# Patient Record
Sex: Female | Born: 1940 | Race: White | Hispanic: No | Smoking: Former smoker
Health system: Southern US, Community
[De-identification: ages and names within clinical notes are randomized; demographics above are authoritative.]

## PROBLEM LIST (undated history)

## (undated) DIAGNOSIS — F329 Major depressive disorder, single episode, unspecified: Secondary | ICD-10-CM

## (undated) DIAGNOSIS — K769 Liver disease, unspecified: Secondary | ICD-10-CM

## (undated) DIAGNOSIS — F0390 Unspecified dementia without behavioral disturbance: Secondary | ICD-10-CM

## (undated) DIAGNOSIS — J449 Chronic obstructive pulmonary disease, unspecified: Secondary | ICD-10-CM

## (undated) DIAGNOSIS — F32A Depression, unspecified: Secondary | ICD-10-CM

## (undated) DIAGNOSIS — I10 Essential (primary) hypertension: Secondary | ICD-10-CM

## (undated) DIAGNOSIS — N189 Chronic kidney disease, unspecified: Secondary | ICD-10-CM

## (undated) HISTORY — DX: Chronic obstructive pulmonary disease, unspecified: J44.9

## (undated) HISTORY — DX: Depression, unspecified: F32.A

## (undated) HISTORY — DX: Chronic kidney disease, unspecified: N18.9

## (undated) HISTORY — DX: Liver disease, unspecified: K76.9

## (undated) HISTORY — PX: OTHER SURGICAL HISTORY: SHX169

## (undated) HISTORY — PX: CHOLECYSTECTOMY: SHX55

## (undated) HISTORY — PX: ABDOMINAL HYSTERECTOMY: SHX81

---
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
## 2018-04-16 ENCOUNTER — Emergency Department: Payer: Medicare HMO

## 2018-04-16 ENCOUNTER — Emergency Department
Admission: EM | Admit: 2018-04-16 | Discharge: 2018-04-16 | Disposition: A | Discharge status: Home / Self Care | Payer: Medicare HMO | Attending: Emergency Medicine | Admitting: Emergency Medicine

## 2018-04-16 ENCOUNTER — Other Ambulatory Visit: Payer: Self-pay

## 2018-04-16 DIAGNOSIS — I10 Essential (primary) hypertension: Secondary | ICD-10-CM | POA: Diagnosis not present

## 2018-04-16 DIAGNOSIS — R079 Chest pain, unspecified: Secondary | ICD-10-CM

## 2018-04-16 HISTORY — DX: Essential (primary) hypertension: I10

## 2018-04-16 LAB — CBC WITH DIFFERENTIAL/PLATELET
Abs Immature Granulocytes: 0.01 10*3/uL (ref 0.00–0.07)
BASOS ABS: 0 10*3/uL (ref 0.0–0.1)
Basophils Relative: 1 %
Eosinophils Absolute: 0.4 10*3/uL (ref 0.0–0.5)
Eosinophils Relative: 7 %
HCT: 42.4 % (ref 36.0–46.0)
Hemoglobin: 14 g/dL (ref 12.0–15.0)
Immature Granulocytes: 0 %
Lymphocytes Relative: 36 %
Lymphs Abs: 2.2 10*3/uL (ref 0.7–4.0)
MCH: 31.6 pg (ref 26.0–34.0)
MCHC: 33 g/dL (ref 30.0–36.0)
MCV: 95.7 fL (ref 80.0–100.0)
Monocytes Absolute: 0.5 10*3/uL (ref 0.1–1.0)
Monocytes Relative: 8 %
Neutro Abs: 3 10*3/uL (ref 1.7–7.7)
Neutrophils Relative %: 48 %
Platelets: 261 10*3/uL (ref 150–400)
RBC: 4.43 MIL/uL (ref 3.87–5.11)
RDW: 12.7 % (ref 11.5–15.5)
WBC: 6.2 10*3/uL (ref 4.0–10.5)
nRBC: 0 % (ref 0.0–0.2)

## 2018-04-16 LAB — BASIC METABOLIC PANEL
Anion gap: 9 (ref 5–15)
BUN: 17 mg/dL (ref 8–23)
CHLORIDE: 96 mmol/L — AB (ref 98–111)
CO2: 32 mmol/L (ref 22–32)
Calcium: 9.1 mg/dL (ref 8.9–10.3)
Creatinine, Ser: 0.67 mg/dL (ref 0.44–1.00)
GFR calc Af Amer: >60 mL/min (ref >60)
GFR calc non Af Amer: >60 mL/min (ref >60)
Glucose, Bld: 81 mg/dL (ref 70–99)
Potassium: 3.6 mmol/L (ref 3.5–5.1)
Sodium: 137 mmol/L (ref 135–145)

## 2018-04-16 LAB — TROPONIN I: Troponin I: <0.03 ng/mL (ref <0.03)

## 2018-04-16 MED ORDER — AMLODIPINE BESYLATE 5 MG PO TABS: 5.0000 mg | ORAL_TABLET | Freq: Every day | ORAL | 0 refills | Status: DC

## 2018-04-16 MED ORDER — AMLODIPINE BESYLATE 5 MG PO TABS
2.5000 mg | ORAL_TABLET | Freq: Once | ORAL | Status: AC
  Administered 2018-04-16: 2.5 mg via ORAL
  Filled 2018-04-16: qty 1

## 2018-04-16 NOTE — ED Notes (Signed)
Report given to Cole RN 

## 2018-04-16 NOTE — ED Provider Notes (Signed)
[facility] Provider Note  ____________________________________________   First MD Initiated Contact with Patient 04/16/18 1547     (approximate)  I have reviewed the triage vital signs and the nursing notes.   HISTORY  Chief Complaint Chest Pain   HPI Samantha Carpenter is a 78 y.o. female with a history of hypertension was presented to the emergency department today complaining of chest pain radiating to the left shoulder as well as the left side of the neck and jaw.  She states that the pain was severe and felt like a pressure that lasted for approximately 5 to 10 minutes.  She says that she is still having pain now approximate 1 to 2 hours after the initial episode but the pain is a 1 or 2 at this time.  Given 324 mg of aspirin in route to the hospital.  No nitroglycerin.  No history of CAD but patient says that there is a history of CAD in her family.  Denies any associated nausea, vomiting or shortness of breath.  Denies any alleviating or worsening factors such as exertion or deep breathing.   Past Medical History:  Diagnosis Date  . Hypertension     There are no active problems to display for this patient.   History reviewed. No pertinent surgical history.  Prior to Admission medications   Not on File    Allergies Patient has no allergy information on record.  No family history on file.  Social History Social History   Tobacco Use  . Smoking status: Not on file  Substance Use Topics  . Alcohol use: Not on file  . Drug use: Not on file    Review of Systems  Constitutional: No fever/chills Eyes: No visual changes. ENT: No sore throat. Cardiovascular: As above Respiratory: Denies shortness of breath. Gastrointestinal: No abdominal pain.  No nausea, no vomiting.  No diarrhea.  No constipation. Genitourinary: Negative for dysuria. Musculoskeletal: Negative for back pain. Skin: Negative for rash. Neurological:  Negative for headaches, focal weakness or numbness.   ____________________________________________   PHYSICAL EXAM:  VITAL SIGNS: ED Triage Vitals [04/16/18 1552]  Enc Vitals Group     BP (!) 223/87     Pulse Rate 70     Resp 19     Temp 98 F (36.7 C)     Temp Source Oral     SpO2 100 %     Weight 190 lb (86.2 kg)     Height 5\' 4"  (1.626 m)     Head Circumference      Peak Flow      Pain Score 2     Pain Loc      Pain Edu?      Excl. in GC?     Constitutional: Alert and oriented. Well appearing and in no acute distress. Eyes: Conjunctivae are normal.  Head: Atraumatic. Nose: No congestion/rhinnorhea. Mouth/Throat: Mucous membranes are moist.  Neck: No stridor.   Cardiovascular: Normal rate, regular rhythm. Grossly normal heart sounds.  Good peripheral circulation with equal and bilateral radial pulses. Respiratory: Normal respiratory effort.  No retractions. Lungs CTAB. Gastrointestinal: Soft and nontender. No distention. No CVA tenderness. Musculoskeletal: No lower extremity tenderness nor edema.  No joint effusions. Neurologic:  Normal speech and language. No gross focal neurologic deficits are appreciated. Skin:  Skin is warm, dry and intact. No rash noted. Psychiatric: Mood and affect are normal. Speech and behavior are normal.  ____________________________________________   LABS (all labs ordered  are listed, but only abnormal results are displayed)  Labs Reviewed  BASIC METABOLIC PANEL - Abnormal; Notable for the following components:      Result Value   Chloride 96 (*)    All other components within normal limits  CBC WITH DIFFERENTIAL/PLATELET  TROPONIN I  TROPONIN I   ____________________________________________  EKG  ED ECG REPORT I, Arelia Longest, the attending physician, personally viewed and interpreted this ECG.   Date: 04/16/2018  EKG Time: 1548  Rate: 69  Rhythm: normal sinus rhythm  Axis: Normal  Intervals:none  ST&T Change: No  ST segment elevation or depression.  T wave inversion in V2 as well as aVL. No previous on record for comparison ____________________________________________  RADIOLOGY  No active disease on chest x-ray. ____________________________________________   PROCEDURES  Procedure(s) performed:   Procedures  Critical Care performed:   ____________________________________________   INITIAL IMPRESSION / ASSESSMENT AND PLAN / ED COURSE  Pertinent labs & imaging results that were available during my care of the patient were reviewed by me and considered in my medical decision making (see chart for details).  Differential diagnosis includes, but is not limited to, ACS, aortic dissection, pulmonary embolism, cardiac tamponade, pneumothorax, pneumonia, pericarditis, myocarditis, GI-related causes including esophagitis/gastritis, and musculoskeletal chest wall pain.   As part of my medical decision making, I reviewed the following data within the electronic MEDICAL RECORD NUMBER Notes from prior ED visits  ----------------------------------------- 7:34 PM on 04/16/2018 -----------------------------------------  Patient at this time remains chest pain-free.  No dizziness.  However, blood pressure elevated to the 200s over 100s.  Patient says that she was just started several months ago on an ACE inhibitor as well as amlodipine 2.5 mg.  States that at that time her blood pressure was approximate 180 systolic.  Has not checked her blood pressure since then.  We will change her amlodipine to 5 mg and keep her ACE inhibitor at its same dose.  Recommended follow-up with primary care here as well as cardiology.  Patient understanding the diagnosis well treatment and willing to comply.  29- troponins here in the emergency department and remains asymptomatic. ____________________________________________   FINAL CLINICAL IMPRESSION(S) / ED DIAGNOSES  Hypertension.  Chest pain.  NEW MEDICATIONS STARTED  DURING THIS VISIT:  New Prescriptions   No medications on file     Note:  This document was prepared using Dragon voice recognition software and may include unintentional dictation errors.     Myrna Blazer, MD 04/16/18 [phone]

## 2018-04-16 NOTE — ED Notes (Signed)
Family at bedside and updated on plan of care for pt.

## 2018-04-16 NOTE — ED Triage Notes (Signed)
Pt comes via [facility] from shopping with c/o chest pain. Pt was out shopping and began to have some central chest pain that felt like pressure. Pt states it radiated to her left shoulder and jaw.  Pt stated 8/10 pain but was later 2/10. EMS reports pt was hypertensive.  Pt arrives alert. MD at bedside

## 2018-04-16 NOTE — ED Notes (Signed)
2nd trop sent to lab

## 2018-04-28 DIAGNOSIS — F3341 Major depressive disorder, recurrent, in partial remission: Secondary | ICD-10-CM | POA: Insufficient documentation

## 2018-04-28 DIAGNOSIS — F0390 Unspecified dementia without behavioral disturbance: Secondary | ICD-10-CM | POA: Insufficient documentation

## 2018-04-28 DIAGNOSIS — N183 Chronic kidney disease, stage 3 unspecified: Secondary | ICD-10-CM | POA: Insufficient documentation

## 2018-04-28 DIAGNOSIS — J431 Panlobular emphysema: Secondary | ICD-10-CM | POA: Insufficient documentation

## 2018-04-28 DIAGNOSIS — N1831 Chronic kidney disease, stage 3a: Secondary | ICD-10-CM | POA: Insufficient documentation

## 2018-04-28 DIAGNOSIS — I10 Essential (primary) hypertension: Secondary | ICD-10-CM | POA: Insufficient documentation

## 2018-06-09 DIAGNOSIS — E7800 Pure hypercholesterolemia, unspecified: Secondary | ICD-10-CM | POA: Insufficient documentation

## 2018-06-30 ENCOUNTER — Ambulatory Visit: Payer: Self-pay | Admitting: Psychiatry

## 2018-08-06 ENCOUNTER — Other Ambulatory Visit (HOSPITAL_COMMUNITY): Payer: Self-pay | Admitting: Neurology

## 2018-08-06 ENCOUNTER — Other Ambulatory Visit: Payer: Self-pay | Admitting: Neurology

## 2018-08-06 DIAGNOSIS — R413 Other amnesia: Secondary | ICD-10-CM

## 2018-08-15 ENCOUNTER — Ambulatory Visit: Payer: Self-pay

## 2018-08-22 ENCOUNTER — Ambulatory Visit: Payer: Self-pay

## 2018-08-26 ENCOUNTER — Ambulatory Visit: Payer: Self-pay

## 2018-09-01 ENCOUNTER — Ambulatory Visit
Admission: RE | Admit: 2018-09-01 | Discharge: 2018-09-01 | Disposition: A | Discharge status: Home / Self Care | Payer: Medicare HMO | Source: Ambulatory Visit | Attending: Neurology | Admitting: Neurology

## 2018-09-01 ENCOUNTER — Other Ambulatory Visit: Payer: Self-pay

## 2018-09-01 DIAGNOSIS — R413 Other amnesia: Secondary | ICD-10-CM | POA: Insufficient documentation

## 2018-09-02 ENCOUNTER — Encounter: Payer: Self-pay | Admitting: Psychiatry

## 2018-09-02 ENCOUNTER — Ambulatory Visit (INDEPENDENT_AMBULATORY_CARE_PROVIDER_SITE_OTHER): Payer: Medicare HMO | Admitting: Psychiatry

## 2018-09-02 DIAGNOSIS — F3341 Major depressive disorder, recurrent, in partial remission: Secondary | ICD-10-CM

## 2018-09-02 DIAGNOSIS — F09 Unspecified mental disorder due to known physiological condition: Secondary | ICD-10-CM
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
## 2018-10-09 ENCOUNTER — Telehealth (HOSPITAL_COMMUNITY): Payer: Self-pay | Admitting: Licensed Clinical Social Worker

## 2018-10-09 ENCOUNTER — Ambulatory Visit (HOSPITAL_COMMUNITY): Payer: Medicare HMO | Admitting: Licensed Clinical Social Worker

## 2018-10-09 NOTE — Telephone Encounter (Signed)
Samantha Carpenter did get a hold of the 10am patient. She stated she "did not know she had an appointment, she does not know who Dr. Shea Evans is, and she does not agree with there being a need for her to talk to anyone about anything."  Patient declined visit for today.

## 2018-11-13 ENCOUNTER — Other Ambulatory Visit: Payer: Self-pay | Admitting: Physician Assistant

## 2018-11-13 DIAGNOSIS — M5440 Lumbago with sciatica, unspecified side: Secondary | ICD-10-CM

## 2018-11-13 DIAGNOSIS — M79605 Pain in left leg: Secondary | ICD-10-CM

## 2018-11-13 DIAGNOSIS — S32030S Wedge compression fracture of third lumbar vertebra, sequela: Secondary | ICD-10-CM

## 2018-11-26 ENCOUNTER — Ambulatory Visit: Payer: Medicare HMO

## 2018-12-31 ENCOUNTER — Other Ambulatory Visit: Payer: Self-pay | Admitting: Internal Medicine

## 2018-12-31 DIAGNOSIS — R1084 Generalized abdominal pain: Secondary | ICD-10-CM

## 2019-05-10 ENCOUNTER — Emergency Department: Payer: Medicare HMO

## 2019-05-10 ENCOUNTER — Other Ambulatory Visit: Payer: Self-pay

## 2019-05-10 ENCOUNTER — Emergency Department
Admission: EM | Admit: 2019-05-10 | Discharge: 2019-05-11 | Disposition: A | Discharge status: Home / Self Care | Payer: Medicare HMO | Attending: Emergency Medicine | Admitting: Emergency Medicine

## 2019-05-10 DIAGNOSIS — M545 Low back pain: Secondary | ICD-10-CM | POA: Diagnosis present

## 2019-05-10 DIAGNOSIS — M5441 Lumbago with sciatica, right side: Secondary | ICD-10-CM | POA: Diagnosis not present

## 2019-05-10 DIAGNOSIS — Z87891 Personal history of nicotine dependence: Secondary | ICD-10-CM | POA: Diagnosis not present

## 2019-05-10 DIAGNOSIS — Z79899 Other long term (current) drug therapy: Secondary | ICD-10-CM | POA: Insufficient documentation

## 2019-05-10 DIAGNOSIS — J449 Chronic obstructive pulmonary disease, unspecified: Secondary | ICD-10-CM | POA: Diagnosis not present

## 2019-05-10 DIAGNOSIS — N183 Chronic kidney disease, stage 3 unspecified: Secondary | ICD-10-CM | POA: Insufficient documentation

## 2019-05-10 DIAGNOSIS — Z91040 Latex allergy status: Secondary | ICD-10-CM | POA: Diagnosis not present

## 2019-05-10 DIAGNOSIS — Z7982 Long term (current) use of aspirin: Secondary | ICD-10-CM | POA: Insufficient documentation

## 2019-05-10 DIAGNOSIS — F0390 Unspecified dementia without behavioral disturbance: Secondary | ICD-10-CM | POA: Diagnosis not present

## 2019-05-10 DIAGNOSIS — I129 Hypertensive chronic kidney disease with stage 1 through stage 4 chronic kidney disease, or unspecified chronic kidney disease: Secondary | ICD-10-CM | POA: Insufficient documentation

## 2019-05-10 DIAGNOSIS — M5442 Lumbago with sciatica, left side: Secondary | ICD-10-CM

## 2019-05-10 MED ORDER — KETOROLAC TROMETHAMINE 30 MG/ML IJ SOLN
10.0000 mg | Freq: Once | INTRAMUSCULAR | Status: AC
  Administered 2019-05-10: 9.9 mg via INTRAVENOUS
  Filled 2019-05-10: qty 1

## 2019-05-10 MED ORDER — MORPHINE SULFATE (PF) 2 MG/ML IV SOLN
INTRAVENOUS | Status: AC
  Filled 2019-05-10: qty 1

## 2019-05-10 MED ORDER — MORPHINE SULFATE (PF) 2 MG/ML IV SOLN
2.0000 mg | Freq: Once | INTRAVENOUS | Status: AC
  Administered 2019-05-10: 23:00:00 2 mg via INTRAVENOUS

## 2019-05-10 MED ORDER — HYDROCODONE-ACETAMINOPHEN 5-325 MG PO TABS
1.0000 | ORAL_TABLET | Freq: Once | ORAL | Status: DC
  Filled 2019-05-10: qty 1

## 2019-05-10 NOTE — ED Triage Notes (Addendum)
Patient coming [facility] from home for lower back and left hip pain X 2-3 days. Patient describes pain as sharp/shooting going down leg. Patient has hx of multiple falls, last fall occurring 2-3 weeks ago. Patients 25 mcg fentanyl in transit. Patient's pain decreased form 10 to 5 of 10 after fentanyl. Patient hypertensive for EMS, systolic over 200.   Patient reports she just recently moved and has been lifting heavy objects (computers). Patient reports previous hx of back pain similar to this after lifting heavy objects in the past.

## 2019-05-10 NOTE — ED Notes (Signed)
Pt to DG  Pt refused norco d/t "having to go in to the hospital to get off of it"  Pt agrees to asking EDP for something else  Dr Dolores Frame notifed

## 2019-05-10 NOTE — ED Provider Notes (Signed)
[facility] Emergency Department Provider Note   ____________________________________________   First MD Initiated Contact with Patient 05/10/19 2340     (approximate)  I have reviewed the triage vital signs and the nursing notes.   HISTORY  Chief Complaint Back Pain    HPI Samantha Carpenter is a 79 y.o. female brought to the ED from home via EMS with a chief complaint of lower back and left hip pain x2 to 3 days.  Patient denies recent fall/trauma/injury.  States she did fall 3 weeks ago but "it had nothing to do with my back".  Reports she has been moving and lifting heavy boxes.  Describes pain starting in the lower back shooting down her left lower leg.  Denies associated extremity weakness, numbness/tingling, bowel or bladder incontinence.  Given fentanyl in route which improved her pain from 10/10 to 5/10.  Denies anticoagulant use.       Past Medical History:  Diagnosis Date  . Chronic kidney disease   . COPD (chronic obstructive pulmonary disease) ([place])   . Depression   . Hypertension   . Liver disease     Patient Active Problem List   Diagnosis Date Noted  . Pure hypercholesterolemia 06/09/2018  . Recurrent major depressive disorder, in partial remission ([place]) 04/28/2018  . Panlobular emphysema ([place]) 04/28/2018  . HTN, goal below 140/80 04/28/2018  . Dementia, senile, without behavioral disturbance ([place]) 04/28/2018  . CKD (chronic kidney disease) stage 3, GFR 30-59 ml/min 04/28/2018    Past Surgical History:  Procedure Laterality Date  . ABDOMINAL HYSTERECTOMY    . CHOLECYSTECTOMY    . face tuck      Prior to Admission medications   Medication Sig Start Date End Date Taking? Authorizing Provider  acyclovir (ZOVIRAX) 800 MG tablet Take by mouth. 06/18/18   [provider]  amLODipine (NORVASC) 5 MG tablet Take 1 tablet (5 mg total) by mouth daily for 30 days. 04/16/18 05/16/18  Schaevitz, Randall An, MD  aspirin EC 81 MG  tablet Take by mouth.    [provider]  bisoprolol (ZEBETA) 5 MG tablet Take by mouth. 04/28/18 04/28/19  [provider]  clonazePAM Bobbye Charleston) 1 MG tablet Take by mouth. 05/08/18   [provider]  clotrimazole-betamethasone (LOTRISONE) cream Apply topically. 08/29/18 08/29/19  [provider]  DULoxetine (CYMBALTA) 60 MG capsule Take by mouth. 08/25/18   [provider]  gabapentin (NEURONTIN) 100 MG capsule Take by mouth. 06/18/18   [provider]  irbesartan (AVAPRO) 300 MG tablet Take by mouth. 04/28/18 04/28/19  [provider]  memantine (NAMENDA) 10 MG tablet Take by mouth. 06/18/18   [provider]  OXcarbazepine (TRILEPTAL) 150 MG tablet 1 po qam qnd 2 po qhs 06/12/18   [provider]    Allergies Morphine and related and Latex  Family History  Problem Relation Age of Onset  . Bipolar disorder Brother   . Schizophrenia Brother   . Bipolar disorder Sister   . Schizophrenia Sister   . Drug abuse Son     Social History Social History   Tobacco Use  . Smoking status: Former Smoker    Quit date: 09/01/2009    Years since quitting: 9.6  . Smokeless tobacco: Never Used  Substance Use Topics  . Alcohol use: Never  . Drug use: Not Currently    Review of Systems  Constitutional: No fever/chills Eyes: No visual changes. ENT: No sore throat. Cardiovascular: Denies chest pain. Respiratory: Denies shortness  of breath. Gastrointestinal: No abdominal pain.  No nausea, no vomiting.  No diarrhea.  No constipation. Genitourinary: Negative for dysuria. Musculoskeletal: Positive for back pain. Skin: Negative for rash. Neurological: Negative for headaches, focal weakness or numbness.   ____________________________________________   PHYSICAL EXAM:  VITAL SIGNS: ED Triage Vitals  Enc Vitals Group     BP 05/10/19 2154 (!) 191/58     Pulse Rate 05/10/19 2154 (!) 58     Resp 05/10/19 2154 17     Temp  05/10/19 2154 97.6 F (36.4 C)     Temp src --      SpO2 05/10/19 2154 95 %     Weight 05/10/19 2149 190 lb 0.6 oz (86.2 kg)     Height 05/10/19 2149 5\' 4"  (1.626 m)     Head Circumference --      Peak Flow --      Pain Score 05/10/19 2149 5     Pain Loc --      Pain Edu? --      Excl. in GC? --     Constitutional: Alert and oriented. Well appearing and in mild acute distress. Eyes: Conjunctivae are normal. PERRL. EOMI. Head: Atraumatic. Nose: Atraumatic. Mouth/Throat: Mucous membranes are moist.  Atraumatic. Neck: No stridor.  No cervical spine tenderness to palpation. Cardiovascular: Normal rate, regular rhythm. Grossly normal heart sounds.  Good peripheral circulation. Respiratory: Normal respiratory effort.  No retractions. Lungs CTAB. Gastrointestinal: Soft and nontender to light or deep palpation. No distention. No abdominal bruits. No CVA tenderness. Musculoskeletal: No spinal tenderness to palpation.  Left buttocks tender to palpation.  Negative straight leg raise.  No lower extremity tenderness nor edema.  No joint effusions. Neurologic:  Normal speech and language. No gross focal neurologic deficits are appreciated.  5/5 motor strength and sensation all extremities. Skin:  Skin is warm, dry and intact. No rash noted. Psychiatric: Mood and affect are normal. Speech and behavior are normal.  ____________________________________________   LABS (all labs ordered are listed, but only abnormal results are displayed)  Labs Reviewed  URINALYSIS, COMPLETE (UACMP) WITH MICROSCOPIC   ____________________________________________  EKG  None ____________________________________________  RADIOLOGY  ED MD interpretation: No acute bony abnormalities  Official radiology report(s): DG Lumbar Spine Complete  Result Date: 05/11/2019 CLINICAL DATA:  Lower back pain for 3 days, lower extremity radiculopathy, multiple falls EXAM: LUMBAR SPINE - COMPLETE 4+ VIEW COMPARISON:   11/04/2018 report only FINDINGS: Frontal, bilateral oblique, and lateral views of the lumbar spine are obtained. Chronic compression deformity superior endplate L3 with less than 25% loss of height. No acute bony abnormalities. Prominent spondylosis at L4-5 and L5-S1, with associated facet hypertrophic changes at those levels. Alignment is grossly anatomic.  Sacroiliac joints are normal. IMPRESSION: 1. Chronic L3 compression deformity. 2. No acute bony abnormalities. 3. Prominent lower lumbar spondylosis greatest at the lumbosacral junction. Electronically Signed   By: 11/06/2018 M.D.   On: 05/11/2019 00:21    ____________________________________________   PROCEDURES  Procedure(s) performed (including Critical Care):  Procedures   ____________________________________________   INITIAL IMPRESSION / ASSESSMENT AND PLAN / ED COURSE  As part of my medical decision making, I reviewed the following data within the electronic MEDICAL RECORD NUMBER Nursing notes reviewed and incorporated, Old chart reviewed, Radiograph reviewed, Notes from prior ED visits and [place] Controlled Substance Database     Samantha Carpenter was evaluated in Emergency Department on 05/11/2019 for the symptoms described in the history of present illness. She was evaluated  in the context of the global COVID-19 pandemic, which necessitated consideration that the patient might be at risk for infection with the SARS-CoV-2 virus that causes COVID-19. Institutional protocols and algorithms that pertain to the evaluation of patients at risk for COVID-19 are in a state of rapid change based on information released by regulatory bodies including the CDC and federal and state organizations. These policies and algorithms were followed during the patient's care in the ED.    79 year old female who presents with low back/left hip pain consistent with sciatica.  Will administer low-dose NSAID, oral analgesic.  X-ray lower back and  reassess.  Clinical Course as of May 11 47  Mon May 11, 2019  0932 Updated patient on x-ray results.  Patient feeling better.  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  We will also apply lidocaine patch.  Reading her chart, it looks like her PCP filled her prescription for Mobic this month.  Instructed her to continue taking Mobic for anti-inflammatory properties.  Will follow-up with orthopedics as needed.  Strict return precautions given.  Patient verbalizes understanding and agrees with plan of care.   [JS]    Clinical Course User Index [JS] Irean Hong, MD     ____________________________________________   FINAL CLINICAL IMPRESSION(S) / ED DIAGNOSES  Final diagnoses:  Acute midline low back pain with left-sided sciatica     ED Discharge Orders    None       Note:  This document was prepared using Dragon voice recognition software and may include unintentional dictation errors.   Irean Hong, MD 05/11/19 [phone]

## 2019-05-10 NOTE — ED Notes (Signed)
Pt reports pain since 1973, but the pills that pt has been taking over the last 24 hours hasn't helped the pain in the left hip, pain is "running down to the left foot"  10/10 pain in left hip "feels like it's breaking when I walk on it"  "swelling in my left knee" - left upper outer knee appears swollen  Creme, patches, "8 hour arthritis pain medicine"

## 2019-05-10 NOTE — ED Notes (Signed)
Pt given pillow and warm blanket; pt reports "other hospitals put my pocketbook in a safe", pt asked to keep pocketbook by her side for security and simplicity

## 2019-05-11 MED ORDER — TRAMADOL HCL 50 MG PO TABS
50.0000 mg | ORAL_TABLET | Freq: Once | ORAL | Status: AC
  Administered 2019-05-11: 50 mg via ORAL
  Filled 2019-05-11: qty 1

## 2019-05-11 MED ORDER — ONDANSETRON HCL 4 MG/2ML IJ SOLN
4.0000 mg | Freq: Once | INTRAMUSCULAR | Status: AC
  Administered 2019-05-11: 02:00:00 4 mg via INTRAVENOUS
  Filled 2019-05-11: qty 2

## 2019-05-11 MED ORDER — LIDOCAINE 5 % EX PTCH: 1.0000 | MEDICATED_PATCH | CUTANEOUS | 0 refills | Status: DC

## 2019-05-11 MED ORDER — LIDOCAINE 5 % EX PTCH
1.0000 | MEDICATED_PATCH | CUTANEOUS | Status: DC
  Administered 2019-05-11: 1 via TRANSDERMAL
  Filled 2019-05-11: qty 1

## 2019-05-11 MED ORDER — TRAMADOL HCL 50 MG PO TABS: 50.0000 mg | ORAL_TABLET | Freq: Four times a day (QID) | ORAL | 0 refills | Status: DC | PRN

## 2019-05-11 NOTE — ED Notes (Signed)
Pt returned from DG.  

## 2019-05-11 NOTE — Discharge Instructions (Addendum)
1.  You may take Tramadol as needed for pain. 2.  Apply Lidocaine patch to affected area as instructed. 3.  You may alternate heat or ice as needed for relief of symptoms. 4.  Use your walker to help you balance as you walk. 5.  Return to the ER for worsening symptoms, persistent vomiting, difficulty breathing or other concerns.

## 2019-05-11 NOTE — ED Notes (Signed)
Peripheral IV discontinued. Catheter intact. No signs of infiltration or redness. Gauze applied to IV site.    Discharge instructions reviewed with patient. Questions fielded by this RN. Patient verbalizes understanding of instructions. Patient discharged home in stable condition per sung. No acute distress noted at time of discharge.   Pt leaving with EMS on stretcher

## 2019-05-11 NOTE — ED Notes (Signed)
Alternate transport called for but not available

## 2019-05-11 NOTE — ED Notes (Addendum)
Pt reports sister sleeping and neighbors watching pet; requests EMS to return home; EDP notified  Pt to door for toilet use; curtain closed and pt toileted alone; ambulatory in room without assistance

## 2019-05-27 ENCOUNTER — Other Ambulatory Visit: Payer: Self-pay | Admitting: Internal Medicine

## 2019-05-27 DIAGNOSIS — S32030A Wedge compression fracture of third lumbar vertebra, initial encounter for closed fracture: Secondary | ICD-10-CM

## 2019-05-28 ENCOUNTER — Other Ambulatory Visit: Payer: Self-pay

## 2019-05-28 ENCOUNTER — Encounter: Payer: Self-pay | Admitting: Emergency Medicine

## 2019-05-28 ENCOUNTER — Emergency Department
Admission: EM | Admit: 2019-05-28 | Discharge: 2019-05-29 | Disposition: A | Discharge status: Home / Self Care | Payer: Medicare HMO | Attending: Emergency Medicine | Admitting: Emergency Medicine

## 2019-05-28 ENCOUNTER — Emergency Department: Payer: Medicare HMO

## 2019-05-28 DIAGNOSIS — Z7982 Long term (current) use of aspirin: Secondary | ICD-10-CM | POA: Insufficient documentation

## 2019-05-28 DIAGNOSIS — R0602 Shortness of breath: Secondary | ICD-10-CM | POA: Diagnosis not present

## 2019-05-28 DIAGNOSIS — N183 Chronic kidney disease, stage 3 unspecified: Secondary | ICD-10-CM | POA: Diagnosis not present

## 2019-05-28 DIAGNOSIS — F0390 Unspecified dementia without behavioral disturbance: Secondary | ICD-10-CM | POA: Diagnosis not present

## 2019-05-28 DIAGNOSIS — Z79899 Other long term (current) drug therapy: Secondary | ICD-10-CM | POA: Diagnosis not present

## 2019-05-28 DIAGNOSIS — I129 Hypertensive chronic kidney disease with stage 1 through stage 4 chronic kidney disease, or unspecified chronic kidney disease: Secondary | ICD-10-CM | POA: Diagnosis not present

## 2019-05-28 DIAGNOSIS — M79605 Pain in left leg: Secondary | ICD-10-CM

## 2019-05-28 DIAGNOSIS — Z87891 Personal history of nicotine dependence: Secondary | ICD-10-CM | POA: Diagnosis not present

## 2019-05-28 DIAGNOSIS — Z91040 Latex allergy status: Secondary | ICD-10-CM | POA: Diagnosis not present

## 2019-05-28 DIAGNOSIS — J449 Chronic obstructive pulmonary disease, unspecified: Secondary | ICD-10-CM | POA: Diagnosis not present

## 2019-05-28 LAB — CBC WITH DIFFERENTIAL/PLATELET
Abs Immature Granulocytes: 0.02 10*3/uL (ref 0.00–0.07)
Basophils Absolute: 0 10*3/uL (ref 0.0–0.1)
Basophils Relative: 1 %
Eosinophils Absolute: 0.4 10*3/uL (ref 0.0–0.5)
Eosinophils Relative: 5 %
HCT: 41.9 % (ref 36.0–46.0)
Hemoglobin: 13.9 g/dL (ref 12.0–15.0)
Immature Granulocytes: 0 %
Lymphocytes Relative: 21 %
Lymphs Abs: 1.5 10*3/uL (ref 0.7–4.0)
MCH: 32.1 pg (ref 26.0–34.0)
MCHC: 33.2 g/dL (ref 30.0–36.0)
MCV: 96.8 fL (ref 80.0–100.0)
Monocytes Absolute: 0.4 10*3/uL (ref 0.1–1.0)
Monocytes Relative: 5 %
Neutro Abs: 5 10*3/uL (ref 1.7–7.7)
Neutrophils Relative %: 68 %
Platelets: 280 10*3/uL (ref 150–400)
RBC: 4.33 MIL/uL (ref 3.87–5.11)
RDW: 12.5 % (ref 11.5–15.5)
WBC: 7.3 10*3/uL (ref 4.0–10.5)
nRBC: 0 % (ref 0.0–0.2)

## 2019-05-28 LAB — BASIC METABOLIC PANEL
Anion gap: 8 (ref 5–15)
BUN: 13 mg/dL (ref 8–23)
CO2: 27 mmol/L (ref 22–32)
Calcium: 8.5 mg/dL — ABNORMAL LOW (ref 8.9–10.3)
Chloride: 104 mmol/L (ref 98–111)
Creatinine, Ser: 0.7 mg/dL (ref 0.44–1.00)
GFR calc Af Amer: >60 mL/min (ref >60)
GFR calc non Af Amer: >60 mL/min (ref >60)
Glucose, Bld: 151 mg/dL — ABNORMAL HIGH (ref 70–99)
Potassium: 3.6 mmol/L (ref 3.5–5.1)
Sodium: 139 mmol/L (ref 135–145)

## 2019-05-28 LAB — TROPONIN I (HIGH SENSITIVITY)
Troponin I (High Sensitivity): 10 ng/L (ref <18)
Troponin I (High Sensitivity): 18 ng/L — ABNORMAL HIGH (ref <18)

## 2019-05-28 MED ORDER — TRAMADOL HCL 50 MG PO TABS
50.0000 mg | ORAL_TABLET | Freq: Once | ORAL | Status: DC
  Filled 2019-05-28: qty 1

## 2019-05-28 MED ORDER — ACETAMINOPHEN 500 MG PO TABS
1000.0000 mg | ORAL_TABLET | Freq: Once | ORAL | Status: AC
  Administered 2019-05-28: 1000 mg via ORAL

## 2019-05-28 MED ORDER — HALOPERIDOL LACTATE 5 MG/ML IJ SOLN
2.0000 mg | Freq: Once | INTRAMUSCULAR | Status: AC
  Administered 2019-05-28: 2 mg via INTRAVENOUS
  Filled 2019-05-28: qty 1

## 2019-05-28 MED ORDER — ACETAMINOPHEN 500 MG PO TABS
ORAL_TABLET | ORAL | Status: AC
  Filled 2019-05-28: qty 2

## 2019-05-28 NOTE — ED Notes (Signed)
MD at bedside. 

## 2019-05-28 NOTE — ED Notes (Signed)
purwick placed on pt 

## 2019-05-28 NOTE — ED Notes (Signed)
Pt c/o left leg pain. Pt states pain radiates down to the left leg from the lumber spine. Pt states leg has been hurting for 3 or 4 weeks. Pt denies falling or injury. Pt states she has been taking her pain meds from ED visit 2 or 3 weeks ago and only had a couple of pills left and pharmacy told her to call 911 or go to walkin clinic.

## 2019-05-28 NOTE — ED Notes (Signed)
Pt up to use room toilet. Pt moain in pain when askes what hurts pt stated "a lot" pt back in bed resting

## 2019-05-28 NOTE — ED Triage Notes (Addendum)
Pt arrival via [facility] from home. Pt called out due to chest pain, but when EMS arrived patient denies chest pain and instead is currently having leg pain and hip pain from a fall a few days ago.   When asked why the patient was brought here, patient states she 'might be having a heart attack or stroke' and that she 'just doesn't feel right'. Pt is a&4 but states she has dementia.  EMS vitals- BP 226/91 BS- 274

## 2019-05-28 NOTE — ED Notes (Signed)
Pts IV removed, catheter intact. Pt requiring EMS transport home, pts sister called but states she is unable to transport pt. Pt states 2 weeks ago she required ems transportation home as well, pt aware of cost. Pt a&o x4 at time of discharge, pt ambulating to bedside toilet at this time

## 2019-05-28 NOTE — ED Notes (Signed)
Pt moaning and calling out in pain, MD made aware. Pt provided with sisters phone number to contact family member

## 2019-05-28 NOTE — ED Provider Notes (Signed)
[facility] Emergency Department Provider Note  ____________________________________________   I have reviewed the triage vital signs and the nursing notes.   HISTORY  Chief Complaint Shortness of breath  History limited by: Not Limited   HPI Samantha Carpenter is a 79 y.o. female who presents to the emergency department today because of concern for shortness of breath. The patient states she was on the phone with the pharmacy inquiring about refill for her narcotic pain medication that was prescribed for left leg pain, when she started having shortness of breath. Apparently she also complained of chest pain to the pharmacist over the phone. At that time they recommended the patient come to the emergency department. At the time of my exam the patient denies any chest pain or shortness of breath. She does have complaint of some leg pain which she says is being worked up as an outpatient.    Records reviewed. Per medical record review patient has a history of COPD, depression, HTN. ER visit last month for left sided leg pain. Has followed up with PCP who has ordered MRI.   Past Medical History:  Diagnosis Date  . Chronic kidney disease   . COPD (chronic obstructive pulmonary disease) ([facility])   . Depression   . Hypertension   . Liver disease     Patient Active Problem List   Diagnosis Date Noted  . Pure hypercholesterolemia 06/09/2018  . Recurrent major depressive disorder, 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  . 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 HTN, goal below 140/80 04/28/2018  . Dementia, senile, without behavioral disturbance ([facility]) 04/28/2018  . CKD (chronic kidney disease) stage 3, GFR 30-59 ml/min 04/28/2018    Past Surgical History:  Procedure Laterality Date  . ABDOMINAL HYSTERECTOMY    . CHOLECYSTECTOMY    . face tuck      Prior to Admission medications   Medication Sig Start Date End Date Taking? Authorizing Provider  acyclovir  (ZOVIRAX) 800 MG tablet Take by mouth. 06/18/18   [provider]  amLODipine (NORVASC) 5 MG tablet Take 1 tablet (5 mg total) by mouth daily for 30 days. 04/16/18 05/16/18  Schaevitz, Myra Rude, MD  aspirin EC 81 MG tablet Take by mouth.    [provider]  bisoprolol (ZEBETA) 5 MG tablet Take by mouth. 04/28/18 04/28/19  [provider]  clonazePAM Scarlette Calico) 1 MG tablet Take by mouth. 05/08/18   [provider]  clotrimazole-betamethasone (LOTRISONE) cream Apply topically. 08/29/18 08/29/19  [provider]  DULoxetine (CYMBALTA) 60 MG capsule Take by mouth. 08/25/18   [provider]  gabapentin (NEURONTIN) 100 MG capsule Take by mouth. 06/18/18   [provider]  irbesartan (AVAPRO) 300 MG tablet Take by mouth. 04/28/18 04/28/19  [provider]  lidocaine (LIDODERM) 5 % Place 1 patch onto the skin daily. Remove & Discard patch within 12 hours or as directed by MD 05/11/19   Irean Hong, MD  memantine (NAMENDA) 10 MG tablet Take by mouth. 06/18/18   [provider]  OXcarbazepine (TRILEPTAL) 150 MG tablet 1 po qam qnd 2 po qhs 06/12/18   [provider]  traMADol (ULTRAM) 50 MG tablet Take 1 tablet (50 mg total) by mouth every 6 (six) hours as needed. 05/11/19   Irean Hong, MD    Allergies Morphine and related and Latex  Family History  Problem Relation Age of Onset  . Bipolar disorder Brother   . Schizophrenia Brother   . Bipolar disorder  Sister   . Schizophrenia Sister   . Drug abuse Son     Social History Social History   Tobacco Use  . Smoking status: Former Smoker    Quit date: 09/01/2009    Years since quitting: 9.7  . Smokeless tobacco: Never Used  Substance Use Topics  . Alcohol use: Never  . Drug use: Not Currently    Review of Systems Constitutional: No fever/chills Eyes: No visual changes. ENT: No sore throat. Cardiovascular: Positive for chest pain now resolved Respiratory: Positive  for shortness of breath now resolved.  Gastrointestinal: No abdominal pain.  No nausea, no vomiting.  No diarrhea.   Genitourinary: Negative for dysuria. Musculoskeletal: Positive for left leg pain. Skin: Negative for rash. Neurological: Negative for headaches, focal weakness or numbness.  ____________________________________________   PHYSICAL EXAM:  VITAL SIGNS: ED Triage Vitals  Enc Vitals Group     BP 05/28/19 1822 (!) 197/73     Pulse Rate 05/28/19 1822 71     Resp 05/28/19 1822 15     Temp 05/28/19 1820 98.6 F (37 C)     Temp Source 05/28/19 1820 Oral     SpO2 05/28/19 1822 100 %     Weight 05/28/19 1820 189 lb 9.5 oz (86 kg)     Height 05/28/19 1820 5\' 4"  (1.626 m)     Head Circumference --      Peak Flow --      Pain Score 05/28/19 1820 10   Constitutional: Alert and oriented.  Eyes: Conjunctivae are normal.  ENT      Head: Normocephalic and atraumatic.      Nose: No congestion/rhinnorhea.      Mouth/Throat: Mucous membranes are moist.      Neck: No stridor. Hematological/Lymphatic/Immunilogical: No cervical lymphadenopathy. Cardiovascular: Normal rate, regular rhythm.  No murmurs, rubs, or gallops.  Respiratory: Normal respiratory effort without tachypnea nor retractions. Breath sounds are clear and equal bilaterally. No wheezes/rales/rhonchi. Gastrointestinal: Soft and non tender. No rebound. No guarding.  Genitourinary: Deferred Musculoskeletal: Some discomfort with movement of the left leg.  Neurologic:  Normal speech and language. No gross focal neurologic deficits are appreciated.  Skin:  Skin is warm, dry and intact. No rash noted. Psychiatric: Mood and affect are normal. Speech and behavior are normal. Patient exhibits appropriate insight and judgment.  ____________________________________________    LABS (pertinent positives/negatives)  Trop hs 10 ->18 BMP wnl except glu 151, ca 8.5 CBC wbc 7.3, hgb 13.9, plt  280  ____________________________________________   EKG  I, 07/28/19, attending physician, personally viewed and interpreted this EKG  EKG Time: 1820 Rate: 74 Rhythm: sinus rhythm Axis: normal Intervals: qtc 463 QRS: narrow ST changes: no st elevation Impression: normal ekg ____________________________________________    RADIOLOGY  CXR Chronic hyperinflation and coarse interstitial markings. Atelectasis  ____________________________________________   PROCEDURES  Procedures  ____________________________________________   INITIAL IMPRESSION / ASSESSMENT AND PLAN / ED COURSE  Pertinent labs & imaging results that were available during my care of the patient were reviewed by me and considered in my medical decision making (see chart for details).   Patient presented to the emergency department today because of concerns for shortness of breath.  She states that this occurred while she was talking on the phone with the pharmacist about her narcotic medication.  By the time my exam the patient denies any shortness of breath.  She also denies any chest pain although states she says she told the pharmacist about chest pain.  EKG here without concerning findings.  Troponin was checked twice and went from 10-18.  Will I do have a low suspicion for cardiac etiology given the patient's history I did discuss this finding with the patient.  I did offer to keep patient here in the hospital for further work-up.  However the patient stated she would like to be discharged home.  Given that the patient is completely asymptomatic in terms of any shortness of breath or chest pain I do not think this is completely unreasonable.  I discussed with the patient the importance of following up with her primary care doctor tomorrow.  Additionally the patient did have pain of her left leg here.  She has been seen in the emergency department for this in the past and is currently being worked up for it  with her primary care.    ____________________________________________   FINAL CLINICAL IMPRESSION(S) / ED DIAGNOSES  Final diagnoses:  Shortness of breath  Left leg pain     Note: This dictation was prepared with Dragon dictation. Any transcriptional errors that result from this process are unintentional     Nance Pear, MD 05/28/19 2253

## 2019-05-28 NOTE — Discharge Instructions (Addendum)
As we discussed it is very important that you follow up closely with your doctor. Please return for any further shortness of breath, any chest pain, fevers, significant cough or any other new or concerning symptoms.

## 2019-06-06 ENCOUNTER — Ambulatory Visit
Admission: RE | Admit: 2019-06-06 | Discharge: 2019-06-06 | Disposition: A | Discharge status: Home / Self Care | Payer: Medicare HMO | Source: Ambulatory Visit | Attending: Internal Medicine | Admitting: Internal Medicine

## 2019-06-06 ENCOUNTER — Other Ambulatory Visit: Payer: Self-pay

## 2019-06-06 DIAGNOSIS — S32030A Wedge compression fracture of third lumbar vertebra, initial encounter for closed fracture: Secondary | ICD-10-CM | POA: Diagnosis present

## 2019-11-19 ENCOUNTER — Encounter: Payer: Self-pay | Admitting: *Deleted

## 2019-11-19 ENCOUNTER — Emergency Department: Payer: Medicare HMO

## 2019-11-19 ENCOUNTER — Other Ambulatory Visit: Payer: Self-pay

## 2019-11-19 DIAGNOSIS — I700 Atherosclerosis of aorta: Secondary | ICD-10-CM | POA: Diagnosis not present

## 2019-11-19 DIAGNOSIS — Z91040 Latex allergy status: Secondary | ICD-10-CM | POA: Insufficient documentation

## 2019-11-19 DIAGNOSIS — E7800 Pure hypercholesterolemia, unspecified: Secondary | ICD-10-CM | POA: Diagnosis not present

## 2019-11-19 DIAGNOSIS — K219 Gastro-esophageal reflux disease without esophagitis: Secondary | ICD-10-CM | POA: Insufficient documentation

## 2019-11-19 DIAGNOSIS — Y9389 Activity, other specified: Secondary | ICD-10-CM | POA: Diagnosis not present

## 2019-11-19 DIAGNOSIS — Z7982 Long term (current) use of aspirin: Secondary | ICD-10-CM | POA: Diagnosis not present

## 2019-11-19 DIAGNOSIS — I10 Essential (primary) hypertension: Secondary | ICD-10-CM | POA: Insufficient documentation

## 2019-11-19 DIAGNOSIS — Z79899 Other long term (current) drug therapy: Secondary | ICD-10-CM | POA: Insufficient documentation

## 2019-11-19 DIAGNOSIS — K769 Liver disease, unspecified: Secondary | ICD-10-CM | POA: Diagnosis not present

## 2019-11-19 DIAGNOSIS — Z7951 Long term (current) use of inhaled steroids: Secondary | ICD-10-CM | POA: Diagnosis not present

## 2019-11-19 DIAGNOSIS — N189 Chronic kidney disease, unspecified: Secondary | ICD-10-CM | POA: Insufficient documentation

## 2019-11-19 DIAGNOSIS — Z9049 Acquired absence of other specified parts of digestive tract: Secondary | ICD-10-CM | POA: Diagnosis not present

## 2019-11-19 DIAGNOSIS — F3341 Major depressive disorder, recurrent, in partial remission: Secondary | ICD-10-CM | POA: Diagnosis not present

## 2019-11-19 DIAGNOSIS — T18128A Food in esophagus causing other injury, initial encounter: Secondary | ICD-10-CM | POA: Diagnosis not present

## 2019-11-19 DIAGNOSIS — J449 Chronic obstructive pulmonary disease, unspecified: Secondary | ICD-10-CM | POA: Insufficient documentation

## 2019-11-19 DIAGNOSIS — Z20822 Contact with and (suspected) exposure to covid-19: Secondary | ICD-10-CM | POA: Diagnosis not present

## 2019-11-19 DIAGNOSIS — F0390 Unspecified dementia without behavioral disturbance: Secondary | ICD-10-CM | POA: Insufficient documentation

## 2019-11-19 DIAGNOSIS — Z87891 Personal history of nicotine dependence: Secondary | ICD-10-CM | POA: Insufficient documentation

## 2019-11-19 DIAGNOSIS — I129 Hypertensive chronic kidney disease with stage 1 through stage 4 chronic kidney disease, or unspecified chronic kidney disease: Secondary | ICD-10-CM | POA: Insufficient documentation

## 2019-11-19 DIAGNOSIS — Z885 Allergy status to narcotic agent status: Secondary | ICD-10-CM | POA: Diagnosis not present

## 2019-11-19 DIAGNOSIS — X58XXXA Exposure to other specified factors, initial encounter: Secondary | ICD-10-CM | POA: Diagnosis not present

## 2019-11-19 DIAGNOSIS — T18108A Unspecified foreign body in esophagus causing other injury, initial encounter: Secondary | ICD-10-CM | POA: Diagnosis present

## 2019-11-19 NOTE — ED Triage Notes (Signed)
First Nurse NOte:  Arrives via [facility] for ED evaluation of possible chicken bone in throat.  Voice clear and strong.  Sats 98%

## 2019-11-19 NOTE — ED Triage Notes (Signed)
Pt brought in via ems from home with a chicken bone hung in her throat.  Pt reports pain when swallowing.  No acute resp distress   Pt alert.

## 2019-11-20 ENCOUNTER — Emergency Department: Payer: Medicare HMO

## 2019-11-20 ENCOUNTER — Ambulatory Visit
Admission: EM | Admit: 2019-11-20 | Discharge: 2019-11-20 | Disposition: A | Discharge status: Home / Self Care | Payer: Medicare HMO | Attending: Emergency Medicine | Admitting: Emergency Medicine

## 2019-11-20 ENCOUNTER — Emergency Department: Payer: Medicare HMO | Admitting: Anesthesiology

## 2019-11-20 ENCOUNTER — Encounter: Payer: Self-pay | Admitting: Gastroenterology

## 2019-11-20 ENCOUNTER — Encounter
Admission: EM | Disposition: A | Discharge status: Home / Self Care | Payer: Self-pay | Source: Home / Self Care | Attending: Emergency Medicine

## 2019-11-20 DIAGNOSIS — T18108A Unspecified foreign body in esophagus causing other injury, initial encounter: Secondary | ICD-10-CM

## 2019-11-20 DIAGNOSIS — T18198D Other foreign object in esophagus causing other injury, subsequent encounter: Secondary | ICD-10-CM

## 2019-11-20 HISTORY — DX: Unspecified dementia, unspecified severity, without behavioral disturbance, psychotic disturbance, mood disturbance, and anxiety: F03.90

## 2019-11-20 HISTORY — PX: ESOPHAGOGASTRODUODENOSCOPY: SHX5428

## 2019-11-20 LAB — SARS CORONAVIRUS 2 BY RT PCR (HOSPITAL ORDER, PERFORMED IN ~~LOC~~ HOSPITAL LAB): SARS Coronavirus 2: NEGATIVE

## 2019-11-20 SURGERY — EGD (ESOPHAGOGASTRODUODENOSCOPY)
Anesthesia: General

## 2019-11-20 MED ORDER — GLYCOPYRROLATE 0.2 MG/ML IJ SOLN
INTRAMUSCULAR | Status: DC | PRN
  Administered 2019-11-20: .2 mg via INTRAVENOUS

## 2019-11-20 MED ORDER — DEXAMETHASONE SODIUM PHOSPHATE 10 MG/ML IJ SOLN
INTRAMUSCULAR | Status: DC | PRN
  Administered 2019-11-20: 10 mg via INTRAVENOUS

## 2019-11-20 MED ORDER — FENTANYL CITRATE (PF) 100 MCG/2ML IJ SOLN
INTRAMUSCULAR | Status: AC
  Filled 2019-11-20: qty 2

## 2019-11-20 MED ORDER — LIDOCAINE HCL (PF) 2 % IJ SOLN
INTRAMUSCULAR | Status: AC
  Filled 2019-11-20: qty 5

## 2019-11-20 MED ORDER — PROPOFOL 500 MG/50ML IV EMUL
INTRAVENOUS | Status: AC
  Filled 2019-11-20: qty 50

## 2019-11-20 MED ORDER — DEXAMETHASONE SODIUM PHOSPHATE 10 MG/ML IJ SOLN
INTRAMUSCULAR | Status: AC
  Filled 2019-11-20: qty 1

## 2019-11-20 MED ORDER — ONDANSETRON HCL 4 MG/2ML IJ SOLN
INTRAMUSCULAR | Status: AC
  Filled 2019-11-20: qty 2

## 2019-11-20 MED ORDER — ONDANSETRON HCL 4 MG/2ML IJ SOLN
INTRAMUSCULAR | Status: DC | PRN
  Administered 2019-11-20: 4 mg via INTRAVENOUS

## 2019-11-20 MED ORDER — SODIUM CHLORIDE 0.9 % IV SOLN: INTRAVENOUS | Status: DC

## 2019-11-20 MED ORDER — SUCCINYLCHOLINE CHLORIDE 20 MG/ML IJ SOLN
INTRAMUSCULAR | Status: DC | PRN
  Administered 2019-11-20: 100 mg via INTRAVENOUS

## 2019-11-20 MED ORDER — FENTANYL CITRATE (PF) 100 MCG/2ML IJ SOLN
INTRAMUSCULAR | Status: DC | PRN
Start: 2019-11-20 — End: 2019-11-20
  Administered 2019-11-20 (×2): 50 ug via INTRAVENOUS

## 2019-11-20 MED ORDER — FENTANYL CITRATE (PF) 100 MCG/2ML IJ SOLN: 25.0000 ug | INTRAMUSCULAR | Status: DC | PRN

## 2019-11-20 MED ORDER — SUCCINYLCHOLINE CHLORIDE 200 MG/10ML IV SOSY
PREFILLED_SYRINGE | INTRAVENOUS | Status: AC
  Filled 2019-11-20: qty 10

## 2019-11-20 MED ORDER — PHENYLEPHRINE HCL (PRESSORS) 10 MG/ML IV SOLN
INTRAVENOUS | Status: AC
  Filled 2019-11-20: qty 1

## 2019-11-20 MED ORDER — ONDANSETRON HCL 4 MG/2ML IJ SOLN: 4.0000 mg | Freq: Once | INTRAMUSCULAR | Status: DC | PRN

## 2019-11-20 MED ORDER — ROCURONIUM BROMIDE 100 MG/10ML IV SOLN
INTRAVENOUS | Status: DC | PRN
  Administered 2019-11-20: 10 mg via INTRAVENOUS

## 2019-11-20 MED ORDER — LIDOCAINE HCL (CARDIAC) PF 100 MG/5ML IV SOSY
PREFILLED_SYRINGE | INTRAVENOUS | Status: DC | PRN
  Administered 2019-11-20: 100 mg via INTRAVENOUS

## 2019-11-20 MED ORDER — PROPOFOL 10 MG/ML IV BOLUS
INTRAVENOUS | Status: DC | PRN
  Administered 2019-11-20: 150 mg via INTRAVENOUS

## 2019-11-20 MED ORDER — PROPOFOL 10 MG/ML IV BOLUS
INTRAVENOUS | Status: AC
  Filled 2019-11-20: qty 20

## 2019-11-20 NOTE — ED Notes (Signed)
Pharmacy tech at bedside 

## 2019-11-20 NOTE — H&P (Signed)
Outpatient short stay form Pre-procedure 11/20/2019 7:41 AM Merlyn Lot MD, MPH  Primary Physician: None listed  Reason for visit:  Food Bolus  History of present illness:   79 y/o lady who was eating a chicken bone yesterday and felt that she accidenatlly swallowed it. Has pain and points to her hyoid bone area but is tolerating her secretions. No blood thinners. No neck surgeries.    Current Facility-Administered Medications:  .  0.9 %  sodium chloride infusion, , Intravenous, Continuous, Akeen Ledyard, Rossie Muskrat, MD, Last Rate: 20 mL/hr at 11/20/19 0721, New Bag at 11/20/19 0721  Medications Prior to Admission  Medication Sig Dispense Refill Last Dose  . amLODipine (NORVASC) 10 MG tablet Take 10 mg by mouth daily.   11/19/2019 at Unknown time  . aspirin EC 81 MG tablet Take by mouth.   11/19/2019 at Unknown time  . buPROPion (WELLBUTRIN XL) 150 MG 24 hr tablet Take 150 mg by mouth daily.   11/18/2019  . clonazePAM (KLONOPIN) 1 MG tablet Take by mouth 2 (two) times daily.    11/19/2019 at Unknown time  . DULoxetine (CYMBALTA) 60 MG capsule Take by mouth daily.    11/19/2019 at Unknown time  . fluticasone-salmeterol (ADVAIR HFA) 115-21 MCG/ACT inhaler Inhale 2 puffs into the lungs every 12 (twelve) hours.   11/19/2019 at Unknown time  . gabapentin (NEURONTIN) 100 MG capsule Take by mouth 2 (two) times daily.    11/19/2019 at Unknown time  . HYDROcodone-acetaminophen (NORCO/VICODIN) 5-325 MG tablet Take 1 tablet by mouth every 6 (six) hours as needed.   prn at prn  . irbesartan (AVAPRO) 300 MG tablet Take 300 mg by mouth daily.    11/19/2019 at Unknown time  . memantine (NAMENDA) 10 MG tablet Take 10 mg by mouth 2 (two) times daily.    11/19/2019 at Unknown time  . OXcarbazepine (TRILEPTAL) 150 MG tablet 1 po qam qnd 2 po qhs   11/19/2019 at Unknown time  . acyclovir (ZOVIRAX) 800 MG tablet Take by mouth. (Patient not taking: Reported on 11/20/2019)   Not Taking at Unknown time  . lidocaine (LIDODERM) 5 % Place  1 patch onto the skin daily. Remove & Discard patch within 12 hours or as directed by MD (Patient not taking: Reported on 11/20/2019) 30 patch 0 Not Taking at Unknown time  . traMADol (ULTRAM) 50 MG tablet Take 1 tablet (50 mg total) by mouth every 6 (six) hours as needed. (Patient not taking: Reported on 11/20/2019) 20 tablet 0 Not Taking at Unknown time     Allergies  Allergen Reactions  . Morphine And Related   . Latex Rash    "I break out"     Past Medical History:  Diagnosis Date  . Chronic kidney disease   . COPD (chronic obstructive pulmonary disease) ([facility])   . Dementia ([facility])   . Depression   . Hypertension   . Liver disease     Review of systems:  Otherwise negative.    Physical Exam  Gen: Alert, oriented. Appears stated age.  HEENT: [place]/AT. PERRLA. Lungs: no respiratory distress Abd: soft, benign, no masses.  Ext: No edema.     Planned procedures: Proceed with EGD. The patient understands the nature of the planned procedure, indications, risks, alternatives and potential complications including but not limited to bleeding, infection, perforation, damage to internal organs and possible oversedation/side effects from anesthesia. The patient agrees and gives consent to proceed.  Please refer to procedure notes for findings, recommendations  and patient disposition/instructions.     Merlyn Lot MD, MPH Gastroenterology 11/20/2019  7:41 AM

## 2019-11-20 NOTE — Anesthesia Preprocedure Evaluation (Signed)
Anesthesia Evaluation  Patient identified by MRN, date of birth, ID band Patient awake    Reviewed: Allergy & Precautions, NPO status , Patient's Chart, lab work & pertinent test results  History of Anesthesia Complications Negative for: history of anesthetic complications  Airway Mallampati: II       Dental   Pulmonary neg sleep apnea, COPD,  COPD inhaler, Not current smoker, former smoker,           Cardiovascular hypertension, Pt. on medications (-) Past MI and (-) CHF (-) dysrhythmias (-) Valvular Problems/Murmurs     Neuro/Psych neg Seizures Depression Dementia    GI/Hepatic Neg liver ROS, GERD  ,  Endo/Other  neg diabetes  Renal/GU Renal InsufficiencyRenal disease     Musculoskeletal   Abdominal   Peds  Hematology   Anesthesia Other Findings   Reproductive/Obstetrics                             Anesthesia Physical Anesthesia Plan  ASA: III and emergent  Anesthesia Plan: General   Post-op Pain Management:    Induction: Intravenous  PONV Risk Score and Plan: 3 and Ondansetron, Propofol infusion and TIVA  Airway Management Planned: Oral ETT  Additional Equipment:   Intra-op Plan:   Post-operative Plan:   Informed Consent: I have reviewed the patients History and Physical, chart, labs and discussed the procedure including the risks, benefits and alternatives for the proposed anesthesia with the patient or authorized representative who has indicated his/her understanding and acceptance.       Plan Discussed with:   Anesthesia Plan Comments:         Anesthesia Quick Evaluation

## 2019-11-20 NOTE — OR Nursing (Signed)
Pt sent to radiology via stretcher.

## 2019-11-20 NOTE — Interval H&P Note (Signed)
History and Physical Interval Note:  11/20/2019 7:46 AM  Samantha Carpenter  has presented today for surgery, with the diagnosis of food impaction.  The various methods of treatment have been discussed with the patient and family. After consideration of risks, benefits and other options for treatment, the patient has consented to  Procedure(s): ESOPHAGOGASTRODUODENOSCOPY (EGD) (N/A) as a surgical intervention.  The patient's history has been reviewed, patient examined, no change in status, stable for surgery.  I have reviewed the patient's chart and labs.  Questions were answered to the patient's satisfaction.     Regis Bill  Ok to proceed with EGD.

## 2019-11-20 NOTE — Op Note (Signed)
[facility] Patient Name: Samantha Carpenter Procedure Date: 11/20/2019 7:28 AM MRN: [id number] Account #: [id number] Date of Birth: Apr 10, 1940 Admit Type: Outpatient Age: 79 Room: [facility] ENDO ROOM 2 Gender: Female Note Status: Finalized Procedure:             Upper GI endoscopy Indications:           Foreign body in the esophagus Providers:             Andrey Farmer MD, MD Medicines:             General Anesthesia Complications:         No immediate complications. Estimated blood loss:                         Minimal. Procedure:             Pre-Anesthesia Assessment:                        - Prior to the procedure, a History and Physical was                         performed, and patient medications and allergies were                         reviewed. The patient is competent. The risks and                         benefits of the procedure and the sedation options and                         risks were discussed with the patient. All questions                         were answered and informed consent was obtained.                         Patient identification and proposed procedure were                         verified by the physician, the nurse, the                         anesthesiologist, the anesthetist and the technician                         in the endoscopy suite. Mental Status Examination:                         alert and oriented. Airway Examination: normal                         oropharyngeal airway and neck mobility. Respiratory                         Examination: clear to auscultation. CV Examination:                         normal. Prophylactic Antibiotics: The patient does not  require prophylactic antibiotics. Prior                         Anticoagulants: The patient has taken no previous                         anticoagulant or antiplatelet agents. ASA Grade                         Assessment: E -  Emergency. After reviewing the risks                         and benefits, the patient was deemed in satisfactory                         condition to undergo the procedure. The anesthesia                         plan was to use monitored anesthesia care (MAC).                         Immediately prior to administration of medications,                         the patient was re-assessed for adequacy to receive                         sedatives. The heart rate, respiratory rate, oxygen                         saturations, blood pressure, adequacy of pulmonary                         ventilation, and response to care were monitored                         throughout the procedure. The physical status of the                         patient was re-assessed after the procedure.                        After obtaining informed consent, the endoscope was                         passed under direct vision. Throughout the procedure,                         the patient's blood pressure, pulse, and oxygen                         saturations were monitored continuously. The Endoscope                         was introduced through the mouth, and advanced to the                         second part of duodenum. The upper GI endoscopy was  accomplished without difficulty. The patient tolerated                         the procedure well. Findings:      Food was found at the cricopharyngeus. Removal was accomplished with a       coin-grabber. Estimated blood loss was minimal.      The examined esophagus was normal.      Localized mildly erythematous mucosa without bleeding was found in the       gastric antrum.      The examined duodenum was normal. Impression:            - Food was found in the esophagus. Removal was                         successful.                        - Normal esophagus.                        - Erythematous mucosa in the antrum.                        -  Normal examined duodenum. Recommendation:        - Return patient to hospital ward for possible                         discharge same day.                        - Perform a PA chest x-ray today.                        - Advance diet as tolerated.                        - Continue present medications.                        - Repeat upper endoscopy in 8 weeks.                        - Return to referring physician as previously                         scheduled. Procedure Code(s):     --- Professional ---                        [phone], Esophagogastroduodenoscopy, flexible,                         transoral; with removal of foreign body(s) Diagnosis Code(s):     --- Professional ---                        I96.789F, Food in esophagus causing other injury,                         initial encounter  K31.89, Other diseases of stomach and duodenum                        T18.108A, Unspecified foreign body in esophagus                         causing other injury, initial encounter CPT copyright 2019 American [employer] Association. All rights reserved. The codes documented in this report are preliminary and upon coder review may  be revised to meet current compliance requirements. Andrey Farmer, MD Andrey Farmer MD, MD 11/20/2019 8:27:05 AM Number of Addenda: 0 Note Initiated On: 11/20/2019 7:28 AM Estimated Blood Loss:  Estimated blood loss was minimal.      [facility]

## 2019-11-20 NOTE — Anesthesia Postprocedure Evaluation (Signed)
Anesthesia Post Note  Patient: Samantha Carpenter  Procedure(s) Performed: ESOPHAGOGASTRODUODENOSCOPY (EGD) (N/A )  Patient location during evaluation: Endoscopy Anesthesia Type: General Level of consciousness: awake and alert Pain management: pain level controlled Vital Signs Assessment: post-procedure vital signs reviewed and stable Respiratory status: spontaneous breathing and respiratory function stable Cardiovascular status: stable Anesthetic complications: no   No complications documented.   Last Vitals:  Vitals:   11/20/19 0858 11/20/19 0908  BP: (!) 111/51 129/76  Pulse: 60 65  Resp: 16 16  Temp: (!) 36.2 C   SpO2: 94% 92%    Last Pain:  Vitals:   11/20/19 0908  TempSrc:   PainSc: 2                  Niang Mitcheltree K

## 2019-11-20 NOTE — ED Notes (Signed)
Patient c/o bone from chicken wing stuck in throat. Patient speaking clearly, able to maintain secretions. Patient reports 2/10 pain baseline and 8/10 pain when swallowing.  Patient reports last PO intake at 1800 on 9/2.

## 2019-11-20 NOTE — Anesthesia Procedure Notes (Addendum)
Procedure Name: Intubation Date/Time: 11/20/2019 7:56 AM Performed by: Katherine Basset, CRNA Pre-anesthesia Checklist: Patient identified, Emergency Drugs available, Suction available and Patient being monitored Patient Re-evaluated:Patient Re-evaluated prior to induction Oxygen Delivery Method: Circle system utilized Preoxygenation: Pre-oxygenation with 100% oxygen Induction Type: IV induction, Rapid sequence and Cricoid Pressure applied Ventilation: Mask ventilation without difficulty Laryngoscope Size: Miller and 2 Grade View: Grade I Tube type: Oral Tube size: 7.0 mm Number of attempts: 1 Airway Equipment and Method: Stylet and Oral airway Placement Confirmation: ETT inserted through vocal cords under direct vision,  positive ETCO2 and breath sounds checked- equal and bilateral Secured at: 20 cm Tube secured with: Tape Dental Injury: Teeth and Oropharynx as per pre-operative assessment

## 2019-11-20 NOTE — OR Nursing (Signed)
Pt arrives back from radiology. Pt's only ride home is sister and she can come pick up pt after work at 1430.

## 2019-11-20 NOTE — ED Provider Notes (Signed)
[facility] Emergency Department Provider Note  ____________________________________________   First MD Initiated Contact with Patient 11/20/19 [phone]     (approximate)  I have reviewed the triage vital signs and the nursing notes.   HISTORY  Chief Complaint Foreign Body   HPI Samantha Carpenter is a 79 y.o. female with below list of previous medical conditions including chronic kidney disease COPD hypertension presents to the emergency department secondary to concern for possible chicken bone stuck in her throat.  Patient states that she was eating a chicken wing at 6 PM yesterday evening when she felt as though the bone became lodged in her throat.  Patient states that time she has had odynophagia with foreign body sensation.  She denies any difficulty breathing.  Last p.o. ingestion was at 6 PM yesterday.       Past Medical History:  Diagnosis Date  . Chronic kidney disease   . COPD (chronic obstructive pulmonary disease) (HCC)   . Depression   . Hypertension   . Liver disease     Patient Active Problem List   Diagnosis Date Noted  . Pure hypercholesterolemia 06/09/2018  . Recurrent major depressive disorder, 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  . 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 HTN, goal below 140/80 04/28/2018  . Dementia, senile, without behavioral disturbance (HCC) 04/28/2018  . CKD (chronic kidney disease) stage 3, GFR 30-59 ml/min 04/28/2018    Past Surgical History:  Procedure Laterality Date  . ABDOMINAL HYSTERECTOMY    . CHOLECYSTECTOMY    . face tuck      Prior to Admission medications   Medication Sig Start Date End Date Taking? Authorizing Provider  acyclovir (ZOVIRAX) 800 MG tablet Take by mouth. 06/18/18   [provider]  amLODipine (NORVASC) 5 MG tablet Take 1 tablet (5 mg total) by mouth daily for 30 days. 04/16/18 05/16/18  Schaevitz, Myra Rude, MD  aspirin EC 81 MG tablet Take by mouth.    [provider]  bisoprolol (ZEBETA) 5 MG tablet Take by mouth. 04/28/18 04/28/19  [provider]  clonazePAM Scarlette Calico) 1 MG tablet Take by mouth. 05/08/18   [provider]  DULoxetine (CYMBALTA) 60 MG capsule Take by mouth. 08/25/18   [provider]  gabapentin (NEURONTIN) 100 MG capsule Take by mouth. 06/18/18   [provider]  irbesartan (AVAPRO) 300 MG tablet Take by mouth. 04/28/18 04/28/19  [provider]  lidocaine (LIDODERM) 5 % Place 1 patch onto the skin daily. Remove & Discard patch within 12 hours or as directed by MD 05/11/19   Irean Hong, MD  memantine (NAMENDA) 10 MG tablet Take by mouth. 06/18/18   [provider]  OXcarbazepine (TRILEPTAL) 150 MG tablet 1 po qam qnd 2 po qhs 06/12/18   [provider]  traMADol (ULTRAM) 50 MG tablet Take 1 tablet (50 mg total) by mouth every 6 (six) hours as needed. 05/11/19   Irean Hong, MD    Allergies Morphine and related and Latex  Family History  Problem Relation Age of Onset  . Bipolar disorder Brother   . Schizophrenia Brother   . Bipolar disorder Sister   . Schizophrenia Sister   . Drug abuse Son     Social History Social History   Tobacco Use  . Smoking status: Former Smoker    Quit date: 09/01/2009    Years since quitting: 10.2  . Smokeless tobacco: Never Used  Vaping Use  . Vaping Use: Never  used  Substance Use Topics  . Alcohol use: Not Currently  . Drug use: Not Currently    Review of Systems Constitutional: No fever/chills Eyes: No visual changes. ENT: Positive for pain with swallowing and foreign body sensation throat Cardiovascular: Denies chest pain. Respiratory: Denies shortness of breath. Gastrointestinal: No abdominal pain.  No nausea, no vomiting.  No diarrhea.  No constipation. Genitourinary: Negative for dysuria. Musculoskeletal: Negative for neck pain.  Negative for back pain. Integumentary: Negative for rash. Neurological: Negative for  headaches, focal weakness or numbness.  ____________________________________________   PHYSICAL EXAM:  VITAL SIGNS: ED Triage Vitals  Enc Vitals Group     BP 11/19/19 2011 (!) 187/79     Pulse Rate 11/19/19 2011 (!) 57     Resp 11/19/19 2011 20     Temp 11/19/19 2011 97.8 F (36.6 C)     Temp Source 11/19/19 2011 Oral     SpO2 11/19/19 2011 98 %     Weight 11/19/19 2012 66.7 kg (147 lb)     Height 11/19/19 2012 1.575 m (5\' 2" )     Head Circumference --      Peak Flow --      Pain Score 11/19/19 2012 8     Pain Loc --      Pain Edu? --      Excl. in GC? --     Constitutional: Alert and oriented.  Eyes: Conjunctivae are normal.  Head: Atraumatic. Mouth/Throat: Patient is wearing a mask. Neck: No stridor.  No meningeal signs.   Cardiovascular: Normal rate, regular rhythm. Good peripheral circulation. Grossly normal heart sounds. Respiratory: Normal respiratory effort.  No retractions. Gastrointestinal: Soft and nontender. No distention.  Musculoskeletal: No lower extremity tenderness nor edema. No gross deformities of extremities. Neurologic:  Normal speech and language. No gross focal neurologic deficits are appreciated.  Skin:  Skin is warm, dry and intact. Psychiatric: Mood and affect are normal. Speech and behavior are normal.    Labs Reviewed - No data to display _______________________________________  RADIOLOGY I, [place] N Enaya Howze, personally viewed and evaluated these images (plain radiographs) as part of my medical decision making, as well as reviewing the written report by the radiologist.  ED MD interpretation: Possible chicken bone noted in the upper cervical esophagus on plain x-ray.  CT soft tissue neck revealed bony density foreign body noted transversely the proximal esophagus.  Official radiology report(s): DG Neck Soft Tissue  Result Date: 11/19/2019 CLINICAL DATA:  Pt brought in via ems from home with a chicken bone hung in her throat. Pt reports  pain when swallowing. No acute resp distress EXAM: NECK SOFT TISSUES - 1+ VIEW COMPARISON:  None. FINDINGS: There is a curved density projecting posterior to the inferior calcified thyroid cartilage, anterior to the lower endplate of C5, which could reflect a a chicken bone at the level of the upper cervical esophagus. There is no other evidence of a radiopaque foreign body. There is extensive calcification of the thyroid cartilages. Calcification is noted along the right neck that is consistent with carotid artery atherosclerotic calcifications. No soft tissue mass or prevertebral swelling. The airway is widely patent. Disc degenerative changes are noted from C4-C5 C7-T1. IMPRESSION: 1. Possible chicken bone that projects posterior to the calcified thyroid cartilage, in the expected location of the upper cervical esophagus, as detailed above. This could be further clarified with soft tissue neck CT without contrast. Electronically Signed   By: 01/19/2020 M.D.   On: 11/19/2019 20:55  Procedures   ____________________________________________   INITIAL IMPRESSION / MDM / ASSESSMENT AND PLAN / ED COURSE  As part of my medical decision making, I reviewed the following data within the electronic MEDICAL RECORD NUMBER   79 year old female presented with above-stated history and physical exam secondary to esophageal foreign body (chicken bone).  Patient discussed with Dr. Allegra Lai gastroenterologist who plans to take the patient for endoscopy removal of the foreign body this morning.  ____________________________________________  FINAL CLINICAL IMPRESSION(S) / ED DIAGNOSES  Final diagnoses:  Foreign body in esophagus, initial encounter     MEDICATIONS GIVEN DURING THIS VISIT:  Medications - No data to display   ED Discharge Orders    None      *Please note:  Samantha Carpenter was evaluated in Emergency Department on 11/20/2019 for the symptoms described in the history of present illness. She was  evaluated in the context of the global COVID-19 pandemic, which necessitated consideration that the patient might be at risk for infection with the SARS-CoV-2 virus that causes COVID-19. Institutional protocols and algorithms that pertain to the evaluation of patients at risk for COVID-19 are in a state of rapid change based on information released by regulatory bodies including the CDC and federal and state organizations. These policies and algorithms were followed during the patient's care in the ED.  Some ED evaluations and interventions may be delayed as a result of limited staffing during and after the pandemic.*  Note:  This document was prepared using Dragon voice recognition software and may include unintentional dictation errors.   Darci Current, MD 11/20/19 [phone]

## 2019-11-20 NOTE — Transfer of Care (Signed)
Immediate Anesthesia Transfer of Care Note  Patient: Samantha Carpenter  Procedure(s) Performed: ESOPHAGOGASTRODUODENOSCOPY (EGD) (N/A )  Patient Location: PACU  Anesthesia Type:General  Level of Consciousness: awake, alert  and oriented  Airway & Oxygen Therapy: Patient Spontanous Breathing and Patient connected to face mask oxygen  Post-op Assessment: Report given to RN and Post -op Vital signs reviewed and stable  Post vital signs: Reviewed and stable  Last Vitals:  Vitals Value Taken Time  BP 134/54 11/20/19 0822  Temp 36.8 C 11/20/19 0822  Pulse 60 11/20/19 0825  Resp 16 11/20/19 0825  SpO2 100 % 11/20/19 0825  Vitals shown include unvalidated device data.  Last Pain:  Vitals:   11/20/19 0702  TempSrc: Temporal  PainSc:          Complications: No complications documented.

## 2019-11-22 ENCOUNTER — Encounter: Payer: Self-pay | Admitting: Gastroenterology

## 2020-08-27 ENCOUNTER — Other Ambulatory Visit: Payer: Self-pay

## 2020-08-27 ENCOUNTER — Emergency Department
Admission: EM | Admit: 2020-08-27 | Discharge: 2020-08-27 | Disposition: A | Discharge status: Home / Self Care | Payer: Medicare HMO | Attending: Emergency Medicine | Admitting: Emergency Medicine

## 2020-08-27 DIAGNOSIS — Z87891 Personal history of nicotine dependence: Secondary | ICD-10-CM | POA: Diagnosis not present

## 2020-08-27 DIAGNOSIS — N183 Chronic kidney disease, stage 3 unspecified: Secondary | ICD-10-CM | POA: Insufficient documentation

## 2020-08-27 DIAGNOSIS — I129 Hypertensive chronic kidney disease with stage 1 through stage 4 chronic kidney disease, or unspecified chronic kidney disease: Secondary | ICD-10-CM | POA: Insufficient documentation

## 2020-08-27 DIAGNOSIS — F0390 Unspecified dementia without behavioral disturbance: Secondary | ICD-10-CM | POA: Insufficient documentation

## 2020-08-27 DIAGNOSIS — Z7982 Long term (current) use of aspirin: Secondary | ICD-10-CM | POA: Insufficient documentation

## 2020-08-27 DIAGNOSIS — I10 Essential (primary) hypertension: Secondary | ICD-10-CM

## 2020-08-27 DIAGNOSIS — Z79899 Other long term (current) drug therapy: Secondary | ICD-10-CM | POA: Diagnosis not present

## 2020-08-27 DIAGNOSIS — J449 Chronic obstructive pulmonary disease, unspecified: Secondary | ICD-10-CM | POA: Diagnosis not present

## 2020-08-27 DIAGNOSIS — Z91040 Latex allergy status: Secondary | ICD-10-CM | POA: Insufficient documentation

## 2020-08-27 LAB — CBC WITH DIFFERENTIAL/PLATELET
Abs Immature Granulocytes: 0.01 10*3/uL (ref 0.00–0.07)
Basophils Absolute: 0 10*3/uL (ref 0.0–0.1)
Basophils Relative: 1 %
Eosinophils Absolute: 0.7 10*3/uL — ABNORMAL HIGH (ref 0.0–0.5)
Eosinophils Relative: 14 %
HCT: 42 % (ref 36.0–46.0)
Hemoglobin: 14.5 g/dL (ref 12.0–15.0)
Immature Granulocytes: 0 %
Lymphocytes Relative: 38 %
Lymphs Abs: 2 10*3/uL (ref 0.7–4.0)
MCH: 32 pg (ref 26.0–34.0)
MCHC: 34.5 g/dL (ref 30.0–36.0)
MCV: 92.7 fL (ref 80.0–100.0)
Monocytes Absolute: 0.5 10*3/uL (ref 0.1–1.0)
Monocytes Relative: 9 %
Neutro Abs: 1.9 10*3/uL (ref 1.7–7.7)
Neutrophils Relative %: 38 %
Platelets: 260 10*3/uL (ref 150–400)
RBC: 4.53 MIL/uL (ref 3.87–5.11)
RDW: 12.4 % (ref 11.5–15.5)
WBC: 5.1 10*3/uL (ref 4.0–10.5)
nRBC: 0 % (ref 0.0–0.2)

## 2020-08-27 LAB — COMPREHENSIVE METABOLIC PANEL
ALT: 16 U/L (ref 0–44)
AST: 25 U/L (ref 15–41)
Albumin: 4.6 g/dL (ref 3.5–5.0)
Alkaline Phosphatase: 65 U/L (ref 38–126)
Anion gap: 5 (ref 5–15)
BUN: 13 mg/dL (ref 8–23)
CO2: 34 mmol/L — ABNORMAL HIGH (ref 22–32)
Calcium: 9.5 mg/dL (ref 8.9–10.3)
Chloride: 94 mmol/L — ABNORMAL LOW (ref 98–111)
Creatinine, Ser: 0.82 mg/dL (ref 0.44–1.00)
GFR, Estimated: >60 mL/min (ref >60)
Glucose, Bld: 90 mg/dL (ref 70–99)
Potassium: 3.5 mmol/L (ref 3.5–5.1)
Sodium: 133 mmol/L — ABNORMAL LOW (ref 135–145)
Total Bilirubin: 0.5 mg/dL (ref 0.3–1.2)
Total Protein: 7.1 g/dL (ref 6.5–8.1)

## 2020-08-27 MED ORDER — AMLODIPINE BESYLATE 10 MG PO TABS: 10.0000 mg | ORAL_TABLET | Freq: Every day | ORAL | 0 refills | Status: DC

## 2020-08-27 MED ORDER — AMLODIPINE BESYLATE 5 MG PO TABS
10.0000 mg | ORAL_TABLET | Freq: Once | ORAL | Status: AC
  Administered 2020-08-27: 10 mg via ORAL
  Filled 2020-08-27: qty 2

## 2020-08-27 NOTE — Discharge Instructions (Addendum)
Continue taking irbesartan 300mg  every morning.  Take amlodipine 10mg  every evening.  Stop taking bisoprolol and hydralazine.   Please follow up with your primary care doctor later this week to recheck your blood pressure.

## 2020-08-27 NOTE — ED Provider Notes (Signed)
Samantha Carpenter  ____________________________________________  Time seen: Approximately 11:10 PM  I have reviewed the triage vital signs and the nursing notes.   HISTORY  Chief Complaint Hypertension    HPI Samantha Carpenter is a 80 y.o. female with a history of COPD CKD dementia hypertension who comes ED complaining of uncontrolled high blood pressure.  Running about 240/120 at home.  Denies headache chest pain shortness of breath abdominal pain back pain or other acute symptoms.  For blood pressure she is currently taking irbesartan 300 mg every morning.  She is not taking amlodipine 10 mg. About 2 weeks ago her doctor told her to start taking hydralazine 3 times a day, but she reports this caused her blood pressure to get too low so she has not been taking it. She still has bisoprolol because the pharmacy called her and told her that they had refilled it, but generally has not been taking it over the last few weeks.  She did take 1 today though.    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 Authorizing Provider  acyclovir (ZOVIRAX) 800 MG tablet Take by mouth. Patient not taking: Reported on 11/20/2019 06/18/18   [provider]  amLODipine (NORVASC) 10 MG tablet Take 1 tablet (10 mg total) by mouth daily. 08/27/20 09/26/20  Sharman Cheek, MD  aspirin EC 81 MG tablet Take by mouth.    [provider]  buPROPion (WELLBUTRIN XL) 150 MG 24 hr tablet Take 150 mg by mouth daily. 10/27/19   [provider]  clonazePAM (KLONOPIN) 1 MG tablet Take by mouth 2 (two) times daily.  05/08/18   [provider]  DULoxetine (CYMBALTA) 60 MG capsule Take by mouth daily.  08/25/18   [provider]  fluticasone-salmeterol (ADVAIR HFA) 115-21 MCG/ACT inhaler Inhale 2 puffs into the lungs every 12 (twelve) hours. 01/07/19 01/07/20  [provider]  gabapentin (NEURONTIN) 100 MG capsule Take by mouth 2 (two) times daily.  06/18/18   [provider]  HYDROcodone-acetaminophen (NORCO/VICODIN) 5-325 MG tablet Take 1 tablet by mouth every 6 (six) hours as needed. 06/25/19   [provider]  irbesartan (AVAPRO) 300 MG tablet Take 300 mg by mouth daily.  04/28/18 11/20/19  [provider]  lidocaine (LIDODERM) 5 % Place 1 patch onto the skin daily. Remove & Discard patch within 12 hours or as directed by MD Patient not taking: Reported on 11/20/2019 05/11/19   Irean Hong, MD  memantine (NAMENDA) 10 MG tablet  Take 10 mg by mouth 2 (two) times daily.  06/18/18   [provider]  OXcarbazepine (TRILEPTAL) 150 MG tablet 1 po qam qnd 2 po qhs 06/12/18   [provider]  traMADol (ULTRAM) 50 MG tablet Take 1 tablet (50 mg total) by mouth every 6 (six) hours as needed. Patient not taking: Reported on 11/20/2019 05/11/19   Irean Hong, MD     Allergies Morphine and related and Latex   Family History  Problem Relation Age of Onset   Bipolar  disorder Brother    Schizophrenia Brother    Bipolar disorder Sister    Schizophrenia Sister    Drug abuse Son     Social History Social History   Tobacco Use   Smoking status: Former    Pack years: 0.00    Types: Cigarettes    Quit date: 09/01/2009    Years since quitting: 10.9   Smokeless tobacco: Never  Vaping Use   Vaping Use: Never used  Substance Use Topics   Alcohol use: Not Currently   Drug use: Not Currently    Review of Systems  Constitutional:   No fever or chills.  ENT:   No sore throat. No rhinorrhea. Cardiovascular:   No chest pain or syncope. Respiratory:   No dyspnea or cough. Gastrointestinal:   Negative for abdominal pain, vomiting and diarrhea.  Musculoskeletal:   Negative for focal pain or swelling All other systems reviewed and are negative except as documented above in ROS and HPI.  ____________________________________________   PHYSICAL EXAM:  VITAL SIGNS: ED Triage Vitals  Enc Vitals Group     BP 08/27/20 2202 (!) 221/81     Pulse Rate 08/27/20 2202 (!) 55     Resp 08/27/20 2202 18     Temp 08/27/20 2202 98.4 F (36.9 C)     Temp src --      SpO2 08/27/20 2202 97 %     Weight 08/27/20 2203 140 lb (63.5 kg)     Height 08/27/20 2203 5\' 7"  (1.702 m)     Head Circumference --      Peak Flow --      Pain Score --      Pain Loc --      Pain Edu? --      Excl. in GC? --     Vital signs reviewed, nursing assessments reviewed.   Constitutional:   Alert and oriented. Non-toxic appearance. Eyes:   Conjunctivae are normal. EOMI. PERRL. ENT      Head:   Normocephalic and atraumatic.      Nose:   Wearing a mask.      Mouth/Throat:   Wearing a mask.      Neck:   No meningismus. Full ROM. Hematological/Lymphatic/Immunilogical:   No cervical lymphadenopathy. Cardiovascular:   RRR. Symmetric bilateral radial and DP pulses.  No murmurs. Cap refill less than 2 seconds. Respiratory:   Normal respiratory effort without tachypnea/retractions.  Breath sounds are clear and equal bilaterally. No wheezes/rales/rhonchi. Gastrointestinal:   Soft and nontender. Non distended. There is no CVA tenderness.  No rebound, rigidity, or guarding. Genitourinary:   deferred Musculoskeletal:   Normal range of motion in all extremities. No joint effusions.  No lower extremity tenderness.  No edema. Neurologic:   Normal speech and language.  Motor grossly intact. No acute focal neurologic deficits are appreciated.  Skin:    Skin is warm, dry and intact. No rash noted.  No petechiae, purpura, or bullae.  ____________________________________________    LABS (pertinent positives/negatives) (all labs ordered are listed, but only abnormal results are displayed) Labs Reviewed  COMPREHENSIVE METABOLIC PANEL - Abnormal; Notable for the following components:      Result Value   Sodium 133 (*)    Chloride 94 (*)    CO2 34 (*)    All other components within normal limits  CBC WITH DIFFERENTIAL/PLATELET - Abnormal; Notable for the following components:   Eosinophils Absolute 0.7 (*)    All other components within normal limits   ____________________________________________   EKG  Interpreted by me Sinus bradycardia rate at home normal axis and intervals.  Poor R wave admission.  Normal ST segments and T waves.  ____________________________________________    RADIOLOGY  No results found.  ____________________________________________   PROCEDURES Procedures  ____________________________________________    CLINICAL IMPRESSION / ASSESSMENT AND PLAN / ED COURSE  Medications ordered in the ED: Medications  amLODipine (NORVASC) tablet 10 mg (10 mg Oral Given 08/27/20 2249)    Pertinent labs & imaging results that were available during my care of the patient were reviewed by me and considered in my medical decision making (see chart for details).  Samantha Carpenter was evaluated in Emergency Department on 08/27/2020 for the symptoms  described in the history of present illness. She was evaluated in the context of the global COVID-19 pandemic, which necessitated consideration that the patient might be at risk for infection with the SARS-CoV-2 virus that causes COVID-19. Institutional protocols and algorithms that pertain to the evaluation of patients at risk for COVID-19 are in a state of rapid change based on information released by regulatory bodies including the CDC and federal and state organizations. These policies and algorithms were followed during the patient's care in the ED.   Patient presents with asymptomatic hypertension.  In the ED her blood pressure is about 220/100.  After clarifying which blood pressure medicine she is and is not taking and which ones her PCP intends for her to take, I am recommending to her that she continue her irbesartan every morning, resume taking amlodipine each evening, and follow-up with your primary care doctor later this week for reassessment.  She will stop the hydralazine and the bisoprolol.  I have provided these instructions to her and a new medication list and written instructions.      ____________________________________________   FINAL CLINICAL IMPRESSION(S) / ED DIAGNOSES    Final diagnoses:  Uncontrolled hypertension     ED Discharge Orders          Ordered    amLODipine (NORVASC) 10 MG tablet  Daily        08/27/20 2310            Portions of this Carpenter were generated with dragon dictation software. Dictation errors may occur despite best attempts at proofreading.   Sharman Cheek, MD 08/27/20 2320

## 2020-08-27 NOTE — ED Triage Notes (Signed)
BIBA from home:  c/o high blood pressure on home monitor.  EMS pressure 240/120.  Denies headache, blurry vision, high blood pressure symptoms other than just having the elevated pressure.  EMS reports they found a DC summary from PCP this week and her pressure was 190s/110s then.    Patient advises that the "doctor is more confused than I am about those dang pills and I have dementia."    AOX4.  Denies pain at this time.  No acute distress.

## 2020-09-08 ENCOUNTER — Ambulatory Visit: Payer: Medicare HMO | Admitting: Audiology

## 2020-11-08 ENCOUNTER — Emergency Department: Payer: Medicare HMO

## 2020-11-08 ENCOUNTER — Other Ambulatory Visit: Payer: Self-pay

## 2020-11-08 DIAGNOSIS — Z7982 Long term (current) use of aspirin: Secondary | ICD-10-CM | POA: Diagnosis not present

## 2020-11-08 DIAGNOSIS — T07XXXA Unspecified multiple injuries, initial encounter: Secondary | ICD-10-CM

## 2020-11-08 DIAGNOSIS — R0602 Shortness of breath: Secondary | ICD-10-CM | POA: Insufficient documentation

## 2020-11-08 DIAGNOSIS — S40811A Abrasion of right upper arm, initial encounter: Secondary | ICD-10-CM | POA: Insufficient documentation

## 2020-11-08 DIAGNOSIS — Y9301 Activity, walking, marching and hiking: Secondary | ICD-10-CM | POA: Diagnosis not present

## 2020-11-08 DIAGNOSIS — Z79899 Other long term (current) drug therapy: Secondary | ICD-10-CM | POA: Insufficient documentation

## 2020-11-08 DIAGNOSIS — Z20822 Contact with and (suspected) exposure to covid-19: Secondary | ICD-10-CM | POA: Diagnosis not present

## 2020-11-08 DIAGNOSIS — Z91040 Latex allergy status: Secondary | ICD-10-CM | POA: Insufficient documentation

## 2020-11-08 DIAGNOSIS — F0390 Unspecified dementia without behavioral disturbance: Secondary | ICD-10-CM | POA: Diagnosis not present

## 2020-11-08 DIAGNOSIS — R778 Other specified abnormalities of plasma proteins: Secondary | ICD-10-CM | POA: Diagnosis not present

## 2020-11-08 DIAGNOSIS — S2231XA Fracture of one rib, right side, initial encounter for closed fracture: Principal | ICD-10-CM | POA: Insufficient documentation

## 2020-11-08 DIAGNOSIS — S0990XA Unspecified injury of head, initial encounter: Secondary | ICD-10-CM | POA: Diagnosis present

## 2020-11-08 DIAGNOSIS — S0081XA Abrasion of other part of head, initial encounter: Secondary | ICD-10-CM | POA: Diagnosis not present

## 2020-11-08 DIAGNOSIS — Y92009 Unspecified place in unspecified non-institutional (private) residence as the place of occurrence of the external cause: Secondary | ICD-10-CM | POA: Insufficient documentation

## 2020-11-08 DIAGNOSIS — W19XXXA Unspecified fall, initial encounter: Secondary | ICD-10-CM

## 2020-11-08 DIAGNOSIS — W101XXA Fall (on)(from) sidewalk curb, initial encounter: Secondary | ICD-10-CM | POA: Insufficient documentation

## 2020-11-08 DIAGNOSIS — I129 Hypertensive chronic kidney disease with stage 1 through stage 4 chronic kidney disease, or unspecified chronic kidney disease: Secondary | ICD-10-CM | POA: Diagnosis not present

## 2020-11-08 DIAGNOSIS — N183 Chronic kidney disease, stage 3 unspecified: Secondary | ICD-10-CM | POA: Diagnosis not present

## 2020-11-08 DIAGNOSIS — J449 Chronic obstructive pulmonary disease, unspecified: Secondary | ICD-10-CM | POA: Insufficient documentation

## 2020-11-08 DIAGNOSIS — Z87891 Personal history of nicotine dependence: Secondary | ICD-10-CM | POA: Insufficient documentation

## 2020-11-08 MED ORDER — CEPHALEXIN 500 MG PO CAPS: 1000.0000 mg | ORAL_CAPSULE | Freq: Two times a day (BID) | ORAL | 0 refills | Status: DC

## 2020-11-08 NOTE — ED Notes (Signed)
Discharge instructions reviewed with pt at this time. Pt verbalized understanding. Pt provided phone to try to call for ride.

## 2020-11-08 NOTE — ED Triage Notes (Addendum)
Pt states she had a mechanical fall when she was walking outside and tripped on the curb, pt has small abrasion to right cheek and right hand and c/o right shoulder pain. Pt states she is not on blood thinners.

## 2020-11-08 NOTE — ED Notes (Signed)
See triage note for CC - pt tripped over a curb and has a small abrasion to her right cheek and hand with associated right arm/shoulder pain.

## 2020-11-08 NOTE — ED Provider Notes (Signed)
[facility] Emergency Department Provider Note  ____________________________________________  Time seen: Approximately 10:33 PM  I have reviewed the triage vital signs and the nursing notes.   HISTORY  Chief Complaint Fall    HPI Samantha Carpenter is a 80 y.o. female who presents the emergency department multiple abrasions to the right upper extremity, abrasion to the right cheek after falling and hitting her head.  Patient does not use blood thinners but did hit her head after mechanical fall.  She states that she had parked her car, was going to check the mail when she tripped and fell hitting her face on the concrete.  She did try to brace her fall and landed on her right arm.  She initially she was having shoulder and wrist pain but patient states that this is improved and she declines any imaging.  Initially patient would not consent to CT scans of the head but after discussing the risk especially in the nature of the fall she finally agreed to CT scans of the head, face and neck.  No loss of consciousness initially or subsequently.  Patient denies any headache, visual changes.  No other complaints other than multiple abrasions to the hand and abrasion to the face currently.       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 Authorizing Provider  cephALEXin (KEFLEX) 500 MG capsule Take 2 capsules (1,000 mg total) by mouth 2 (two) times daily. 11/08/20  Yes Talyah Seder, Delorise Royals, PA-C  acyclovir (ZOVIRAX) 800 MG tablet Take by mouth. Patient not taking: Reported on 11/20/2019 06/18/18   [provider]  amLODipine (NORVASC) 10 MG tablet Take 1 tablet (10 mg total) by mouth daily. 08/27/20 09/26/20  Sharman Cheek, MD  aspirin EC 81 MG tablet Take by mouth.    [provider]  buPROPion (WELLBUTRIN XL) 150 MG 24 hr tablet Take 150 mg by mouth daily. 10/27/19   [provider]  clonazePAM (KLONOPIN) 1 MG tablet Take by mouth 2 (two) times daily.  05/08/18   [provider]  DULoxetine (CYMBALTA) 60 MG capsule Take by mouth daily.  08/25/18   [provider]  fluticasone-salmeterol (ADVAIR HFA) 115-21 MCG/ACT inhaler Inhale 2 puffs into the lungs every 12 (twelve) hours. 01/07/19 01/07/20  [provider]  gabapentin (NEURONTIN) 100 MG capsule Take by mouth 2 (two) times daily.  06/18/18   [provider]  HYDROcodone-acetaminophen (NORCO/VICODIN) 5-325 MG tablet Take 1 tablet by mouth every 6 (six) hours as needed. 06/25/19   [provider]  irbesartan (AVAPRO) 300 MG tablet Take 300 mg by mouth daily.  04/28/18 11/20/19  [provider]  lidocaine (LIDODERM) 5 % Place 1 patch onto the skin daily. Remove & Discard patch within 12 hours or as directed by MD Patient not taking: Reported on 11/20/2019 05/11/19   Irean Hong, MD  memantine (NAMENDA) 10 MG tablet Take 10 mg by mouth 2 (two) times daily.  06/18/18   [provider]  OXcarbazepine (TRILEPTAL) 150 MG  tablet 1 po qam qnd 2 po qhs 06/12/18   [provider]  traMADol (ULTRAM) 50 MG tablet Take 1 tablet (50 mg total) by mouth every 6 (six) hours as needed. Patient not taking: Reported on 11/20/2019 05/11/19   Irean Hong, MD    Allergies Morphine and related and Latex  Family History  Problem Relation Age of Onset   Bipolar disorder Brother    Schizophrenia Brother    Bipolar disorder Sister    Schizophrenia Sister    Drug abuse Son     Social History Social History   Tobacco Use   Smoking status: Former    Types: Cigarettes    Quit date: 09/01/2009    Years since quitting: 11.1   Smokeless tobacco: Never  Vaping Use   Vaping Use: Never used  Substance Use Topics   Alcohol use: Not Currently   Drug use: Not Currently     Review of Systems  Constitutional: No fever/chills.  Patient reports mechanical fall striking her head.  No loss of consciousness. Eyes: No visual changes. No discharge ENT: No upper respiratory complaints. Cardiovascular: no chest pain. Respiratory: no cough. No SOB. Gastrointestinal: No abdominal pain.  No nausea, no vomiting.  No diarrhea.  No constipation. Musculoskeletal: Initially endorsed right shoulder and right wrist pain, this is improved at this time.  Multiple abrasions to the right hand and forearm Skin: Negative for rash, abrasions, lacerations, ecchymosis. Neurological: Negative for headaches, focal weakness or numbness.  10 System ROS otherwise negative.  ____________________________________________   PHYSICAL EXAM:  VITAL SIGNS: ED Triage Vitals  Enc Vitals Group     BP 11/08/20 2009 (!) 177/79     Pulse Rate 11/08/20 2009 76     Resp 11/08/20 2009 18     Temp 11/08/20 2009 98.5 F (36.9 C)     Temp Source 11/08/20 2009 Oral     SpO2 11/08/20 2009 97 %     Weight 11/08/20 2007 139 lb (63 kg)     Height 11/08/20 2007 5\' 2"  (1.575 m)     Head Circumference --      Peak Flow --      Pain Score 11/08/20 2007 7      Pain Loc --      Pain Edu? --      Excl. in GC? --      Constitutional: Alert and oriented. Well appearing and in no acute distress. Eyes: Conjunctivae are normal. PERRL. EOMI. Head: Patient has abrasion to the right cheek.  This is not a frank laceration.  No foreign body.  No bleeding.  Patient is tender along the laceration along the zygomatic and inferior orbital region.  No palpable abnormality or crepitus.  No tenderness to palpation over the osseous structures of the skull and face.  No battle signs, raccoon eyes, serosanguineous fluid drainage from the ears or nares. ENT:      Ears:       Nose: No congestion/rhinnorhea.  Mouth/Throat: Mucous membranes are moist.  Neck: No stridor.  No cervical spine tenderness to palpation  Cardiovascular: Normal rate, regular rhythm. Normal S1 and S2.  Good peripheral circulation. Respiratory: Normal respiratory effort without tachypnea or retractions. Lungs CTAB. Good air entry to the bases with no decreased or absent breath sounds. Musculoskeletal: Full range of motion to all extremities. No gross deformities appreciated.  Full range of motion to the right shoulder, right wrist at this time.  No frank tenderness or palpable abnormalities to either these joints.  Patient has multiple abrasions noted to the right wrist, hand and fingers.  No active bleeding.  No visible foreign body.  Full range of motion to all digits right hand.  Sensation intact all digits.  Radial pulse and capillary refill intact Neurologic:  Normal speech and language. No gross focal neurologic deficits are appreciated.  Skin:  Skin is warm, dry and intact. No rash noted. Psychiatric: Mood and affect are normal. Speech and behavior are normal. Patient exhibits appropriate insight and judgement.   ____________________________________________   LABS (all labs ordered are listed, but only abnormal results are displayed)  Labs Reviewed - No data to  display ____________________________________________  EKG   ____________________________________________  RADIOLOGY I personally viewed and evaluated these images as part of my medical decision making, as well as reviewing the written report by the radiologist.  ED Provider Interpretation: No acute traumatic findings on imaging  CT HEAD WO CONTRAST ( )  Result Date: 11/08/2020 CLINICAL DATA:  Facial trauma. EXAM: CT HEAD WITHOUT CONTRAST CT MAXILLOFACIAL WITHOUT CONTRAST CT CERVICAL SPINE WITHOUT CONTRAST TECHNIQUE: Multidetector CT imaging of the head, cervical spine, and maxillofacial structures were performed using the standard protocol without intravenous contrast. Multiplanar CT image reconstructions of the cervical spine and maxillofacial structures were also generated. COMPARISON:  Brain MRI dated 09/01/2018. FINDINGS: CT HEAD FINDINGS Brain: Mild age-related atrophy and chronic microvascular ischemic changes. There is no acute intracranial hemorrhage. No mass effect or midline shift. No extra-axial fluid collection. Vascular: No hyperdense vessel or unexpected calcification. Skull: Normal. Negative for fracture or focal lesion. Other: None. CT MAXILLOFACIAL FINDINGS Osseous: No fracture or mandibular dislocation. No destructive process. Orbits: The globes and retro-orbital fat are preserved. Sinuses: Clear. Soft tissues: Mild right facial and periorbital contusion. CT CERVICAL SPINE FINDINGS Alignment: No acute subluxation. There is straightening of normal cervical lordosis which may be positional or due to muscle spasm. Grade 1 C3-C4 anterolisthesis. Skull base and vertebrae: No acute fracture.  Osteopenia. Soft tissues and spinal canal: No prevertebral fluid or swelling. No visible canal hematoma. Disc levels: Multilevel degenerative changes with disc space narrowing and endplate irregularity and spurring. Multilevel facet arthropathy. Upper chest: Biapical subpleural scarring. Other:  Bilateral carotid bulb calcified plaques. IMPRESSION: 1. No acute intracranial pathology. 2. No acute/traumatic cervical spine pathology. 3. No acute facial bone fractures. Electronically Signed   By: Elgie Collard M.D.   On: 11/08/2020 21:32   CT Cervical Spine Wo Contrast  Result Date: 11/08/2020 CLINICAL DATA:  Facial trauma. EXAM: CT HEAD WITHOUT CONTRAST CT MAXILLOFACIAL WITHOUT CONTRAST CT CERVICAL SPINE WITHOUT CONTRAST TECHNIQUE: Multidetector CT imaging of the head, cervical spine, and maxillofacial structures were performed using the standard protocol without intravenous contrast. Multiplanar CT image reconstructions of the cervical spine and maxillofacial structures were also generated. COMPARISON:  Brain MRI dated 09/01/2018. FINDINGS: CT HEAD FINDINGS Brain: Mild age-related atrophy and chronic microvascular ischemic changes. There is no acute intracranial hemorrhage. No mass effect or midline shift. No extra-axial  fluid collection. Vascular: No hyperdense vessel or unexpected calcification. Skull: Normal. Negative for fracture or focal lesion. Other: None. CT MAXILLOFACIAL FINDINGS Osseous: No fracture or mandibular dislocation. No destructive process. Orbits: The globes and retro-orbital fat are preserved. Sinuses: Clear. Soft tissues: Mild right facial and periorbital contusion. CT CERVICAL SPINE FINDINGS Alignment: No acute subluxation. There is straightening of normal cervical lordosis which may be positional or due to muscle spasm. Grade 1 C3-C4 anterolisthesis. Skull base and vertebrae: No acute fracture.  Osteopenia. Soft tissues and spinal canal: No prevertebral fluid or swelling. No visible canal hematoma. Disc levels: Multilevel degenerative changes with disc space narrowing and endplate irregularity and spurring. Multilevel facet arthropathy. Upper chest: Biapical subpleural scarring. Other: Bilateral carotid bulb calcified plaques. IMPRESSION: 1. No acute intracranial pathology. 2.  No acute/traumatic cervical spine pathology. 3. No acute facial bone fractures. Electronically Signed   By: Elgie Collard M.D.   On: 11/08/2020 21:32   CT Maxillofacial Wo Contrast  Result Date: 11/08/2020 CLINICAL DATA:  Facial trauma. EXAM: CT HEAD WITHOUT CONTRAST CT MAXILLOFACIAL WITHOUT CONTRAST CT CERVICAL SPINE WITHOUT CONTRAST TECHNIQUE: Multidetector CT imaging of the head, cervical spine, and maxillofacial structures were performed using the standard protocol without intravenous contrast. Multiplanar CT image reconstructions of the cervical spine and maxillofacial structures were also generated. COMPARISON:  Brain MRI dated 09/01/2018. FINDINGS: CT HEAD FINDINGS Brain: Mild age-related atrophy and chronic microvascular ischemic changes. There is no acute intracranial hemorrhage. No mass effect or midline shift. No extra-axial fluid collection. Vascular: No hyperdense vessel or unexpected calcification. Skull: Normal. Negative for fracture or focal lesion. Other: None. CT MAXILLOFACIAL FINDINGS Osseous: No fracture or mandibular dislocation. No destructive process. Orbits: The globes and retro-orbital fat are preserved. Sinuses: Clear. Soft tissues: Mild right facial and periorbital contusion. CT CERVICAL SPINE FINDINGS Alignment: No acute subluxation. There is straightening of normal cervical lordosis which may be positional or due to muscle spasm. Grade 1 C3-C4 anterolisthesis. Skull base and vertebrae: No acute fracture.  Osteopenia. Soft tissues and spinal canal: No prevertebral fluid or swelling. No visible canal hematoma. Disc levels: Multilevel degenerative changes with disc space narrowing and endplate irregularity and spurring. Multilevel facet arthropathy. Upper chest: Biapical subpleural scarring. Other: Bilateral carotid bulb calcified plaques. IMPRESSION: 1. No acute intracranial pathology. 2. No acute/traumatic cervical spine pathology. 3. No acute facial bone fractures. Electronically  Signed   By: Elgie Collard M.D.   On: 11/08/2020 21:32    ____________________________________________    PROCEDURES  Procedure(s) performed:    Procedures    Medications - No data to display   ____________________________________________   INITIAL IMPRESSION / ASSESSMENT AND PLAN / ED COURSE  Pertinent labs & imaging results that were available during my care of the patient were reviewed by me and considered in my medical decision making (see chart for details).  Review of the [place] CSRS was performed in accordance of the [facility] prior to dispensing any controlled drugs.           Patient's diagnosis is consistent with fall, multiple abrasions.  Patient presented to the emergency department after sustaining a mechanical fall.  She did hit her face and head.  No loss of consciousness at the time of or subsequently.  She denies any headache, neck pain.  Initially she had right shoulder and right wrist pain but states that this is improved and she is deferring any imaging to both of these areas.  Patient had multiple superficial abrasions that did not require closure.  Patient will have antibiotics prophylactically given the number of abrasions.  Tylenol and Motrin at home for any pain.  Return precautions discussed with the patient.  Imaging was reassuring of the head, face and neck and again patient declines any imaging of the shoulder or wrist at this time.  Follow-up primary care as needed..  Patient is given ED precautions to return to the ED for any worsening or new symptoms.     ____________________________________________  FINAL CLINICAL IMPRESSION(S) / ED DIAGNOSES  Final diagnoses:  Fall, initial encounter  Abrasion of cheek, initial encounter  Abrasions of multiple sites      NEW MEDICATIONS STARTED DURING THIS VISIT:  ED Discharge Orders          Ordered    cephALEXin (KEFLEX) 500 MG capsule  2 times daily        11/08/20 2244                 This chart was dictated using voice recognition software/Dragon. Despite best efforts to proofread, errors can occur which can change the meaning. Any change was purely unintentional.    Racheal PatchesCuthriell, Martrell Eguia D, PA-C 11/08/20 2245    Concha SeFunke, Mary E, MD 11/09/20 1122

## 2020-11-09 ENCOUNTER — Emergency Department: Payer: Medicare HMO

## 2020-11-09 ENCOUNTER — Observation Stay
Admission: EM | Admit: 2020-11-09 | Discharge: 2020-11-12 | Disposition: A | Discharge status: Home / Self Care | Payer: Medicare HMO | Attending: Internal Medicine | Admitting: Internal Medicine

## 2020-11-09 ENCOUNTER — Encounter: Payer: Self-pay | Admitting: Internal Medicine

## 2020-11-09 DIAGNOSIS — J449 Chronic obstructive pulmonary disease, unspecified: Secondary | ICD-10-CM | POA: Diagnosis not present

## 2020-11-09 DIAGNOSIS — T07XXXA Unspecified multiple injuries, initial encounter: Secondary | ICD-10-CM

## 2020-11-09 DIAGNOSIS — F0390 Unspecified dementia without behavioral disturbance: Secondary | ICD-10-CM | POA: Diagnosis present

## 2020-11-09 DIAGNOSIS — Y92009 Unspecified place in unspecified non-institutional (private) residence as the place of occurrence of the external cause: Secondary | ICD-10-CM

## 2020-11-09 DIAGNOSIS — I10 Essential (primary) hypertension: Secondary | ICD-10-CM | POA: Diagnosis present

## 2020-11-09 DIAGNOSIS — S2231XA Fracture of one rib, right side, initial encounter for closed fracture: Secondary | ICD-10-CM | POA: Diagnosis present

## 2020-11-09 DIAGNOSIS — F418 Other specified anxiety disorders: Secondary | ICD-10-CM | POA: Diagnosis present

## 2020-11-09 DIAGNOSIS — R0902 Hypoxemia: Secondary | ICD-10-CM

## 2020-11-09 DIAGNOSIS — R7989 Other specified abnormal findings of blood chemistry: Secondary | ICD-10-CM | POA: Diagnosis present

## 2020-11-09 DIAGNOSIS — J9601 Acute respiratory failure with hypoxia: Secondary | ICD-10-CM | POA: Diagnosis present

## 2020-11-09 DIAGNOSIS — S0081XA Abrasion of other part of head, initial encounter: Secondary | ICD-10-CM

## 2020-11-09 DIAGNOSIS — R296 Repeated falls: Secondary | ICD-10-CM

## 2020-11-09 DIAGNOSIS — W19XXXA Unspecified fall, initial encounter: Secondary | ICD-10-CM

## 2020-11-09 DIAGNOSIS — R778 Other specified abnormalities of plasma proteins: Secondary | ICD-10-CM | POA: Diagnosis present

## 2020-11-09 LAB — CBC WITH DIFFERENTIAL/PLATELET
Abs Immature Granulocytes: 0.02 10*3/uL (ref 0.00–0.07)
Basophils Absolute: 0 10*3/uL (ref 0.0–0.1)
Basophils Relative: 1 %
Eosinophils Absolute: 0.5 10*3/uL (ref 0.0–0.5)
Eosinophils Relative: 8 %
HCT: 37.9 % (ref 36.0–46.0)
Hemoglobin: 12.7 g/dL (ref 12.0–15.0)
Immature Granulocytes: 0 %
Lymphocytes Relative: 25 %
Lymphs Abs: 1.6 10*3/uL (ref 0.7–4.0)
MCH: 32.6 pg (ref 26.0–34.0)
MCHC: 33.5 g/dL (ref 30.0–36.0)
MCV: 97.4 fL (ref 80.0–100.0)
Monocytes Absolute: 0.6 10*3/uL (ref 0.1–1.0)
Monocytes Relative: 9 %
Neutro Abs: 3.6 10*3/uL (ref 1.7–7.7)
Neutrophils Relative %: 57 %
Platelets: 277 10*3/uL (ref 150–400)
RBC: 3.89 MIL/uL (ref 3.87–5.11)
RDW: 13.1 % (ref 11.5–15.5)
WBC: 6.2 10*3/uL (ref 4.0–10.5)
nRBC: 0 % (ref 0.0–0.2)

## 2020-11-09 LAB — TROPONIN I (HIGH SENSITIVITY)
Troponin I (High Sensitivity): 25 ng/L — ABNORMAL HIGH (ref <18)
Troponin I (High Sensitivity): 25 ng/L — ABNORMAL HIGH (ref <18)
Troponin I (High Sensitivity): 24 ng/L — ABNORMAL HIGH (ref <18)

## 2020-11-09 LAB — BASIC METABOLIC PANEL
Anion gap: 9 (ref 5–15)
BUN: 18 mg/dL (ref 8–23)
CO2: 31 mmol/L (ref 22–32)
Calcium: 9.3 mg/dL (ref 8.9–10.3)
Chloride: 99 mmol/L (ref 98–111)
Creatinine, Ser: 0.73 mg/dL (ref 0.44–1.00)
GFR, Estimated: >60 mL/min (ref >60)
Glucose, Bld: 106 mg/dL — ABNORMAL HIGH (ref 70–99)
Potassium: 3.8 mmol/L (ref 3.5–5.1)
Sodium: 139 mmol/L (ref 135–145)

## 2020-11-09 LAB — PROTIME-INR
INR: 0.9 (ref 0.8–1.2)
Prothrombin Time: 11.7 seconds (ref 11.4–15.2)

## 2020-11-09 LAB — BRAIN NATRIURETIC PEPTIDE: B Natriuretic Peptide: 146.3 pg/mL — ABNORMAL HIGH (ref 0.0–100.0)

## 2020-11-09 LAB — HEMOGLOBIN A1C
Hgb A1c MFr Bld: 6.1 % — ABNORMAL HIGH (ref 4.8–5.6)
Mean Plasma Glucose: 128.37 mg/dL

## 2020-11-09 LAB — RESP PANEL BY RT-PCR (FLU A&B, COVID) ARPGX2
Influenza A by PCR: NEGATIVE
Influenza B by PCR: NEGATIVE
SARS Coronavirus 2 by RT PCR: NEGATIVE

## 2020-11-09 MED ORDER — BUPROPION HCL ER (XL) 150 MG PO TB24
150.0000 mg | ORAL_TABLET | Freq: Every day | ORAL | Status: DC
  Administered 2020-11-09 – 2020-11-12 (×4): 150 mg via ORAL
  Filled 2020-11-09 (×4): qty 1

## 2020-11-09 MED ORDER — MOMETASONE FURO-FORMOTEROL FUM 200-5 MCG/ACT IN AERO
2.0000 | INHALATION_SPRAY | Freq: Two times a day (BID) | RESPIRATORY_TRACT | Status: DC
  Administered 2020-11-10 – 2020-11-12 (×5): 2 via RESPIRATORY_TRACT
  Filled 2020-11-09: qty 8.8

## 2020-11-09 MED ORDER — DULOXETINE HCL 60 MG PO CPEP
60.0000 mg | ORAL_CAPSULE | Freq: Every day | ORAL | Status: DC
  Administered 2020-11-09 – 2020-11-12 (×4): 60 mg via ORAL
  Filled 2020-11-09 (×4): qty 1

## 2020-11-09 MED ORDER — RIVASTIGMINE TARTRATE 1.5 MG PO CAPS
1.5000 mg | ORAL_CAPSULE | Freq: Two times a day (BID) | ORAL | Status: DC
  Administered 2020-11-09 – 2020-11-12 (×6): 1.5 mg via ORAL
  Filled 2020-11-09 (×8): qty 1

## 2020-11-09 MED ORDER — ACETAMINOPHEN 500 MG PO TABS
1000.0000 mg | ORAL_TABLET | Freq: Once | ORAL | Status: AC
  Administered 2020-11-09: 1000 mg via ORAL
  Filled 2020-11-09: qty 2

## 2020-11-09 MED ORDER — HYDRALAZINE HCL 20 MG/ML IJ SOLN
5.0000 mg | INTRAMUSCULAR | Status: DC | PRN
  Administered 2020-11-09 (×2): 5 mg via INTRAVENOUS
  Filled 2020-11-09 (×2): qty 1

## 2020-11-09 MED ORDER — ASPIRIN EC 81 MG PO TBEC
81.0000 mg | DELAYED_RELEASE_TABLET | Freq: Every day | ORAL | Status: DC
  Administered 2020-11-12: 81 mg via ORAL
  Filled 2020-11-09 (×3): qty 1

## 2020-11-09 MED ORDER — OXCARBAZEPINE 150 MG PO TABS
150.0000 mg | ORAL_TABLET | Freq: Two times a day (BID) | ORAL | Status: DC
Start: 2020-11-09 — End: 2020-11-09

## 2020-11-09 MED ORDER — ENOXAPARIN SODIUM 40 MG/0.4ML IJ SOSY
40.0000 mg | PREFILLED_SYRINGE | INTRAMUSCULAR | Status: DC
  Administered 2020-11-09 – 2020-11-11 (×3): 40 mg via SUBCUTANEOUS
  Filled 2020-11-09 (×3): qty 0.4

## 2020-11-09 MED ORDER — FENTANYL CITRATE PF 50 MCG/ML IJ SOSY
12.5000 ug | PREFILLED_SYRINGE | INTRAMUSCULAR | Status: DC | PRN
Start: 2020-11-09 — End: 2020-11-12

## 2020-11-09 MED ORDER — MEMANTINE HCL 5 MG PO TABS
10.0000 mg | ORAL_TABLET | Freq: Two times a day (BID) | ORAL | Status: DC
  Administered 2020-11-09 – 2020-11-12 (×6): 10 mg via ORAL
  Filled 2020-11-09 (×6): qty 2

## 2020-11-09 MED ORDER — IRBESARTAN 150 MG PO TABS
300.0000 mg | ORAL_TABLET | Freq: Every day | ORAL | Status: DC
  Administered 2020-11-10 – 2020-11-12 (×3): 300 mg via ORAL
  Filled 2020-11-09 (×4): qty 2

## 2020-11-09 MED ORDER — IPRATROPIUM-ALBUTEROL 0.5-2.5 (3) MG/3ML IN SOLN
3.0000 mL | Freq: Four times a day (QID) | RESPIRATORY_TRACT | Status: DC
  Administered 2020-11-09 (×2): 3 mL via RESPIRATORY_TRACT
  Filled 2020-11-09 (×2): qty 3

## 2020-11-09 MED ORDER — FENTANYL CITRATE (PF) 100 MCG/2ML IJ SOLN: 12.5000 ug | INTRAMUSCULAR | Status: DC | PRN

## 2020-11-09 MED ORDER — BISOPROLOL FUMARATE 5 MG PO TABS
5.0000 mg | ORAL_TABLET | Freq: Every day | ORAL | Status: DC
  Administered 2020-11-09 – 2020-11-12 (×4): 5 mg via ORAL
  Filled 2020-11-09 (×5): qty 1

## 2020-11-09 MED ORDER — ONDANSETRON HCL 4 MG/2ML IJ SOLN: 4.0000 mg | Freq: Three times a day (TID) | INTRAMUSCULAR | Status: DC | PRN

## 2020-11-09 MED ORDER — DM-GUAIFENESIN ER 30-600 MG PO TB12
1.0000 | ORAL_TABLET | Freq: Two times a day (BID) | ORAL | Status: DC | PRN
  Administered 2020-11-11: 1 via ORAL
  Filled 2020-11-09 (×2): qty 1

## 2020-11-09 MED ORDER — OXCARBAZEPINE 300 MG PO TABS
300.0000 mg | ORAL_TABLET | Freq: Every day | ORAL | Status: DC
  Administered 2020-11-09 – 2020-11-11 (×3): 300 mg via ORAL
  Filled 2020-11-09 (×4): qty 1

## 2020-11-09 MED ORDER — GABAPENTIN 100 MG PO CAPS
100.0000 mg | ORAL_CAPSULE | Freq: Two times a day (BID) | ORAL | Status: DC
  Administered 2020-11-09 – 2020-11-12 (×6): 100 mg via ORAL
  Filled 2020-11-09 (×6): qty 1

## 2020-11-09 MED ORDER — CLONAZEPAM 0.5 MG PO TABS
0.5000 mg | ORAL_TABLET | Freq: Two times a day (BID) | ORAL | Status: DC
  Administered 2020-11-09 – 2020-11-12 (×6): 0.5 mg via ORAL
  Filled 2020-11-09 (×6): qty 1

## 2020-11-09 MED ORDER — HYDROCODONE-ACETAMINOPHEN 5-325 MG PO TABS
1.0000 | ORAL_TABLET | Freq: Four times a day (QID) | ORAL | Status: DC | PRN
  Administered 2020-11-09 – 2020-11-10 (×4): 1 via ORAL
  Filled 2020-11-09 (×4): qty 1

## 2020-11-09 MED ORDER — OXCARBAZEPINE 150 MG PO TABS
150.0000 mg | ORAL_TABLET | Freq: Every day | ORAL | Status: DC
  Administered 2020-11-10 – 2020-11-12 (×3): 150 mg via ORAL
  Filled 2020-11-09 (×3): qty 1

## 2020-11-09 MED ORDER — ACETAMINOPHEN 160 MG/5ML PO SOLN
650.0000 mg | Freq: Four times a day (QID) | ORAL | Status: DC | PRN
  Filled 2020-11-09: qty 20.3

## 2020-11-09 MED ORDER — ALBUTEROL SULFATE (2.5 MG/3ML) 0.083% IN NEBU: 2.5000 mg | INHALATION_SOLUTION | RESPIRATORY_TRACT | Status: DC | PRN

## 2020-11-09 MED ORDER — AMLODIPINE BESYLATE 10 MG PO TABS
10.0000 mg | ORAL_TABLET | Freq: Every day | ORAL | Status: DC
  Administered 2020-11-09 – 2020-11-12 (×4): 10 mg via ORAL
  Filled 2020-11-09 (×4): qty 1

## 2020-11-09 NOTE — ED Notes (Signed)
Meal tray given 

## 2020-11-09 NOTE — Evaluation (Signed)
Physical Therapy Evaluation Patient Details Name: Samantha Carpenter MRN: [id number] DOB: [birth year] Today's Date: 11/09/2020   History of Present Illness  80 y.o. female with history of dementia, hypertension, COPD, chronic kidney disease who was initially here in the emergency department after she had a mechanical fall.  Imaging was unremarkable and patient was waiting for a ride home.  States she got up from her chair to go to the bathroom and it "sprung me forward" and she fell.  She is not sure if she hit her head but denies headache, neck pain, loss of consciousness. Pt with new R 7th rib fx.  Clinical Impression  Pt is a pleasant 80 year old female who was admitted for R rib fracture. Pt performs bed mobility with mod I, transfers with cga and ambulation with cga and RW. Pt is indep at baseline. Pt demonstrates deficits with strength/balance/mobility. Educated on continued use of RW in home setting for fall prevention. Would benefit from skilled PT to address above deficits and promote optimal return to PLOF. Recommend transition to HHPT upon discharge from acute hospitalization.     Follow Up Recommendations Home health PT    Equipment Recommendations  Rolling walker with 5" wheels    Recommendations for Other Services       Precautions / Restrictions Precautions Precautions: Fall Restrictions Weight Bearing Restrictions: No      Mobility  Bed Mobility Overal bed mobility: Modified Independent             General bed mobility comments: ED stretcher    Transfers Overall transfer level: Needs assistance Equipment used: None Transfers: Sit to/from Stand Sit to Stand: Min guard         General transfer comment: post lean once standing with cga for correction.  Ambulation/Gait Ambulation/Gait assistance: Min guard Gait Distance (Feet): 100 Feet Assistive device: Rolling walker (2 wheeled) Gait Pattern/deviations: Step-through pattern     General Gait Details:  ambulated in hallway with safe technique. RW used with cues for sequencing. No SOB symptoms, however does complain of rib pain.  Stairs            Wheelchair Mobility    Modified Rankin (Stroke Patients Only)       Balance Overall balance assessment: Needs assistance Sitting-balance support: Feet unsupported;No upper extremity supported Sitting balance-Leahy Scale: Good     Standing balance support: Bilateral upper extremity supported Standing balance-Leahy Scale: Fair Standing balance comment: unsteadiness present                             Pertinent Vitals/Pain Pain Assessment: Faces Pain Score: 5  Faces Pain Scale: Hurts a little bit Pain Location: R rib Pain Descriptors / Indicators: Aching;Discomfort Pain Intervention(s): Limited activity within patient's tolerance;Repositioned    Home Living Family/patient expects to be discharged to:: Private residence Living Arrangements: Alone   Type of Home: Apartment Home Access: Level entry     Home Layout: One level Home Equipment: Cane - single point;Shower seat;Walker - standard;Wheelchair - manual      Prior Function Level of Independence: Independent         Comments: Pt reports living at home alone since husband passed 3 months ago. Pt is Ind in all aspects of ADLs and IADLs. She still drives and cares for her dog and cat at home. She has one sister nearby but she is unable to offer assistance.     Hand Dominance   Dominant  Hand: Right    Extremity/Trunk Assessment   Upper Extremity Assessment Upper Extremity Assessment: Overall WFL for tasks assessed    Lower Extremity Assessment Lower Extremity Assessment: Overall WFL for tasks assessed       Communication   Communication: No difficulties  Cognition Arousal/Alertness: Awake/alert Behavior During Therapy: WFL for tasks assessed/performed Overall Cognitive Status: Within Functional Limits for tasks assessed                                  General Comments: sleeping upon arrival, however easily awakens. Pleasant and agreeable to session      General Comments General comments (skin integrity, edema, etc.): no LOB this session but OT recommends use of RW for additional support at discharge    Exercises Other Exercises Other Exercises: Pt assisted to sitting at bedside and lunch tray placed in front of her.   Assessment/Plan    PT Assessment Patient needs continued PT services  PT Problem List Decreased strength;Decreased balance;Decreased mobility;Pain       PT Treatment Interventions DME instruction;Therapeutic exercise;Balance training    PT Goals (Current goals can be found in the Care Plan section)  Acute Rehab PT Goals Patient Stated Goal: go home PT Goal Formulation: With patient Time For Goal Achievement: 11/23/20 Potential to Achieve Goals: Good    Frequency 7X/week   Barriers to discharge        Co-evaluation               AM-PAC PT "6 Clicks" Mobility  Outcome Measure Help needed turning from your back to your side while in a flat bed without using bedrails?: None Help needed moving from lying on your back to sitting on the side of a flat bed without using bedrails?: A Little Help needed moving to and from a bed to a chair (including a wheelchair)?: A Little Help needed standing up from a chair using your arms (e.g., wheelchair or bedside chair)?: A Little Help needed to walk in hospital room?: A Little Help needed climbing 3-5 steps with a railing? : A Little 6 Click Score: 19    End of Session Equipment Utilized During Treatment: Gait belt Activity Tolerance: Patient tolerated treatment well Patient left: in bed Nurse Communication: Mobility status PT Visit Diagnosis: Unsteadiness on feet (R26.81);Repeated falls (R29.6);Pain Pain - Right/Left: Right Pain - part of body:  (ribs)    Time: 1761-6073 PT Time Calculation (min) (ACUTE ONLY): 16  min   Charges:   PT Evaluation $PT Eval Low Complexity: 1 Low PT Treatments $Gait Training: 8-22 mins        Samantha Carpenter, PT, DPT [phone]   Samantha Carpenter 11/09/2020, 2:03 PM

## 2020-11-09 NOTE — ED Notes (Signed)
Patient return from radiology.

## 2020-11-09 NOTE — H&P (Signed)
History and Physical    Samantha Carpenter ZOX:[id number] DOB: [birth year] DOA: 11/09/2020  Referring MD/NP/PA:   PCP: Marguarite ArbourSparks, Jeffrey D, MD   Patient coming from:  The patient is coming from home.      Chief Complaint: fall   HPI: Samantha Carpenter is a 80 y.o. female with medical history significant of COPD not on oxygen at home, hyperlipidemia, depression, anxiety, dementia, who presents with fall.  Pt was initially presented to ED due to mechanical fall. The patient states that she fell accidentally when she was walking outside and tripped on the curb yesterday morning.  She has small area of skin abrasion in right cheek and right hand.  No LOC.  No unilateral numbness or tingling in extremities.  No facial droop or slurred speech.  The initial images were unremarkable, including CT of head, CT of C-spine and CT of maxillofacial image. Per report, she got up from her chair to go to the bathroom, and fell again in ED. She has pain in right hand, right knee and right wrist. She also complains of right rib cage pain.  She has mild dry cough and mild shortness of breath which she attributes to COPD.  No fever or chills.  Denies nausea, vomiting, diarrhea or abdominal pain.  No symptoms of UTI.  Patient was found to have oxygen saturation 96% on room air, which decreased to 85 on ambulation ED.  She is not using oxygen at home.  ED Course: pt was found to have WBC 6.2, troponin level 25 --> 25, negative COVID PCR, electrolytes renal function okay, GFR> 60, temperature normal, blood pressure 218/75, 179/77, heart rate 83, RR 18.  The repeated the CTA of head and CT of the C-spine negative.  CT of chest showed right seventh rib fracture.  Negative x-ray for right hand, right knee and right wrist.  Patient is placed on MedSurg bed for observation.   Review of Systems:   General: no fevers, chills, no body weight gain, has fatigue HEENT: no blurry vision, hearing changes or sore throat Respiratory: has  dyspnea, coughing, no wheezing CV: has right rib cage pain. no palpitations GI: no nausea, vomiting, abdominal pain, diarrhea, constipation GU: no dysuria, burning on urination, increased urinary frequency, hematuria  Ext: no leg edema Neuro: no unilateral weakness, numbness, or tingling, no vision change or hearing loss Skin: no rash. Has small skin abrasion in right cheek and right hand MSK: Has pain in right hand, right knee and right wrist Heme: No easy bruising.  Travel history: No recent long distant travel.  Allergy:  Allergies  Allergen Reactions   Morphine And Related    Latex Rash    "I break out"    Past Medical History:  Diagnosis Date   Chronic kidney disease    COPD (chronic obstructive pulmonary disease) (HCC)    Dementia (HCC)    Depression    Hypertension    Liver disease     Past Surgical History:  Procedure Laterality Date   ABDOMINAL HYSTERECTOMY     CHOLECYSTECTOMY     ESOPHAGOGASTRODUODENOSCOPY N/A 11/20/2019   Procedure: ESOPHAGOGASTRODUODENOSCOPY (EGD);  Surgeon: Regis BillLocklear, Cameron T, MD;  [facility];  Service: Gastroenterology;  Laterality: N/A;   face tuck      Social History:  reports that she quit smoking about 11 years ago. She has never used smokeless tobacco. She reports that she does not currently use alcohol. She reports that she does not currently use drugs.  Family  History:  Family History  Problem Relation Age of Onset   Bipolar disorder Brother    Schizophrenia Brother    Bipolar disorder Sister    Schizophrenia Sister    Drug abuse Son      Prior to Admission medications   Medication Sig Start Date End Date Taking? Authorizing Provider  cephALEXin (KEFLEX) 500 MG capsule Take 2 capsules (1,000 mg total) by mouth 2 (two) times daily. 11/08/20  Yes Cuthriell, Delorise Royals, PA-C  acyclovir (ZOVIRAX) 800 MG tablet Take by mouth. Patient not taking: Reported on 11/20/2019 06/18/18   [provider]  amLODipine  (NORVASC) 10 MG tablet Take 1 tablet (10 mg total) by mouth daily. 08/27/20 09/26/20  Sharman Cheek, MD  aspirin EC 81 MG tablet Take by mouth.    [provider]  buPROPion (WELLBUTRIN XL) 150 MG 24 hr tablet Take 150 mg by mouth daily. 10/27/19   [provider]  clonazePAM (KLONOPIN) 1 MG tablet Take by mouth 2 (two) times daily.  05/08/18   [provider]  DULoxetine (CYMBALTA) 60 MG capsule Take by mouth daily.  08/25/18   [provider]  fluticasone-salmeterol (ADVAIR HFA) 115-21 MCG/ACT inhaler Inhale 2 puffs into the lungs every 12 (twelve) hours. 01/07/19 01/07/20  [provider]  gabapentin (NEURONTIN) 100 MG capsule Take by mouth 2 (two) times daily.  06/18/18   [provider]  HYDROcodone-acetaminophen (NORCO/VICODIN) 5-325 MG tablet Take 1 tablet by mouth every 6 (six) hours as needed. 06/25/19   [provider]  irbesartan (AVAPRO) 300 MG tablet Take 300 mg by mouth daily.  04/28/18 11/20/19  [provider]  lidocaine (LIDODERM) 5 % Place 1 patch onto the skin daily. Remove & Discard patch within 12 hours or as directed by MD Patient not taking: Reported on 11/20/2019 05/11/19   Irean Hong, MD  memantine (NAMENDA) 10 MG tablet Take 10 mg by mouth 2 (two) times daily.  06/18/18   [provider]  OXcarbazepine (TRILEPTAL) 150 MG tablet 1 po qam qnd 2 po qhs 06/12/18   [provider]  traMADol (ULTRAM) 50 MG tablet Take 1 tablet (50 mg total) by mouth every 6 (six) hours as needed. Patient not taking: Reported on 11/20/2019 05/11/19   Irean Hong, MD    Physical Exam: Vitals:   11/09/20 1610 11/09/20 0742 11/09/20 1230 11/09/20 1449  BP: (!) 188/70 (!) 179/77 (!) 187/74 (!) 197/61  Pulse: 83 80 84 80  Resp: Temp:  98.4 F (36.9 C)    TempSrc:  Oral    SpO2: 95% 96% 96% 97%  Weight:      Height:       General: Not in acute distress HEENT:       Eyes: PERRL, EOMI, no scleral  icterus.       ENT: No discharge from the ears and nose, no pharynx injection, no tonsillar enlargement.        Neck: No JVD, no bruit, no mass felt. Heme: No neck lymph node enlargement. Cardiac: S1/S2, RRR, No murmurs, No gallops or rubs. Respiratory: No rales, wheezing, rhonchi or rubs. GI: Soft, nondistended, nontender, no rebound pain, no organomegaly, BS present. GU: No hematuria Ext: No pitting leg edema bilaterally. 1+DP/PT pulse bilaterally. Musculoskeletal: No joint deformities, No joint redness or warmth, no limitation of ROM in spin. Skin: No rashes. Has small skin abrasion in right cheek and right hand Neuro: Alert, oriented X3, cranial nerves  II-XII grossly intact, moves all extremities normally.  Psych: Patient is not psychotic, no suicidal or hemocidal ideation.  Labs on Admission: I have personally reviewed following labs and imaging studies  CBC: Recent Labs  Lab 11/09/20 0600  WBC 6.2  NEUTROABS 3.6  HGB 12.7  HCT 37.9  MCV 97.4  PLT 277   Basic Metabolic Panel: Recent Labs  Lab 11/09/20 0600  NA 139  K 3.8  CL 99  CO2 31  GLUCOSE 106*  BUN 18  CREATININE 0.73  CALCIUM 9.3   GFR: Estimated Creatinine Clearance: 48.1 mL/min (by C-G formula based on SCr of 0.73 mg/dL). Liver Function Tests: No results for input(s): AST, ALT, ALKPHOS, BILITOT, PROT, ALBUMIN in the last 168 hours. No results for input(s): LIPASE, AMYLASE in the last 168 hours. No results for input(s): AMMONIA in the last 168 hours. Coagulation Profile: Recent Labs  Lab 11/09/20 1229  INR 0.9   Cardiac Enzymes: No results for input(s): CKTOTAL, CKMB, CKMBINDEX, TROPONINI in the last 168 hours. BNP (last 3 results) No results for input(s): PROBNP in the last 8760 hours. HbA1C: No results for input(s): HGBA1C in the last 72 hours. CBG: No results for input(s): GLUCAP in the last 168 hours. Lipid Profile: No results for input(s): CHOL, HDL, LDLCALC, TRIG, CHOLHDL, LDLDIRECT in  the last 72 hours. Thyroid Function Tests: No results for input(s): TSH, T4TOTAL, FREET4, T3FREE, THYROIDAB in the last 72 hours. Anemia Panel: No results for input(s): VITAMINB12, FOLATE, FERRITIN, TIBC, IRON, RETICCTPCT in the last 72 hours. Urine analysis: No results found for: COLORURINE, APPEARANCEUR, LABSPEC, PHURINE, GLUCOSEU, HGBUR, BILIRUBINUR, KETONESUR, PROTEINUR, UROBILINOGEN, NITRITE, LEUKOCYTESUR Sepsis Labs: @LABRCNTIP (procalcitonin:4,lacticidven:4) ) Recent Results (from the past 240 hour(s))  Resp Panel by RT-PCR (Flu A&B, Covid) Nasopharyngeal Swab     Status: None   Collection Time: 11/09/20  6:00 AM   Specimen: Nasopharyngeal Swab; Nasopharyngeal(NP) swabs in vial transport medium  Result Value Ref Range Status   SARS Coronavirus 2 by RT PCR NEGATIVE NEGATIVE Final    Comment: (NOTE) SARS-CoV-2 target nucleic acids are NOT DETECTED.  The SARS-CoV-2 RNA is generally detectable in upper respiratory specimens during the acute phase of infection. The lowest concentration of SARS-CoV-2 viral copies this assay can detect is 138 copies/mL. A negative result does not preclude SARS-Cov-2 infection and should not be used as the sole basis for treatment or other patient management decisions. A negative result may occur with  improper specimen collection/handling, submission of specimen other than nasopharyngeal swab, presence of viral mutation(s) within the areas targeted by this assay, and inadequate number of viral copies(<138 copies/mL). A negative result must be combined with clinical observations, patient history, and epidemiological information. The expected result is Negative.  Fact Sheet for Patients:  11/11/20  Fact Sheet for Healthcare Providers:  BloggerCourse.com  This test is no t yet approved or cleared by the [place] FDA and  has been authorized for detection and/or diagnosis of SARS-CoV-2  by FDA under an Emergency Use Authorization (EUA). This EUA will remain  in effect (meaning this test can be used) for the duration of the COVID-19 declaration under Section 564(b)(1) of the Act, 21 U.S.C.section 360bbb-3(b)(1), unless the authorization is terminated  or revoked sooner.       Influenza A by PCR NEGATIVE NEGATIVE Final   Influenza B by PCR NEGATIVE NEGATIVE Final    Comment: (NOTE) The Xpert Xpress SARS-CoV-2/FLU/RSV plus assay is intended as an aid in the diagnosis of influenza from Nasopharyngeal swab  specimens and should not be used as a sole basis for treatment. Nasal washings and aspirates are unacceptable for Xpert Xpress SARS-CoV-2/FLU/RSV testing.  Fact Sheet for Patients: BloggerCourse.com  Fact Sheet for Healthcare Providers: SeriousBroker.it  This test is not yet approved or cleared by the [place] FDA and has been authorized for detection and/or diagnosis of SARS-CoV-2 by FDA under an Emergency Use Authorization (EUA). This EUA will remain in effect (meaning this test can be used) for the duration of the COVID-19 declaration under Section 564(b)(1) of the Act, 21 U.S.C. section 360bbb-3(b)(1), unless the authorization is terminated or revoked.  Performed at [facility], [address], [place], [place] [address]      Radiological Exams on Admission: DG Wrist Complete Right  Result Date: 11/09/2020 CLINICAL DATA:  Fall and trauma to the right hand. EXAM: RIGHT WRIST - COMPLETE 3+ VIEW; RIGHT HAND - COMPLETE 3+ VIEW COMPARISON:  None. FINDINGS: There is no acute fracture or dislocation. The bones are osteopenic. There is arthritic changes of the distal interphalangeal joints. The soft tissues are unremarkable. IMPRESSION: No acute fracture or dislocation. Electronically Signed   By: Elgie Collard M.D.   On: 11/09/2020 03:17   CT HEAD WO CONTRAST ( )  Result Date:  11/09/2020 CLINICAL DATA:  80 year old female status post fall out of chair. EXAM: CT HEAD WITHOUT CONTRAST TECHNIQUE: Contiguous axial images were obtained from the base of the skull through the vertex without intravenous contrast. COMPARISON:  Brain MRI 09/01/2018.  Head CT 11/08/2020. FINDINGS: Brain: No midline shift, ventriculomegaly, mass effect, evidence of mass lesion, intracranial hemorrhage or evidence of cortically based acute infarction. Stable patchy and confluent bilateral cerebral white matter hypodensity since yesterday. Vascular: Calcified atherosclerosis at the skull base. No suspicious intracranial vascular hyperdensity. Skull: Stable comment intact. Sinuses/Orbits: Visualized paranasal sinuses and mastoids are stable and well aerated. Other: No orbit or scalp soft tissue injury identified. IMPRESSION: Stable. No acute intracranial abnormality or acute traumatic injury identified. Electronically Signed   By: Odessa Fleming M.D.   On: 11/09/2020 04:06   CT HEAD WO CONTRAST ( )  Result Date: 11/08/2020 CLINICAL DATA:  Facial trauma. EXAM: CT HEAD WITHOUT CONTRAST CT MAXILLOFACIAL WITHOUT CONTRAST CT CERVICAL SPINE WITHOUT CONTRAST TECHNIQUE: Multidetector CT imaging of the head, cervical spine, and maxillofacial structures were performed using the standard protocol without intravenous contrast. Multiplanar CT image reconstructions of the cervical spine and maxillofacial structures were also generated. COMPARISON:  Brain MRI dated 09/01/2018. FINDINGS: CT HEAD FINDINGS Brain: Mild age-related atrophy and chronic microvascular ischemic changes. There is no acute intracranial hemorrhage. No mass effect or midline shift. No extra-axial fluid collection. Vascular: No hyperdense vessel or unexpected calcification. Skull: Normal. Negative for fracture or focal lesion. Other: None. CT MAXILLOFACIAL FINDINGS Osseous: No fracture or mandibular dislocation. No destructive process. Orbits: The globes and  retro-orbital fat are preserved. Sinuses: Clear. Soft tissues: Mild right facial and periorbital contusion. CT CERVICAL SPINE FINDINGS Alignment: No acute subluxation. There is straightening of normal cervical lordosis which may be positional or due to muscle spasm. Grade 1 C3-C4 anterolisthesis. Skull base and vertebrae: No acute fracture.  Osteopenia. Soft tissues and spinal canal: No prevertebral fluid or swelling. No visible canal hematoma. Disc levels: Multilevel degenerative changes with disc space narrowing and endplate irregularity and spurring. Multilevel facet arthropathy. Upper chest: Biapical subpleural scarring. Other: Bilateral carotid bulb calcified plaques. IMPRESSION: 1. No acute intracranial pathology. 2. No acute/traumatic cervical spine pathology. 3. No acute facial bone fractures. Electronically  Signed   By: Elgie Collard M.D.   On: 11/08/2020 21:32   CT CHEST WO CONTRAST  Result Date: 11/09/2020 CLINICAL DATA:  80 year old female status post fall out of chair. Right breast pain, rib fracture suspected. EXAM: CT CHEST WITHOUT CONTRAST TECHNIQUE: Multidetector CT imaging of the chest was performed following the standard protocol without IV contrast. COMPARISON:  Chest radiographs 05/28/2019. FINDINGS: Cardiovascular: Calcified aortic atherosclerosis. Calcified coronary artery atherosclerosis. No cardiomegaly or pericardial effusion. Mediastinum/Nodes: No mediastinal hematoma or lymphadenopathy is evident in the absence of IV contrast. Lungs/Pleura: Major airways are patent. Centrilobular upper lobe emphysema with confluent biapical scarring, partially calcified on the right. No pneumothorax. No pleural effusion. No pulmonary contusion. No lung nodule or acute pulmonary opacity identified. Upper Abdomen: Absent gallbladder. Negative visible noncontrast liver, spleen, pancreas, adrenal glands, kidneys, and bowel in the upper abdomen. Musculoskeletal: Subtle anterolisthesis of T11 on T12 is  associated with chronic facet hypertrophy and degeneration there. Mild chronic appearing T7 and T9 superior endplate compression and/or Schmorl's nodes. Visible shoulder osseous structures appear intact. There is osteopenia of the ribs. No convincing anterior or lateral right rib fracture. However, there is a nondisplaced fracture of the right posterior 7th rib on series 4, image 62. No contralateral acute left rib fracture is identified. No superficial soft tissue injury identified. IMPRESSION: 1. Osteopenia of the ribs, with no convincing anterolateral right rib fracture, however, there is a nondisplaced fracture of the right posterior 7th rib. 2. No other acute traumatic injury identified in the noncontrast chest. 3. Aortic Atherosclerosis (ICD10-I70.0) and Emphysema (ICD10-J43.9). Electronically Signed   By: Odessa Fleming M.D.   On: 11/09/2020 04:19   CT Cervical Spine Wo Contrast  Result Date: 11/09/2020 CLINICAL DATA:  80 year old female status post fall out of chair. EXAM: CT CERVICAL SPINE WITHOUT CONTRAST TECHNIQUE: Multidetector CT imaging of the cervical spine was performed without intravenous contrast. Multiplanar CT image reconstructions were also generated. COMPARISON:  CT face and cervical spine 11/08/2020. FINDINGS: Alignment: Stable. Mild straightening of cervical lordosis with mild anterolisthesis of C3 on C4. Subtle retrolisthesis of C4 on C5 and C5 on C6. Bilateral posterior element alignment is within normal limits. Skull base and vertebrae: Visualized skull base is intact. No atlanto-occipital dissociation. C1 and C2 appear intact and aligned. No acute osseous abnormality identified. Soft tissues and spinal canal: No prevertebral fluid or swelling. No visible canal hematoma. Dominant left vertebral artery. Bulky calcified carotid atherosclerosis in the bilateral neck. Disc levels: Stable cervical spine degeneration, including severe facet arthropathy on the right at C3-C4 with vacuum facet there.  Upper chest: The T1 level appear stable and intact. Chest CT is reported separately. IMPRESSION: 1. No acute traumatic injury identified in the cervical spine. 2. Bulky calcified carotid atherosclerosis in the neck. Electronically Signed   By: Odessa Fleming M.D.   On: 11/09/2020 04:10   CT Cervical Spine Wo Contrast  Result Date: 11/08/2020 CLINICAL DATA:  Facial trauma. EXAM: CT HEAD WITHOUT CONTRAST CT MAXILLOFACIAL WITHOUT CONTRAST CT CERVICAL SPINE WITHOUT CONTRAST TECHNIQUE: Multidetector CT imaging of the head, cervical spine, and maxillofacial structures were performed using the standard protocol without intravenous contrast. Multiplanar CT image reconstructions of the cervical spine and maxillofacial structures were also generated. COMPARISON:  Brain MRI dated 09/01/2018. FINDINGS: CT HEAD FINDINGS Brain: Mild age-related atrophy and chronic microvascular ischemic changes. There is no acute intracranial hemorrhage. No mass effect or midline shift. No extra-axial fluid collection. Vascular: No hyperdense vessel or unexpected calcification. Skull: Normal. Negative  for fracture or focal lesion. Other: None. CT MAXILLOFACIAL FINDINGS Osseous: No fracture or mandibular dislocation. No destructive process. Orbits: The globes and retro-orbital fat are preserved. Sinuses: Clear. Soft tissues: Mild right facial and periorbital contusion. CT CERVICAL SPINE FINDINGS Alignment: No acute subluxation. There is straightening of normal cervical lordosis which may be positional or due to muscle spasm. Grade 1 C3-C4 anterolisthesis. Skull base and vertebrae: No acute fracture.  Osteopenia. Soft tissues and spinal canal: No prevertebral fluid or swelling. No visible canal hematoma. Disc levels: Multilevel degenerative changes with disc space narrowing and endplate irregularity and spurring. Multilevel facet arthropathy. Upper chest: Biapical subpleural scarring. Other: Bilateral carotid bulb calcified plaques. IMPRESSION: 1. No  acute intracranial pathology. 2. No acute/traumatic cervical spine pathology. 3. No acute facial bone fractures. Electronically Signed   By: Elgie Collard M.D.   On: 11/08/2020 21:32   DG Knee Complete 4 Views Right  Result Date: 11/09/2020 CLINICAL DATA:  Fall, right knee pain EXAM: RIGHT KNEE - COMPLETE 4+ VIEW COMPARISON:  None. 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 And ossific density is seen within the intercondylar notch in keeping with and intra-articular loose body. No acute fracture or dislocation. No effusion. Soft tissues are unremarkable. IMPRESSION: No acute fracture or dislocation. 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 Electronically Signed   By: Helyn Numbers M.D.   On: 11/09/2020 03:18   DG Hand Complete Right  Result Date: 11/09/2020 CLINICAL DATA:  Fall and trauma to the right hand. EXAM: RIGHT WRIST - COMPLETE 3+ VIEW; RIGHT HAND - COMPLETE 3+ VIEW COMPARISON:  None. FINDINGS: There is no acute fracture or dislocation. The bones are osteopenic. There is arthritic changes of the distal interphalangeal joints. The soft tissues are unremarkable. IMPRESSION: No acute fracture or dislocation. Electronically Signed   By: Elgie Collard M.D.   On: 11/09/2020 03:17   CT Maxillofacial Wo Contrast  Result Date: 11/08/2020 CLINICAL DATA:  Facial trauma. EXAM: CT HEAD WITHOUT CONTRAST CT MAXILLOFACIAL WITHOUT CONTRAST CT CERVICAL SPINE WITHOUT CONTRAST TECHNIQUE: Multidetector CT imaging of the head, cervical spine, and maxillofacial structures were performed using the standard protocol without intravenous contrast. Multiplanar CT image reconstructions of the cervical spine and maxillofacial structures were also generated. COMPARISON:  Brain MRI dated 09/01/2018. FINDINGS: CT HEAD FINDINGS Brain: Mild age-related atrophy and chronic  microvascular ischemic changes. There is no acute intracranial hemorrhage. No mass effect or midline shift. No extra-axial fluid collection. Vascular: No hyperdense vessel or unexpected calcification. Skull: Normal. Negative for fracture or focal lesion. Other: None. CT MAXILLOFACIAL FINDINGS Osseous: No fracture or mandibular dislocation. No destructive process. Orbits: The globes and retro-orbital fat are preserved. Sinuses: Clear. Soft tissues: Mild right facial and periorbital contusion. CT CERVICAL SPINE FINDINGS Alignment: No acute subluxation. There is straightening of normal cervical lordosis which may be positional or due to muscle spasm. Grade 1 C3-C4 anterolisthesis. Skull base and vertebrae: No acute fracture.  Osteopenia. Soft tissues and spinal canal: No prevertebral fluid or swelling. No visible canal hematoma. Disc levels: Multilevel degenerative changes with disc space narrowing and endplate irregularity and spurring. Multilevel facet arthropathy. Upper chest: Biapical subpleural scarring. Other: Bilateral carotid bulb calcified plaques. IMPRESSION: 1. No acute intracranial pathology. 2. No acute/traumatic cervical spine pathology. 3. No acute facial bone fractures. Electronically Signed   By: Elgie Collard M.D.   On: 11/08/2020 21:32     EKG:  I have personally reviewed.  Sinus rhythm, QTC 462, poor R wave progression, nonspecific T wave change  Assessment/Plan Principal Problem:   Right rib fracture Active Problems:   Dementia, senile, without behavioral disturbance (HCC)   Fall at home, initial encounter   COPD (chronic obstructive pulmonary disease) (HCC)   Depression with anxiety   Elevated troponin   Acute respiratory failure with hypoxia (HCC)   HTN (hypertension)   Right rib fracture and fall: CT of chest showed a nondisplaced fracture of the right posterior 7th rib. No pneumothorax.  -Placed on MedSurg bed for patient -fall precaution -Incentive spirometry -Pain  control: As needed fentanyl, Tylenol, Norco -PT/OT -Consult transition care team -Patient may need home health and aid at discharge  Dementia, senile, without behavioral disturbance (HCC) -Namenda, Exelon  Acute respiratory failure with hypoxia and COPD (chronic obstructive pulmonary disease): Patient does not use oxygen at home.  Patient has normal oxygen saturation at at rest, but desaturates to 85% on ambulation.  Likely multifactorial etiology, including COPD and rib fracture.  Patient does not have wheezing or rhonchi on auscultation, does not seem to have COPD exacerbation. Hx of hx of CHF, BNP 146.3, no leg edema.  Does not seem to have new CHF. -Pain control for rib fracture as above -Incentive spirometry -Bronchodilators -Nasal cannula oxygen as needed  Depression with anxiety: -Cymbalta, Wellbutrin, Klonopin  Hypertension: Blood pressure 218/75, 179/77, likely due to pain. -IV hydralazine as needed -Amlodipine, labetalol  Elevated troponin: Troponin level 25 --> 25.  No chest pain.  Likely due to demand ischemia -Trend troponin -Aspirin 81 mg daily -Check A1c, FLP    DVT ppx:      SQ Lovenox Code Status: Full code Family Communication: I have tried to call her sister who did not pick up the phone, I left a message Disposition Plan:  Anticipate discharge back to previous environment Consults called:  nonne Admission status and Level of care: Med-Surg:    Med-surg bed for obs a   Status is: Observation  The patient remains OBS appropriate and will d/c before 2 midnights.  Dispo: The patient is from: Home              Anticipated d/c is to: Home              Patient currently is not medically stable to d/c.   Difficult to place patient No           Date of Service 11/09/2020    Lorretta Harp [facility]   If 7PM-7AM, please contact night-coverage [email or web address] 11/09/2020, 3:14 PM

## 2020-11-09 NOTE — ED Notes (Signed)
[facility] RN aware of assigned bed

## 2020-11-09 NOTE — ED Notes (Addendum)
PT provided an ice pack for her ribs & her skin tear on her right wrist was cleaned and redressed.

## 2020-11-09 NOTE — ED Notes (Signed)
Patient desat to 85-86% on room air while ambulating to the restroom. Dr. Elesa Massed notified and aware.

## 2020-11-09 NOTE — Evaluation (Addendum)
Occupational Therapy Evaluation Patient Details Name: Samantha Carpenter MRN: [id number] DOB: May 14, 1940 Today's Date: 11/09/2020    History of Present Illness 80 y.o. female with history of dementia, hypertension, COPD, chronic kidney disease who was initially here in the emergency department after she had a mechanical fall.  Imaging was unremarkable and patient was waiting for a ride home.  States she got up from her chair to go to the bathroom and it "sprung me forward" and she fell.  She is not sure if she hit her head but denies headache, neck pain, loss of consciousness. Pt with new R 7th rib fx.   Clinical Impression   Pt reports living at home alone and independently with ADLs and IADLs. Her husband recently passed away ~ 3 months ago and she has been alone since then. She does not endorse any other falls this year. She still drives and takes care of small dog and cat at home. She has a RW and SPC she uses on occasion. Pt demonstrates ambulation and self care tasks without use of AD at supervision level this session. Pt does not need skilled OT intervention at this time. OT recommends pt use RW at home for additional support. OT to SIGN OFF.    Follow Up Recommendations  No OT follow up    Equipment Recommendations  None recommended by OT;Other (comment) (Pt has all needed equipment)       Precautions / Restrictions Precautions Precautions: Fall      Mobility Bed Mobility Overal bed mobility: Modified Independent             General bed mobility comments: ED stretcher    Transfers Overall transfer level: Modified independent Equipment used: None                  Balance Overall balance assessment: History of Falls                                         ADL either performed or assessed with clinical judgement   ADL                                         General ADL Comments: Pt perfromed ambulation, functional mobility,  and self care tasks with supervision and no physical assist.     Vision Patient Visual Report: No change from baseline                  Pertinent Vitals/Pain Pain Assessment: 0-10 Pain Score: 5  Pain Location: R rib Pain Descriptors / Indicators: Aching;Discomfort Pain Intervention(s): Limited activity within patient's tolerance;Monitored during session;Repositioned     Hand Dominance Right   Extremity/Trunk Assessment Upper Extremity Assessment Upper Extremity Assessment: Overall WFL for tasks assessed;Generalized weakness   Lower Extremity Assessment Lower Extremity Assessment: Defer to PT evaluation       Communication Communication Communication: No difficulties   Cognition Arousal/Alertness: Awake/alert Behavior During Therapy: WFL for tasks assessed/performed Overall Cognitive Status: Within Functional Limits for tasks assessed                                 General Comments: slower to process information but answers all questions. A & O x4   General  Comments  no LOB this session but OT recommends use of RW for additional support at discharge            Home Living Family/patient expects to be discharged to:: Private residence Living Arrangements: Alone   Type of Home: Apartment Home Access: Level entry     Home Layout: One level     Bathroom Shower/Tub: Walk-in shower         Home Equipment: Environmental consultant - 2 wheels;Cane - single point;Shower seat          Prior Functioning/Environment Level of Independence: Independent        Comments: Pt reports living at home alone since husband passed 3 months ago. Pt is Ind in all aspects of ADLs and IADLs. She still drives and cares for her dog and cat at home. She has one sister nearby but she is unable to offer assistance.                 OT Goals(Current goals can be found in the care plan section) Acute Rehab OT Goals Patient Stated Goal: go home OT Goal Formulation: With  patient Time For Goal Achievement: 11/09/20 Potential to Achieve Goals: Good   AM-PAC OT "6 Clicks" Daily Activity     Outcome Measure Help from another person eating meals?: None Help from another person taking care of personal grooming?: None Help from another person toileting, which includes using toliet, bedpan, or urinal?: None Help from another person bathing (including washing, rinsing, drying)?: None Help from another person to put on and taking off regular upper body clothing?: None Help from another person to put on and taking off regular lower body clothing?: None 6 Click Score: 24   End of Session Nurse Communication: Mobility status  Activity Tolerance: Patient tolerated treatment well Patient left: in bed;with call bell/phone within reach  OT Visit Diagnosis: Unsteadiness on feet (R26.81);Repeated falls (R29.6);History of falling (Z91.81);Muscle weakness (generalized) (M62.81)                Time: 8756-4332 OT Time Calculation (min): 26 min Charges:  OT General Charges $OT Visit: 1 Visit OT Evaluation $OT Eval Low Complexity: 1 Low OT Treatments $Self Care/Home Management : 8-22 mins Jackquline Denmark, MS, OTR/L , CBIS ascom [phone]  11/09/20, 12:23 PM

## 2020-11-09 NOTE — ED Notes (Addendum)
Pt unable to obtain a cab at this time. RN attempted 2 times to contact patients sister Carney Bern) for a ride home without success. Pt agreed to remain in subwaiting until the AM for a cab ride to be arranged. Pt escorted via wheelchair to subwait and placed in a recliner with a blanket, pillow, patient belongings and callbell placed in hand.

## 2020-11-09 NOTE — ED Notes (Signed)
Pt found sitting on floor in subwait in front of recliner, call bell in seat; pt is tearful; st she was getting up to go to BR and "the chair threw her"; c/o pain to rt knee, rt breast and rt wrist; charge nurse notified; pt able to stand unassisted and sits in w/c; taken to [facility] for further evaluation

## 2020-11-09 NOTE — ED Notes (Addendum)
Pt escorted to the restroom via wheelchair and patient was ambulatory from the wheelchair to the commode and sink with this RN as a standby assist. VS obtained once the patient was placed back into bed.   MD notified.

## 2020-11-09 NOTE — ED Notes (Signed)
MD at the bedside  

## 2020-11-09 NOTE — Progress Notes (Signed)
Order Requisition for Advanced Directive. Met with patient to go over the education piece of the document. Patient did not seem to understand the document was only for medical decisions. After explaining this a few times she stated she did not want to do it. Maintained presence as she discussed other areas of concern. Maintained presence and support for the patient.

## 2020-11-09 NOTE — ED Notes (Signed)
Patient ambulate to the restroom with assistance to obtain urine sample. 

## 2020-11-09 NOTE — ED Provider Notes (Addendum)
[facility] Emergency Department Provider Note ____________________________________________   Event Date/Time   First MD Initiated Contact with Patient 11/09/20 [phone]     (approximate)  I have reviewed the triage vital signs and the nursing notes.   HISTORY  Chief Complaint Fall    HPI Samantha Carpenter is a 80 y.o. female with history of dementia, hypertension, COPD, chronic kidney disease who was initially here in the emergency department after she had a mechanical fall.  Imaging was unremarkable and patient was waiting for a ride home.  States she got up from her chair to go to the bathroom and it "sprung me forward" and she fell.  She is not sure if she hit her head but denies headache, neck pain, loss of consciousness.  Not on blood thinners.  Complaining of right rib pain, right wrist pain, right knee pain.  She denies any other symptoms that led to her fall.         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 Authorizing Provider   cephALEXin (KEFLEX) 500 MG capsule Take 2 capsules (1,000 mg total) by mouth 2 (two) times daily. 11/08/20  Yes Cuthriell, Delorise Royals, PA-C  acyclovir (ZOVIRAX) 800 MG tablet Take by mouth. Patient not taking: Reported on 11/20/2019 06/18/18   [provider]  amLODipine (NORVASC) 10 MG tablet Take 1 tablet (10 mg total) by mouth daily. 08/27/20 09/26/20  Sharman Cheek, MD  aspirin EC 81 MG tablet Take by mouth.    [provider]  buPROPion (WELLBUTRIN XL) 150 MG 24 hr tablet Take 150 mg by mouth daily. 10/27/19   [provider]  clonazePAM (KLONOPIN) 1 MG tablet Take by mouth 2 (two) times daily.  05/08/18   [provider]  DULoxetine (CYMBALTA) 60 MG capsule Take by mouth daily.  08/25/18   [provider]  fluticasone-salmeterol (ADVAIR HFA) 115-21 MCG/ACT inhaler Inhale 2 puffs into the lungs every 12 (twelve) hours. 01/07/19 01/07/20  [provider]  gabapentin (NEURONTIN) 100 MG capsule Take by mouth 2 (two) times daily.  06/18/18   [provider]  HYDROcodone-acetaminophen (NORCO/VICODIN) 5-325 MG tablet Take 1 tablet by mouth every 6 (six) hours as needed. 06/25/19   [provider]  irbesartan (AVAPRO) 300 MG tablet Take 300 mg by mouth daily.  04/28/18 11/20/19  [provider]  lidocaine (LIDODERM) 5 % Place 1 patch onto the skin daily. Remove & Discard patch within 12 hours or as directed by MD Patient not taking: Reported on 11/20/2019 05/11/19  Irean Hong, MD  memantine (NAMENDA) 10 MG tablet Take 10 mg by mouth 2 (two) times daily.  06/18/18   [provider]  OXcarbazepine (TRILEPTAL) 150 MG tablet 1 po qam qnd 2 po qhs 06/12/18   [provider]  traMADol (ULTRAM) 50 MG tablet Take 1 tablet (50 mg total) by mouth every 6 (six) hours as needed. Patient not taking: Reported on 11/20/2019 05/11/19   Irean Hong, MD    Allergies Morphine and related and Latex  Family History  Problem Relation  Age of Onset   Bipolar disorder Brother    Schizophrenia Brother    Bipolar disorder Sister    Schizophrenia Sister    Drug abuse Son     Social History Social History   Tobacco Use   Smoking status: Former    Types: Cigarettes    Quit date: 09/01/2009    Years since quitting: 11.1   Smokeless tobacco: Never  Vaping Use   Vaping Use: Never used  Substance Use Topics   Alcohol use: Not Currently   Drug use: Not Currently    Review of Systems Constitutional: No fever. Eyes: No visual changes. ENT: No sore throat. Cardiovascular: + chest pain. Respiratory: Denies shortness of breath. Gastrointestinal: No nausea, vomiting, diarrhea. Genitourinary: Negative for dysuria. Musculoskeletal: Negative for back pain. Skin: Negative for rash. Neurological: Negative for focal weakness or numbness.   ____________________________________________   PHYSICAL EXAM:  VITAL SIGNS: ED Triage Vitals  Enc Vitals Group     BP 11/08/20 2009 (!) 177/79     Pulse Rate 11/08/20 2009 76     Resp 11/08/20 2009 18     Temp 11/08/20 2009 98.5 F (36.9 C)     Temp Source 11/08/20 2009 Oral     SpO2 11/08/20 2009 97 %     Weight 11/08/20 2007 139 lb (63 kg)     Height 11/08/20 2007 5\' 2"  (1.575 m)     Head Circumference --      Peak Flow --      Pain Score 11/08/20 2007 7     Pain Loc --      Pain Edu? --      Excl. in GC? --    CONSTITUTIONAL: Alert and oriented and responds appropriately to questions. Well-appearing; well-nourished; GCS 15 HEAD: Normocephalic; abrasion to the right cheek which is old, otherwise head appears atraumatic EYES: Conjunctivae clear, PERRL, EOMI ENT: normal nose; no rhinorrhea; moist mucous membranes; pharynx without lesions noted; no dental injury; no septal hematoma NECK: Supple, no meningismus, no LAD; no midline spinal tenderness, step-off or deformity; trachea midline CARD: RRR; S1 and S2 appreciated; no murmurs, no clicks, no rubs, no gallops RESP:  Normal chest excursion without splinting or tachypnea; breath sounds clear and equal bilaterally; no wheezes, no rhonchi, no rales; no hypoxia or respiratory distress CHEST:  chest wall stable, no crepitus or ecchymosis or deformity, tender to palpation over the right chest wall ABD/GI: Normal bowel sounds; non-distended; soft, non-tender, no rebound, no guarding; no ecchymosis or other lesions noted PELVIS:  stable, nontender to palpation BACK:  The back appears normal and is non-tender to palpation, there is no CVA tenderness; no midline spinal tenderness, step-off or deformity EXT: Tender to palpation over the right wrist and right knee without deformity.  Compartments soft.  Otherwise extremities nontender to palpation.  Extremities warm well perfused.  No joint effusion. SKIN: Normal color for age and race; warm NEURO: Moves all extremities equally,  normal speech, no facial asymmetry PSYCH: The patient's mood and manner are appropriate. Grooming and personal hygiene are appropriate.  ____________________________________________   LABS (all labs ordered are listed, but only abnormal results are displayed)  Labs Reviewed  RESP PANEL BY RT-PCR (FLU A&B, COVID) [id number]  CBC WITH DIFFERENTIAL/PLATELET  BASIC METABOLIC PANEL  URINALYSIS, COMPLETE (UACMP) WITH MICROSCOPIC  TROPONIN I (HIGH SENSITIVITY)   ____________________________________________  EKG    EKG Interpretation  Date/Time:  Wednesday November 09 2020 05:32:02 EDT Ventricular Rate:  71 PR Interval:  194 QRS Duration: 80 QT Interval:  426 QTC Calculation: 462 R Axis:   64 Text Interpretation: Normal sinus rhythm Septal infarct , age undetermined Abnormal ECG Confirmed by Rochele Raring [phone]) on 11/09/2020 5:51:03 AM         ____________________________________________  RADIOLOGY Normajean Baxter Darius Fillingim, personally viewed and evaluated these images (plain radiographs) as part of my medical decision making, as well as  reviewing the written report by the radiologist.  ED MD interpretation: Imaging shows no acute traumatic injury.  Official radiology report(s): DG Wrist Complete Right  Result Date: 11/09/2020 CLINICAL DATA:  Fall and trauma to the right hand. EXAM: RIGHT WRIST - COMPLETE 3+ VIEW; RIGHT HAND - COMPLETE 3+ VIEW COMPARISON:  None. FINDINGS: There is no acute fracture or dislocation. The bones are osteopenic. There is arthritic changes of the distal interphalangeal joints. The soft tissues are unremarkable. IMPRESSION: No acute fracture or dislocation. Electronically Signed   By: Elgie Collard M.D.   On: 11/09/2020 03:17   CT HEAD WO CONTRAST ( )  Result Date: 11/09/2020 CLINICAL DATA:  80 year old female status post fall out of chair. EXAM: CT HEAD WITHOUT CONTRAST TECHNIQUE: Contiguous axial images were obtained from the base of the skull through the vertex without intravenous contrast. COMPARISON:  Brain MRI 09/01/2018.  Head CT 11/08/2020. FINDINGS: Brain: No midline shift, ventriculomegaly, mass effect, evidence of mass lesion, intracranial hemorrhage or evidence of cortically based acute infarction. Stable patchy and confluent bilateral cerebral white matter hypodensity since yesterday. Vascular: Calcified atherosclerosis at the skull base. No suspicious intracranial vascular hyperdensity. Skull: Stable comment intact. Sinuses/Orbits: Visualized paranasal sinuses and mastoids are stable and well aerated. Other: No orbit or scalp soft tissue injury identified. IMPRESSION: Stable. No acute intracranial abnormality or acute traumatic injury identified. Electronically Signed   By: Odessa Fleming M.D.   On: 11/09/2020 04:06   CT HEAD WO CONTRAST ( )  Result Date: 11/08/2020 CLINICAL DATA:  Facial trauma. EXAM: CT HEAD WITHOUT CONTRAST CT MAXILLOFACIAL WITHOUT CONTRAST CT CERVICAL SPINE WITHOUT CONTRAST TECHNIQUE: Multidetector CT imaging of the head, cervical spine, and maxillofacial structures were  performed using the standard protocol without intravenous contrast. Multiplanar CT image reconstructions of the cervical spine and maxillofacial structures were also generated. COMPARISON:  Brain MRI dated 09/01/2018. FINDINGS: CT HEAD FINDINGS Brain: Mild age-related atrophy and chronic microvascular ischemic changes. There is no acute intracranial hemorrhage. No mass effect or midline shift. No extra-axial fluid collection. Vascular: No hyperdense vessel or unexpected calcification. Skull: Normal. Negative for fracture or focal lesion. Other: None. CT MAXILLOFACIAL FINDINGS Osseous: No fracture or mandibular dislocation. No destructive process. Orbits: The globes and retro-orbital fat are preserved. Sinuses: Clear. Soft tissues: Mild right facial and periorbital contusion. CT CERVICAL SPINE FINDINGS Alignment: No acute subluxation. There is straightening of normal cervical lordosis which may be positional or due to muscle spasm. Grade 1 C3-C4 anterolisthesis. Skull base and vertebrae: No acute fracture.  Osteopenia. Soft tissues and spinal canal:  No prevertebral fluid or swelling. No visible canal hematoma. Disc levels: Multilevel degenerative changes with disc space narrowing and endplate irregularity and spurring. Multilevel facet arthropathy. Upper chest: Biapical subpleural scarring. Other: Bilateral carotid bulb calcified plaques. IMPRESSION: 1. No acute intracranial pathology. 2. No acute/traumatic cervical spine pathology. 3. No acute facial bone fractures. Electronically Signed   By: Elgie Collard M.D.   On: 11/08/2020 21:32   CT CHEST WO CONTRAST  Result Date: 11/09/2020 CLINICAL DATA:  80 year old female status post fall out of chair. Right breast pain, rib fracture suspected. EXAM: CT CHEST WITHOUT CONTRAST TECHNIQUE: Multidetector CT imaging of the chest was performed following the standard protocol without IV contrast. COMPARISON:  Chest radiographs 05/28/2019. FINDINGS: Cardiovascular:  Calcified aortic atherosclerosis. Calcified coronary artery atherosclerosis. No cardiomegaly or pericardial effusion. Mediastinum/Nodes: No mediastinal hematoma or lymphadenopathy is evident in the absence of IV contrast. Lungs/Pleura: Major airways are patent. Centrilobular upper lobe emphysema with confluent biapical scarring, partially calcified on the right. No pneumothorax. No pleural effusion. No pulmonary contusion. No lung nodule or acute pulmonary opacity identified. Upper Abdomen: Absent gallbladder. Negative visible noncontrast liver, spleen, pancreas, adrenal glands, kidneys, and bowel in the upper abdomen. Musculoskeletal: Subtle anterolisthesis of T11 on T12 is associated with chronic facet hypertrophy and degeneration there. Mild chronic appearing T7 and T9 superior endplate compression and/or Schmorl's nodes. Visible shoulder osseous structures appear intact. There is osteopenia of the ribs. No convincing anterior or lateral right rib fracture. However, there is a nondisplaced fracture of the right posterior 7th rib on series 4, image 62. No contralateral acute left rib fracture is identified. No superficial soft tissue injury identified. IMPRESSION: 1. Osteopenia of the ribs, with no convincing anterolateral right rib fracture, however, there is a nondisplaced fracture of the right posterior 7th rib. 2. No other acute traumatic injury identified in the noncontrast chest. 3. Aortic Atherosclerosis (ICD10-I70.0) and Emphysema (ICD10-J43.9). Electronically Signed   By: Odessa Fleming M.D.   On: 11/09/2020 04:19   CT Cervical Spine Wo Contrast  Result Date: 11/09/2020 CLINICAL DATA:  80 year old female status post fall out of chair. EXAM: CT CERVICAL SPINE WITHOUT CONTRAST TECHNIQUE: Multidetector CT imaging of the cervical spine was performed without intravenous contrast. Multiplanar CT image reconstructions were also generated. COMPARISON:  CT face and cervical spine 11/08/2020. FINDINGS: Alignment:  Stable. Mild straightening of cervical lordosis with mild anterolisthesis of C3 on C4. Subtle retrolisthesis of C4 on C5 and C5 on C6. Bilateral posterior element alignment is within normal limits. Skull base and vertebrae: Visualized skull base is intact. No atlanto-occipital dissociation. C1 and C2 appear intact and aligned. No acute osseous abnormality identified. Soft tissues and spinal canal: No prevertebral fluid or swelling. No visible canal hematoma. Dominant left vertebral artery. Bulky calcified carotid atherosclerosis in the bilateral neck. Disc levels: Stable cervical spine degeneration, including severe facet arthropathy on the right at C3-C4 with vacuum facet there. Upper chest: The T1 level appear stable and intact. Chest CT is reported separately. IMPRESSION: 1. No acute traumatic injury identified in the cervical spine. 2. Bulky calcified carotid atherosclerosis in the neck. Electronically Signed   By: Odessa Fleming M.D.   On: 11/09/2020 04:10   CT Cervical Spine Wo Contrast  Result Date: 11/08/2020 CLINICAL DATA:  Facial trauma. EXAM: CT HEAD WITHOUT CONTRAST CT MAXILLOFACIAL WITHOUT CONTRAST CT CERVICAL SPINE WITHOUT CONTRAST TECHNIQUE: Multidetector CT imaging of the head, cervical spine, and maxillofacial structures were performed using the standard protocol without intravenous contrast. Multiplanar CT image  reconstructions of the cervical spine and maxillofacial structures were also generated. COMPARISON:  Brain MRI dated 09/01/2018. FINDINGS: CT HEAD FINDINGS Brain: Mild age-related atrophy and chronic microvascular ischemic changes. There is no acute intracranial hemorrhage. No mass effect or midline shift. No extra-axial fluid collection. Vascular: No hyperdense vessel or unexpected calcification. Skull: Normal. Negative for fracture or focal lesion. Other: None. CT MAXILLOFACIAL FINDINGS Osseous: No fracture or mandibular dislocation. No destructive process. Orbits: The globes and  retro-orbital fat are preserved. Sinuses: Clear. Soft tissues: Mild right facial and periorbital contusion. CT CERVICAL SPINE FINDINGS Alignment: No acute subluxation. There is straightening of normal cervical lordosis which may be positional or due to muscle spasm. Grade 1 C3-C4 anterolisthesis. Skull base and vertebrae: No acute fracture.  Osteopenia. Soft tissues and spinal canal: No prevertebral fluid or swelling. No visible canal hematoma. Disc levels: Multilevel degenerative changes with disc space narrowing and endplate irregularity and spurring. Multilevel facet arthropathy. Upper chest: Biapical subpleural scarring. Other: Bilateral carotid bulb calcified plaques. IMPRESSION: 1. No acute intracranial pathology. 2. No acute/traumatic cervical spine pathology. 3. No acute facial bone fractures. Electronically Signed   By: Elgie Collard M.D.   On: 11/08/2020 21:32   DG Knee Complete 4 Views Right  Result Date: 11/09/2020 CLINICAL DATA:  Fall, right knee pain EXAM: RIGHT KNEE - COMPLETE 4+ VIEW COMPARISON:  None. 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 And ossific density is seen within the intercondylar notch in keeping with and intra-articular loose body. No acute fracture or dislocation. No effusion. Soft tissues are unremarkable. IMPRESSION: No acute fracture or dislocation. 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 Electronically Signed   By: Helyn Numbers M.D.   On: 11/09/2020 03:18   DG Hand Complete Right  Result Date: 11/09/2020 CLINICAL DATA:  Fall and trauma to the right hand. EXAM: RIGHT WRIST - COMPLETE 3+ VIEW; RIGHT HAND - COMPLETE 3+ VIEW COMPARISON:  None. FINDINGS: There is no acute fracture or dislocation. The bones are osteopenic. There is arthritic changes of the distal interphalangeal joints. The soft tissues are unremarkable.  IMPRESSION: No acute fracture or dislocation. Electronically Signed   By: Elgie Collard M.D.   On: 11/09/2020 03:17   CT Maxillofacial Wo Contrast  Result Date: 11/08/2020 CLINICAL DATA:  Facial trauma. EXAM: CT HEAD WITHOUT CONTRAST CT MAXILLOFACIAL WITHOUT CONTRAST CT CERVICAL SPINE WITHOUT CONTRAST TECHNIQUE: Multidetector CT imaging of the head, cervical spine, and maxillofacial structures were performed using the standard protocol without intravenous contrast. Multiplanar CT image reconstructions of the cervical spine and maxillofacial structures were also generated. COMPARISON:  Brain MRI dated 09/01/2018. FINDINGS: CT HEAD FINDINGS Brain: Mild age-related atrophy and chronic microvascular ischemic changes. There is no acute intracranial hemorrhage. No mass effect or midline shift. No extra-axial fluid collection. Vascular: No hyperdense vessel or unexpected calcification. Skull: Normal. Negative for fracture or focal lesion. Other: None. CT MAXILLOFACIAL FINDINGS Osseous: No fracture or mandibular dislocation. No destructive process. Orbits: The globes and retro-orbital fat are preserved. Sinuses: Clear. Soft tissues: Mild right facial and periorbital contusion. CT CERVICAL SPINE FINDINGS Alignment: No acute subluxation. There is straightening of normal cervical lordosis which may be positional or due to muscle spasm. Grade 1 C3-C4 anterolisthesis. Skull base and vertebrae: No acute fracture.  Osteopenia. Soft tissues and spinal canal: No prevertebral fluid or swelling. No visible canal hematoma. Disc levels: Multilevel degenerative changes with disc space narrowing  and endplate irregularity and spurring. Multilevel facet arthropathy. Upper chest: Biapical subpleural scarring. Other: Bilateral carotid bulb calcified plaques. IMPRESSION: 1. No acute intracranial pathology. 2. No acute/traumatic cervical spine pathology. 3. No acute facial bone fractures. Electronically Signed   By: Elgie CollardArash  Radparvar M.D.    On: 11/08/2020 21:32    ____________________________________________   PROCEDURES  Procedure(s) performed (including Critical Care):  Procedures  CRITICAL CARE Performed by: Baxter HireKristen Cayley Pester   Total critical care time: 45 minutes  Critical care time was exclusive of separately billable procedures and treating other patients.  Critical care was necessary to treat or prevent imminent or life-threatening deterioration.  Critical care was time spent personally by me on the following activities: development of treatment plan with patient and/or surrogate as well as nursing, discussions with consultants, evaluation of patient's response to treatment, examination of patient, obtaining history from patient or surrogate, ordering and performing treatments and interventions, ordering and review of laboratory studies, ordering and review of radiographic studies, pulse oximetry and re-evaluation of patient's condition.  ____________________________________________   INITIAL IMPRESSION / ASSESSMENT AND PLAN / ED COURSE  As part of my medical decision making, I reviewed the following data within the electronic MEDICAL RECORD NUMBER Nursing notes reviewed and incorporated, Labs reviewed , EKG interpreted , Old EKG reviewed, Radiograph reviewed , Discussed with admitting physician , CT reviewed, and Notes from prior ED visits         Patient here with fall out of chair.  No preceding symptoms the patient is not a very reliable historian and is demented.  Also extremely hypertensive which could be from pain.  She is unsure if she hit her head.  We will repeat CT imaging of her head and neck.  She has an abrasion to her face but this is old per nurse that was taking care of her earlier.  We will also obtain x-rays of the wrist and knee on the right side.  Will give Tylenol for pain.  Will obtain labs, EKG, urine to ensure no anemia, electrolyte derangement, ACS, UTI, dehydration that caused her to fall for  the second time today.  ED PROGRESS  5:22 AM  Repeat CT head and cervical spine show no acute traumatic injury.  CT of her chest does show that she has a posterior right seventh rib fracture with no pneumothorax.  Sats 94% currently on room air at rest but with ambulation she quickly dropped to 85% on room air.  Does not wear oxygen chronically.  Patient will need admission.  Labs, EKG, urine pending.  Blood pressure improved slightly after Tylenol but still elevated.  X-rays of the right wrist and knee show no acute traumatic injury.  7:30 AM  Pt's labs show minimally elevated troponin but otherwise unremarkable.  COVID and flu negative.  Will discuss with hospitalist for admission for rib fracture with hypoxia with ambulation.   7:58 AM Discussed patient's case with hospitalist, Dr. Clyde LundborgNiu.  I have recommended admission and patient (and family if present) agree with this plan. Admitting physician will place admission orders.   Attempted to get in touch with patient's sister Samantha Carpenter ([phone]) by phone without answer or way to leave message.  No family present during the time that I have been caring for patient.  I reviewed all nursing notes, vitals, pertinent previous records and reviewed/interpreted all EKGs, lab and urine results, imaging (as available).  ____________________________________________   FINAL CLINICAL IMPRESSION(S) / ED DIAGNOSES  Final diagnoses:  Abrasion of cheek,  initial encounter  Abrasions of multiple sites  Multiple falls  Closed fracture of one rib of right side, initial encounter  Hypoxia     ED Discharge Orders          Ordered    cephALEXin (KEFLEX) 500 MG capsule  2 times daily        11/08/20 2244            *Please note:  Samantha Carpenter was evaluated in Emergency Department on 11/09/2020 for the symptoms described in the history of present illness. She was evaluated in the context of the global COVID-19 pandemic, which necessitated  consideration that the patient might be at risk for infection with the SARS-CoV-2 virus that causes COVID-19. Institutional protocols and algorithms that pertain to the evaluation of patients at risk for COVID-19 are in a state of rapid change based on information released by regulatory bodies including the CDC and federal and state organizations. These policies and algorithms were followed during the patient's care in the ED.  Some ED evaluations and interventions may be delayed as a result of limited staffing during and the pandemic.*   Note:  This document was prepared using Dragon voice recognition software and may include unintentional dictation errors.    Benay Pomeroy, Layla Maw, DO 11/09/20 0758    Navjot Pilgrim, Layla Maw, DO 11/09/20 [phone]

## 2020-11-09 NOTE — ED Notes (Signed)
Patient helped to restroom with steady gait. Denies any shortness of breath or severe pain at this time. Placed back in stretcher.

## 2020-11-10 ENCOUNTER — Encounter: Payer: Self-pay | Admitting: Internal Medicine

## 2020-11-10 DIAGNOSIS — F0390 Unspecified dementia without behavioral disturbance: Secondary | ICD-10-CM | POA: Diagnosis not present

## 2020-11-10 DIAGNOSIS — I10 Essential (primary) hypertension: Secondary | ICD-10-CM | POA: Diagnosis not present

## 2020-11-10 DIAGNOSIS — S2231XA Fracture of one rib, right side, initial encounter for closed fracture: Secondary | ICD-10-CM | POA: Diagnosis not present

## 2020-11-10 LAB — LIPID PANEL
Cholesterol: 180 mg/dL (ref 0–200)
HDL: 78 mg/dL (ref >40)
LDL Cholesterol: 85 mg/dL (ref 0–99)
Total CHOL/HDL Ratio: 2.3 RATIO
Triglycerides: 83 mg/dL (ref <150)
VLDL: 17 mg/dL (ref 0–40)

## 2020-11-10 NOTE — Progress Notes (Signed)
Physical Therapy Treatment Patient Details Name: Samantha Carpenter MRN: [id number] DOB: 08-27-1940 Today's Date: 11/10/2020    History of Present Illness 80 y.o. female with history of dementia, hypertension, COPD, chronic kidney disease who was initially here in the emergency department after she had a mechanical fall.  Imaging was unremarkable and patient was waiting for a ride home.  States she got up from her chair to go to the bathroom and it "sprung me forward" and she fell.  She is not sure if she hit her head but denies headache, neck pain, loss of consciousness. Pt with new R 7th rib fx.    PT Comments    Pt was long sitting in bed upon arriving. Agrees to PT session and is cooperative. Pt is only oriented to self. She lives alone and clearly has cognition deficits. She reports she has a son who is incarcerated and a sister who is ill. She ambulated in hallway without AD however has several occasions of LOB. Author highly recommends use of RW at all times for safety. Will need constant reinforcements due to short term memory concerns. Pt will benefit form continued skilled PT to improve safety with ADLs. Would greatly benefit form 24/7 supervision at home.    Follow Up Recommendations  Supervision/Assistance - 24 hour;Home health PT;Supervision for mobility/OOB;Other (comment) (pt's cognition makes her high fall risk due to cognition deficits. Lives alone but would greatly benifit from 24 hour supervision due to safety concerns.)     Equipment Recommendations  Rolling walker with 5" wheels       Precautions / Restrictions Precautions Precautions: Fall Restrictions Weight Bearing Restrictions: No    Mobility  Bed Mobility Overal bed mobility: Modified Independent      General bed mobility comments: Easily able to exit bed without assistance however pt demonstrates some impulsivity    Transfers Overall transfer level: Needs assistance Equipment used: None Transfers: Sit  to/from Stand Sit to Stand: Min guard         General transfer comment: Pt requires CGA for safety  Ambulation/Gait Ambulation/Gait assistance: Min guard Gait Distance (Feet): 200 Feet Assistive device: None (Highly recommended use of RW at all times going forward) Gait Pattern/deviations: Step-through pattern Gait velocity: WNL   General Gait Details: does have some unsteadiness with ambulation without AD. recommend use of RW at all times going forward     Balance Overall balance assessment: Needs assistance Sitting-balance support: Feet unsupported;No upper extremity supported Sitting balance-Leahy Scale: Good     Standing balance support: No upper extremity supported;During functional activity Standing balance-Leahy Scale: Poor Standing balance comment: high fakll risk without UE support. highly recommend use of AD at all times        Cognition Arousal/Alertness: Awake/alert Behavior During Therapy: WFL for tasks assessed/performed Overall Cognitive Status: Impaired/Different from baseline Area of Impairment: Orientation;Attention;Following commands;Safety/judgement;Awareness;Problem solving;Memory      Orientation Level: Disoriented to;Place;Time;Situation Current Attention Level: Focused Memory: Decreased recall of precautions;Decreased short-term memory Following Commands: Follows one step commands consistently Safety/Judgement: Decreased awareness of safety;Decreased awareness of deficits Awareness: Intellectual Problem Solving: Slow processing;Decreased initiation;Difficulty sequencing;Requires verbal cues General Comments: Pt is alert and agreeablehowever has cognition deficits. lives alone and no assistance at home. son is in prison and sister is ill             Pertinent Vitals/Pain Pain Assessment: 0-10 Pain Score: 2  Faces Pain Scale: Hurts a little bit Pain Location: R rib Pain Descriptors / Indicators: Aching;Discomfort Pain Intervention(s):  Limited  activity within patient's tolerance;Monitored during session;Premedicated before session;Repositioned     PT Goals (current goals can now be found in the care plan section) Acute Rehab PT Goals Patient Stated Goal: go home Progress towards PT goals: Progressing toward goals    Frequency    7X/week      PT Plan Current plan remains appropriate       AM-PAC PT "6 Clicks" Mobility   Outcome Measure  Help needed turning from your back to your side while in a flat bed without using bedrails?: None Help needed moving from lying on your back to sitting on the side of a flat bed without using bedrails?: A Little Help needed moving to and from a bed to a chair (including a wheelchair)?: A Little Help needed standing up from a chair using your arms (e.g., wheelchair or bedside chair)?: A Little Help needed to walk in hospital room?: A Little Help needed climbing 3-5 steps with a railing? : A Little 6 Click Score: 19    End of Session Equipment Utilized During Treatment: Gait belt Activity Tolerance: Patient tolerated treatment well Patient left: in bed;with call bell/phone within reach;with bed alarm set Nurse Communication: Mobility status PT Visit Diagnosis: Unsteadiness on feet (R26.81);Repeated falls (R29.6);Pain Pain - part of body:  (R side rib pain)     Time: 1610-9604 PT Time Calculation (min) (ACUTE ONLY): 18 min  Charges:  $Gait Training: 8-22 mins                     Jetta Lout PTA 11/10/20, 10:35 AM

## 2020-11-10 NOTE — Progress Notes (Signed)
Patient removed IV on dayshift. Patient is refusing to have it replaced.

## 2020-11-10 NOTE — Progress Notes (Signed)
Met with the patient to discuss DC plan and needs She lives at home alone and still drives herself, she has a small dog and cat that she cares for, She has a rolling walker, a rolator and a shower seat and cane at home and does not need additional DME, she is open with [facility] for nursing and SW, will add PT Spoke with Judson Roch at [facility] and they agree to add PT and continue with Nursing and SW, no additional needs at this time

## 2020-11-10 NOTE — Progress Notes (Signed)
PROGRESS NOTE    Samantha LatinoBarbara Carpenter  EXB:[id number] DOB: 04-27-1940 DOA: 11/09/2020 PCP: Samantha ArbourSparks, Jeffrey D, MD   Assessment & Plan:   Principal Problem:   Right rib fracture Active Problems:   Dementia, senile, without behavioral disturbance (HCC)   Fall at home, initial encounter   COPD (chronic obstructive pulmonary disease) (HCC)   Depression with anxiety   Elevated troponin   Acute respiratory failure with hypoxia (HCC)   HTN (hypertension)  Right rib fracture: CT chest showed a nondisplaced fracture of the right posterior 7th rib. No pneumothorax. Secondary to fall at home. Encourage incentive spirometry. Norco, tylenol prn pain. PT/OT consulted    Dementia: continue on home dose of namenda, exelon. Lives alone.    Acute hypoxic respiratory failure: likely secondary to COPD. Does not use oxygen at home.  Patient has normal oxygen saturation at at rest, but desaturates to 85% on ambulation.  Likely multifactorial etiology, including COPD and rib fracture. Encourage incentive spirometry. Continue on bronchodilators. Continue on supplemental oxygen and wean as tolerated  Depression: severity unknown. Continue on home dose of duloxetine, wellbutrin  Anxiety: severity unknown. Continue on home dose klonopin    HTN: continue on amlodipine, bisoprolol   Elevated troponin: no chest pain. Likely secondary to demand ischemia   DVT prophylaxis: lovenox  Code Status: full  Family Communication:  Disposition Plan: unclear   Level of care: Med-Surg  Status is: Observation  The patient remains OBS appropriate and will Carpenter/c before 2 midnights.  Dispo: The patient is from: Home              Anticipated Carpenter/c is to:  unclear              Patient currently is not medically stable to Carpenter/c.   Difficult to place patient Yes     Consultants:    Procedures:   Antimicrobials:   Subjective: Pt c/o rib cage pain  Objective: Vitals:   11/09/20 2008 11/10/20 0531 11/10/20 0828  11/10/20 1158  BP:  (!) 133/53 (!) 150/61 (!) 129/56  Pulse:  66 64 (!) 54  Resp:  18 15 17   Temp:  (!) 97.5 F (36.4 C) 98.6 F (37 C) 98.6 F (37 C)  TempSrc:      SpO2: 94% 92% 93% 92%  Weight:      Height:        Intake/Output Summary (Last 24 hours) at 11/10/2020 1356 Last data filed at 11/10/2020 1026 Gross per 24 hour  Intake 240 ml  Output --  Net 240 ml   Filed Weights   11/08/20 2007  Weight: 63 kg    Examination:  General exam: Appears calm and comfortable  Respiratory system: diminished breath sounds b/l otherwise clear  Cardiovascular system: S1 & S2+. No rubs, gallops or clicks.  Gastrointestinal system: Abdomen is nondistended, soft and nontender. Normal bowel sounds heard. Central nervous system: Alert and awake. Moves all extremities Psychiatry: Judgement and insight appear abnormal. Mood & affect appropriate.     Data Reviewed: I have personally reviewed following labs and imaging studies  CBC: Recent Labs  Lab 11/09/20 0600  WBC 6.2  NEUTROABS 3.6  HGB 12.7  HCT 37.9  MCV 97.4  PLT 277   Basic Metabolic Panel: Recent Labs  Lab 11/09/20 0600  NA 139  K 3.8  CL 99  CO2 31  GLUCOSE 106*  BUN 18  CREATININE 0.73  CALCIUM 9.3   GFR: Estimated Creatinine Clearance: 48.1 mL/min (by C-G formula  based on SCr of 0.73 mg/dL). Liver Function Tests: No results for input(s): AST, ALT, ALKPHOS, BILITOT, PROT, ALBUMIN in the last 168 hours. No results for input(s): LIPASE, AMYLASE in the last 168 hours. No results for input(s): AMMONIA in the last 168 hours. Coagulation Profile: Recent Labs  Lab 11/09/20 1229  INR 0.9   Cardiac Enzymes: No results for input(s): CKTOTAL, CKMB, CKMBINDEX, TROPONINI in the last 168 hours. BNP (last 3 results) No results for input(s): PROBNP in the last 8760 hours. HbA1C: Recent Labs    11/09/20 1229  HGBA1C 6.1*   CBG: No results for input(s): GLUCAP in the last 168 hours. Lipid Profile: Recent  Labs    11/10/20 0506  CHOL 180  HDL 78  LDLCALC 85  TRIG 83  CHOLHDL 2.3   Thyroid Function Tests: No results for input(s): TSH, T4TOTAL, FREET4, T3FREE, THYROIDAB in the last 72 hours. Anemia Panel: No results for input(s): VITAMINB12, FOLATE, FERRITIN, TIBC, IRON, RETICCTPCT in the last 72 hours. Sepsis Labs: No results for input(s): PROCALCITON, LATICACIDVEN in the last 168 hours.  Recent Results (from the past 240 hour(s))  Resp Panel by RT-PCR (Flu A&B, Covid) Nasopharyngeal Swab     Status: None   Collection Time: 11/09/20  6:00 AM   Specimen: Nasopharyngeal Swab; Nasopharyngeal(NP) swabs in vial transport medium  Result Value Ref Range Status   SARS Coronavirus 2 by RT PCR NEGATIVE NEGATIVE Final    Comment: (NOTE) SARS-CoV-2 target nucleic acids are NOT DETECTED.  The SARS-CoV-2 RNA is generally detectable in upper respiratory specimens during the acute phase of infection. The lowest concentration of SARS-CoV-2 viral copies this assay can detect is 138 copies/mL. A negative result does not preclude SARS-Cov-2 infection and should not be used as the sole basis for treatment or other patient management decisions. A negative result may occur with  improper specimen collection/handling, submission of specimen other than nasopharyngeal swab, presence of viral mutation(s) within the areas targeted by this assay, and inadequate number of viral copies(<138 copies/mL). A negative result must be combined with clinical observations, patient history, and epidemiological information. The expected result is Negative.  Fact Sheet for Patients:  BloggerCourse.com  Fact Sheet for Healthcare Providers:  SeriousBroker.it  This test is no t yet approved or cleared by the [place] FDA and  has been authorized for detection and/or diagnosis of SARS-CoV-2 by FDA under an Emergency Use Authorization (EUA). This EUA will remain  in  effect (meaning this test can be used) for the duration of the COVID-19 declaration under Section 564(b)(1) of the Act, 21 U.S.C.section 360bbb-3(b)(1), unless the authorization is terminated  or revoked sooner.       Influenza A by PCR NEGATIVE NEGATIVE Final   Influenza B by PCR NEGATIVE NEGATIVE Final    Comment: (NOTE) The Xpert Xpress SARS-CoV-2/FLU/RSV plus assay is intended as an aid in the diagnosis of influenza from Nasopharyngeal swab specimens and should not be used as a sole basis for treatment. Nasal washings and aspirates are unacceptable for Xpert Xpress SARS-CoV-2/FLU/RSV testing.  Fact Sheet for Patients: BloggerCourse.com  Fact Sheet for Healthcare Providers: SeriousBroker.it  This test is not yet approved or cleared by the [place] FDA and has been authorized for detection and/or diagnosis of SARS-CoV-2 by FDA under an Emergency Use Authorization (EUA). This EUA will remain in effect (meaning this test can be used) for the duration of the COVID-19 declaration under Section 564(b)(1) of the Act, 21 U.S.C. section 360bbb-3(b)(1), unless the authorization  is terminated or revoked.  Performed at [facility], [address] [place], [place] [address]          Radiology Studies: DG Wrist Complete Right  Result Date: 11/09/2020 CLINICAL DATA:  Fall and trauma to the right hand. EXAM: RIGHT WRIST - COMPLETE 3+ VIEW; RIGHT HAND - COMPLETE 3+ VIEW COMPARISON:  None. FINDINGS: There is no acute fracture or dislocation. The bones are osteopenic. There is arthritic changes of the distal interphalangeal joints. The soft tissues are unremarkable. IMPRESSION: No acute fracture or dislocation. Electronically Signed   By: Elgie Collard M.Carpenter.   On: 11/09/2020 03:17   CT HEAD WO CONTRAST ( )  Result Date: 11/09/2020 CLINICAL DATA:  80 year old female status post fall out of chair. EXAM: CT HEAD WITHOUT  CONTRAST TECHNIQUE: Contiguous axial images were obtained from the base of the skull through the vertex without intravenous contrast. COMPARISON:  Brain MRI 09/01/2018.  Head CT 11/08/2020. FINDINGS: Brain: No midline shift, ventriculomegaly, mass effect, evidence of mass lesion, intracranial hemorrhage or evidence of cortically based acute infarction. Stable patchy and confluent bilateral cerebral white matter hypodensity since yesterday. Vascular: Calcified atherosclerosis at the skull base. No suspicious intracranial vascular hyperdensity. Skull: Stable comment intact. Sinuses/Orbits: Visualized paranasal sinuses and mastoids are stable and well aerated. Other: No orbit or scalp soft tissue injury identified. IMPRESSION: Stable. No acute intracranial abnormality or acute traumatic injury identified. Electronically Signed   By: Odessa Fleming M.Carpenter.   On: 11/09/2020 04:06   CT HEAD WO CONTRAST ( )  Result Date: 11/08/2020 CLINICAL DATA:  Facial trauma. EXAM: CT HEAD WITHOUT CONTRAST CT MAXILLOFACIAL WITHOUT CONTRAST CT CERVICAL SPINE WITHOUT CONTRAST TECHNIQUE: Multidetector CT imaging of the head, cervical spine, and maxillofacial structures were performed using the standard protocol without intravenous contrast. Multiplanar CT image reconstructions of the cervical spine and maxillofacial structures were also generated. COMPARISON:  Brain MRI dated 09/01/2018. FINDINGS: CT HEAD FINDINGS Brain: Mild age-related atrophy and chronic microvascular ischemic changes. There is no acute intracranial hemorrhage. No mass effect or midline shift. No extra-axial fluid collection. Vascular: No hyperdense vessel or unexpected calcification. Skull: Normal. Negative for fracture or focal lesion. Other: None. CT MAXILLOFACIAL FINDINGS Osseous: No fracture or mandibular dislocation. No destructive process. Orbits: The globes and retro-orbital fat are preserved. Sinuses: Clear. Soft tissues: Mild right facial and periorbital  contusion. CT CERVICAL SPINE FINDINGS Alignment: No acute subluxation. There is straightening of normal cervical lordosis which may be positional or due to muscle spasm. Grade 1 C3-C4 anterolisthesis. Skull base and vertebrae: No acute fracture.  Osteopenia. Soft tissues and spinal canal: No prevertebral fluid or swelling. No visible canal hematoma. Disc levels: Multilevel degenerative changes with disc space narrowing and endplate irregularity and spurring. Multilevel facet arthropathy. Upper chest: Biapical subpleural scarring. Other: Bilateral carotid bulb calcified plaques. IMPRESSION: 1. No acute intracranial pathology. 2. No acute/traumatic cervical spine pathology. 3. No acute facial bone fractures. Electronically Signed   By: Elgie Collard M.Carpenter.   On: 11/08/2020 21:32   CT CHEST WO CONTRAST  Result Date: 11/09/2020 CLINICAL DATA:  80 year old female status post fall out of chair. Right breast pain, rib fracture suspected. EXAM: CT CHEST WITHOUT CONTRAST TECHNIQUE: Multidetector CT imaging of the chest was performed following the standard protocol without IV contrast. COMPARISON:  Chest radiographs 05/28/2019. FINDINGS: Cardiovascular: Calcified aortic atherosclerosis. Calcified coronary artery atherosclerosis. No cardiomegaly or pericardial effusion. Mediastinum/Nodes: No mediastinal hematoma or lymphadenopathy is evident in the absence of IV contrast. Lungs/Pleura: Major airways are  patent. Centrilobular upper lobe emphysema with confluent biapical scarring, partially calcified on the right. No pneumothorax. No pleural effusion. No pulmonary contusion. No lung nodule or acute pulmonary opacity identified. Upper Abdomen: Absent gallbladder. Negative visible noncontrast liver, spleen, pancreas, adrenal glands, kidneys, and bowel in the upper abdomen. Musculoskeletal: Subtle anterolisthesis of T11 on T12 is associated with chronic facet hypertrophy and degeneration there. Mild chronic appearing T7 and T9  superior endplate compression and/or Schmorl's nodes. Visible shoulder osseous structures appear intact. There is osteopenia of the ribs. No convincing anterior or lateral right rib fracture. However, there is a nondisplaced fracture of the right posterior 7th rib on series 4, image 62. No contralateral acute left rib fracture is identified. No superficial soft tissue injury identified. IMPRESSION: 1. Osteopenia of the ribs, with no convincing anterolateral right rib fracture, however, there is a nondisplaced fracture of the right posterior 7th rib. 2. No other acute traumatic injury identified in the noncontrast chest. 3. Aortic Atherosclerosis (ICD10-I70.0) and Emphysema (ICD10-J43.9). Electronically Signed   By: Odessa Fleming M.Carpenter.   On: 11/09/2020 04:19   CT Cervical Spine Wo Contrast  Result Date: 11/09/2020 CLINICAL DATA:  81 year old female status post fall out of chair. EXAM: CT CERVICAL SPINE WITHOUT CONTRAST TECHNIQUE: Multidetector CT imaging of the cervical spine was performed without intravenous contrast. Multiplanar CT image reconstructions were also generated. COMPARISON:  CT face and cervical spine 11/08/2020. FINDINGS: Alignment: Stable. Mild straightening of cervical lordosis with mild anterolisthesis of C3 on C4. Subtle retrolisthesis of C4 on C5 and C5 on C6. Bilateral posterior element alignment is within normal limits. Skull base and vertebrae: Visualized skull base is intact. No atlanto-occipital dissociation. C1 and C2 appear intact and aligned. No acute osseous abnormality identified. Soft tissues and spinal canal: No prevertebral fluid or swelling. No visible canal hematoma. Dominant left vertebral artery. Bulky calcified carotid atherosclerosis in the bilateral neck. Disc levels: Stable cervical spine degeneration, including severe facet arthropathy on the right at C3-C4 with vacuum facet there. Upper chest: The T1 level appear stable and intact. Chest CT is reported separately. IMPRESSION:  1. No acute traumatic injury identified in the cervical spine. 2. Bulky calcified carotid atherosclerosis in the neck. Electronically Signed   By: Odessa Fleming M.Carpenter.   On: 11/09/2020 04:10   CT Cervical Spine Wo Contrast  Result Date: 11/08/2020 CLINICAL DATA:  Facial trauma. EXAM: CT HEAD WITHOUT CONTRAST CT MAXILLOFACIAL WITHOUT CONTRAST CT CERVICAL SPINE WITHOUT CONTRAST TECHNIQUE: Multidetector CT imaging of the head, cervical spine, and maxillofacial structures were performed using the standard protocol without intravenous contrast. Multiplanar CT image reconstructions of the cervical spine and maxillofacial structures were also generated. COMPARISON:  Brain MRI dated 09/01/2018. FINDINGS: CT HEAD FINDINGS Brain: Mild age-related atrophy and chronic microvascular ischemic changes. There is no acute intracranial hemorrhage. No mass effect or midline shift. No extra-axial fluid collection. Vascular: No hyperdense vessel or unexpected calcification. Skull: Normal. Negative for fracture or focal lesion. Other: None. CT MAXILLOFACIAL FINDINGS Osseous: No fracture or mandibular dislocation. No destructive process. Orbits: The globes and retro-orbital fat are preserved. Sinuses: Clear. Soft tissues: Mild right facial and periorbital contusion. CT CERVICAL SPINE FINDINGS Alignment: No acute subluxation. There is straightening of normal cervical lordosis which may be positional or due to muscle spasm. Grade 1 C3-C4 anterolisthesis. Skull base and vertebrae: No acute fracture.  Osteopenia. Soft tissues and spinal canal: No prevertebral fluid or swelling. No visible canal hematoma. Disc levels: Multilevel degenerative changes with disc space narrowing and  endplate irregularity and spurring. Multilevel facet arthropathy. Upper chest: Biapical subpleural scarring. Other: Bilateral carotid bulb calcified plaques. IMPRESSION: 1. No acute intracranial pathology. 2. No acute/traumatic cervical spine pathology. 3. No acute facial  bone fractures. Electronically Signed   By: Elgie Collard M.Carpenter.   On: 11/08/2020 21:32   DG Knee Complete 4 Views Right  Result Date: 11/09/2020 CLINICAL DATA:  Fall, right knee pain EXAM: RIGHT KNEE - COMPLETE 4+ VIEW COMPARISON:  None. 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 And ossific density is seen within the intercondylar notch in keeping with and intra-articular loose body. No acute fracture or dislocation. No effusion. Soft tissues are unremarkable. IMPRESSION: No acute fracture or dislocation. 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 Electronically Signed   By: Helyn Numbers M.Carpenter.   On: 11/09/2020 03:18   DG Hand Complete Right  Result Date: 11/09/2020 CLINICAL DATA:  Fall and trauma to the right hand. EXAM: RIGHT WRIST - COMPLETE 3+ VIEW; RIGHT HAND - COMPLETE 3+ VIEW COMPARISON:  None. FINDINGS: There is no acute fracture or dislocation. The bones are osteopenic. There is arthritic changes of the distal interphalangeal joints. The soft tissues are unremarkable. IMPRESSION: No acute fracture or dislocation. Electronically Signed   By: Elgie Collard M.Carpenter.   On: 11/09/2020 03:17   CT Maxillofacial Wo Contrast  Result Date: 11/08/2020 CLINICAL DATA:  Facial trauma. EXAM: CT HEAD WITHOUT CONTRAST CT MAXILLOFACIAL WITHOUT CONTRAST CT CERVICAL SPINE WITHOUT CONTRAST TECHNIQUE: Multidetector CT imaging of the head, cervical spine, and maxillofacial structures were performed using the standard protocol without intravenous contrast. Multiplanar CT image reconstructions of the cervical spine and maxillofacial structures were also generated. COMPARISON:  Brain MRI dated 09/01/2018. FINDINGS: CT HEAD FINDINGS Brain: Mild age-related atrophy and chronic microvascular ischemic changes. There is no acute intracranial hemorrhage. No mass effect or midline  shift. No extra-axial fluid collection. Vascular: No hyperdense vessel or unexpected calcification. Skull: Normal. Negative for fracture or focal lesion. Other: None. CT MAXILLOFACIAL FINDINGS Osseous: No fracture or mandibular dislocation. No destructive process. Orbits: The globes and retro-orbital fat are preserved. Sinuses: Clear. Soft tissues: Mild right facial and periorbital contusion. CT CERVICAL SPINE FINDINGS Alignment: No acute subluxation. There is straightening of normal cervical lordosis which may be positional or due to muscle spasm. Grade 1 C3-C4 anterolisthesis. Skull base and vertebrae: No acute fracture.  Osteopenia. Soft tissues and spinal canal: No prevertebral fluid or swelling. No visible canal hematoma. Disc levels: Multilevel degenerative changes with disc space narrowing and endplate irregularity and spurring. Multilevel facet arthropathy. Upper chest: Biapical subpleural scarring. Other: Bilateral carotid bulb calcified plaques. IMPRESSION: 1. No acute intracranial pathology. 2. No acute/traumatic cervical spine pathology. 3. No acute facial bone fractures. Electronically Signed   By: Elgie Collard M.Carpenter.   On: 11/08/2020 21:32        Scheduled Meds:  amLODipine  10 mg Oral Daily   aspirin EC  81 mg Oral Daily   bisoprolol  5 mg Oral Daily   buPROPion  150 mg Oral Daily   clonazePAM  0.5 mg Oral BID   DULoxetine  60 mg Oral Daily   enoxaparin (LOVENOX) injection  40 mg Subcutaneous Q24H   gabapentin  100 mg Oral BID   irbesartan  300 mg Oral Daily   memantine  10 mg Oral BID   mometasone-formoterol  2 puff Inhalation BID   OXcarbazepine  300 mg Oral QHS   And   OXcarbazepine  150 mg Oral Daily   rivastigmine  1.5 mg Oral BID   Continuous Infusions:   LOS: 0 days    Time spent: 32 mins     Charise Killian, MD [facility] Pager [phone]  If 7PM-7AM, please contact night-coverage 11/10/2020, 1:56 PM

## 2020-11-10 NOTE — Progress Notes (Signed)
OT Cancellation Note  Patient Details Name: Samantha Carpenter MRN: [id number] DOB: 12-07-1940   Cancelled Treatment:    Reason Eval/Treat Not Completed: Other (comment). Duplicate OT order received. No new events.Please see OT evaluation from 11/09/20.  Jackquline Denmark, MS, OTR/L , CBIS ascom [phone]  11/10/20, 8:37 AM

## 2020-11-11 DIAGNOSIS — F0390 Unspecified dementia without behavioral disturbance: Secondary | ICD-10-CM | POA: Diagnosis not present

## 2020-11-11 DIAGNOSIS — I10 Essential (primary) hypertension: Secondary | ICD-10-CM | POA: Diagnosis not present

## 2020-11-11 DIAGNOSIS — S2231XA Fracture of one rib, right side, initial encounter for closed fracture: Secondary | ICD-10-CM | POA: Diagnosis not present

## 2020-11-11 LAB — BASIC METABOLIC PANEL
Anion gap: 7 (ref 5–15)
BUN: 15 mg/dL (ref 8–23)
CO2: 30 mmol/L (ref 22–32)
Calcium: 8.7 mg/dL — ABNORMAL LOW (ref 8.9–10.3)
Chloride: 101 mmol/L (ref 98–111)
Creatinine, Ser: 0.89 mg/dL (ref 0.44–1.00)
GFR, Estimated: >60 mL/min (ref >60)
Glucose, Bld: 101 mg/dL — ABNORMAL HIGH (ref 70–99)
Potassium: 3.6 mmol/L (ref 3.5–5.1)
Sodium: 138 mmol/L (ref 135–145)

## 2020-11-11 LAB — CBC
HCT: 36.4 % (ref 36.0–46.0)
Hemoglobin: 12.1 g/dL (ref 12.0–15.0)
MCH: 32.4 pg (ref 26.0–34.0)
MCHC: 33.2 g/dL (ref 30.0–36.0)
MCV: 97.6 fL (ref 80.0–100.0)
Platelets: 269 10*3/uL (ref 150–400)
RBC: 3.73 MIL/uL — ABNORMAL LOW (ref 3.87–5.11)
RDW: 13.1 % (ref 11.5–15.5)
WBC: 6 10*3/uL (ref 4.0–10.5)
nRBC: 0 % (ref 0.0–0.2)

## 2020-11-11 MED ORDER — ACETAMINOPHEN 325 MG PO TABS
650.0000 mg | ORAL_TABLET | Freq: Four times a day (QID) | ORAL | Status: DC | PRN
  Administered 2020-11-11 – 2020-11-12 (×2): 650 mg via ORAL
  Filled 2020-11-11 (×2): qty 2

## 2020-11-11 NOTE — Progress Notes (Signed)
MD Fabienne Bruns) aware patient refusing IV access.

## 2020-11-11 NOTE — Plan of Care (Signed)

## 2020-11-11 NOTE — TOC Progression Note (Addendum)
Transition of Care [facility]) - Progression Note    Patient Details  Name: Cassara Nida MRN: [id number] Date of Birth: 29-Aug-1940  [facility] CM/SW Contact  Barrie Dunker, RN Phone Number: 11/11/2020, 8:53 AM  Clinical Narrative:    Apartment manager Starlene called and left a voice mail that she is taking care of the patient's animals and would like to be called when the patient is released to go home, she will check on her, Aquilla Hacker is sending SW to help get applied for Medicaid and work on long term placement, contacted [employer] and did a referral with Casilda Carls , called [facility] transport to complete application to help with transport to the doctor. They will not be able to take her to her doctor due to not being a Contractor, they stated that Gavin Potters is not part of [facility] and therefore not able to provide transportation, I notifed the [facility] agency that she will need transportation asssit to the doctor as well if possible   Expected Discharge Plan: Home w Home Health Services Barriers to Discharge: Barriers Resolved  Expected Discharge Plan and Services Expected Discharge Plan: Home w Home Health Services   Discharge Planning Services: CM Consult   Living arrangements for the past 2 months: Apartment                 DME Arranged: N/A         HH Arranged: PT HH Agency: [facility] Date [facility] Agency Contacted: 11/10/20 Time HH Agency Contacted: [phone] Representative spoke with at [facility] Agency: Maralyn Sago   Social Determinants of Health (SDOH) Interventions    Readmission Risk Interventions No flowsheet data found.

## 2020-11-11 NOTE — Progress Notes (Addendum)
Physical Therapy Treatment Patient Details Name: Samantha Carpenter MRN: [id number] DOB: 06-12-1940 Today's Date: 11/11/2020    History of Present Illness 80 y.o. female with history of dementia, hypertension, COPD, chronic kidney disease who was initially here in the emergency department after she had a mechanical fall.  Imaging was unremarkable and patient was waiting for a ride home.  States she got up from her chair to go to the bathroom and it "sprung me forward" and she fell.  She is not sure if she hit her head but denies headache, neck pain, loss of consciousness. Pt with new R 7th rib fx.    PT Comments    Pt was long sitting in bed upon arriving. She is awake and oriented x 3. Somewhat confused about situation with baseline cognition deficits. She is cooperative and motivated to return home. Poor overall safety awareness. Constant encouragement to use RW at DC. She ambulated with RW and without RW. Pt is extremely unsteady without AD. She did however demonstrate safe enough abilities to return home with HHPT. Highly recommend continued skilled PT to address deficits while maximizing independence/safety with ADLs.    Follow Up Recommendations  Home health PT;Supervision - Intermittent     Equipment Recommendations  Rolling walker with 5" wheels       Precautions / Restrictions Precautions Precautions: Fall Restrictions Weight Bearing Restrictions: No    Mobility  Bed Mobility Overal bed mobility: Modified Independent     Transfers Overall transfer level: Needs assistance Equipment used: None Transfers: Sit to/from Stand Sit to Stand: Supervision    Ambulation/Gait Ambulation/Gait assistance: Supervision Gait Distance (Feet): 400 Feet Assistive device: Rolling walker (2 wheeled);None (200 with RW and 200 without AD) Gait Pattern/deviations: Step-through pattern Gait velocity: WNL   General Gait Details: pt ambulated 2 laps. 1 lap with RW, 1 lap without AD. pt is high  fall risk and author highly sugest pt use RW at all times at DC.   Stairs Stairs: Yes Stairs assistance: Min guard Stair Management: No rails;Backwards;With walker;Forwards;Step to pattern Number of Stairs: 1 General stair comments: pt performed "curb step" to simulate environment in which she fell. Highly recommend pt use RW at all times.     Balance Overall balance assessment: Needs assistance Sitting-balance support: Feet unsupported;No upper extremity supported Sitting balance-Leahy Scale: Good     Standing balance support: During functional activity;Bilateral upper extremity supported Standing balance-Leahy Scale: Good Standing balance comment: good balance with BUE support. poor balance without AD. extremely high fall risk due to cognition and poor safety awareness      Cognition Arousal/Alertness: Awake/alert Behavior During Therapy: WFL for tasks assessed/performed Overall Cognitive Status: History of cognitive impairments - at baseline Area of Impairment: Orientation;Attention;Following commands;Safety/judgement;Awareness;Problem solving;Memory      Orientation Level: Situation Current Attention Level: Focused Memory: Decreased recall of precautions;Decreased short-term memory Following Commands: Follows one step commands consistently Safety/Judgement: Decreased awareness of safety;Decreased awareness of deficits   Problem Solving: Slow processing;Decreased initiation;Difficulty sequencing;Requires verbal cues General Comments: Pt does demonstrate slightly improved cognition today versus previous date however continues to be disoriented to situation with poor insight of safety concerns.             Pertinent Vitals/Pain Pain Assessment: 0-10 Pain Score: 3  Faces Pain Scale: Hurts a little bit Pain Location: R rib Pain Descriptors / Indicators: Aching;Discomfort Pain Intervention(s): Limited activity within patient's tolerance;Monitored during  session;Premedicated before session;Repositioned     PT Goals (current goals can now be found in  the care plan section) Acute Rehab PT Goals Patient Stated Goal: go home Progress towards PT goals: Progressing toward goals    Frequency    7X/week      PT Plan Current plan remains appropriate       AM-PAC PT "6 Clicks" Mobility   Outcome Measure  Help needed turning from your back to your side while in a flat bed without using bedrails?: None Help needed moving from lying on your back to sitting on the side of a flat bed without using bedrails?: A Little Help needed moving to and from a bed to a chair (including a wheelchair)?: A Little Help needed standing up from a chair using your arms (e.g., wheelchair or bedside chair)?: A Little Help needed to walk in hospital room?: A Little Help needed climbing 3-5 steps with a railing? : A Little 6 Click Score: 19    End of Session Equipment Utilized During Treatment: Gait belt Activity Tolerance: Patient tolerated treatment well Patient left: in chair;with call bell/phone within reach;with chair alarm set;with nursing/sitter in room Nurse Communication: Mobility status PT Visit Diagnosis: Unsteadiness on feet (R26.81);Repeated falls (R29.6);Pain Pain - Right/Left: Right Pain - part of body:  (ribs)     Time: 1324-4010 PT Time Calculation (min) (ACUTE ONLY): 26 min  Charges:  $Gait Training: 23-37 mins                     Jetta Lout PTA 11/11/20, 10:57 AM

## 2020-11-11 NOTE — Progress Notes (Signed)
PROGRESS NOTE    Samantha Carpenter  LXB:[id number] DOB: [birth year] DOA: 11/09/2020 PCP: Marguarite Arbour, MD   Assessment & Plan:   Principal Problem:   Right rib fracture Active Problems:   Dementia, senile, without behavioral disturbance (HCC)   Fall at home, initial encounter   COPD (chronic obstructive pulmonary disease) (HCC)   Depression with anxiety   Elevated troponin   Acute respiratory failure with hypoxia (HCC)   HTN (hypertension)  Right rib fracture: CT chest showed a nondisplaced fracture of the right posterior 7th rib. No pneumothorax. Secondary to fall at home. Encourage incentive spirometry. Norco, tylenol prn pain. PT/OT consulted    Dementia: continue on home dose exelon, namenda. Lives alone, still drives and has little to no support at home. Refuses to go to a nursing home. Strongly recommended pt not to drive anymore. CM is working on resources for this pt   Acute hypoxic respiratory failure: likely secondary to COPD. Does not use oxygen at home.  Patient has normal oxygen saturation at at rest, but desaturates to 85% on ambulation.  Likely multifactorial etiology, including COPD and rib fracture. Encourage incentive spirometry. Continue on bronchodilators. Weaned off of supplemental oxygen   Depression: severity unknown. Continue on home dose of wellbutrin, duloxetine  Anxiety: severity unknown. Continue on home dose of klonopin    HTN: continue on home dose of bisoprolol, amlodipine    Elevated troponin: no chest pain. Likely secondary to demand ischemia   DVT prophylaxis: lovenox  Code Status: full  Family Communication:  Disposition Plan: unclear, likely back home  Level of care: Med-Surg  Status is: Observation  The patient remains OBS appropriate and will d/c before 2 midnights.  Dispo: The patient is from: Home              Anticipated d/c is to:  unclear              Patient currently is not medically stable to d/c.   Difficult to place  patient Yes     Consultants:    Procedures:   Antimicrobials:   Subjective: Pt still c/o rib cage pain   Objective: Vitals:   11/10/20 1158 11/10/20 1959 11/11/20 0638 11/11/20 0739  BP: (!) 129/56 (!) 140/56 (!) 164/77 (!) 155/56  Pulse: (!) 54 62 63 61  Resp: 17 18 18    Temp: 98.6 F (37 C) 98.3 F (36.8 C) 98.3 F (36.8 C) 98.1 F (36.7 C)  TempSrc:    Oral  SpO2: 92% 92% 93% 90%  Weight:      Height:        Intake/Output Summary (Last 24 hours) at 11/11/2020 0748 Last data filed at 11/10/2020 1848 Gross per 24 hour  Intake 480 ml  Output --  Net 480 ml   Filed Weights   11/08/20 2007  Weight: 63 kg    Examination:  General exam: Appears calm but uncomfortable  Respiratory system: decreased breath sounds b/l  Cardiovascular system: S1/S2+. No rubs or clicks  Gastrointestinal system: Abd is soft, NT,ND & hypoactive bowel sounds  Central nervous system: Alert and awake. Moves all extremities  Psychiatry: Judgement and insight appear abnormal. Flat mood and affect     Data Reviewed: I have personally reviewed following labs and imaging studies  CBC: Recent Labs  Lab 11/09/20 0600 11/11/20 0543  WBC 6.2 6.0  NEUTROABS 3.6  --   HGB 12.7 12.1  HCT 37.9 36.4  MCV 97.4 97.6  PLT 277 269  Basic Metabolic Panel: Recent Labs  Lab 11/09/20 0600 11/11/20 0543  NA 139 138  K 3.8 3.6  CL 99 101  CO2 31 30  GLUCOSE 106* 101*  BUN 18 15  CREATININE 0.73 0.89  CALCIUM 9.3 8.7*   GFR: Estimated Creatinine Clearance: 43.3 mL/min (by C-G formula based on SCr of 0.89 mg/dL). Liver Function Tests: No results for input(s): AST, ALT, ALKPHOS, BILITOT, PROT, ALBUMIN in the last 168 hours. No results for input(s): LIPASE, AMYLASE in the last 168 hours. No results for input(s): AMMONIA in the last 168 hours. Coagulation Profile: Recent Labs  Lab 11/09/20 1229  INR 0.9   Cardiac Enzymes: No results for input(s): CKTOTAL, CKMB, CKMBINDEX,  TROPONINI in the last 168 hours. BNP (last 3 results) No results for input(s): PROBNP in the last 8760 hours. HbA1C: Recent Labs    11/09/20 1229  HGBA1C 6.1*   CBG: No results for input(s): GLUCAP in the last 168 hours. Lipid Profile: Recent Labs    11/10/20 0506  CHOL 180  HDL 78  LDLCALC 85  TRIG 83  CHOLHDL 2.3   Thyroid Function Tests: No results for input(s): TSH, T4TOTAL, FREET4, T3FREE, THYROIDAB in the last 72 hours. Anemia Panel: No results for input(s): VITAMINB12, FOLATE, FERRITIN, TIBC, IRON, RETICCTPCT in the last 72 hours. Sepsis Labs: No results for input(s): PROCALCITON, LATICACIDVEN in the last 168 hours.  Recent Results (from the past 240 hour(s))  Resp Panel by RT-PCR (Flu A&B, Covid) Nasopharyngeal Swab     Status: None   Collection Time: 11/09/20  6:00 AM   Specimen: Nasopharyngeal Swab; Nasopharyngeal(NP) swabs in vial transport medium  Result Value Ref Range Status   SARS Coronavirus 2 by RT PCR NEGATIVE NEGATIVE Final    Comment: (NOTE) SARS-CoV-2 target nucleic acids are NOT DETECTED.  The SARS-CoV-2 RNA is generally detectable in upper respiratory specimens during the acute phase of infection. The lowest concentration of SARS-CoV-2 viral copies this assay can detect is 138 copies/mL. A negative result does not preclude SARS-Cov-2 infection and should not be used as the sole basis for treatment or other patient management decisions. A negative result may occur with  improper specimen collection/handling, submission of specimen other than nasopharyngeal swab, presence of viral mutation(s) within the areas targeted by this assay, and inadequate number of viral copies(<138 copies/mL). A negative result must be combined with clinical observations, patient history, and epidemiological information. The expected result is Negative.  Fact Sheet for Patients:  BloggerCourse.com  Fact Sheet for Healthcare Providers:   SeriousBroker.it  This test is no t yet approved or cleared by the [place] FDA and  has been authorized for detection and/or diagnosis of SARS-CoV-2 by FDA under an Emergency Use Authorization (EUA). This EUA will remain  in effect (meaning this test can be used) for the duration of the COVID-19 declaration under Section 564(b)(1) of the Act, 21 U.S.C.section 360bbb-3(b)(1), unless the authorization is terminated  or revoked sooner.       Influenza A by PCR NEGATIVE NEGATIVE Final   Influenza B by PCR NEGATIVE NEGATIVE Final    Comment: (NOTE) The Xpert Xpress SARS-CoV-2/FLU/RSV plus assay is intended as an aid in the diagnosis of influenza from Nasopharyngeal swab specimens and should not be used as a sole basis for treatment. Nasal washings and aspirates are unacceptable for Xpert Xpress SARS-CoV-2/FLU/RSV testing.  Fact Sheet for Patients: BloggerCourse.com  Fact Sheet for Healthcare Providers: SeriousBroker.it  This test is not yet approved or cleared by  the [employer] and has been authorized for detection and/or diagnosis of SARS-CoV-2 by FDA under an Emergency Use Authorization (EUA). This EUA will remain in effect (meaning this test can be used) for the duration of the COVID-19 declaration under Section 564(b)(1) of the Act, 21 U.S.C. section 360bbb-3(b)(1), unless the authorization is terminated or revoked.  Performed at [facility], [address], [place], [place] [address]          Radiology Studies: No results found.      Scheduled Meds:  amLODipine  10 mg Oral Daily   aspirin EC  81 mg Oral Daily   bisoprolol  5 mg Oral Daily   buPROPion  150 mg Oral Daily   clonazePAM  0.5 mg Oral BID   DULoxetine  60 mg Oral Daily   enoxaparin (LOVENOX) injection  40 mg Subcutaneous Q24H   gabapentin  100 mg Oral BID   irbesartan  300 mg Oral Daily   memantine   10 mg Oral BID   mometasone-formoterol  2 puff Inhalation BID   OXcarbazepine  300 mg Oral QHS   And   OXcarbazepine  150 mg Oral Daily   rivastigmine  1.5 mg Oral BID   Continuous Infusions:   LOS: 0 days    Time spent: 30 mins     Charise Killian, MD [facility] Pager [phone]  If 7PM-7AM, please contact night-coverage 11/11/2020, 7:48 AM

## 2020-11-12 DIAGNOSIS — F0390 Unspecified dementia without behavioral disturbance: Secondary | ICD-10-CM | POA: Diagnosis not present

## 2020-11-12 DIAGNOSIS — S2231XA Fracture of one rib, right side, initial encounter for closed fracture: Secondary | ICD-10-CM | POA: Diagnosis not present

## 2020-11-12 DIAGNOSIS — I10 Essential (primary) hypertension: Secondary | ICD-10-CM | POA: Diagnosis not present

## 2020-11-12 LAB — BASIC METABOLIC PANEL
Anion gap: 8 (ref 5–15)
BUN: 20 mg/dL (ref 8–23)
CO2: 32 mmol/L (ref 22–32)
Calcium: 9 mg/dL (ref 8.9–10.3)
Chloride: 95 mmol/L — ABNORMAL LOW (ref 98–111)
Creatinine, Ser: 0.89 mg/dL (ref 0.44–1.00)
GFR, Estimated: >60 mL/min (ref >60)
Glucose, Bld: 121 mg/dL — ABNORMAL HIGH (ref 70–99)
Potassium: 3.9 mmol/L (ref 3.5–5.1)
Sodium: 135 mmol/L (ref 135–145)

## 2020-11-12 LAB — CBC
HCT: 40 % (ref 36.0–46.0)
Hemoglobin: 13.2 g/dL (ref 12.0–15.0)
MCH: 32.6 pg (ref 26.0–34.0)
MCHC: 33 g/dL (ref 30.0–36.0)
MCV: 98.8 fL (ref 80.0–100.0)
Platelets: 282 10*3/uL (ref 150–400)
RBC: 4.05 MIL/uL (ref 3.87–5.11)
RDW: 13.2 % (ref 11.5–15.5)
WBC: 7.6 10*3/uL (ref 4.0–10.5)
nRBC: 0 % (ref 0.0–0.2)

## 2020-11-12 MED ORDER — HYDROCODONE-ACETAMINOPHEN 5-325 MG PO TABS: 1.0000 | ORAL_TABLET | Freq: Four times a day (QID) | ORAL | 0 refills | Status: AC | PRN

## 2020-11-12 NOTE — TOC Transition Note (Addendum)
Transition of Care [facility]) - CM/SW Discharge Note   Patient Details  Name: Samantha Carpenter MRN: [id number] Date of Birth: 26-Nov-1940  Transition of Care [facility] CM/SW Contact:  Liliana Cline, LCSW Phone Number: 11/12/2020, 2:39 PM   Clinical Narrative:   Patient to discharge home today. Updated Apartment Manager Starlene with patient's permission. Starlene wanted to make sure we know patient is in a 2nd floor apartment. Notified MD and PT. PT and patient are comfortable with patient using stairs to 2nd floor apartment. [facility] RN, PT, and Aide services have been arranged. Notified Sarah with [facility] of discharge. Left VM for patient's sister, Carney Bern per patient's request. Confirmed with team patient is safe for cab transport. Cab voucher provided. Home address confirmed prior. Asked RN to let cab driver know that patient's key is in the apartment office (per Leeper. open til 5).    Final next level of care: Home w Home Health Services Barriers to Discharge: Barriers Resolved   Patient Goals and CMS Choice Patient states their goals for this hospitalization and ongoing recovery are:: home with home health CMS Medicare.gov Compare Post Acute Care list provided to:: Patient Choice offered to / list presented to : Patient  Discharge Placement                Patient to be transferred to facility by: cab Name of family member notified: patient and sister Carney Bern) Patient and family notified of of transfer: 11/12/20  Discharge Plan and Services   Discharge Planning Services: CM Consult            DME Arranged: N/A         HH Arranged: RN, PT, Nurse's Aide HH Agency: [facility] Date [facility] Agency Contacted: 11/12/20 Time HH Agency Contacted: [phone] Representative spoke with at [facility] Agency: Maralyn Sago  Social Determinants of Health (SDOH) Interventions     Readmission Risk Interventions No flowsheet data found.

## 2020-11-12 NOTE — Plan of Care (Signed)

## 2020-11-12 NOTE — Discharge Summary (Signed)
Physician Discharge Summary  Samantha Carpenter DXA:[id number] DOB: 11/19/1940 DOA: 11/09/2020  PCP: Marguarite Arbour, MD  Admit date: 11/09/2020 Discharge date: 11/12/2020  Admitted From: home  Disposition:  home w/ home health (please see CM notes)  Recommendations for Outpatient Follow-up:  Follow up with PCP in 1-2 weeks   Home Health: yes Equipment/Devices:  Discharge Condition: stable  CODE STATUS: full  Diet recommendation: Heart Healthy  Brief/Interim Summary: HPI was taken from Dr. Clyde Lundborg: Samantha Carpenter is a 80 y.o. female with medical history significant of COPD not on oxygen at home, hyperlipidemia, depression, anxiety, dementia, who presents with fall.   Pt was initially presented to ED due to mechanical fall. The patient states that she fell accidentally when she was walking outside and tripped on the curb yesterday morning.  She has small area of skin abrasion in right cheek and right hand.  No LOC.  No unilateral numbness or tingling in extremities.  No facial droop or slurred speech.  The initial images were unremarkable, including CT of head, CT of C-spine and CT of maxillofacial image. Per report, she got up from her chair to go to the bathroom, and fell again in ED. She has pain in right hand, right knee and right wrist. She also complains of right rib cage pain.  She has mild dry cough and mild shortness of breath which she attributes to COPD.  No fever or chills.  Denies nausea, vomiting, diarrhea or abdominal pain.  No symptoms of UTI.   Patient was found to have oxygen saturation 96% on room air, which decreased to 85 on ambulation ED.  She is not using oxygen at home.   ED Course: pt was found to have WBC 6.2, troponin level 25 --> 25, negative COVID PCR, electrolytes renal function okay, GFR> 60, temperature normal, blood pressure 218/75, 179/77, heart rate 83, RR 18.  The repeated the CTA of head and CT of the C-spine negative.  CT of chest showed right seventh rib  fracture.  Negative x-ray for right hand, right knee and right wrist.  Patient is placed on MedSurg bed for observation.  Hospital course from Dr. Mayford Knife 8/25-8/27/22: Pt presented after a fall at home and found to have right rib fracture of the posterior 7th rib. Pt was treated w/ supportive care w/ pain meds prn, incentive spirometry & supplemental oxygen. Supplemental oxygen was able to weaned off prior to d/c. Of note, pt has hx of dementia and lives alone and has little to no support at home. Pt refuses to go to a nursing home. Strongly recommended pt not to drive anymore and pt agreed. CM was consulted for above social issues and CM set [facility] w/ PT/RN/SW as PT/OT evaluated the pt and recommended HH. For more information, please see previous progress notes.   Discharge Diagnoses:  Principal Problem:   Right rib fracture Active Problems:   Dementia, senile, without behavioral disturbance (HCC)   Fall at home, initial encounter   COPD (chronic obstructive pulmonary disease) (HCC)   Depression with anxiety   Elevated troponin   Acute respiratory failure with hypoxia (HCC)   HTN (hypertension)  Right rib fracture: CT chest showed a nondisplaced fracture of the right posterior 7th rib. No pneumothorax. Secondary to fall at home. Encourage incentive spirometry. Norco, tylenol prn pain. PT/OT consulted    Dementia: continue on home dose exelon, namenda. Lives alone, still drives and has little to no support at home. Refuses to go to a nursing home.  Strongly recommended pt not to drive anymore. CM is working on resources for this pt   Acute hypoxic respiratory failure: resolved   Depression: severity unknown. Continue on home dose of wellbutrin, duloxetine  Anxiety: severity unknown. Continue on home dose of klonopin    HTN: continue on home dose of bisoprolol, amlodipine    Elevated troponin: no chest pain. Likely secondary to demand ischemia   Discharge Instructions  Discharge  Instructions     Diet - low sodium heart healthy   Complete by: As directed    Discharge instructions   Complete by: As directed    F/u w/ PCP in 1 week   Increase activity slowly   Complete by: As directed       Allergies as of 11/12/2020       Reactions   Morphine And Related    Latex Rash   "I break out"        Medication List     STOP taking these medications    hydrALAZINE 50 MG tablet Commonly known as: APRESOLINE   hydrochlorothiazide 12.5 MG tablet Commonly known as: HYDRODIURIL   traMADol 50 MG tablet Commonly known as: Ultram   triamterene-hydrochlorothiazide 37.5-25 MG capsule Commonly known as: DYAZIDE       TAKE these medications    acyclovir 800 MG tablet Commonly known as: ZOVIRAX Take by mouth.   Advair HFA 115-21 MCG/ACT inhaler Generic drug: fluticasone-salmeterol Inhale 2 puffs into the lungs every 12 (twelve) hours.   albuterol 108 (90 Base) MCG/ACT inhaler Commonly known as: VENTOLIN HFA Inhale 2 puffs into the lungs every 6 (six) hours as needed for wheezing or shortness of breath.   amLODipine 10 MG tablet Commonly known as: NORVASC Take 1 tablet (10 mg total) by mouth daily.   aspirin EC 81 MG tablet Take 81 mg by mouth daily.   bisoprolol 5 MG tablet Commonly known as: ZEBETA Take 5 mg by mouth daily.   buPROPion 150 MG 24 hr tablet Commonly known as: WELLBUTRIN XL Take 150 mg by mouth daily.   clonazePAM 1 MG tablet Commonly known as: KLONOPIN Take by mouth 2 (two) times daily.   DULoxetine 60 MG capsule Commonly known as: CYMBALTA Take by mouth daily.   gabapentin 100 MG capsule Commonly known as: NEURONTIN Take by mouth 2 (two) times daily.   HYDROcodone-acetaminophen 5-325 MG tablet Commonly known as: Norco Take 1 tablet by mouth every 6 (six) hours as needed for up to 5 days for moderate pain or severe pain. What changed: reasons to take this   ipratropium-albuterol 0.5-2.5 (3) MG/3ML Soln Commonly  known as: DUONEB Inhale 3 mLs into the lungs in the morning, at noon, in the evening, and at bedtime.   irbesartan 300 MG tablet Commonly known as: AVAPRO Take 300 mg by mouth daily.   lidocaine 5 % Commonly known as: LIDODERM Place 1 patch onto the skin daily. Remove & Discard patch within 12 hours or as directed by MD   memantine 10 MG tablet Commonly known as: NAMENDA Take 10 mg by mouth 2 (two) times daily.   OXcarbazepine 150 MG tablet Commonly known as: TRILEPTAL 1 po qam qnd 2 po qhs   rivastigmine 1.5 MG capsule Commonly known as: EXELON Take 1.5 mg by mouth 2 (two) times daily.        Follow-up Information     Sparks, Duane Lope, MD.   Specialty: Internal Medicine Why: As needed Contact information: [address] [facility] [place] [address] [phone]                Allergies  Allergen Reactions   Morphine And Related    Latex Rash    "I break out"    Consultations:    Procedures/Studies: DG Wrist Complete Right  Result Date: 11/09/2020 CLINICAL DATA:  Fall and trauma to the right hand. EXAM: RIGHT WRIST - COMPLETE 3+ VIEW; RIGHT HAND - COMPLETE 3+ VIEW COMPARISON:  None. FINDINGS: There is no acute fracture or dislocation. The bones are osteopenic. There is arthritic changes of the distal interphalangeal joints. The soft tissues are unremarkable. IMPRESSION: No acute fracture or dislocation. Electronically Signed   By: Elgie Collard M.D.   On: 11/09/2020 03:17   CT HEAD WO CONTRAST ( )  Result Date: 11/09/2020 CLINICAL DATA:  80 year old female status post fall out of chair. EXAM: CT HEAD WITHOUT CONTRAST TECHNIQUE: Contiguous axial images were obtained from the base of the skull through the vertex without intravenous contrast. COMPARISON:  Brain MRI 09/01/2018.  Head CT 11/08/2020. FINDINGS: Brain: No midline shift, ventriculomegaly, mass effect, evidence of mass lesion, intracranial hemorrhage or evidence of  cortically based acute infarction. Stable patchy and confluent bilateral cerebral white matter hypodensity since yesterday. Vascular: Calcified atherosclerosis at the skull base. No suspicious intracranial vascular hyperdensity. Skull: Stable comment intact. Sinuses/Orbits: Visualized paranasal sinuses and mastoids are stable and well aerated. Other: No orbit or scalp soft tissue injury identified. IMPRESSION: Stable. No acute intracranial abnormality or acute traumatic injury identified. Electronically Signed   By: Odessa Fleming M.D.   On: 11/09/2020 04:06   CT HEAD WO CONTRAST ( )  Result Date: 11/08/2020 CLINICAL DATA:  Facial trauma. EXAM: CT HEAD WITHOUT CONTRAST CT MAXILLOFACIAL WITHOUT CONTRAST CT CERVICAL SPINE WITHOUT CONTRAST TECHNIQUE: Multidetector CT imaging of the head, cervical spine, and maxillofacial structures were performed using the standard protocol without intravenous contrast. Multiplanar CT image reconstructions of the cervical spine and maxillofacial structures were also generated. COMPARISON:  Brain MRI dated 09/01/2018. FINDINGS: CT HEAD FINDINGS Brain: Mild age-related atrophy and chronic microvascular ischemic changes. There is no acute intracranial hemorrhage. No mass effect or midline shift. No extra-axial fluid collection. Vascular: No hyperdense vessel or unexpected calcification. Skull: Normal. Negative for fracture or focal lesion. Other: None. CT MAXILLOFACIAL FINDINGS Osseous: No fracture or mandibular dislocation. No destructive process. Orbits: The globes and retro-orbital fat are preserved. Sinuses: Clear. Soft tissues: Mild right facial and periorbital contusion. CT CERVICAL SPINE FINDINGS Alignment: No acute subluxation. There is straightening of normal cervical lordosis which may be positional or due to muscle spasm. Grade 1 C3-C4 anterolisthesis. Skull base and vertebrae: No acute fracture.  Osteopenia. Soft tissues and spinal canal: No prevertebral fluid or swelling. No  visible canal hematoma. Disc levels: Multilevel degenerative changes with disc space narrowing and endplate irregularity and spurring. Multilevel facet arthropathy. Upper chest: Biapical subpleural scarring. Other: Bilateral carotid bulb calcified plaques. IMPRESSION: 1. No acute intracranial pathology. 2. No acute/traumatic cervical spine pathology. 3. No acute facial bone fractures. Electronically Signed   By: Elgie Collard M.D.   On: 11/08/2020 21:32   CT CHEST WO CONTRAST  Result Date: 11/09/2020 CLINICAL DATA:  80 year old female status post fall out of chair. Right breast pain, rib fracture suspected. EXAM: CT CHEST WITHOUT CONTRAST TECHNIQUE: Multidetector CT imaging of the chest was performed following the standard protocol without IV contrast. COMPARISON:  Chest radiographs 05/28/2019. FINDINGS: Cardiovascular: Calcified aortic atherosclerosis. Calcified coronary artery atherosclerosis. No cardiomegaly  or pericardial effusion. Mediastinum/Nodes: No mediastinal hematoma or lymphadenopathy is evident in the absence of IV contrast. Lungs/Pleura: Major airways are patent. Centrilobular upper lobe emphysema with confluent biapical scarring, partially calcified on the right. No pneumothorax. No pleural effusion. No pulmonary contusion. No lung nodule or acute pulmonary opacity identified. Upper Abdomen: Absent gallbladder. Negative visible noncontrast liver, spleen, pancreas, adrenal glands, kidneys, and bowel in the upper abdomen. Musculoskeletal: Subtle anterolisthesis of T11 on T12 is associated with chronic facet hypertrophy and degeneration there. Mild chronic appearing T7 and T9 superior endplate compression and/or Schmorl's nodes. Visible shoulder osseous structures appear intact. There is osteopenia of the ribs. No convincing anterior or lateral right rib fracture. However, there is a nondisplaced fracture of the right posterior 7th rib on series 4, image 62. No contralateral acute left rib  fracture is identified. No superficial soft tissue injury identified. IMPRESSION: 1. Osteopenia of the ribs, with no convincing anterolateral right rib fracture, however, there is a nondisplaced fracture of the right posterior 7th rib. 2. No other acute traumatic injury identified in the noncontrast chest. 3. Aortic Atherosclerosis (ICD10-I70.0) and Emphysema (ICD10-J43.9). Electronically Signed   By: Odessa Fleming M.D.   On: 11/09/2020 04:19   CT Cervical Spine Wo Contrast  Result Date: 11/09/2020 CLINICAL DATA:  80 year old female status post fall out of chair. EXAM: CT CERVICAL SPINE WITHOUT CONTRAST TECHNIQUE: Multidetector CT imaging of the cervical spine was performed without intravenous contrast. Multiplanar CT image reconstructions were also generated. COMPARISON:  CT face and cervical spine 11/08/2020. FINDINGS: Alignment: Stable. Mild straightening of cervical lordosis with mild anterolisthesis of C3 on C4. Subtle retrolisthesis of C4 on C5 and C5 on C6. Bilateral posterior element alignment is within normal limits. Skull base and vertebrae: Visualized skull base is intact. No atlanto-occipital dissociation. C1 and C2 appear intact and aligned. No acute osseous abnormality identified. Soft tissues and spinal canal: No prevertebral fluid or swelling. No visible canal hematoma. Dominant left vertebral artery. Bulky calcified carotid atherosclerosis in the bilateral neck. Disc levels: Stable cervical spine degeneration, including severe facet arthropathy on the right at C3-C4 with vacuum facet there. Upper chest: The T1 level appear stable and intact. Chest CT is reported separately. IMPRESSION: 1. No acute traumatic injury identified in the cervical spine. 2. Bulky calcified carotid atherosclerosis in the neck. Electronically Signed   By: Odessa Fleming M.D.   On: 11/09/2020 04:10   CT Cervical Spine Wo Contrast  Result Date: 11/08/2020 CLINICAL DATA:  Facial trauma. EXAM: CT HEAD WITHOUT CONTRAST CT  MAXILLOFACIAL WITHOUT CONTRAST CT CERVICAL SPINE WITHOUT CONTRAST TECHNIQUE: Multidetector CT imaging of the head, cervical spine, and maxillofacial structures were performed using the standard protocol without intravenous contrast. Multiplanar CT image reconstructions of the cervical spine and maxillofacial structures were also generated. COMPARISON:  Brain MRI dated 09/01/2018. FINDINGS: CT HEAD FINDINGS Brain: Mild age-related atrophy and chronic microvascular ischemic changes. There is no acute intracranial hemorrhage. No mass effect or midline shift. No extra-axial fluid collection. Vascular: No hyperdense vessel or unexpected calcification. Skull: Normal. Negative for fracture or focal lesion. Other: None. CT MAXILLOFACIAL FINDINGS Osseous: No fracture or mandibular dislocation. No destructive process. Orbits: The globes and retro-orbital fat are preserved. Sinuses: Clear. Soft tissues: Mild right facial and periorbital contusion. CT CERVICAL SPINE FINDINGS Alignment: No acute subluxation. There is straightening of normal cervical lordosis which may be positional or due to muscle spasm. Grade 1 C3-C4 anterolisthesis. Skull base and vertebrae: No acute fracture.  Osteopenia. Soft tissues and  spinal canal: No prevertebral fluid or swelling. No visible canal hematoma. Disc levels: Multilevel degenerative changes with disc space narrowing and endplate irregularity and spurring. Multilevel facet arthropathy. Upper chest: Biapical subpleural scarring. Other: Bilateral carotid bulb calcified plaques. IMPRESSION: 1. No acute intracranial pathology. 2. No acute/traumatic cervical spine pathology. 3. No acute facial bone fractures. Electronically Signed   By: Elgie Collard M.D.   On: 11/08/2020 21:32   DG Knee Complete 4 Views Right  Result Date: 11/09/2020 CLINICAL DATA:  Fall, right knee pain EXAM: RIGHT KNEE - COMPLETE 4+ VIEW COMPARISON:  None. 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 And ossific density is seen within the intercondylar notch in keeping with and intra-articular loose body. No acute fracture or dislocation. No effusion. Soft tissues are unremarkable. IMPRESSION: No acute fracture or dislocation. 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 Electronically Signed   By: Helyn Numbers M.D.   On: 11/09/2020 03:18   DG Hand Complete Right  Result Date: 11/09/2020 CLINICAL DATA:  Fall and trauma to the right hand. EXAM: RIGHT WRIST - COMPLETE 3+ VIEW; RIGHT HAND - COMPLETE 3+ VIEW COMPARISON:  None. FINDINGS: There is no acute fracture or dislocation. The bones are osteopenic. There is arthritic changes of the distal interphalangeal joints. The soft tissues are unremarkable. IMPRESSION: No acute fracture or dislocation. Electronically Signed   By: Elgie Collard M.D.   On: 11/09/2020 03:17   CT Maxillofacial Wo Contrast  Result Date: 11/08/2020 CLINICAL DATA:  Facial trauma. EXAM: CT HEAD WITHOUT CONTRAST CT MAXILLOFACIAL WITHOUT CONTRAST CT CERVICAL SPINE WITHOUT CONTRAST TECHNIQUE: Multidetector CT imaging of the head, cervical spine, and maxillofacial structures were performed using the standard protocol without intravenous contrast. Multiplanar CT image reconstructions of the cervical spine and maxillofacial structures were also generated. COMPARISON:  Brain MRI dated 09/01/2018. FINDINGS: CT HEAD FINDINGS Brain: Mild age-related atrophy and chronic microvascular ischemic changes. There is no acute intracranial hemorrhage. No mass effect or midline shift. No extra-axial fluid collection. Vascular: No hyperdense vessel or unexpected calcification. Skull: Normal. Negative for fracture or focal lesion. Other: None. CT MAXILLOFACIAL FINDINGS Osseous: No fracture or mandibular dislocation. No destructive process. Orbits: The globes and retro-orbital fat are  preserved. Sinuses: Clear. Soft tissues: Mild right facial and periorbital contusion. CT CERVICAL SPINE FINDINGS Alignment: No acute subluxation. There is straightening of normal cervical lordosis which may be positional or due to muscle spasm. Grade 1 C3-C4 anterolisthesis. Skull base and vertebrae: No acute fracture.  Osteopenia. Soft tissues and spinal canal: No prevertebral fluid or swelling. No visible canal hematoma. Disc levels: Multilevel degenerative changes with disc space narrowing and endplate irregularity and spurring. Multilevel facet arthropathy. Upper chest: Biapical subpleural scarring. Other: Bilateral carotid bulb calcified plaques. IMPRESSION: 1. No acute intracranial pathology. 2. No acute/traumatic cervical spine pathology. 3. No acute facial bone fractures. Electronically Signed   By: Elgie Collard M.D.   On: 11/08/2020 21:32   (Echo, Carotid, EGD, Colonoscopy, ERCP)    Subjective: Pt denies any complaints    Discharge Exam: Vitals:   11/12/20 1152 11/12/20 1159  BP: (!) 141/54 122/63  Pulse: (!) 57 (!) 54  Resp: 18 16  Temp: 97.6 F (36.4 C) 98 F (36.7 C)  SpO2: 90% 92%   Vitals:   11/12/20 0540 11/12/20 0849 11/12/20 1152 11/12/20 1159  BP: (!) 145/59 131/70 (!) 141/54 122/63  Pulse: (!) 59 (!)  59 (!) 57 (!) 54  Resp: 16 20 18 16   Temp: 98.6 F (37 C) 97.6 F (36.4 C) 97.6 F (36.4 C) 98 F (36.7 C)  TempSrc:      SpO2: 95% 100% 90% 92%  Weight:      Height:        General: Pt is alert, awake, not in acute distress. Frail appearing  Cardiovascular: S1/S2 +, no rubs, no gallops Respiratory: diminished breath sounds b/l otherwise clear Abdominal: Soft, NT, ND, bowel sounds + Extremities: no edema, no cyanosis    The results of significant diagnostics from this hospitalization (including imaging, microbiology, ancillary and laboratory) are listed below for reference.     Microbiology: Recent Results (from the past 240 hour(s))  Resp Panel by  RT-PCR (Flu A&B, Covid) Nasopharyngeal Swab     Status: None   Collection Time: 11/09/20  6:00 AM   Specimen: Nasopharyngeal Swab; Nasopharyngeal(NP) swabs in vial transport medium  Result Value Ref Range Status   SARS Coronavirus 2 by RT PCR NEGATIVE NEGATIVE Final    Comment: (NOTE) SARS-CoV-2 target nucleic acids are NOT DETECTED.  The SARS-CoV-2 RNA is generally detectable in upper respiratory specimens during the acute phase of infection. The lowest concentration of SARS-CoV-2 viral copies this assay can detect is 138 copies/mL. A negative result does not preclude SARS-Cov-2 infection and should not be used as the sole basis for treatment or other patient management decisions. A negative result may occur with  improper specimen collection/handling, submission of specimen other than nasopharyngeal swab, presence of viral mutation(s) within the areas targeted by this assay, and inadequate number of viral copies(<138 copies/mL). A negative result must be combined with clinical observations, patient history, and epidemiological information. The expected result is Negative.  Fact Sheet for Patients:  BloggerCourse.comhttps://www.fda.gov/media/152166/download  Fact Sheet for Healthcare Providers:  SeriousBroker.ithttps://www.fda.gov/media/152162/download  This test is no t yet approved or cleared by the [place] FDA and  has been authorized for detection and/or diagnosis of SARS-CoV-2 by FDA under an Emergency Use Authorization (EUA). This EUA will remain  in effect (meaning this test can be used) for the duration of the COVID-19 declaration under Section 564(b)(1) of the Act, 21 U.S.C.section 360bbb-3(b)(1), unless the authorization is terminated  or revoked sooner.       Influenza A by PCR NEGATIVE NEGATIVE Final   Influenza B by PCR NEGATIVE NEGATIVE Final    Comment: (NOTE) The Xpert Xpress SARS-CoV-2/FLU/RSV plus assay is intended as an aid in the diagnosis of influenza from Nasopharyngeal swab  specimens and should not be used as a sole basis for treatment. Nasal washings and aspirates are unacceptable for Xpert Xpress SARS-CoV-2/FLU/RSV testing.  Fact Sheet for Patients: BloggerCourse.comhttps://www.fda.gov/media/152166/download  Fact Sheet for Healthcare Providers: SeriousBroker.ithttps://www.fda.gov/media/152162/download  This test is not yet approved or cleared by the [place] FDA and has been authorized for detection and/or diagnosis of SARS-CoV-2 by FDA under an Emergency Use Authorization (EUA). This EUA will remain in effect (meaning this test can be used) for the duration of the COVID-19 declaration under Section 564(b)(1) of the Act, 21 U.S.C. section 360bbb-3(b)(1), unless the authorization is terminated or revoked.  Performed at [facility], [address] [place], [place] [id number]      Labs: BNP (last 3 results) Recent Labs    11/09/20 0600  BNP 146.3*   Basic Metabolic Panel: Recent Labs  Lab 11/09/20 0600 11/11/20 0543 11/12/20 0529  NA 139 138 135  K 3.8 3.6 3.9  CL  99 101 95*  CO2 31 30 32  GLUCOSE 106* 101* 121*  BUN CREATININE 0.73 0.89 0.89  CALCIUM 9.3 8.7* 9.0   Liver Function Tests: No results for input(s): AST, ALT, ALKPHOS, BILITOT, PROT, ALBUMIN in the last 168 hours. No results for input(s): LIPASE, AMYLASE in the last 168 hours. No results for input(s): AMMONIA in the last 168 hours. CBC: Recent Labs  Lab 11/09/20 0600 11/11/20 0543 11/12/20 0529  WBC 6.2 6.0 7.6  NEUTROABS 3.6  --   --   HGB 12.7 12.1 13.2  HCT 37.9 36.4 40.0  MCV 97.4 97.6 98.8  PLT 277 269 282   Cardiac Enzymes: No results for input(s): CKTOTAL, CKMB, CKMBINDEX, TROPONINI in the last 168 hours. BNP: Invalid input(s): POCBNP CBG: No results for input(s): GLUCAP in the last 168 hours. D-Dimer No results for input(s): DDIMER in the last 72 hours. Hgb A1c No results for input(s): HGBA1C in the last 72 hours. Lipid Profile Recent Labs     11/10/20 0506  CHOL 180  HDL 78  LDLCALC 85  TRIG 83  CHOLHDL 2.3   Thyroid function studies No results for input(s): TSH, T4TOTAL, T3FREE, THYROIDAB in the last 72 hours.  Invalid input(s): FREET3 Anemia work up No results for input(s): VITAMINB12, FOLATE, FERRITIN, TIBC, IRON, RETICCTPCT in the last 72 hours. Urinalysis No results found for: COLORURINE, APPEARANCEUR, LABSPEC, PHURINE, GLUCOSEU, HGBUR, BILIRUBINUR, KETONESUR, PROTEINUR, UROBILINOGEN, NITRITE, LEUKOCYTESUR Sepsis Labs Invalid input(s): PROCALCITONIN,  WBC,  LACTICIDVEN Microbiology Recent Results (from the past 240 hour(s))  Resp Panel by RT-PCR (Flu A&B, Covid) Nasopharyngeal Swab     Status: None   Collection Time: 11/09/20  6:00 AM   Specimen: Nasopharyngeal Swab; Nasopharyngeal(NP) swabs in vial transport medium  Result Value Ref Range Status   SARS Coronavirus 2 by RT PCR NEGATIVE NEGATIVE Final    Comment: (NOTE) SARS-CoV-2 target nucleic acids are NOT DETECTED.  The SARS-CoV-2 RNA is generally detectable in upper respiratory specimens during the acute phase of infection. The lowest concentration of SARS-CoV-2 viral copies this assay can detect is 138 copies/mL. A negative result does not preclude SARS-Cov-2 infection and should not be used as the sole basis for treatment or other patient management decisions. A negative result may occur with  improper specimen collection/handling, submission of specimen other than nasopharyngeal swab, presence of viral mutation(s) within the areas targeted by this assay, and inadequate number of viral copies(<138 copies/mL). A negative result must be combined with clinical observations, patient history, and epidemiological information. The expected result is Negative.  Fact Sheet for Patients:  BloggerCourse.com  Fact Sheet for Healthcare Providers:  SeriousBroker.it  This test is no t yet approved or cleared by  the [place] FDA and  has been authorized for detection and/or diagnosis of SARS-CoV-2 by FDA under an Emergency Use Authorization (EUA). This EUA will remain  in effect (meaning this test can be used) for the duration of the COVID-19 declaration under Section 564(b)(1) of the Act, 21 U.S.C.section 360bbb-3(b)(1), unless the authorization is terminated  or revoked sooner.       Influenza A by PCR NEGATIVE NEGATIVE Final   Influenza B by PCR NEGATIVE NEGATIVE Final    Comment: (NOTE) The Xpert Xpress SARS-CoV-2/FLU/RSV plus assay is intended as an aid in the diagnosis of influenza from Nasopharyngeal swab specimens and should not be used as a sole basis for treatment. Nasal washings and aspirates are unacceptable for Xpert Xpress SARS-CoV-2/FLU/RSV testing.  Fact  Sheet for Patients: BloggerCourse.com  Fact Sheet for Healthcare Providers: SeriousBroker.it  This test is not yet approved or cleared by the [place] FDA and has been authorized for detection and/or diagnosis of SARS-CoV-2 by FDA under an Emergency Use Authorization (EUA). This EUA will remain in effect (meaning this test can be used) for the duration of the COVID-19 declaration under Section 564(b)(1) of the Act, 21 U.S.C. section 360bbb-3(b)(1), unless the authorization is terminated or revoked.  Performed at [facility], [address], [place], [place] [address]      Time coordinating discharge: Over 30 minutes  SIGNED:   Charise Killian, MD  [facility] 11/12/2020, 12:47 PM Pager   If 7PM-7AM, please contact night-coverage

## 2020-11-12 NOTE — Progress Notes (Signed)
D/C paperwork given to the patient. Patient verbalized understanding. No unanswered questions.  All belongings with the patient.  Cab voucher for ride home.  Awaiting the cab for transport.

## 2021-03-16 ENCOUNTER — Other Ambulatory Visit: Payer: Self-pay

## 2021-03-16 DIAGNOSIS — N183 Chronic kidney disease, stage 3 unspecified: Secondary | ICD-10-CM | POA: Diagnosis not present

## 2021-03-16 DIAGNOSIS — I129 Hypertensive chronic kidney disease with stage 1 through stage 4 chronic kidney disease, or unspecified chronic kidney disease: Secondary | ICD-10-CM | POA: Diagnosis not present

## 2021-03-16 DIAGNOSIS — J449 Chronic obstructive pulmonary disease, unspecified: Secondary | ICD-10-CM | POA: Insufficient documentation

## 2021-03-16 DIAGNOSIS — Z7951 Long term (current) use of inhaled steroids: Secondary | ICD-10-CM | POA: Insufficient documentation

## 2021-03-16 DIAGNOSIS — F0390 Unspecified dementia without behavioral disturbance: Secondary | ICD-10-CM | POA: Insufficient documentation

## 2021-03-16 DIAGNOSIS — Z91040 Latex allergy status: Secondary | ICD-10-CM | POA: Insufficient documentation

## 2021-03-16 DIAGNOSIS — Z79899 Other long term (current) drug therapy: Secondary | ICD-10-CM | POA: Diagnosis not present

## 2021-03-16 DIAGNOSIS — Z7982 Long term (current) use of aspirin: Secondary | ICD-10-CM | POA: Diagnosis not present

## 2021-03-16 DIAGNOSIS — Z87891 Personal history of nicotine dependence: Secondary | ICD-10-CM | POA: Diagnosis not present

## 2021-03-16 NOTE — ED Triage Notes (Addendum)
Pt comes with c/o hypertension. Pt states she has two  types of meds and has just not taken them.  200/108 manually per EMS. Pt appears anxious to EMS  Pt denies any CP, SOB, dizziness, blurry vision or headache.

## 2021-03-17 ENCOUNTER — Emergency Department
Admission: EM | Admit: 2021-03-17 | Discharge: 2021-03-17 | Disposition: A | Discharge status: Home / Self Care | Payer: Medicare HMO | Attending: Emergency Medicine | Admitting: Emergency Medicine

## 2021-03-17 DIAGNOSIS — I10 Essential (primary) hypertension: Secondary | ICD-10-CM

## 2021-03-17 MED ORDER — IRBESARTAN 150 MG PO TABS
300.0000 mg | ORAL_TABLET | Freq: Once | ORAL | Status: AC
  Administered 2021-03-17: 05:00:00 300 mg via ORAL
  Filled 2021-03-17 (×2): qty 2

## 2021-03-17 MED ORDER — AMLODIPINE BESYLATE 5 MG PO TABS
10.0000 mg | ORAL_TABLET | Freq: Once | ORAL | Status: AC
  Administered 2021-03-17: 05:00:00 10 mg via ORAL
  Filled 2021-03-17: qty 2

## 2021-03-17 NOTE — Discharge Instructions (Signed)
Take your medications as prescribed.  Do not take the medicine where we wrote do not take on the bottle.  You will take the losartan/HCTZ pill for your blood pressure. I would suggest you to switch pharmacies to either [facility] in [place] or [facility] in [place] to see if they can put all of your pills in what we call blister packs so that you will not make sure pills up.  This would help you keep organized. Be sure to take all of your pills as prescribed.  Follow-up with Dr. Judithann Sheen if you continue to have elevated blood pressure.  Return emergency department if you are worsening

## 2021-03-17 NOTE — ED Notes (Signed)
See triage note presents with mild confusion  and high blood pressure  states she has been confused about her meds for the past few weeks

## 2021-03-17 NOTE — ED Provider Notes (Signed)
[facility] Emergency Department Provider Note  ____________________________________________   Event Date/Time   First MD Initiated Contact with Patient 03/17/21 0725     (approximate)  I have reviewed the triage vital signs and the nursing notes.   HISTORY  Chief Complaint Hypertension    HPI Samantha Carpenter is a 80 y.o. female presents emergency department with elevated blood pressure she has not taken her medications in 1 month.  Patient is very confused about her pills.  States that the pharmacist told her not to take both blood pressure medicines that she has and she did not know which 1 to take.  Patient has a bottle that has hydrochlorothiazide on it and 1 and the other has losartan-HCTZ.  Patient has multiple pill bottles.  Medications are all mixed together.  Past Medical History:  Diagnosis Date   Chronic kidney disease    COPD (chronic obstructive pulmonary disease) (HCC)    Dementia ([place])    Depression    Hypertension    Liver disease     Patient Active Problem List   Diagnosis Date Noted   Right rib fracture 11/09/2020   Fall at home, initial encounter 11/09/2020   COPD (chronic obstructive pulmonary disease) ([place]) 11/09/2020   Depression with anxiety 11/09/2020   Elevated troponin 11/09/2020   Acute respiratory failure with hypoxia ([place]) 11/09/2020   HTN (hypertension) 11/09/2020   Abrasions of multiple sites    Pure hypercholesterolemia 06/09/2018   Recurrent major depressive disorder, 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   HTN, goal below 140/80 04/28/2018   Dementia, senile, without behavioral disturbance ([place]) 04/28/2018   CKD (chronic kidney disease) stage 3, GFR 30-59 ml/min ([place]) 04/28/2018    Past Surgical History:  Procedure Laterality Date   ABDOMINAL HYSTERECTOMY     CHOLECYSTECTOMY     ESOPHAGOGASTRODUODENOSCOPY N/A 11/20/2019   Procedure: ESOPHAGOGASTRODUODENOSCOPY (EGD);   Surgeon: Lesly Rubenstein, MD;  Location: [facility] ENDOSCOPY;  Service: Gastroenterology;  Laterality: N/A;   face tuck      Prior to Admission medications   Medication Sig Start Date End Date Taking? Authorizing Provider  bisoprolol (ZEBETA) 5 MG tablet Take 5 mg by mouth 2 (two) times daily. 02/20/21 02/20/22 Yes [provider]  albuterol (VENTOLIN HFA) 108 (90 Base) MCG/ACT inhaler Inhale 2 puffs into the lungs every 6 (six) hours as needed for wheezing or shortness of breath. 05/05/20   [provider]  amLODipine (NORVASC) 10 MG tablet Take 1 tablet (10 mg total) by mouth daily. 08/27/20 09/26/20  Carrie Mew, MD  aspirin EC 81 MG tablet Take 81 mg by mouth daily.    [provider]  buPROPion (WELLBUTRIN XL) 150 MG 24 hr tablet Take 150 mg by mouth daily. 10/27/19   [provider]  clonazePAM (KLONOPIN) 1 MG tablet Take 1 mg by mouth 2 (two) times daily. 05/08/18   [provider]  DULoxetine (CYMBALTA) 60 MG capsule Take 60 mg by mouth daily. 08/25/18   [provider]  fluticasone-salmeterol (ADVAIR HFA) 115-21 MCG/ACT inhaler Inhale 2 puffs into the lungs every 12 (twelve) hours. 01/07/19 01/07/20  [provider]  gabapentin (NEURONTIN) 100 MG capsule Take 100 mg by mouth 2 (two) times daily. 06/18/18   [provider]  hydrochlorothiazide (HYDRODIURIL) 12.5 MG tablet Take 12.5 mg by mouth daily. Patient not taking: Reported on 03/17/2021 01/15/21   [provider]  ipratropium-albuterol (DUONEB) 0.5-2.5 (3) MG/3ML SOLN Inhale 3 mLs  into the lungs in the morning, at noon, in the evening, and at bedtime. 05/05/20 04/30/21  [provider]  irbesartan (AVAPRO) 300 MG tablet Take 300 mg by mouth daily.  04/28/18 11/09/20  [provider]  losartan-hydrochlorothiazide (HYZAAR) 100-12.5 MG tablet Take 1 tablet by mouth daily. 02/20/21   [provider]  memantine (NAMENDA) 10 MG tablet Take 10  mg by mouth 2 (two) times daily.  06/18/18   [provider]  OXcarbazepine (TRILEPTAL) 150 MG tablet 1 po qam qnd 2 po qhs 06/12/18   [provider]  rivastigmine (EXELON) 1.5 MG capsule Take 1.5 mg by mouth 2 (two) times daily. 03/28/20 03/28/21  [provider]    Allergies Morphine and related and Latex  Family History  Problem Relation Age of Onset   Bipolar disorder Brother    Schizophrenia Brother    Bipolar disorder Sister    Schizophrenia Sister    Drug abuse Son     Social History Social History   Tobacco Use   Smoking status: Former    Types: Cigarettes    Quit date: 09/01/2009    Years since quitting: 11.5   Smokeless tobacco: Never  Vaping Use   Vaping Use: Never used  Substance Use Topics   Alcohol use: Not Currently   Drug use: Not Currently    Review of Systems  Constitutional: No fever/chills Eyes: No visual changes. ENT: No sore throat. Respiratory: Denies cough Cardiovascular: Denies chest pain Gastrointestinal: Denies abdominal pain Genitourinary: Negative for dysuria. Musculoskeletal: Negative for back pain. Skin: Negative for rash. Psychiatric: no mood changes,     ____________________________________________   PHYSICAL EXAM:  VITAL SIGNS: ED Triage Vitals  Enc Vitals Group     BP 03/16/21 1749 (!) 196/154     Pulse Rate 03/16/21 1749 (!) 57     Resp 03/16/21 1749 14     Temp 03/16/21 1749 98.3 F (36.8 C)     Temp Source 03/16/21 1749 Oral     SpO2 03/16/21 1749 95 %     Weight 03/17/21 0721 138 lb 14.2 oz (63 kg)     Height 03/17/21 0721 5\' 2"  (1.575 m)     Head Circumference --      Peak Flow --      Pain Score 03/16/21 1747 0     Pain Loc --      Pain Edu? --      Excl. in GC? --     Constitutional: Alert and oriented. Well appearing and in no acute distress. Eyes: Conjunctivae are normal.  Head: Atraumatic. Nose: No congestion/rhinnorhea. Mouth/Throat: Mucous membranes are moist.   Neck:   supple no lymphadenopathy noted Cardiovascular: Normal rate, regular rhythm. Heart sounds are normal Respiratory: Normal respiratory effort.  No retractions, lungs c t a  GU: deferred Musculoskeletal: FROM all extremities, warm and well perfused Neurologic:  Normal speech and language.  Skin:  Skin is warm, dry and intact. No rash noted. Psychiatric: Mood and affect are normal. Speech and behavior are normal.  ____________________________________________   LABS (all labs ordered are listed, but only abnormal results are displayed)  Labs Reviewed - No data to display  ____________________________________________   ____________________________________________  RADIOLOGY    ____________________________________________   PROCEDURES  Procedure(s) performed: No  Procedures    ____________________________________________   INITIAL IMPRESSION / ASSESSMENT AND PLAN / ED COURSE  Pertinent labs & imaging results that were available during my care of the patient were reviewed by  me and considered in my medical decision making (see chart for details).    The patient is an 80 year old female with history of hypertension, chronic kidney disease, depression, dementia and COPD.  She presents with elevated blood pressure.  She is unsure of which medication she is supposed to take.  See HPI.  Physical exam shows patient appears well.  She was given medication this morning in triage, blood pressure on arrival was 196/154, blood pressure is now 145/111.  Spoke with the patient's sister.  She will be coming to the ED to pick the patient up.  I feel that this is a chronic problem and patient does not need further work-up.  We will have the pharmacy sort her pills for her.  I did have a discussion with her sister that she should change pharmacies to a compounding pharmacy where they could put her medication in blister packs to make it easier for her.  That way she cannot mix up all of the  bottles.  This may need to be done with Dr. Doy Hutching as he can change her medications over to a different pharmacy.    Our pharmacist verified medications.  The 2 different color and size pills are the same medication but from different manufacturers.  I did explain this to the patient.  This was also explained to her sister.  The patient was discharged in stable condition as her blood pressure had improved.  She is to follow-up with Dr. Doy Hutching. She was also instructed not to use the hydrochlorothiazide but to use the losartan/HCTZ.  Ah Trost was evaluated in Emergency Department on 03/17/2021 for the symptoms described in the history of present illness. She was evaluated in the context of the global COVID-19 pandemic, which necessitated consideration that the patient might be at risk for infection with the SARS-CoV-2 virus that causes COVID-19. Institutional protocols and algorithms that pertain to the evaluation of patients at risk for COVID-19 are in a state of rapid change based on information released by regulatory bodies including the CDC and federal and state organizations. These policies and algorithms were followed during the patient's care in the ED.    As part of my medical decision making, I reviewed the following data within the [place] History obtained from family, Nursing notes reviewed and incorporated, Old chart reviewed, Notes from prior ED visits, and [place] Controlled Substance Database  ____________________________________________   FINAL CLINICAL IMPRESSION(S) / ED DIAGNOSES  Final diagnoses:  Primary hypertension      NEW MEDICATIONS STARTED DURING THIS VISIT:  Discharge Medication List as of 03/17/2021  9:59 AM       Note:  This document was prepared using Dragon voice recognition software and may include unintentional dictation errors.    Versie Starks, PA-C 03/17/21 1248    Harvest Dark, MD 03/17/21 1504

## 2021-03-17 NOTE — ED Notes (Signed)
Pt discharged with sister, instructions reviewed with both patient and family.

## 2021-09-18 ENCOUNTER — Other Ambulatory Visit: Payer: Self-pay

## 2021-09-18 ENCOUNTER — Emergency Department
Admission: EM | Admit: 2021-09-18 | Discharge: 2021-09-18 | Disposition: A | Discharge status: Skilled Nursing Facility | Payer: Medicare HMO | Attending: Emergency Medicine | Admitting: Emergency Medicine

## 2021-09-18 ENCOUNTER — Emergency Department: Payer: Medicare HMO

## 2021-09-18 DIAGNOSIS — Y92129 Unspecified place in nursing home as the place of occurrence of the external cause: Secondary | ICD-10-CM | POA: Diagnosis not present

## 2021-09-18 DIAGNOSIS — R000 Tachycardia, unspecified: Secondary | ICD-10-CM | POA: Diagnosis not present

## 2021-09-18 DIAGNOSIS — W19XXXA Unspecified fall, initial encounter: Secondary | ICD-10-CM | POA: Diagnosis not present

## 2021-09-18 DIAGNOSIS — R1032 Left lower quadrant pain: Secondary | ICD-10-CM | POA: Diagnosis not present

## 2021-09-18 DIAGNOSIS — R42 Dizziness and giddiness: Secondary | ICD-10-CM | POA: Insufficient documentation

## 2021-09-18 LAB — BASIC METABOLIC PANEL
Anion gap: 9 (ref 5–15)
Anion gap: 8 (ref 5–15)
BUN: 24 mg/dL — ABNORMAL HIGH (ref 8–23)
BUN: 27 mg/dL — ABNORMAL HIGH (ref 8–23)
CO2: 33 mmol/L — ABNORMAL HIGH (ref 22–32)
CO2: 32 mmol/L (ref 22–32)
Calcium: 9.6 mg/dL (ref 8.9–10.3)
Calcium: 9.5 mg/dL (ref 8.9–10.3)
Chloride: 95 mmol/L — ABNORMAL LOW (ref 98–111)
Chloride: 96 mmol/L — ABNORMAL LOW (ref 98–111)
Creatinine, Ser: 1.08 mg/dL — ABNORMAL HIGH (ref 0.44–1.00)
Creatinine, Ser: 1.07 mg/dL — ABNORMAL HIGH (ref 0.44–1.00)
GFR, Estimated: 51 mL/min — ABNORMAL LOW (ref >60)
GFR, Estimated: 52 mL/min — ABNORMAL LOW (ref >60)
Glucose, Bld: 144 mg/dL — ABNORMAL HIGH (ref 70–99)
Glucose, Bld: 125 mg/dL — ABNORMAL HIGH (ref 70–99)
Potassium: 4.1 mmol/L (ref 3.5–5.1)
Potassium: 4.1 mmol/L (ref 3.5–5.1)
Sodium: 137 mmol/L (ref 135–145)
Sodium: 136 mmol/L (ref 135–145)

## 2021-09-18 LAB — CBC
HCT: 41 % (ref 36.0–46.0)
Hemoglobin: 13.3 g/dL (ref 12.0–15.0)
MCH: 31.1 pg (ref 26.0–34.0)
MCHC: 32.4 g/dL (ref 30.0–36.0)
MCV: 96 fL (ref 80.0–100.0)
Platelets: 248 10*3/uL (ref 150–400)
RBC: 4.27 MIL/uL (ref 3.87–5.11)
RDW: 12.7 % (ref 11.5–15.5)
WBC: 5.1 10*3/uL (ref 4.0–10.5)
nRBC: 0 % (ref 0.0–0.2)

## 2021-09-18 LAB — TROPONIN I (HIGH SENSITIVITY)
Troponin I (High Sensitivity): 10 ng/L (ref <18)
Troponin I (High Sensitivity): 9 ng/L (ref <18)

## 2021-09-18 MED ORDER — IOHEXOL 300 MG/ML  SOLN
100.0000 mL | Freq: Once | INTRAMUSCULAR | Status: AC | PRN
  Administered 2021-09-18: 100 mL via INTRAVENOUS

## 2021-09-18 MED ORDER — SODIUM CHLORIDE 0.9 % IV BOLUS
1000.0000 mL | Freq: Once | INTRAVENOUS | Status: AC
  Administered 2021-09-18: 1000 mL via INTRAVENOUS

## 2021-09-18 NOTE — ED Provider Triage Note (Signed)
Emergency Medicine Provider Triage Evaluation Note  Tasheema Perrone , a 81 y.o. female  was evaluated in triage.  Pt complains of fall. Reports that she was at [facility] and turned and slipped on the tile. She reports that she hit her head and she has a headache. No LOC. Nausea without vomiting. No blood thinners  Review of Systems  Positive: Headache, nausea Negative: vomiting  Physical Exam  There were no vitals taken for this visit. Gen:   Awake, no distress   Resp:  Normal effort  MSK:   Moves extremities without difficulty  Other:    Medical Decision Making  Medically screening exam initiated at 2:15 PM.  Appropriate orders placed.  Briauna Gilmartin was informed that the remainder of the evaluation will be completed by another provider, this initial triage assessment does not replace that evaluation, and the importance of remaining in the ED until their evaluation is complete.     Jackelyn Hoehn, PA-C 09/18/21 1417

## 2021-09-18 NOTE — ED Notes (Signed)
Patient transported to CT 

## 2021-09-18 NOTE — ED Notes (Signed)
Called EMS for transport to the [facility]

## 2021-09-18 NOTE — Discharge Instructions (Signed)
Please return for any further problems.  Head CT and other evaluation was negative.

## 2021-09-18 NOTE — ED Triage Notes (Signed)
Pt comes via EMs from [place] [facility] [address] with c/o unwitnessed fall. Pt states temporal and headache. Pt does have dementia. Pt did hit head per facility. No thinners and no loc. VSS per EMs

## 2021-09-18 NOTE — ED Provider Notes (Signed)
[facility] Provider Note    Event Date/Time   First MD Initiated Contact with Patient 09/18/21 1732     (approximate)   History   Fall   HPI  Samantha Carpenter is a 81 y.o. female patient comes from nursing home with unwitnessed fall.  Patient says she did not lose her balance or get lightheaded or anything she just tripped.  She does not have any pain from the fall but she says her left lower quadrant of her abdomen is hurting.  Is been hurting for couple weeks.  Additionally she is bradycardic.  This is worse than it has been although most of the time when she is in the room her heart rates in the low 50s.  I will check a troponin and I will CT her belly since she says the pain is severe in her left lower quadrant.  Additionally she has a drop in her GFR from the last 10 GFR's that have been measured so I will give her a liter of fluid and repeat the med B and see if this helps.      Physical Exam   Triage Vital Signs: ED Triage Vitals  Enc Vitals Group     BP 09/18/21 1423 (!) 138/49     Pulse Rate 09/18/21 1423 (!) 45     Resp 09/18/21 1423 20     Temp 09/18/21 1423 97.8 F (36.6 C)     Temp Source 09/18/21 1423 Oral     SpO2 09/18/21 1423 93 %     Weight --      Height --      Head Circumference --      Peak Flow --      Pain Score 09/18/21 1411 4     Pain Loc --      Pain Edu? --      Excl. in GC? --     Most recent vital signs:  General: Awake, no distress.  Head normocephalic atraumatic Eyes pupils equal round extraocular movements intact Neck is supple and nontender CV:  Good peripheral perfusion.  No audible murmurs bradycardia though Resp:  Normal effort.  Lungs are clear Abd:  No distention.  Patient complains of left lower quadrant pain on exam Extremities moving well.   ED Results / Procedures / Treatments   Labs (all labs ordered are listed, but only abnormal results are displayed) Labs Reviewed  BASIC METABOLIC PANEL -  Abnormal; Notable for the following components:      Result Value   Chloride 95 (*)    CO2 33 (*)    Glucose, Bld 144 (*)    BUN 24 (*)    Creatinine, Ser 1.08 (*)    GFR, Estimated 51 (*)    All other components within normal limits  BASIC METABOLIC PANEL - Abnormal; Notable for the following components:   Chloride 96 (*)    Glucose, Bld 125 (*)    BUN 27 (*)    Creatinine, Ser 1.07 (*)    GFR, Estimated 52 (*)    All other components within normal limits  CBC  URINALYSIS, ROUTINE W REFLEX MICROSCOPIC  CBG MONITORING, ED  TROPONIN I (HIGH SENSITIVITY)  TROPONIN I (HIGH SENSITIVITY)  TROPONIN I (HIGH SENSITIVITY)     EKG  EKG read interpreted by me shows sinus bradycardia rate of 45 normal axis no acute changes   RADIOLOGY CT of the head and neck read by radiology reviewed and interpreted again  by me showed no acute disease there is some DJD and foraminal narrowing in the neck.   PROCEDURES:  Critical Care performed:   Procedures   MEDICATIONS ORDERED IN ED: Medications  sodium chloride 0.9 % bolus 1,000 mL (1,000 mLs Intravenous New Bag/Given 09/18/21 1759)  iohexol (OMNIPAQUE) 300 MG/ML solution 100 mL (100 mLs Intravenous Contrast Given 09/18/21 1811)     IMPRESSION / MDM / ASSESSMENT AND PLAN / ED COURSE  I reviewed the triage vital signs and the nursing notes. Patient's heart rate has gone up to 57 now and is staying there.  Possibly she was vasovagal after the fall.  Troponins are negative belly CT is negative everything else looks good.  Patient has been awake and alert the whole time she seems to be acting normally.  I will let her go.  There is no sign of any intra-abdominal process to account for her abdominal pain or heart problems to account for the bradycardia.   Patient's presentation is most consistent with acute complicated illness / injury requiring diagnostic workup. The patient is on the cardiac monitor to evaluate for evidence of arrhythmia and/or  significant heart rate changes.  Only bradycardia was seen.      FINAL CLINICAL IMPRESSION(S) / ED DIAGNOSES   Final diagnoses:  Fall, initial encounter  Also episode of bradycardia and left lower quadrant abdominal pain  Rx / DC Orders   ED Discharge Orders     None        Note:  This document was prepared using Dragon voice recognition software and may include unintentional dictation errors.   Arnaldo Natal, MD 09/18/21 [phone]

## 2021-10-09 IMAGING — CT CT HEAD W/O CM
4 series · 17 of 47 positions shown, 19 images · non-contrast
Comparison: Brain MRI 09/01/2018.  Head CT 11/08/2020.

CLINICAL DATA: 79-year-old female status post fall out of chair.

EXAM:
CT HEAD WITHOUT CONTRAST
TECHNIQUE: Contiguous axial images were obtained from the base of the skull
through the vertex without intravenous contrast.

[Series 2: head bone · axial · 0.44mm/px · z∈[-172,-110]mm · 4 of 88 slices shown]
[im 9/88  bone]
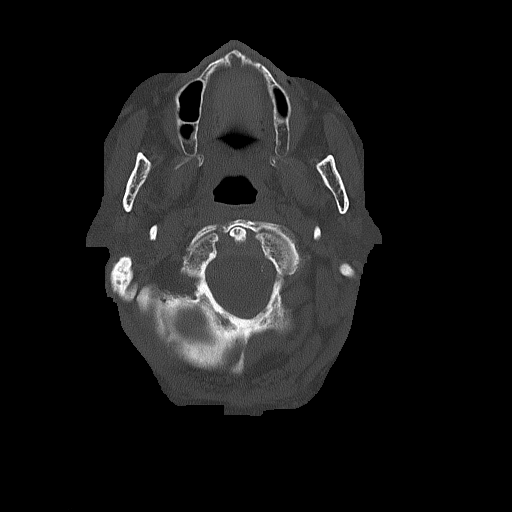
[im 18/88  bone]
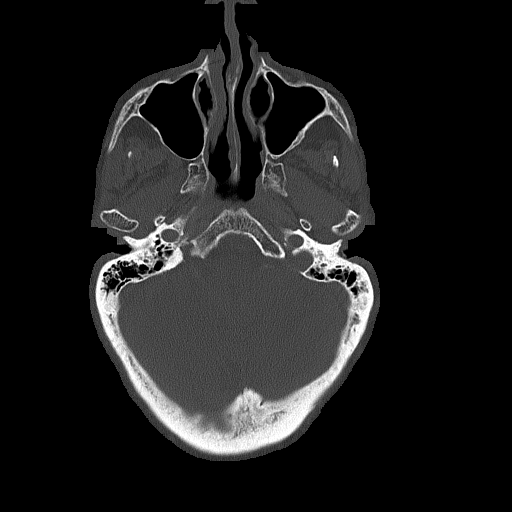
[im 27/88  bone]
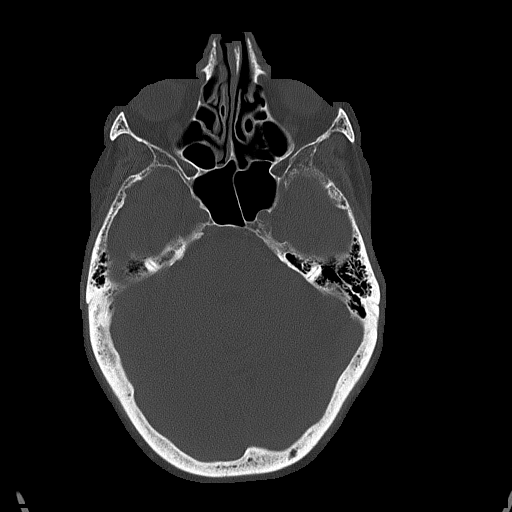
[im 40/88  bone]
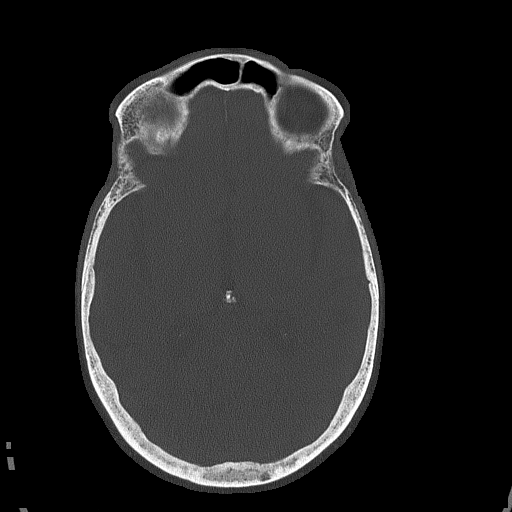

[Series 3: head wo · axial · 0.44mm/px · z∈[-168,-43]mm · 7 of 35 slices shown, 9 images]
[im 5/35  brain]
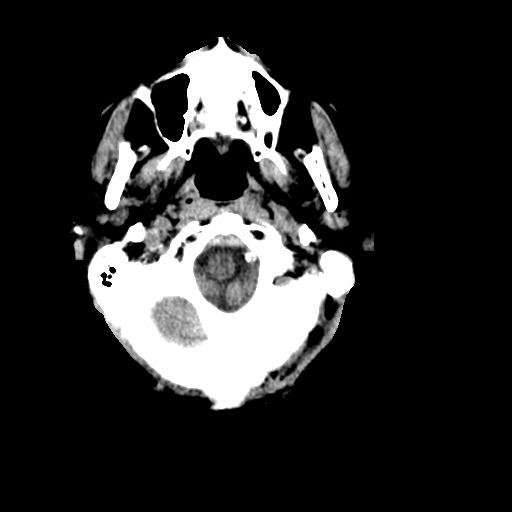
[im 5/35  bone]
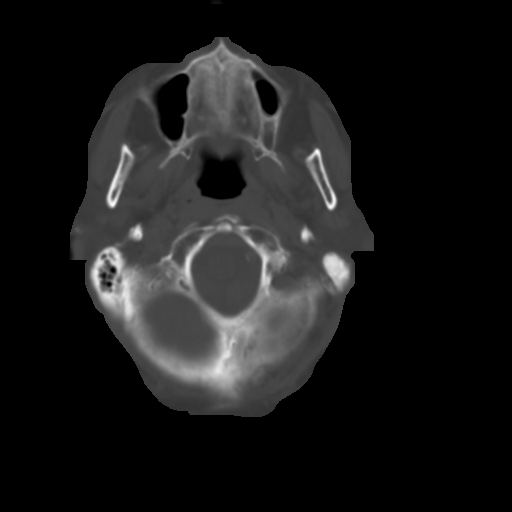
[im 9/35  brain]
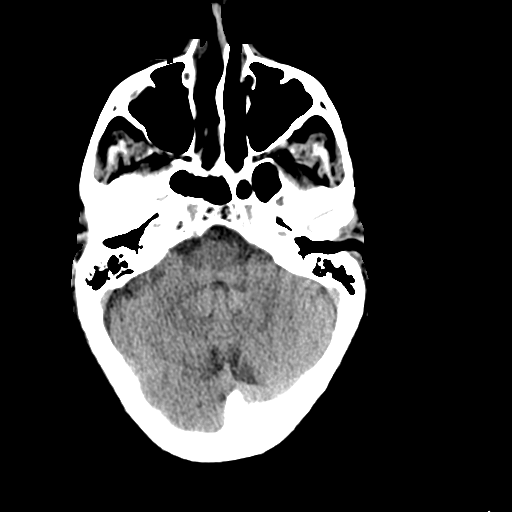
[im 13/35  brain]
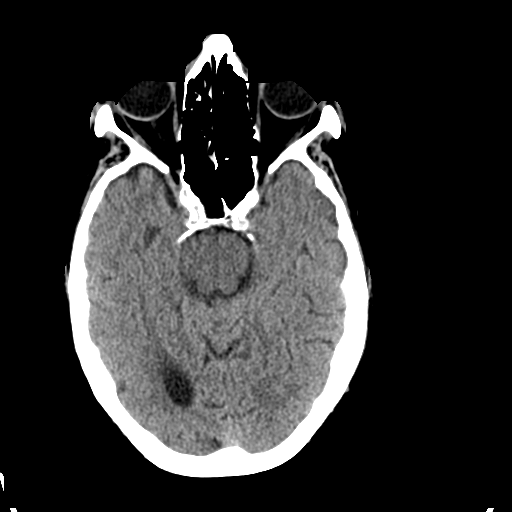
[im 18/35  brain]
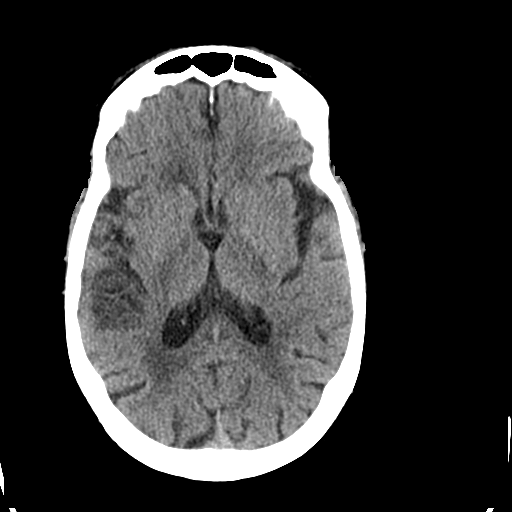
[im 22/35  brain]
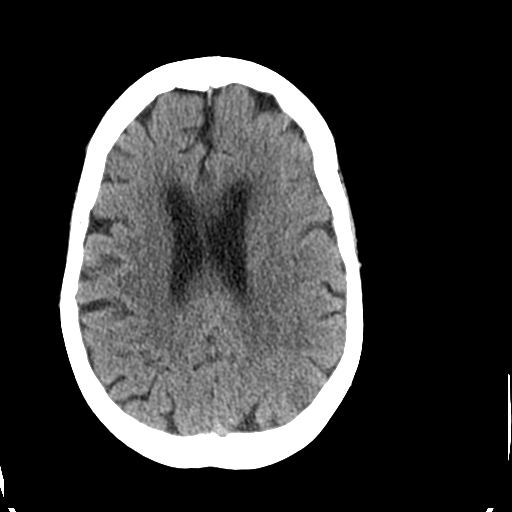
[im 22/35  bone]
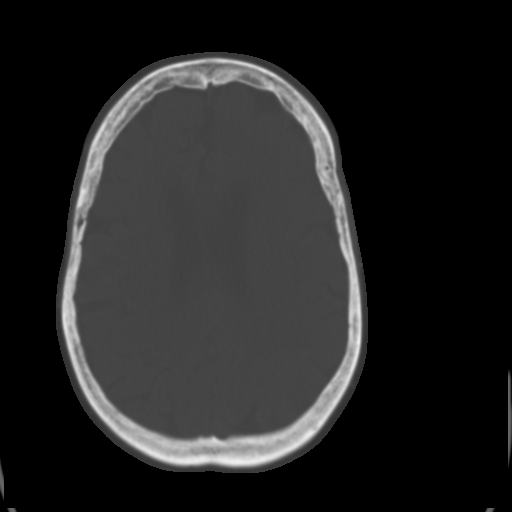
[im 26/35  brain]
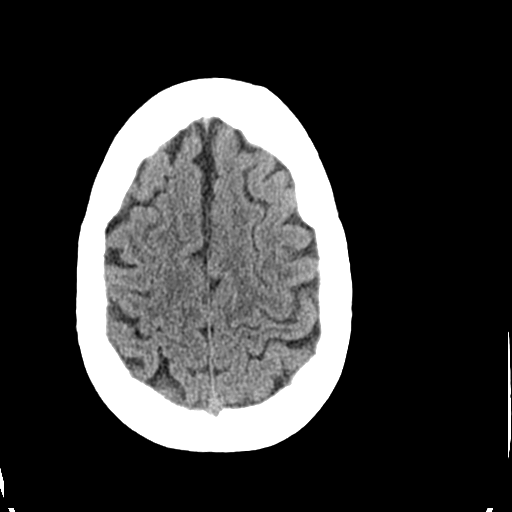
[im 30/35  brain]
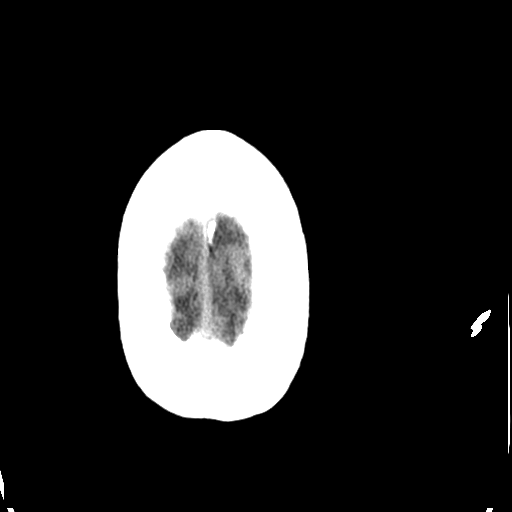

[Series 4: coronal soft tissue · coronal · 0.32mm/px · 3 of 71 slices shown]
[im 24/71  brain]
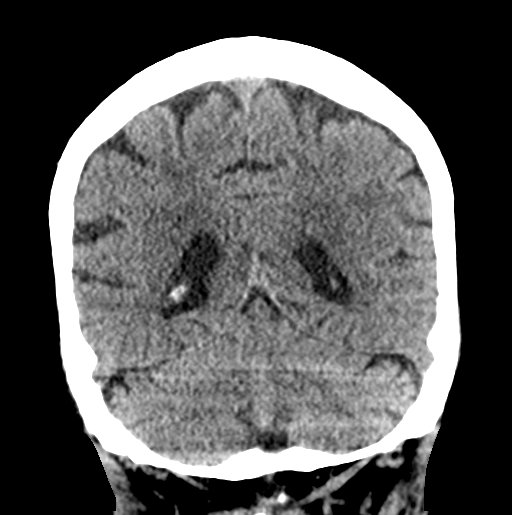
[im 32/71  brain]
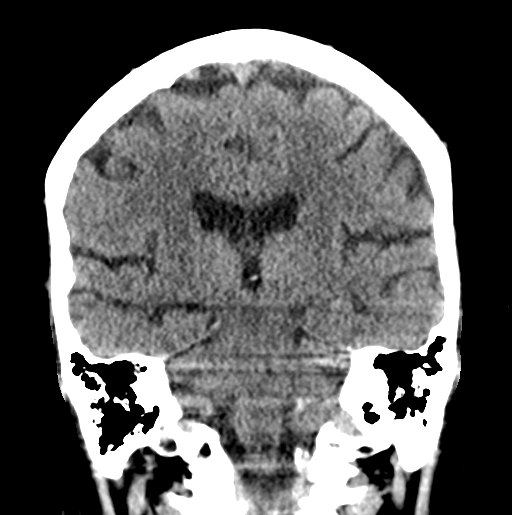
[im 39/71  brain]
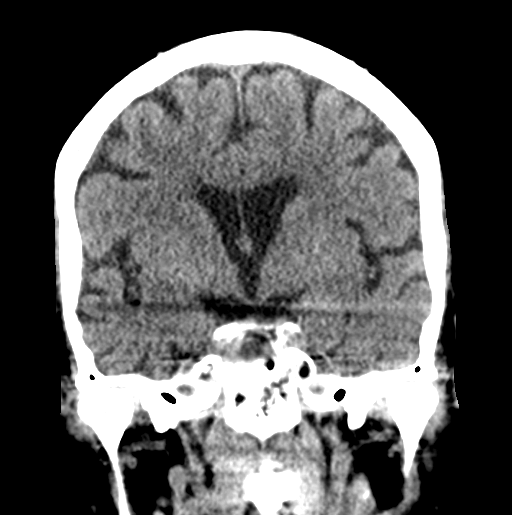

[Series 5: sagittal soft tissue · sagittal · 0.35mm/px · 3 of 59 slices shown]
[im 20/59  brain]
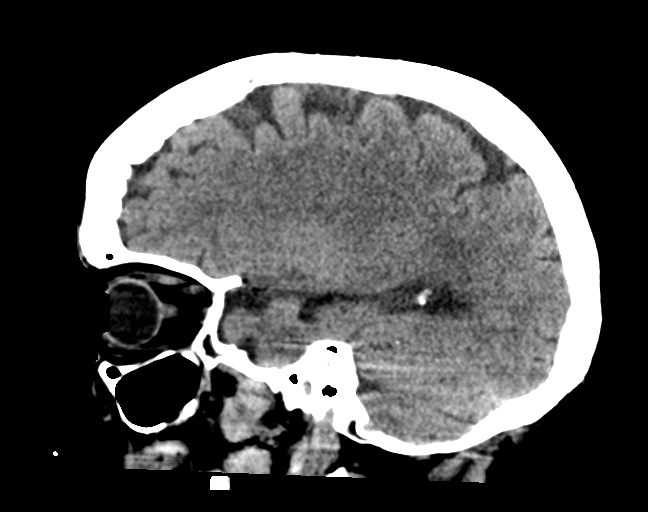
[im 30/59  brain]
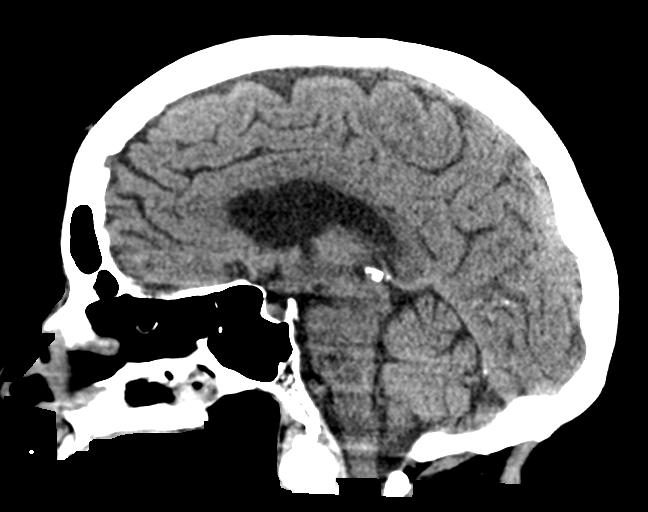
[im 39/59  brain]
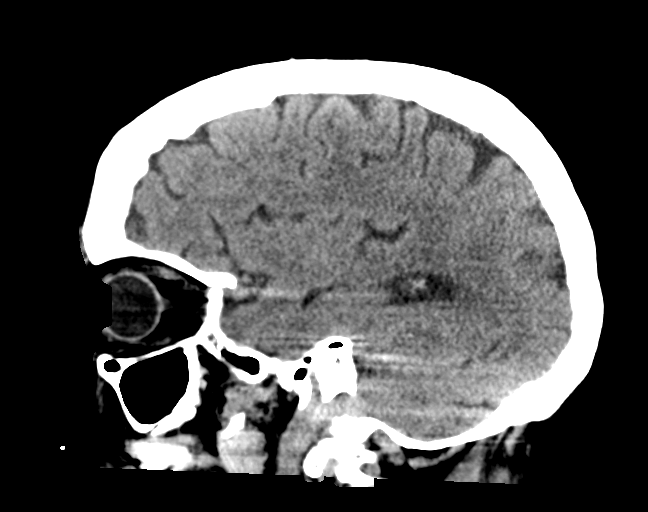

[17 of 47 positions shown; findings below may reference images not displayed]

FINDINGS: Brain: No midline shift, ventriculomegaly, mass effect, evidence of
mass lesion, intracranial hemorrhage or evidence of cortically based
acute infarction. Stable patchy and confluent bilateral cerebral
white matter hypodensity since yesterday.

Vascular: Calcified atherosclerosis at the skull base. No suspicious
intracranial vascular hyperdensity.

Skull: Stable comment intact.

Sinuses/Orbits: Visualized paranasal sinuses and mastoids are stable
and well aerated.

Other: No orbit or scalp soft tissue injury identified.
IMPRESSION: Stable. No acute intracranial abnormality or acute traumatic injury
identified.

## 2021-10-09 IMAGING — CT CT CERVICAL SPINE W/O CM
4 of 5 series · 15 of 33 positions shown, 17 images · non-contrast
Comparison: CT face and cervical spine 11/08/2020.

CLINICAL DATA: 79-year-old female status post fall out of chair.

EXAM:
CT CERVICAL SPINE WITHOUT CONTRAST
TECHNIQUE: Multidetector CT imaging of the cervical spine was performed without
intravenous contrast. Multiplanar CT image reconstructions were also
generated.

[Series 3: c spine soft · axial · 0.33mm/px · z∈[-292,-262]mm · 2 of 92 slices shown]
[im 16/92  soft-tissue]
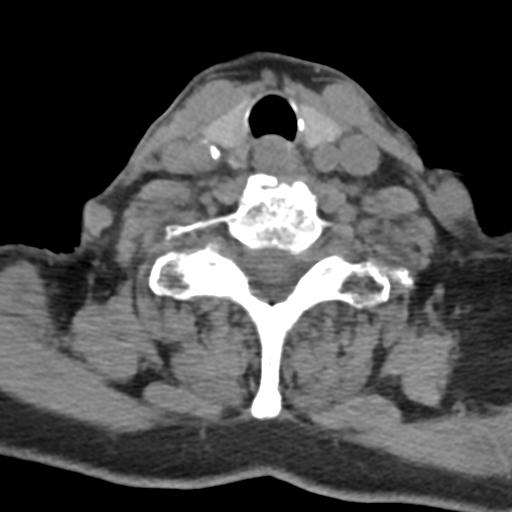
[im 31/92  soft-tissue]
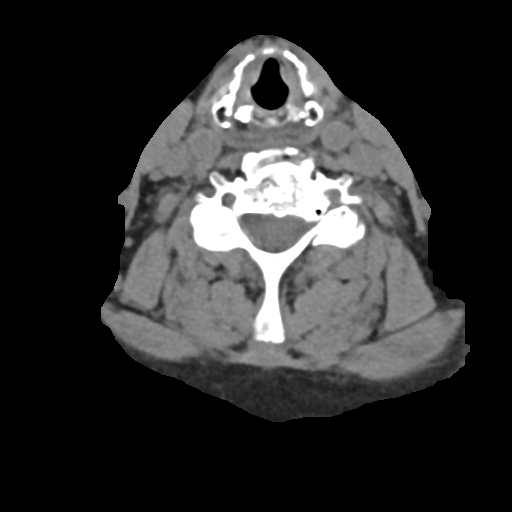

[Series 4: sagittal bone · sagittal · 0.27mm/px · 5 of 64 slices shown]
[im 11/64  bone]
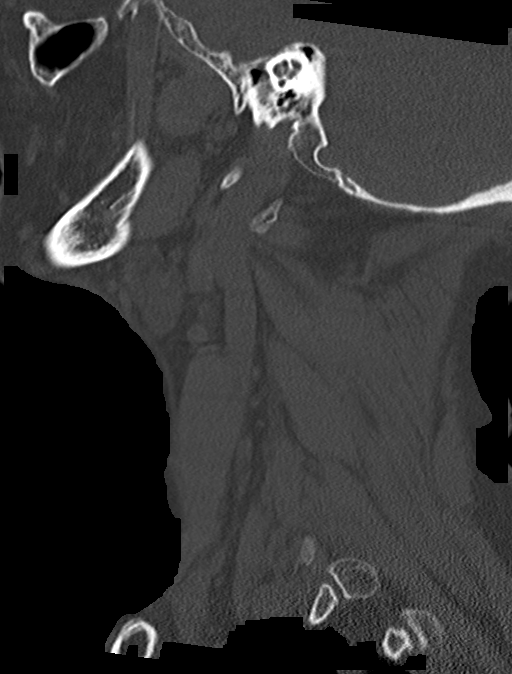
[im 22/64  bone]
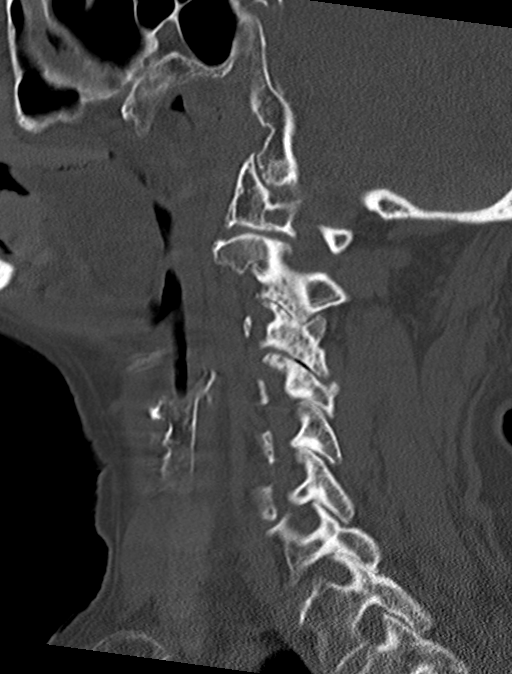
[im 32/64  bone]
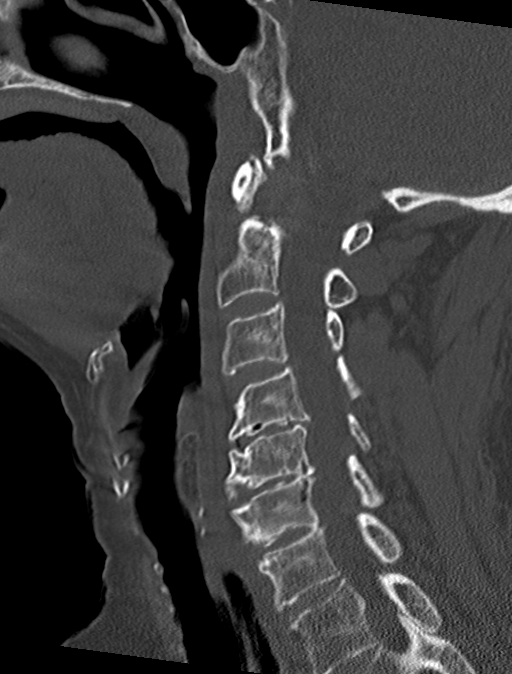
[im 43/64  bone]
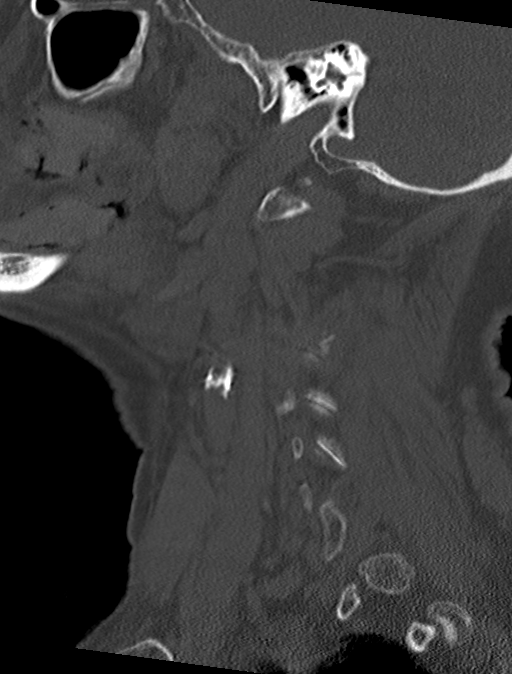
[im 53/64  bone]
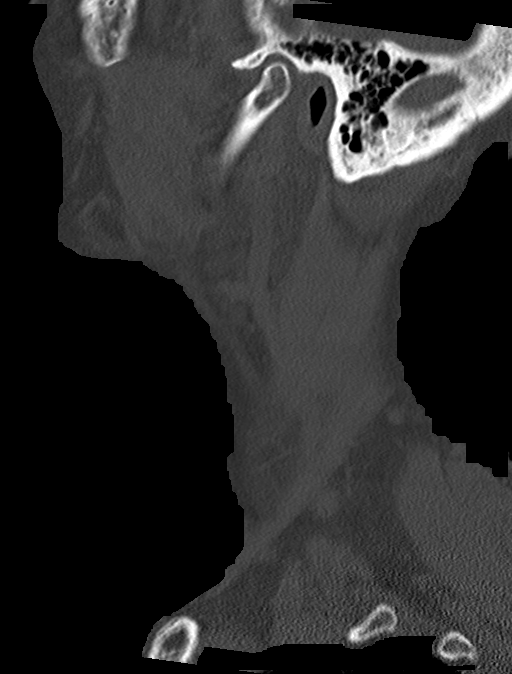

[Series 5: coronal bone · coronal · 0.27mm/px · 3 of 71 slices shown]
[im 15/71  bone]
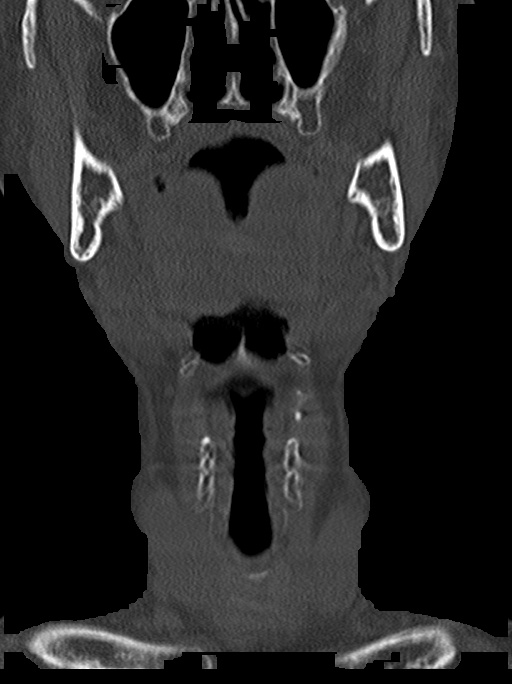
[im 29/71  bone]
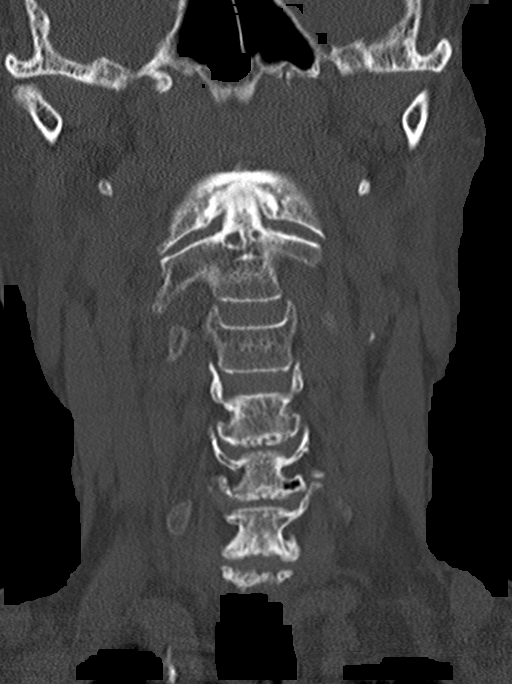
[im 43/71  bone]
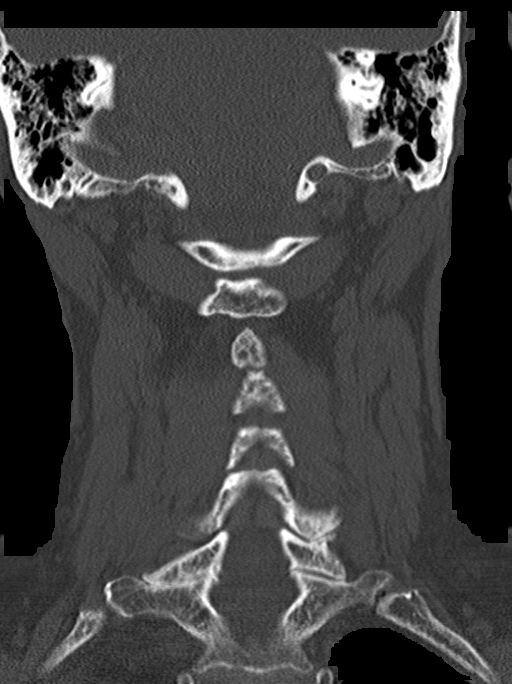

[Series 6: orthogonal bone · axial · 0.27mm/px · z∈[-302,-184]mm · 5 of 90 slices shown, 7 images]
[im 15/90  soft-tissue]
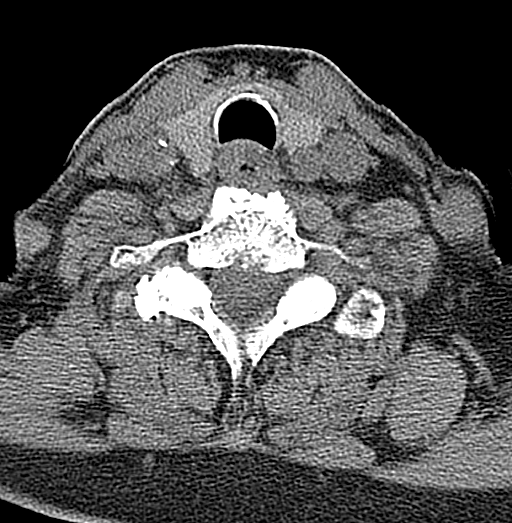
[im 15/90  bone]
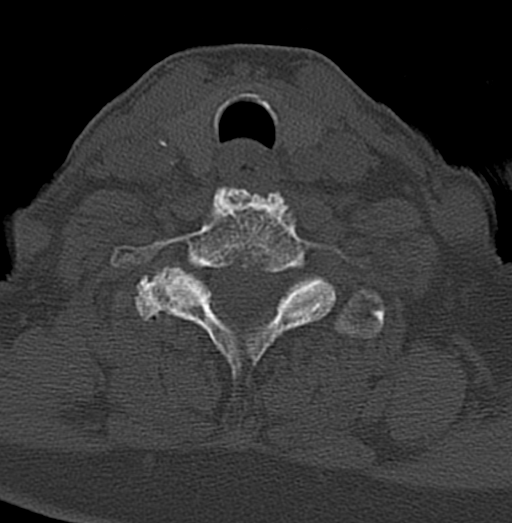
[im 30/90  bone]
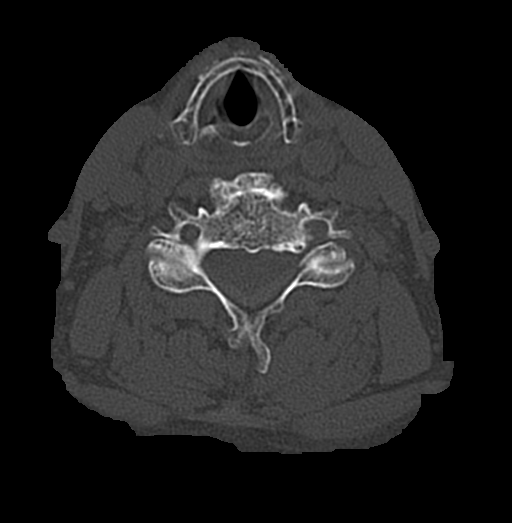
[im 45/90  bone]
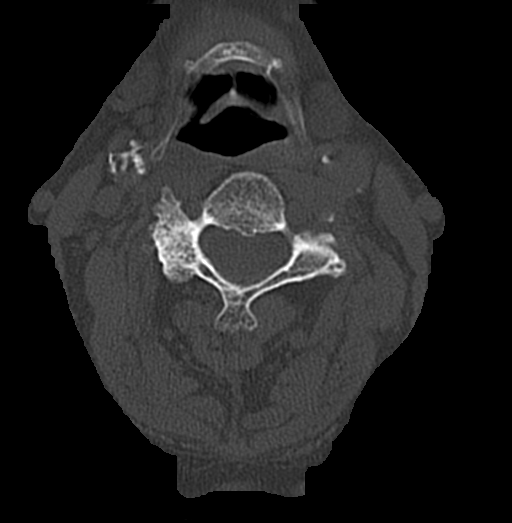
[im 60/90  bone]
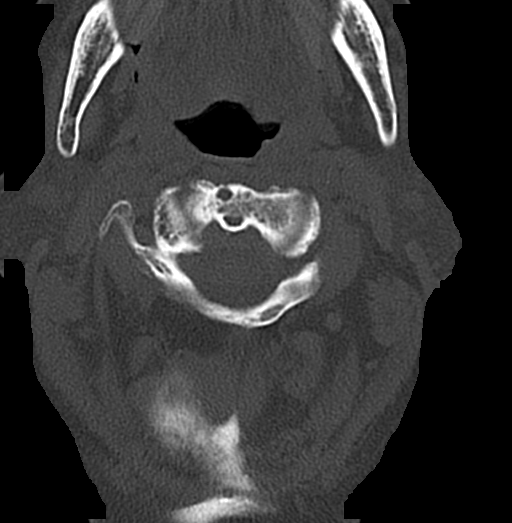
[im 75/90  soft-tissue]
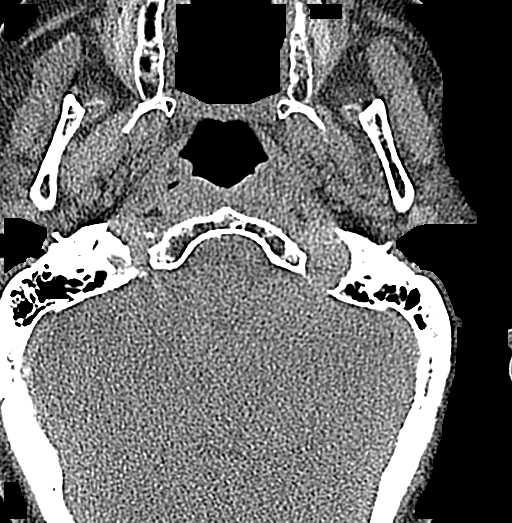
[im 75/90  bone]
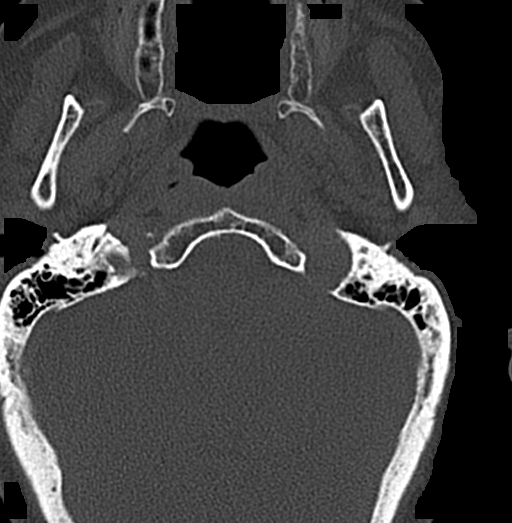

[15 of 33 positions shown; findings below may reference images not displayed]

FINDINGS: Alignment: Stable. Mild straightening of cervical lordosis with mild
anterolisthesis of C3 on C4. Subtle retrolisthesis of C4 on C5 and
C5 on C6. Bilateral posterior element alignment is within normal
limits.

Skull base and vertebrae: Visualized skull base is intact. No
atlanto-occipital dissociation. C1 and C2 appear intact and aligned.
No acute osseous abnormality identified.

Soft tissues and spinal canal: No prevertebral fluid or swelling. No
visible canal hematoma. Dominant left vertebral artery. Bulky
calcified carotid atherosclerosis in the bilateral neck.

Disc levels: Stable cervical spine degeneration, including severe
facet arthropathy on the right at C3-C4 with vacuum facet there.

Upper chest: The T1 level appear stable and intact. Chest CT is
reported separately.
IMPRESSION: 1. No acute traumatic injury identified in the cervical spine.
2. Bulky calcified carotid atherosclerosis in the neck.

## 2021-10-09 IMAGING — CT CT CHEST W/O CM
2 of 3 series · 15 of 36 positions shown, 18 images · non-contrast
Comparison: Chest radiographs 05/28/2019.

CLINICAL DATA: 79-year-old female status post fall out of chair.
Right breast pain, rib fracture suspected.

EXAM:
CT CHEST WITHOUT CONTRAST
TECHNIQUE: Multidetector CT imaging of the chest was performed following the
standard protocol without IV contrast.

[Series 2: thorax · axial · 0.64mm/px · z∈[-597,-311]mm · 12 of 169 slices shown, 15 images]
[im 13/169  mediastinal]
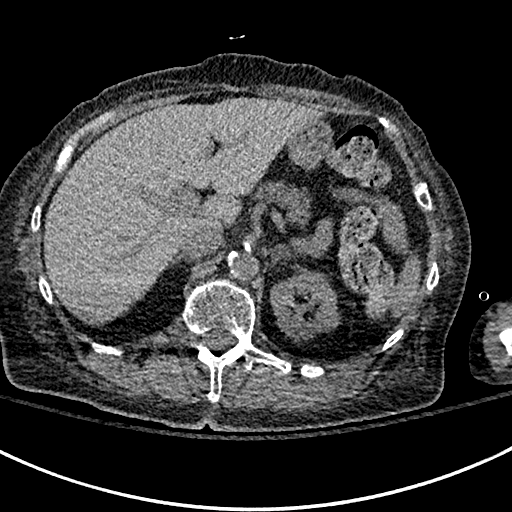
[im 13/169  lung]
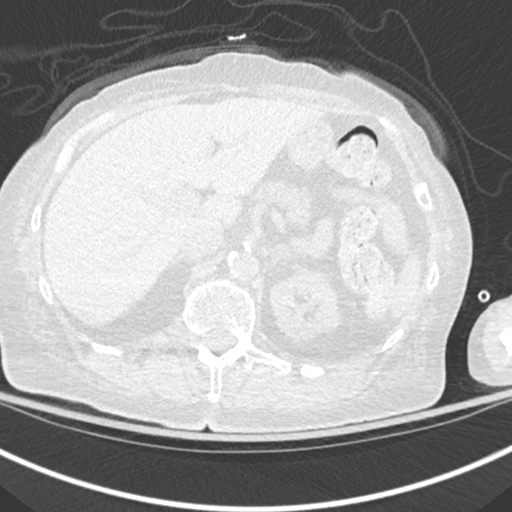
[im 25/169  lung]
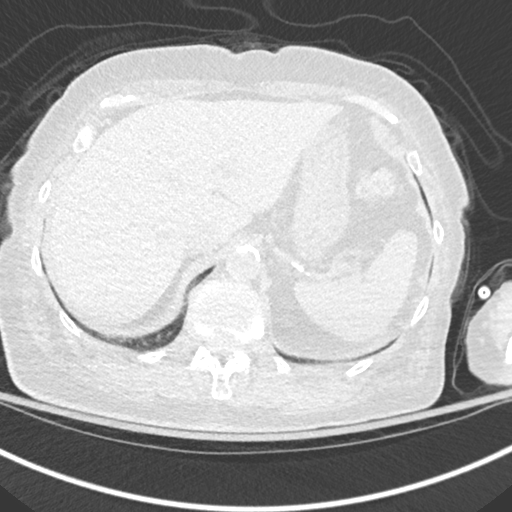
[im 38/169  lung]
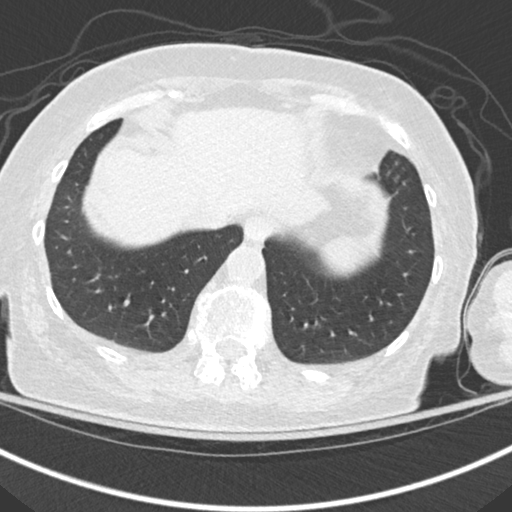
[im 50/169  lung]
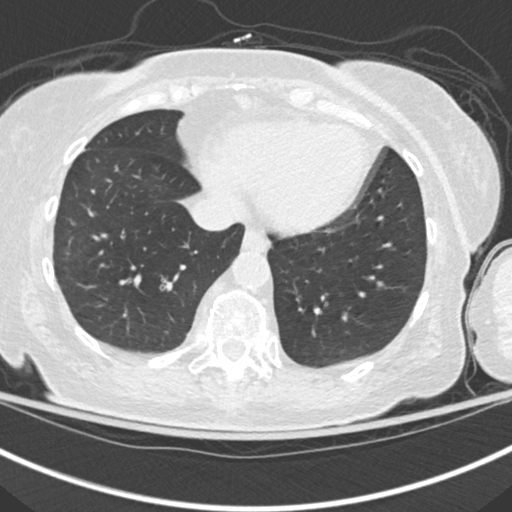
[im 63/169  mediastinal]
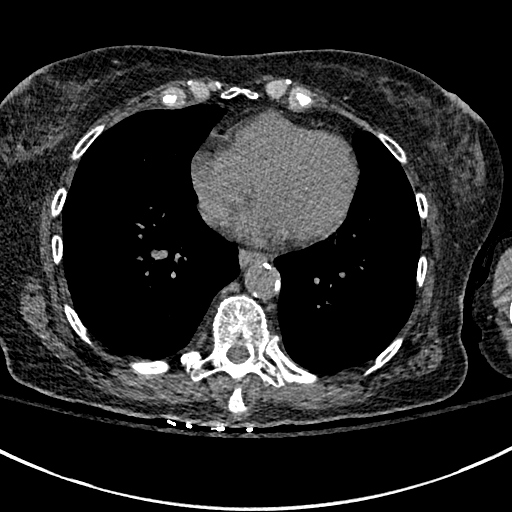
[im 63/169  lung]
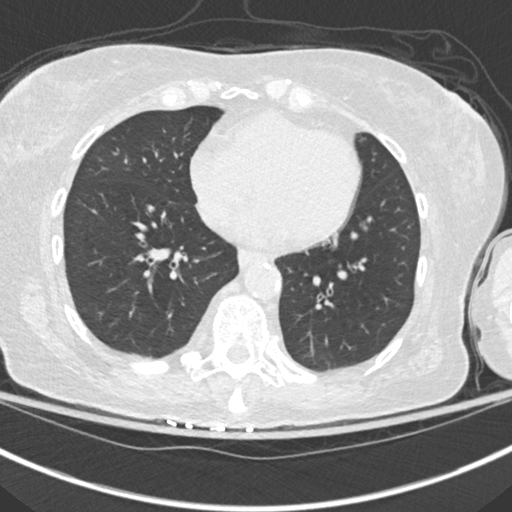
[im 75/169  lung]
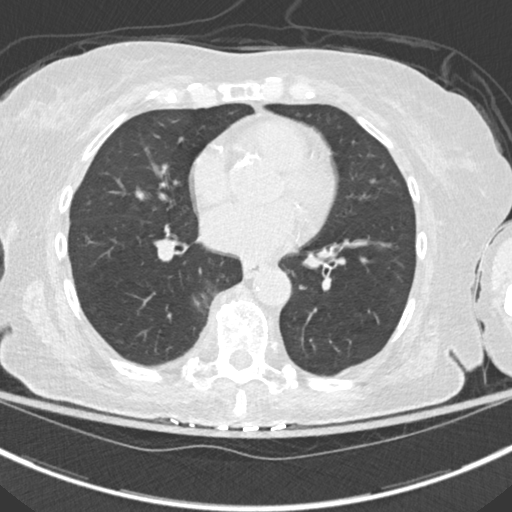
[im 94/169  lung]
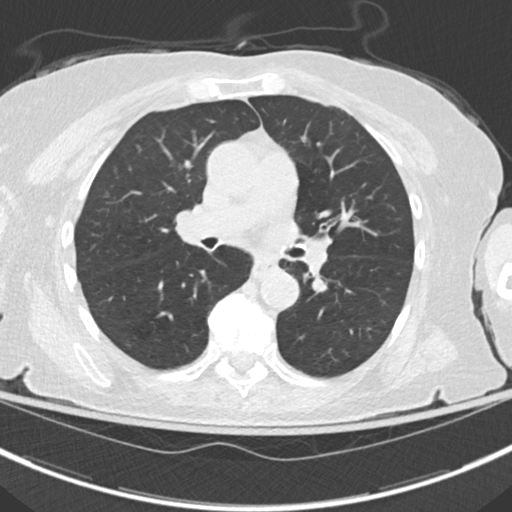
[im 106/169  lung]
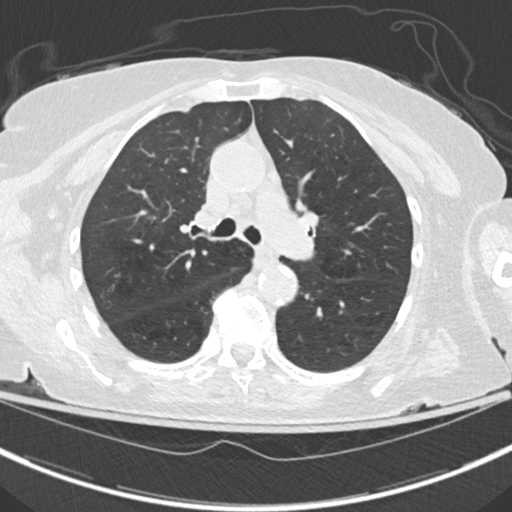
[im 119/169  mediastinal]
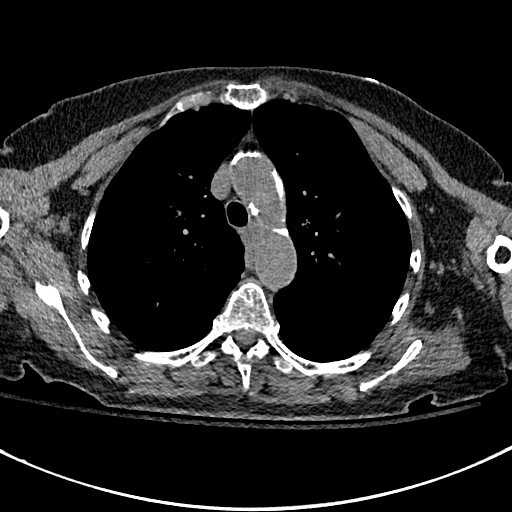
[im 119/169  lung]
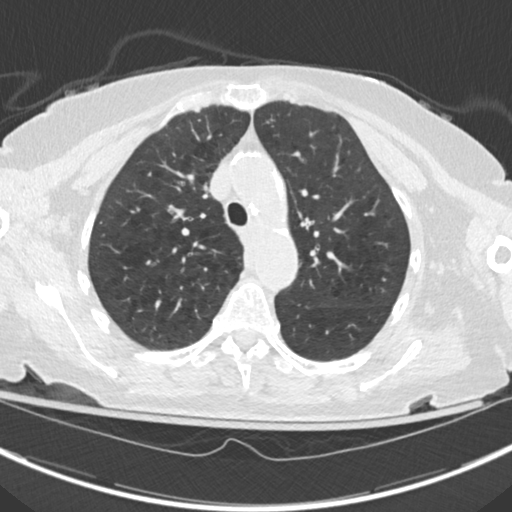
[im 131/169  lung]
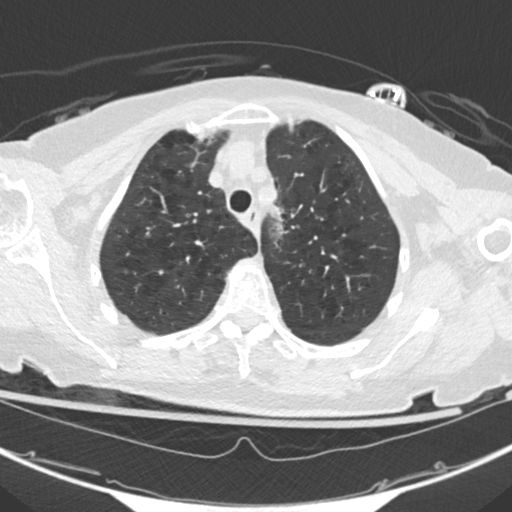
[im 144/169  lung]
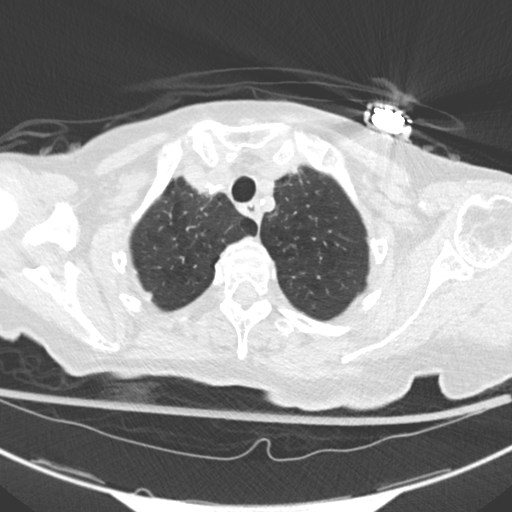
[im 156/169  lung]
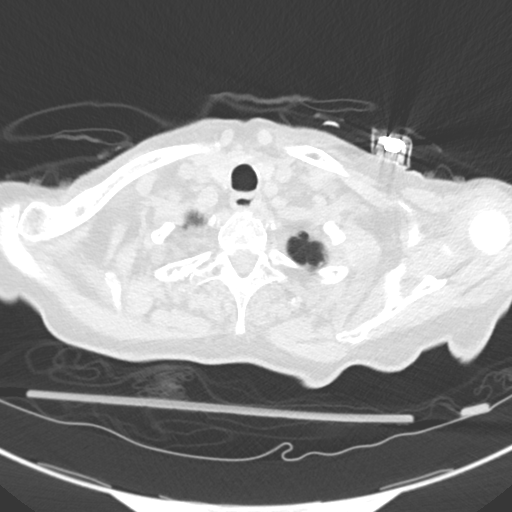

[Series 5: coronal · coronal · 0.60mm/px · 3 of 124 slices shown]
[im 25/124  lung]
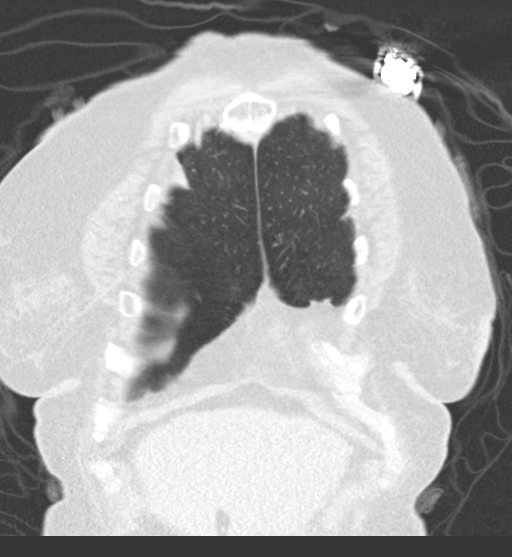
[im 50/124  lung]
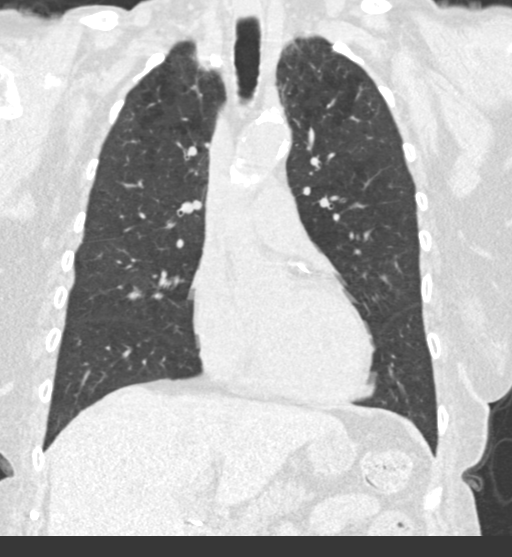
[im 74/124  lung]
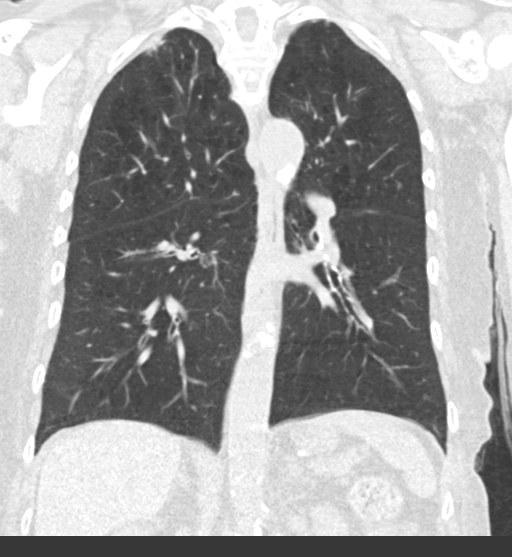

[15 of 36 positions shown; findings below may reference images not displayed]

FINDINGS: Cardiovascular: Calcified aortic atherosclerosis. Calcified coronary
artery atherosclerosis. No cardiomegaly or pericardial effusion.

Mediastinum/Nodes: No mediastinal hematoma or lymphadenopathy is
evident in the absence of IV contrast.

Lungs/Pleura: Major airways are patent. Centrilobular upper lobe
emphysema with confluent biapical scarring, partially calcified on
the right.

No pneumothorax. No pleural effusion. No pulmonary contusion. No
lung nodule or acute pulmonary opacity identified.

Upper Abdomen: Absent gallbladder. Negative visible noncontrast
liver, spleen, pancreas, adrenal glands, kidneys, and bowel in the
upper abdomen.

Musculoskeletal: Subtle anterolisthesis of T11 on T12 is associated
with chronic facet hypertrophy and degeneration there. Mild chronic
appearing T7 and T9 superior endplate compression and/or Schmorl's
nodes. Visible shoulder osseous structures appear intact. There is
osteopenia of the ribs. No convincing anterior or lateral right rib
fracture. However, there is a nondisplaced fracture of the right
posterior 7th rib on series 4, image 62.

No contralateral acute left rib fracture is identified. No
superficial soft tissue injury identified.
IMPRESSION: 1. Osteopenia of the ribs, with no convincing anterolateral right
rib fracture, however, there is a nondisplaced fracture of the right
posterior 7th rib.
2. No other acute traumatic injury identified in the noncontrast
chest.
3. Aortic Atherosclerosis (9G1MJ-N0F.F) and Emphysema ([id number]-[id number].X).

## 2021-11-13 ENCOUNTER — Emergency Department: Payer: Medicare HMO

## 2021-11-13 ENCOUNTER — Inpatient Hospital Stay
Admission: EM | Admit: 2021-11-13 | Discharge: 2021-11-16 | DRG: 178 | Disposition: A | Discharge status: Skilled Nursing Facility | Payer: Medicare HMO | Source: Skilled Nursing Facility | Attending: Internal Medicine | Admitting: Internal Medicine

## 2021-11-13 DIAGNOSIS — Z7982 Long term (current) use of aspirin: Secondary | ICD-10-CM

## 2021-11-13 DIAGNOSIS — F0390 Unspecified dementia without behavioral disturbance: Secondary | ICD-10-CM | POA: Diagnosis not present

## 2021-11-13 DIAGNOSIS — Z79899 Other long term (current) drug therapy: Secondary | ICD-10-CM

## 2021-11-13 DIAGNOSIS — R0902 Hypoxemia: Secondary | ICD-10-CM | POA: Diagnosis not present

## 2021-11-13 DIAGNOSIS — W19XXXA Unspecified fall, initial encounter: Secondary | ICD-10-CM

## 2021-11-13 DIAGNOSIS — N183 Chronic kidney disease, stage 3 unspecified: Secondary | ICD-10-CM | POA: Diagnosis present

## 2021-11-13 DIAGNOSIS — F418 Other specified anxiety disorders: Secondary | ICD-10-CM | POA: Diagnosis present

## 2021-11-13 DIAGNOSIS — Z634 Disappearance and death of family member: Secondary | ICD-10-CM

## 2021-11-13 DIAGNOSIS — W010XXA Fall on same level from slipping, tripping and stumbling without subsequent striking against object, initial encounter: Secondary | ICD-10-CM | POA: Diagnosis present

## 2021-11-13 DIAGNOSIS — U071 COVID-19: Principal | ICD-10-CM | POA: Diagnosis present

## 2021-11-13 DIAGNOSIS — E785 Hyperlipidemia, unspecified: Secondary | ICD-10-CM | POA: Diagnosis present

## 2021-11-13 DIAGNOSIS — J449 Chronic obstructive pulmonary disease, unspecified: Secondary | ICD-10-CM | POA: Diagnosis present

## 2021-11-13 DIAGNOSIS — Z885 Allergy status to narcotic agent status: Secondary | ICD-10-CM

## 2021-11-13 DIAGNOSIS — F0394 Unspecified dementia, unspecified severity, with anxiety: Secondary | ICD-10-CM | POA: Diagnosis present

## 2021-11-13 DIAGNOSIS — R451 Restlessness and agitation: Secondary | ICD-10-CM | POA: Diagnosis not present

## 2021-11-13 DIAGNOSIS — F03918 Unspecified dementia, unspecified severity, with other behavioral disturbance: Secondary | ICD-10-CM | POA: Diagnosis present

## 2021-11-13 DIAGNOSIS — I10 Essential (primary) hypertension: Secondary | ICD-10-CM | POA: Diagnosis present

## 2021-11-13 DIAGNOSIS — Z91040 Latex allergy status: Secondary | ICD-10-CM

## 2021-11-13 DIAGNOSIS — Z818 Family history of other mental and behavioral disorders: Secondary | ICD-10-CM

## 2021-11-13 DIAGNOSIS — N1831 Chronic kidney disease, stage 3a: Secondary | ICD-10-CM | POA: Diagnosis present

## 2021-11-13 DIAGNOSIS — F05 Delirium due to known physiological condition: Secondary | ICD-10-CM | POA: Diagnosis present

## 2021-11-13 DIAGNOSIS — Z23 Encounter for immunization: Secondary | ICD-10-CM

## 2021-11-13 DIAGNOSIS — Z7951 Long term (current) use of inhaled steroids: Secondary | ICD-10-CM

## 2021-11-13 DIAGNOSIS — I129 Hypertensive chronic kidney disease with stage 1 through stage 4 chronic kidney disease, or unspecified chronic kidney disease: Secondary | ICD-10-CM | POA: Diagnosis present

## 2021-11-13 DIAGNOSIS — Z87891 Personal history of nicotine dependence: Secondary | ICD-10-CM

## 2021-11-13 DIAGNOSIS — F0393 Unspecified dementia, unspecified severity, with mood disturbance: Secondary | ICD-10-CM | POA: Diagnosis present

## 2021-11-13 LAB — COMPREHENSIVE METABOLIC PANEL
ALT: 14 U/L (ref 0–44)
AST: 23 U/L (ref 15–41)
Albumin: 4.8 g/dL (ref 3.5–5.0)
Alkaline Phosphatase: 67 U/L (ref 38–126)
Anion gap: 10 (ref 5–15)
BUN: 16 mg/dL (ref 8–23)
CO2: 32 mmol/L (ref 22–32)
Calcium: 9.8 mg/dL (ref 8.9–10.3)
Chloride: 95 mmol/L — ABNORMAL LOW (ref 98–111)
Creatinine, Ser: 0.89 mg/dL (ref 0.44–1.00)
GFR, Estimated: >60 mL/min (ref >60)
Glucose, Bld: 94 mg/dL (ref 70–99)
Potassium: 4 mmol/L (ref 3.5–5.1)
Sodium: 137 mmol/L (ref 135–145)
Total Bilirubin: 0.4 mg/dL (ref 0.3–1.2)
Total Protein: 8.1 g/dL (ref 6.5–8.1)

## 2021-11-13 LAB — CBC WITH DIFFERENTIAL/PLATELET
Abs Immature Granulocytes: 0.03 10*3/uL (ref 0.00–0.07)
Basophils Absolute: 0 10*3/uL (ref 0.0–0.1)
Basophils Relative: 1 %
Eosinophils Absolute: 0.4 10*3/uL (ref 0.0–0.5)
Eosinophils Relative: 7 %
HCT: 42.5 % (ref 36.0–46.0)
Hemoglobin: 13.5 g/dL (ref 12.0–15.0)
Immature Granulocytes: 1 %
Lymphocytes Relative: 9 %
Lymphs Abs: 0.5 10*3/uL — ABNORMAL LOW (ref 0.7–4.0)
MCH: 30.5 pg (ref 26.0–34.0)
MCHC: 31.8 g/dL (ref 30.0–36.0)
MCV: 95.9 fL (ref 80.0–100.0)
Monocytes Absolute: 0.7 10*3/uL (ref 0.1–1.0)
Monocytes Relative: 12 %
Neutro Abs: 4.3 10*3/uL (ref 1.7–7.7)
Neutrophils Relative %: 70 %
Platelets: 246 10*3/uL (ref 150–400)
RBC: 4.43 MIL/uL (ref 3.87–5.11)
RDW: 12.9 % (ref 11.5–15.5)
WBC: 6 10*3/uL (ref 4.0–10.5)
nRBC: 0 % (ref 0.0–0.2)

## 2021-11-13 LAB — URINALYSIS, ROUTINE W REFLEX MICROSCOPIC
Bilirubin Urine: NEGATIVE
Glucose, UA: NEGATIVE mg/dL
Hgb urine dipstick: NEGATIVE
Ketones, ur: NEGATIVE mg/dL
Leukocytes,Ua: NEGATIVE
Nitrite: NEGATIVE
Protein, ur: NEGATIVE mg/dL
Specific Gravity, Urine: 1.009 (ref 1.005–1.030)
pH: 7 (ref 5.0–8.0)

## 2021-11-13 LAB — TROPONIN I (HIGH SENSITIVITY): Troponin I (High Sensitivity): 11 ng/L (ref <18)

## 2021-11-13 LAB — CK: Total CK: 207 U/L (ref 38–234)

## 2021-11-13 LAB — SARS CORONAVIRUS 2 BY RT PCR: SARS Coronavirus 2 by RT PCR: POSITIVE — AB

## 2021-11-13 LAB — TSH: TSH: 2.329 u[IU]/mL (ref 0.350–4.500)

## 2021-11-13 LAB — PROCALCITONIN: Procalcitonin: <0.1 ng/mL

## 2021-11-13 LAB — T4, FREE: Free T4: 0.64 ng/dL (ref 0.61–1.12)

## 2021-11-13 MED ORDER — ACETAMINOPHEN 500 MG PO TABS
1000.0000 mg | ORAL_TABLET | Freq: Once | ORAL | Status: AC
Start: 2021-11-13 — End: 2021-11-13
  Administered 2021-11-13: 1000 mg via ORAL
  Filled 2021-11-13: qty 2

## 2021-11-13 MED ORDER — HALOPERIDOL LACTATE 5 MG/ML IJ SOLN
5.0000 mg | Freq: Once | INTRAMUSCULAR | Status: AC
  Administered 2021-11-13: 5 mg via INTRAMUSCULAR
  Filled 2021-11-13: qty 1

## 2021-11-13 MED ORDER — NIRMATRELVIR/RITONAVIR (PAXLOVID)TABLET
3.0000 | ORAL_TABLET | Freq: Two times a day (BID) | ORAL | Status: DC
  Administered 2021-11-13 – 2021-11-16 (×6): 3 via ORAL
  Filled 2021-11-13: qty 30

## 2021-11-13 MED ORDER — TETANUS-DIPHTH-ACELL PERTUSSIS 5-2.5-18.5 LF-MCG/0.5 IM SUSY
0.5000 mL | PREFILLED_SYRINGE | Freq: Once | INTRAMUSCULAR | Status: DC
  Filled 2021-11-13: qty 0.5

## 2021-11-13 NOTE — ED Notes (Signed)
Ems called to transport patient back to the [facility] [place]

## 2021-11-13 NOTE — ED Notes (Signed)
Patient has removed bed alarm from under sheet.  Has cords in hand and attempting to bite cords.  For safety, cords removed from patient sight.  Bed adjusted for comfort and to promote safety for patient.

## 2021-11-13 NOTE — ED Notes (Signed)
Patient's niece called and informed of patient's current status.  Niece states that patient "can get agitated."  Phone given to patient to speak to niece.  Niece was unable to calm or redirect patient.

## 2021-11-13 NOTE — Discharge Instructions (Signed)
Patient is COVID-positive but otherwise vitals are reassuring work-up.  We have started her on the antiviral therapy and she develops worsening shortness of breath or worsening symptoms please return to the ER for repeat evaluation  Make sure to quarentine pt

## 2021-11-13 NOTE — ED Notes (Signed)
Patient attempting to get out of bed.  Has removed all monitoring equipment and attempting to wrap around herself.  Removed monitoring leads from patient and replaced bed alarm.  Patient encouraged to remain in bed.  Call bell at bedside.

## 2021-11-13 NOTE — ED Notes (Signed)
Patient continues to walk around department.  Refusing to return to room and refusing to be transported with EMS.  Patient walked into ED room 23 in the [facility] [employer].  Staff and security at bedside.

## 2021-11-13 NOTE — ED Notes (Signed)
Medication given to help de-escalate patient.  Patient refusing to get in wheelchair and return to room.  Remains agitated.

## 2021-11-13 NOTE — ED Notes (Signed)
Ems did not  transport patient back to the [facility] [place] due to patient's behavior at the time of arrival

## 2021-11-13 NOTE — ED Notes (Signed)
91% RA upon ambulation

## 2021-11-13 NOTE — ED Notes (Signed)
EMS here to transport patient back to facility.  Patient continues to refuse vaccination and oral medication.  Patient able to put her clothing on.  When informed that EMS was here to transport her, she states that she was not going with them and she was "going home."  Patient out in hallway and refusing to return to room.  Consulting civil engineer and staff members attempting to redirect patient.  Patient refused and stated walking away.  Walked into another patient's room and attempting to find exit.  Patient redirected out of patient's room.  Will not return to her current room and will not follow commands at this time.  Continues to walk away from staff and EMS.  Remains agitated.

## 2021-11-13 NOTE — ED Notes (Addendum)
Patient out of bed.  Has taken all blankets off bed and placed them in the corner of the room.  Patient urinated in blankets and appears agitated.  Able to get patient to return to bed and bed alarm replaced.  Monitoring leads replaced to attempt vital signs.  Patient will not allow vitals to be taken at this time.  MD aware

## 2021-11-13 NOTE — ED Notes (Signed)
Unable to obtain vital signs at this time.  Patient attempting to get out of bed and combative.  Will attempt again shortly

## 2021-11-13 NOTE — ED Notes (Signed)
Pt moved back to medical room 9 with sitter in place.

## 2021-11-13 NOTE — ED Notes (Signed)
Writer in route another pts room and witnesses pts bed elevated and pt attempting to slide off of end and stand. Writer in room with pts assigned nurse to assist pt. Pt yelling at staff as well as continuously arguing and attempting to swing towards staff. At time, [facility] to pick pt up but pt refusing all care and not willing to go with EMS back to facility. ED MD Fuller Plan called to room due to increase in pts violent outburst and inability to consent to safe treatment. Pt walking out of room and staff able to get pt to room 23 once emergent IVC paperwork established and security helping.

## 2021-11-13 NOTE — ED Provider Notes (Signed)
[facility] Provider Note    Event Date/Time   First MD Initiated Contact with Patient 11/13/21 1619     (approximate)   History   Fall (Unwitnessed fall at [facility] [address] Altered mental status. Arrived via [facility]. )   HPI  Samantha Carpenter is a 81 y.o. female with dementia, HTN, COPD who comes in with known dementia who comes in from [place] due to unwitnessed fall. Patient was initially combative with EMS did not give any medications but patient seems little bit more calm down now.  Unclear if she had any injuries from the fall.  Talked to Aaron Mose pts sister who stated that patient was acting more combative with the staff and then she slipped and she fell.  She states that Sunday she has days where she is acting more combative and then Sunday she is acting very sweet.    Physical Exam   Triage Vital Signs: Blood pressure (!) 177/75, pulse 65, temperature 98.1 F (36.7 C), temperature source Oral, resp. rate 20, height 5\' 8"  (1.727 m), weight 75 kg, SpO2 99 %.   Most recent vital signs: Vitals:   11/13/21 1629  BP: (!) 177/75  Pulse: 65  Resp: 20  Temp: 98.1 F (36.7 C)  SpO2: 99%     General: Awake, no distress.  CV:  Good peripheral perfusion.  Resp:  Normal effort.  Abd:  No distention.  No obvious tenderness Other:  Patient appears slightly sleepy but then will wake up when I told her that if she works with my examination that she would get home faster she states "what home what I even be going to"  She also states "I do not like you." When I try to examine her eyes she holds her eyes shut but I am able to open them and see that pupils are reactive bilaterally.  She refuses to move any extremities   ED Results / Procedures / Treatments   Labs (all labs ordered are listed, but only abnormal results are displayed) Labs Reviewed  SARS CORONAVIRUS 2 BY RT PCR - Abnormal; Notable for the following components:      Result Value    SARS Coronavirus 2 by RT PCR POSITIVE (*)    All other components within normal limits  CBC WITH DIFFERENTIAL/PLATELET - Abnormal; Notable for the following components:   Lymphs Abs 0.5 (*)    All other components within normal limits  COMPREHENSIVE METABOLIC PANEL - Abnormal; Notable for the following components:   Chloride 95 (*)    All other components within normal limits  BLOOD GAS, VENOUS - Abnormal; Notable for the following components:   pCO2, Ven 33 (*)    Bicarbonate 17.4 (*)    Acid-base deficit 7.5 (*)    All other components within normal limits  URINALYSIS, ROUTINE W REFLEX MICROSCOPIC - Abnormal; Notable for the following components:   Color, Urine STRAW (*)    APPearance CLEAR (*)    All other components within normal limits  CK  TSH  T4, FREE  TROPONIN I (HIGH SENSITIVITY)     EKG  My interpretation of EKG:  Normal sinus rhythm 64 without any ST elevation, T wave version aVL, normal intervals  RADIOLOGY I have reviewed the xray personally and interpreted no evidence of pneumonia   PROCEDURES:  Critical Care performed: No  Procedures   MEDICATIONS ORDERED IN ED: Medications  nirmatrelvir/ritonavir EUA (PAXLOVID) 3 tablet (has no administration in time range)  IMPRESSION / MDM / ASSESSMENT AND PLAN / ED COURSE  I reviewed the triage vital signs and the nursing notes.   Patient's presentation is most consistent with acute presentation with potential threat to life or bodily function.   Differential is intracranial hemorrhage, cervical fracture, rib fracture, hip fracture, Electra abnormalities, AKI.  UA is negative.  COVID test is positive thyroid reassuring.  CBC reassuring.  Chloride slightly low.  No evidence of hypercapnia.  Troponin negative no cardiac symptoms to suggest ACS.  CT imaging of head and neck were negative and her x-rays were normal.  Patient is been up ambulatory around the room with oxygen level above 91% with ambulation  and was 99% at rest.  It is also not a great waveform when she was ambulating so not quite sure how accurate that is but definitely not hypoxic.  6:56 PM family is now at bedside who reports that she has been acting her baseline self.    Discussed with pt niece about being covid + -- called the sister back.  And updated her as well.  They feel comfortable with patient going back to facility.  Patient is now been up and ambulatory around the room.  She does have small abrasion on her right ankle but full range of motion without any tenderness and has been ambulatory on it.  We will update tetanus  Upon trying to discharge patient patient was very agitated.  According to patient's family members when I called him back this is what is been happening at the facility that she is getting way more agitated than normal.  Patient is unwilling to get on the EMS stretcher to go back she attempted to try to walk out of the emergency room and was unable to be verbally directed therefore IVC was placed and patient was escorted by police back to a room and patient was given 5 of IM Haldol.  Given patient's agitation episodes patient will not be able to be placed in the facility due to the COVID-positive state.  Do not feel that patient can go back home therefore will admit for altered mental status from COVID    The patient is on the cardiac monitor to evaluate for evidence of arrhythmia and/or significant heart rate changes.      FINAL CLINICAL IMPRESSION(S) / ED DIAGNOSES   Final diagnoses:  Fall, initial encounter  COVID-19  Agitation     Rx / DC Orders   ED Discharge Orders     None        Note:  This document was prepared using Dragon voice recognition software and may include unintentional dictation errors.   Concha Se, MD 11/13/21 [facility]    Concha Se, MD 11/14/21 [phone]

## 2021-11-13 NOTE — ED Notes (Signed)
Patient continues to remain agitated.  Will not allow for vital signs to be taken.  Unable to administer Tdap at this time.  Patient refusing all interventions.

## 2021-11-13 NOTE — ED Notes (Signed)
Patient attempting to get out of bed.  States she needs to use restroom.  Assisted to bedside commode.  Steady gait noted at this time.

## 2021-11-13 NOTE — ED Notes (Signed)
Niece at bedside.  Has taken patient's personal belongings with her for safety.  Patient has clothes and flip flops at bedside

## 2021-11-13 NOTE — ED Triage Notes (Signed)
Pt arrived [facility]. Unwitnessed fall. Altered mental status on arrival.

## 2021-11-13 NOTE — ED Notes (Addendum)
Pt sitting on bed in room 23 with 1:1 sitter until medications given assist in calming pt down so that staff can safely check pts VS as well as assess for other medical attention by moving her back to a medical room with sitter in place.

## 2021-11-14 ENCOUNTER — Other Ambulatory Visit: Payer: Self-pay

## 2021-11-14 DIAGNOSIS — F0393 Unspecified dementia, unspecified severity, with mood disturbance: Secondary | ICD-10-CM | POA: Diagnosis present

## 2021-11-14 DIAGNOSIS — F05 Delirium due to known physiological condition: Secondary | ICD-10-CM | POA: Diagnosis present

## 2021-11-14 DIAGNOSIS — Z23 Encounter for immunization: Secondary | ICD-10-CM | POA: Diagnosis not present

## 2021-11-14 DIAGNOSIS — Z7982 Long term (current) use of aspirin: Secondary | ICD-10-CM | POA: Diagnosis not present

## 2021-11-14 DIAGNOSIS — Z87891 Personal history of nicotine dependence: Secondary | ICD-10-CM | POA: Diagnosis not present

## 2021-11-14 DIAGNOSIS — W010XXA Fall on same level from slipping, tripping and stumbling without subsequent striking against object, initial encounter: Secondary | ICD-10-CM | POA: Diagnosis present

## 2021-11-14 DIAGNOSIS — U071 COVID-19: Secondary | ICD-10-CM | POA: Diagnosis present

## 2021-11-14 DIAGNOSIS — J449 Chronic obstructive pulmonary disease, unspecified: Secondary | ICD-10-CM | POA: Diagnosis present

## 2021-11-14 DIAGNOSIS — F0390 Unspecified dementia without behavioral disturbance: Secondary | ICD-10-CM | POA: Diagnosis not present

## 2021-11-14 DIAGNOSIS — F03918 Unspecified dementia, unspecified severity, with other behavioral disturbance: Secondary | ICD-10-CM | POA: Diagnosis present

## 2021-11-14 DIAGNOSIS — F0394 Unspecified dementia, unspecified severity, with anxiety: Secondary | ICD-10-CM | POA: Diagnosis present

## 2021-11-14 DIAGNOSIS — R451 Restlessness and agitation: Secondary | ICD-10-CM | POA: Diagnosis present

## 2021-11-14 DIAGNOSIS — E785 Hyperlipidemia, unspecified: Secondary | ICD-10-CM | POA: Diagnosis present

## 2021-11-14 DIAGNOSIS — Z91040 Latex allergy status: Secondary | ICD-10-CM | POA: Diagnosis not present

## 2021-11-14 DIAGNOSIS — Z7951 Long term (current) use of inhaled steroids: Secondary | ICD-10-CM | POA: Diagnosis not present

## 2021-11-14 DIAGNOSIS — R0902 Hypoxemia: Secondary | ICD-10-CM | POA: Diagnosis not present

## 2021-11-14 DIAGNOSIS — Z79899 Other long term (current) drug therapy: Secondary | ICD-10-CM | POA: Diagnosis not present

## 2021-11-14 DIAGNOSIS — Z818 Family history of other mental and behavioral disorders: Secondary | ICD-10-CM | POA: Diagnosis not present

## 2021-11-14 DIAGNOSIS — Z634 Disappearance and death of family member: Secondary | ICD-10-CM | POA: Diagnosis not present

## 2021-11-14 DIAGNOSIS — N1831 Chronic kidney disease, stage 3a: Secondary | ICD-10-CM | POA: Diagnosis present

## 2021-11-14 DIAGNOSIS — I129 Hypertensive chronic kidney disease with stage 1 through stage 4 chronic kidney disease, or unspecified chronic kidney disease: Secondary | ICD-10-CM | POA: Diagnosis present

## 2021-11-14 DIAGNOSIS — Z885 Allergy status to narcotic agent status: Secondary | ICD-10-CM | POA: Diagnosis not present

## 2021-11-14 LAB — CBC WITH DIFFERENTIAL/PLATELET
Abs Immature Granulocytes: 0.02 10*3/uL (ref 0.00–0.07)
Basophils Absolute: 0 10*3/uL (ref 0.0–0.1)
Basophils Relative: 0 %
Eosinophils Absolute: 0.3 10*3/uL (ref 0.0–0.5)
Eosinophils Relative: 6 %
HCT: 39.4 % (ref 36.0–46.0)
Hemoglobin: 12.5 g/dL (ref 12.0–15.0)
Immature Granulocytes: 0 %
Lymphocytes Relative: 15 %
Lymphs Abs: 0.7 10*3/uL (ref 0.7–4.0)
MCH: 30.4 pg (ref 26.0–34.0)
MCHC: 31.7 g/dL (ref 30.0–36.0)
MCV: 95.9 fL (ref 80.0–100.0)
Monocytes Absolute: 0.6 10*3/uL (ref 0.1–1.0)
Monocytes Relative: 14 %
Neutro Abs: 3 10*3/uL (ref 1.7–7.7)
Neutrophils Relative %: 65 %
Platelets: 213 10*3/uL (ref 150–400)
RBC: 4.11 MIL/uL (ref 3.87–5.11)
RDW: 12.6 % (ref 11.5–15.5)
WBC: 4.6 10*3/uL (ref 4.0–10.5)
nRBC: 0 % (ref 0.0–0.2)

## 2021-11-14 LAB — COMPREHENSIVE METABOLIC PANEL
ALT: 13 U/L (ref 0–44)
AST: 23 U/L (ref 15–41)
Albumin: 4.1 g/dL (ref 3.5–5.0)
Alkaline Phosphatase: 60 U/L (ref 38–126)
Anion gap: 8 (ref 5–15)
BUN: 14 mg/dL (ref 8–23)
CO2: 31 mmol/L (ref 22–32)
Calcium: 9.1 mg/dL (ref 8.9–10.3)
Chloride: 99 mmol/L (ref 98–111)
Creatinine, Ser: 0.84 mg/dL (ref 0.44–1.00)
GFR, Estimated: >60 mL/min (ref >60)
Glucose, Bld: 93 mg/dL (ref 70–99)
Potassium: 3.4 mmol/L — ABNORMAL LOW (ref 3.5–5.1)
Sodium: 138 mmol/L (ref 135–145)
Total Bilirubin: 0.6 mg/dL (ref 0.3–1.2)
Total Protein: 7 g/dL (ref 6.5–8.1)

## 2021-11-14 LAB — D-DIMER, QUANTITATIVE: D-Dimer, Quant: 1.03 ug/mL-FEU — ABNORMAL HIGH (ref 0.00–0.50)

## 2021-11-14 LAB — C-REACTIVE PROTEIN: CRP: 2.7 mg/dL — ABNORMAL HIGH (ref <1.0)

## 2021-11-14 LAB — FERRITIN: Ferritin: 54 ng/mL (ref 11–307)

## 2021-11-14 LAB — PROCALCITONIN: Procalcitonin: <0.1 ng/mL

## 2021-11-14 MED ORDER — DULOXETINE HCL 30 MG PO CPEP
60.0000 mg | ORAL_CAPSULE | Freq: Every day | ORAL | Status: DC
  Administered 2021-11-14 – 2021-11-16 (×3): 60 mg via ORAL
  Filled 2021-11-14: qty 1
  Filled 2021-11-14: qty 2
  Filled 2021-11-14: qty 1

## 2021-11-14 MED ORDER — BISOPROLOL FUMARATE 5 MG PO TABS
5.0000 mg | ORAL_TABLET | Freq: Two times a day (BID) | ORAL | Status: DC
  Administered 2021-11-14 – 2021-11-15 (×4): 5 mg via ORAL
  Filled 2021-11-14 (×6): qty 1

## 2021-11-14 MED ORDER — HALOPERIDOL 2 MG PO TABS: 2.0000 mg | ORAL_TABLET | Freq: Four times a day (QID) | ORAL | Status: DC | PRN

## 2021-11-14 MED ORDER — ACETAMINOPHEN 325 MG PO TABS
650.0000 mg | ORAL_TABLET | Freq: Four times a day (QID) | ORAL | Status: DC | PRN
  Administered 2021-11-14 (×2): 650 mg via ORAL
  Filled 2021-11-14 (×2): qty 2

## 2021-11-14 MED ORDER — GABAPENTIN 100 MG PO CAPS
100.0000 mg | ORAL_CAPSULE | Freq: Two times a day (BID) | ORAL | Status: DC
  Administered 2021-11-14 – 2021-11-16 (×5): 100 mg via ORAL
  Filled 2021-11-14 (×5): qty 1

## 2021-11-14 MED ORDER — CLONAZEPAM 1 MG PO TABS
1.0000 mg | ORAL_TABLET | Freq: Two times a day (BID) | ORAL | Status: DC
  Administered 2021-11-14 – 2021-11-16 (×5): 1 mg via ORAL
  Filled 2021-11-14: qty 1
  Filled 2021-11-14 (×3): qty 2
  Filled 2021-11-14: qty 1

## 2021-11-14 MED ORDER — HYDROCHLOROTHIAZIDE 12.5 MG PO TABS
12.5000 mg | ORAL_TABLET | Freq: Every day | ORAL | Status: DC
  Administered 2021-11-14 – 2021-11-16 (×3): 12.5 mg via ORAL
  Filled 2021-11-14 (×3): qty 1

## 2021-11-14 MED ORDER — ONDANSETRON HCL 4 MG/2ML IJ SOLN: 4.0000 mg | Freq: Four times a day (QID) | INTRAMUSCULAR | Status: DC | PRN

## 2021-11-14 MED ORDER — LOSARTAN POTASSIUM 50 MG PO TABS
100.0000 mg | ORAL_TABLET | Freq: Every day | ORAL | Status: DC
  Administered 2021-11-14 – 2021-11-16 (×3): 100 mg via ORAL
  Filled 2021-11-14 (×3): qty 2

## 2021-11-14 MED ORDER — AMLODIPINE BESYLATE 5 MG PO TABS
5.0000 mg | ORAL_TABLET | Freq: Every day | ORAL | Status: DC
  Administered 2021-11-14 – 2021-11-15 (×2): 5 mg via ORAL
  Filled 2021-11-14 (×2): qty 1

## 2021-11-14 MED ORDER — MOMETASONE FURO-FORMOTEROL FUM 200-5 MCG/ACT IN AERO
2.0000 | INHALATION_SPRAY | Freq: Two times a day (BID) | RESPIRATORY_TRACT | Status: DC
  Administered 2021-11-14 – 2021-11-16 (×5): 2 via RESPIRATORY_TRACT
  Filled 2021-11-14: qty 8.8

## 2021-11-14 MED ORDER — ALBUTEROL SULFATE HFA 108 (90 BASE) MCG/ACT IN AERS: 2.0000 | INHALATION_SPRAY | Freq: Four times a day (QID) | RESPIRATORY_TRACT | Status: DC | PRN

## 2021-11-14 MED ORDER — HALOPERIDOL LACTATE 5 MG/ML IJ SOLN: 2.0000 mg | Freq: Four times a day (QID) | INTRAMUSCULAR | Status: DC | PRN

## 2021-11-14 MED ORDER — RIVASTIGMINE TARTRATE 1.5 MG PO CAPS
1.5000 mg | ORAL_CAPSULE | Freq: Two times a day (BID) | ORAL | Status: DC
  Administered 2021-11-14 – 2021-11-16 (×5): 1.5 mg via ORAL
  Filled 2021-11-14 (×5): qty 1

## 2021-11-14 MED ORDER — HEPARIN SODIUM (PORCINE) 5000 UNIT/ML IJ SOLN
5000.0000 [IU] | Freq: Three times a day (TID) | INTRAMUSCULAR | Status: DC
  Administered 2021-11-14 – 2021-11-15 (×5): 5000 [IU] via SUBCUTANEOUS
  Filled 2021-11-14 (×6): qty 1

## 2021-11-14 MED ORDER — BUPROPION HCL ER (XL) 150 MG PO TB24
150.0000 mg | ORAL_TABLET | Freq: Every day | ORAL | Status: DC
  Administered 2021-11-14 – 2021-11-16 (×3): 150 mg via ORAL
  Filled 2021-11-14 (×3): qty 1

## 2021-11-14 MED ORDER — POTASSIUM CHLORIDE CRYS ER 20 MEQ PO TBCR
20.0000 meq | EXTENDED_RELEASE_TABLET | Freq: Once | ORAL | Status: AC
  Administered 2021-11-14: 20 meq via ORAL
  Filled 2021-11-14: qty 1

## 2021-11-14 MED ORDER — POLYETHYLENE GLYCOL 3350 17 G PO PACK
17.0000 g | PACK | Freq: Every day | ORAL | Status: DC
  Administered 2021-11-16: 17 g via ORAL
  Filled 2021-11-14: qty 1

## 2021-11-14 MED ORDER — LOSARTAN POTASSIUM-HCTZ 100-12.5 MG PO TABS: 1.0000 | ORAL_TABLET | Freq: Every day | ORAL | Status: DC

## 2021-11-14 MED ORDER — ONDANSETRON HCL 4 MG PO TABS: 4.0000 mg | ORAL_TABLET | Freq: Four times a day (QID) | ORAL | Status: DC | PRN

## 2021-11-14 MED ORDER — ASPIRIN 81 MG PO TBEC
81.0000 mg | DELAYED_RELEASE_TABLET | Freq: Every day | ORAL | Status: DC
  Administered 2021-11-14 – 2021-11-16 (×3): 81 mg via ORAL
  Filled 2021-11-14 (×3): qty 1

## 2021-11-14 MED ORDER — NIRMATRELVIR/RITONAVIR (PAXLOVID)TABLET: 3.0000 | ORAL_TABLET | Freq: Two times a day (BID) | ORAL | Status: DC

## 2021-11-14 NOTE — ED Notes (Signed)
Pt asleep, pt not offered a snack at this time.

## 2021-11-14 NOTE — ED Notes (Signed)
Pt resting comfortably VSS 

## 2021-11-14 NOTE — ED Notes (Signed)
Patient calm and cooperative at this time.  Currently resting with eyes closed.  Allowed for vital signs to be taken.  Patient with no voiced complaints.

## 2021-11-14 NOTE — ED Notes (Signed)
Patient cooperative at this time.  Able to start IV and obtain lab work.  Monitoring equipment placed on patient.  Bed alarm and yellow bracelet in place at this time.  Sitter at bedside.  Will continue to monitor

## 2021-11-14 NOTE — ED Notes (Signed)
Patient calm, pleasant and cooperative with care. Assisted up to toilet. Initially unsteady gait but ambulatory with standby assist. Continent of urine and assisted back to bed. Linens changed and snack provided. Patient grateful for care. No distress noted at this time.

## 2021-11-14 NOTE — Consult Note (Addendum)
[facility] Face-to-Face Psychiatry Consult   Reason for Consult: Assessment for possible release of IVC Referring Physician: Sreenath Patient Identification: Samantha Carpenter MRN:  [id number] Principal Diagnosis: Dementia, senile, without behavioral disturbance (HCC) Diagnosis:  Principal Problem:   Dementia, senile, without behavioral disturbance (HCC) Active Problems:   COVID-19   Total Time spent with patient: 20 minutes  Subjective:   Samantha Carpenter is a 81 y.o. female patient admitted with COVID 19.  HPI: Psychiatry was consulted because the patient was originally placed on involuntary commitment due to her dementia and wanting to leave.  Patient has COVID and is medically admitted at this time.  On evaluation, patient is alert to self.  She is able to tell me the year.  She can tell me her name.  She does not recall why she is here.  She states she is in "prison" when she is asked where she is, and then she laughs.  Then she is able to tell me she is in the hospital and it does look like a hospital room.  She says she is in [place].  She is able to tell me that her husband died when they lived in [place] and now she lives in the [place] with her sister.  Patient appears pleasantly confused.  She has not shown any aggression to this provider.  However it is unknown if she would show aggression, as historically this may happen with her diagnoses.  I would advise IVC remaining in place and reevaluate for the need morrow, based on her behaviors this evening, especially if she is going to be moving to the medical floor.  Past Psychiatric History: Dementia, depression, anxiety  Risk to Self:   Risk to Others:   Prior Inpatient Therapy:   Prior Outpatient Therapy:    Past Medical History:  Past Medical History:  Diagnosis Date   Chronic kidney disease    COPD (chronic obstructive pulmonary disease) (HCC)    Dementia (HCC)    Depression    Hypertension    Liver disease     Past  Surgical History:  Procedure Laterality Date   ABDOMINAL HYSTERECTOMY     CHOLECYSTECTOMY     ESOPHAGOGASTRODUODENOSCOPY N/A 11/20/2019   Procedure: ESOPHAGOGASTRODUODENOSCOPY (EGD);  Surgeon: Regis Bill, MD;  Location: [facility] ENDOSCOPY;  Service: Gastroenterology;  Laterality: N/A;   face tuck     Family History:  Family History  Problem Relation Age of Onset   Bipolar disorder Brother    Schizophrenia Brother    Bipolar disorder Sister    Schizophrenia Sister    Drug abuse Son    Family Psychiatric  History:  Social History:  Social History   Substance and Sexual Activity  Alcohol Use Not Currently     Social History   Substance and Sexual Activity  Drug Use Not Currently    Social History   Socioeconomic History   Marital status: Widowed    Spouse name: Not on file   Number of children: 1   Years of education: Not on file   Highest education level: GED or equivalent  Occupational History   Not on file  Tobacco Use   Smoking status: Former    Types: Cigarettes    Quit date: 09/01/2009    Years since quitting: 12.2   Smokeless tobacco: Never  Vaping Use   Vaping Use: Never used  Substance and Sexual Activity   Alcohol use: Not Currently   Drug use: Not Currently   Sexual activity: Not  Currently  Other Topics Concern   Not on file  Social History Narrative   Not on file   Social Determinants of Health   Financial Resource Strain: Low Risk  (09/02/2018)   Overall Financial Resource Strain (CARDIA)    Difficulty of Paying Living Expenses: Not hard at all  Food Insecurity: No Food Insecurity (09/02/2018)   Hunger Vital Sign    Worried About Running Out of Food in the Last Year: Never true    Ran Out of Food in the Last Year: Never true  Transportation Needs: No Transportation Needs (09/02/2018)   PRAPARE - Administrator, Civil Service (Medical): No    Lack of Transportation (Non-Medical): No  Physical Activity: Sufficiently Active  (09/02/2018)   Exercise Vital Sign    Days of Exercise per Week: 5 days    Minutes of Exercise per Session: 30 min  Stress: No Stress Concern Present (09/02/2018)   [employer] of Occupational Health - Occupational Stress Questionnaire    Feeling of Stress : Not at all  Social Connections: Unknown (09/02/2018)   Social Connection and Isolation Panel [NHANES]    Frequency of Communication with Friends and Family: Not on file    Frequency of Social Gatherings with Friends and Family: Not on file    Attends Religious Services: More than 4 times per year    Active Member of [employer] or Organizations: No    Attends Banker Meetings: Never    Marital Status: Widowed   Additional Social History:    Allergies:   Allergies  Allergen Reactions   Morphine And Related    Latex Rash    "I break out"    Labs:  Results for orders placed or performed during the hospital encounter of 11/13/21 (from the past 48 hour(s))  CBC with Differential     Status: Abnormal   Collection Time: 11/13/21  4:25 PM  Result Value Ref Range   WBC 6.0 4.0 - 10.5 K/uL   RBC 4.43 3.87 - 5.11 MIL/uL   Hemoglobin 13.5 12.0 - 15.0 g/dL   HCT 88.5 02.7 - 74.1 %   MCV 95.9 80.0 - 100.0 fL   MCH 30.5 26.0 - 34.0 pg   MCHC 31.8 30.0 - 36.0 g/dL   RDW 28.7 86.7 - 67.2 %   Platelets 246 150 - 400 K/uL   nRBC 0.0 0.0 - 0.2 %   Neutrophils Relative % 70 %   Neutro Abs 4.3 1.7 - 7.7 K/uL   Lymphocytes Relative 9 %   Lymphs Abs 0.5 (L) 0.7 - 4.0 K/uL   Monocytes Relative 12 %   Monocytes Absolute 0.7 0.1 - 1.0 K/uL   Eosinophils Relative 7 %   Eosinophils Absolute 0.4 0.0 - 0.5 K/uL   Basophils Relative 1 %   Basophils Absolute 0.0 0.0 - 0.1 K/uL   Immature Granulocytes 1 %   Abs Immature Granulocytes 0.03 0.00 - 0.07 K/uL    Comment: Performed at [facility], [address] [place], [place] [address]  Comprehensive metabolic panel     Status: Abnormal   Collection Time: 11/13/21  4:25  PM  Result Value Ref Range   Sodium 137 135 - 145 mmol/L   Potassium 4.0 3.5 - 5.1 mmol/L   Chloride 95 (L) 98 - 111 mmol/L   CO2 32 22 - 32 mmol/L   Glucose, Bld 94 70 - 99 mg/dL    Comment: Glucose reference range applies only to samples  taken after fasting for at least 8 hours.   BUN 16 8 - 23 mg/dL   Creatinine, Ser 1.61 0.44 - 1.00 mg/dL   Calcium 9.8 8.9 - 09.6 mg/dL   Total Protein 8.1 6.5 - 8.1 g/dL   Albumin 4.8 3.5 - 5.0 g/dL   AST 23 15 - 41 U/L   ALT 14 0 - 44 U/L   Alkaline Phosphatase 67 38 - 126 U/L   Total Bilirubin 0.4 0.3 - 1.2 mg/dL   GFR, Estimated >04 >54 mL/min    Comment: (NOTE) Calculated using the CKD-EPI Creatinine Equation (2021)    Anion gap 10 5 - 15    Comment: Performed at [facility], [address] [place], [place] [address]  Blood gas, venous     Status: Abnormal (Preliminary result)   Collection Time: 11/13/21  4:25 PM  Result Value Ref Range   pH, Ven 7.33 7.25 - 7.43   pCO2, Ven 33 (L) 44 - 60 mmHg   pO2, Ven PENDING 32 - 45 mmHg   Bicarbonate 17.4 (L) 20.0 - 28.0 mmol/L   Acid-base deficit 7.5 (H) 0.0 - 2.0 mmol/L   O2 Saturation 31 %   Patient temperature 37.0    Collection site VENOUS     Comment: Performed at [facility], [address] [place], [place] [address]  Troponin I (High Sensitivity)     Status: None   Collection Time: 11/13/21  4:25 PM  Result Value Ref Range   Troponin I (High Sensitivity) 11 <18 ng/L    Comment: (NOTE) Elevated high sensitivity troponin I (hsTnI) values and significant  changes across serial measurements may suggest ACS but many other  chronic and acute conditions are known to elevate hsTnI results.  Refer to the "Links" section for chest pain algorithms and additional  guidance. Performed at [facility], [address] [place], [place] [address]   CK     Status: None   Collection Time: 11/13/21  4:25 PM  Result Value Ref Range   Total CK 207 38 - 234 U/L     Comment: Performed at [facility], [address] [place], [place] [address]  TSH     Status: None   Collection Time: 11/13/21  4:30 PM  Result Value Ref Range   TSH 2.329 0.350 - 4.500 uIU/mL    Comment: Performed by a 3rd Generation assay with a functional sensitivity of <=0.01 uIU/mL. Performed at [facility], [address] [place], [place] [address]   T4, free     Status: None   Collection Time: 11/13/21  4:30 PM  Result Value Ref Range   Free T4 0.64 0.61 - 1.12 ng/dL    Comment: (NOTE) Biotin ingestion may interfere with free T4 tests. If the results are inconsistent with the TSH level, previous test results, or the clinical presentation, then consider biotin interference. If needed, order repeat testing after stopping biotin. Performed at [facility], [address] [place], [place] [address]   Procalcitonin - Baseline     Status: None   Collection Time: 11/13/21  4:30 PM  Result Value Ref Range   Procalcitonin <0.10 ng/mL    Comment:        Interpretation: PCT (Procalcitonin) <= 0.5 ng/mL: Systemic infection (sepsis) is not likely. Local bacterial infection is possible. (NOTE)       Sepsis PCT Algorithm           Lower Respiratory Tract  Infection PCT Algorithm    ----------------------------     ----------------------------         PCT < 0.25 ng/mL                PCT < 0.10 ng/mL          Strongly encourage             Strongly discourage   discontinuation of antibiotics    initiation of antibiotics    ----------------------------     -----------------------------       PCT 0.25 - 0.50 ng/mL            PCT 0.10 - 0.25 ng/mL               OR       >80% decrease in PCT            Discourage initiation of                                            antibiotics      Encourage discontinuation           of antibiotics    ----------------------------     -----------------------------         PCT >=  0.50 ng/mL              PCT 0.26 - 0.50 ng/mL               AND        <80% decrease in PCT             Encourage initiation of                                             antibiotics       Encourage continuation           of antibiotics    ----------------------------     -----------------------------        PCT >= 0.50 ng/mL                  PCT > 0.50 ng/mL               AND         increase in PCT                  Strongly encourage                                      initiation of antibiotics    Strongly encourage escalation           of antibiotics                                     -----------------------------                                           PCT <= 0.25 ng/mL  OR                                        > 80% decrease in PCT                                      Discontinue / Do not initiate                                             antibiotics  Performed at [facility], [address] [place], [place] [address]   Urinalysis, Routine w reflex microscopic Anterior Nasal Swab     Status: Abnormal   Collection Time: 11/13/21  5:02 PM  Result Value Ref Range   Color, Urine STRAW (A) YELLOW   APPearance CLEAR (A) CLEAR   Specific Gravity, Urine 1.009 1.005 - 1.030   pH 7.0 5.0 - 8.0   Glucose, UA NEGATIVE NEGATIVE mg/dL   Hgb urine dipstick NEGATIVE NEGATIVE   Bilirubin Urine NEGATIVE NEGATIVE   Ketones, ur NEGATIVE NEGATIVE mg/dL   Protein, ur NEGATIVE NEGATIVE mg/dL   Nitrite NEGATIVE NEGATIVE   Leukocytes,Ua NEGATIVE NEGATIVE    Comment: Performed at [facility], [address], [place], [place] [address]  SARS Coronavirus 2 by RT PCR (hospital order, performed in [facility] hospital lab) *cepheid single result test* Anterior Nasal Swab     Status: Abnormal   Collection Time: 11/13/21  5:02 PM   Specimen: Anterior Nasal Swab  Result Value Ref Range   SARS Coronavirus 2 by RT PCR POSITIVE  (A) NEGATIVE    Comment: (NOTE) SARS-CoV-2 target nucleic acids are DETECTED  SARS-CoV-2 RNA is generally detectable in upper respiratory specimens  during the acute phase of infection.  Positive results are indicative  of the presence of the identified virus, but do not rule out bacterial infection or co-infection with other pathogens not detected by the test.  Clinical correlation with patient history and  other diagnostic information is necessary to determine patient infection status.  The expected result is negative.  Fact Sheet for Patients:   RoadLapTop.co.za   Fact Sheet for Healthcare Providers:   http://kim-miller.com/    This test is not yet approved or cleared by the [place] FDA and  has been authorized for detection and/or diagnosis of SARS-CoV-2 by FDA under an Emergency Use Authorization (EUA).  This EUA will remain in effect (meaning this test can be used) for the duration of  the COVID-19 declaration under Section 564(b)(1)  of the Act, 21 U.S.C. section 360-bbb-3(b)(1), unless the authorization is terminated or revoked sooner.   Performed at [facility], [address] [place], [place] [address]   Procalcitonin     Status: None   Collection Time: 11/14/21  3:46 AM  Result Value Ref Range   Procalcitonin <0.10 ng/mL    Comment:        Interpretation: PCT (Procalcitonin) <= 0.5 ng/mL: Systemic infection (sepsis) is not likely. Local bacterial infection is possible. (NOTE)       Sepsis PCT Algorithm           Lower Respiratory Tract  Infection PCT Algorithm    ----------------------------     ----------------------------         PCT < 0.25 ng/mL                PCT < 0.10 ng/mL          Strongly encourage             Strongly discourage   discontinuation of antibiotics    initiation of antibiotics    ----------------------------      -----------------------------       PCT 0.25 - 0.50 ng/mL            PCT 0.10 - 0.25 ng/mL               OR       >80% decrease in PCT            Discourage initiation of                                            antibiotics      Encourage discontinuation           of antibiotics    ----------------------------     -----------------------------         PCT >= 0.50 ng/mL              PCT 0.26 - 0.50 ng/mL               AND        <80% decrease in PCT             Encourage initiation of                                             antibiotics       Encourage continuation           of antibiotics    ----------------------------     -----------------------------        PCT >= 0.50 ng/mL                  PCT > 0.50 ng/mL               AND         increase in PCT                  Strongly encourage                                      initiation of antibiotics    Strongly encourage escalation           of antibiotics                                     -----------------------------                                           PCT <= 0.25 ng/mL  OR                                        > 80% decrease in PCT                                      Discontinue / Do not initiate                                             antibiotics  Performed at [facility], [address] [place], [place] [address]   C-reactive protein     Status: Abnormal   Collection Time: 11/14/21  3:46 AM  Result Value Ref Range   CRP 2.7 (H) <1.0 mg/dL    Comment: Performed at [facility], [address], [place], [place] [address]  Comprehensive metabolic panel     Status: Abnormal   Collection Time: 11/14/21  3:46 AM  Result Value Ref Range   Sodium 138 135 - 145 mmol/L   Potassium 3.4 (L) 3.5 - 5.1 mmol/L   Chloride 99 98 - 111 mmol/L   CO2 31 22 - 32 mmol/L   Glucose, Bld 93 70 - 99 mg/dL    Comment: Glucose reference range applies only to  samples taken after fasting for at least 8 hours.   BUN 14 8 - 23 mg/dL   Creatinine, Ser 9.62 0.44 - 1.00 mg/dL   Calcium 9.1 8.9 - 83.6 mg/dL   Total Protein 7.0 6.5 - 8.1 g/dL   Albumin 4.1 3.5 - 5.0 g/dL   AST 23 15 - 41 U/L   ALT 13 0 - 44 U/L   Alkaline Phosphatase 60 38 - 126 U/L   Total Bilirubin 0.6 0.3 - 1.2 mg/dL   GFR, Estimated >62 >94 mL/min    Comment: (NOTE) Calculated using the CKD-EPI Creatinine Equation (2021)    Anion gap 8 5 - 15    Comment: Performed at [facility], [address] [place], [place] [address]  CBC with Differential/Platelet     Status: None   Collection Time: 11/14/21  3:46 AM  Result Value Ref Range   WBC 4.6 4.0 - 10.5 K/uL   RBC 4.11 3.87 - 5.11 MIL/uL   Hemoglobin 12.5 12.0 - 15.0 g/dL   HCT 50.3 54.6 - 56.8 %   MCV 95.9 80.0 - 100.0 fL   MCH 30.4 26.0 - 34.0 pg   MCHC 31.7 30.0 - 36.0 g/dL   RDW 12.7 51.7 - 00.1 %   Platelets 213 150 - 400 K/uL   nRBC 0.0 0.0 - 0.2 %   Neutrophils Relative % 65 %   Neutro Abs 3.0 1.7 - 7.7 K/uL   Lymphocytes Relative 15 %   Lymphs Abs 0.7 0.7 - 4.0 K/uL   Monocytes Relative 14 %   Monocytes Absolute 0.6 0.1 - 1.0 K/uL   Eosinophils Relative 6 %   Eosinophils Absolute 0.3 0.0 - 0.5 K/uL   Basophils Relative 0 %   Basophils Absolute 0.0 0.0 - 0.1 K/uL   Immature Granulocytes 0 %   Abs Immature Granulocytes 0.02 0.00 - 0.07 K/uL    Comment: Performed at [facility], [address] [place], [place] [address]  D-dimer, quantitative     Status: Abnormal   Collection Time: 11/14/21  3:46 AM  Result Value Ref Range   D-Dimer, Quant 1.03 (H) 0.00 - 0.50 ug/mL-FEU    Comment: (NOTE) At the manufacturer cut-off value of 0.5 g/mL FEU, this assay has a negative predictive value of 95-100%.This assay is intended for use in conjunction with a clinical pretest probability (PTP) assessment model to exclude pulmonary embolism (PE) and deep venous thrombosis (DVT) in outpatients  suspected of PE or DVT. Results should be correlated with clinical presentation. Performed at [facility], [address] [place], [place] [address]   Ferritin     Status: None   Collection Time: 11/14/21  3:46 AM  Result Value Ref Range   Ferritin 54 11 - 307 ng/mL    Comment: Performed at [facility], [address] [place], [place] [address]    Current Facility-Administered Medications  Medication Dose Route Frequency Provider Last Rate Last Admin   acetaminophen (TYLENOL) tablet 650 mg  650 mg Oral Q6H PRN Skip Mayer A, MD   650 mg at 11/14/21 1550   albuterol (VENTOLIN HFA) 108 (90 Base) MCG/ACT inhaler 2 puff  2 puff Inhalation Q6H PRN Lurline Del, MD       amLODipine (NORVASC) tablet 5 mg  5 mg Oral Daily Skip Mayer A, MD   5 mg at 11/14/21 0844   aspirin EC tablet 81 mg  81 mg Oral Daily Skip Mayer A, MD   81 mg at 11/14/21 0846   bisoprolol (ZEBETA) tablet 5 mg  5 mg Oral BID Skip Mayer A, MD   5 mg at 11/14/21 0853   buPROPion (WELLBUTRIN XL) 24 hr tablet 150 mg  150 mg Oral Daily Skip Mayer A, MD   150 mg at 11/14/21 0847   clonazePAM (KLONOPIN) tablet 1 mg  1 mg Oral BID Skip Mayer A, MD   1 mg at 11/14/21 0847   DULoxetine (CYMBALTA) DR capsule 60 mg  60 mg Oral Daily Skip Mayer A, MD   60 mg at 11/14/21 0845   gabapentin (NEURONTIN) capsule 100 mg  100 mg Oral BID Skip Mayer A, MD   100 mg at 11/14/21 0846   haloperidol (HALDOL) tablet 2 mg  2 mg Oral Q6H PRN Lolita Patella B, MD       Or   haloperidol lactate (HALDOL) injection 2 mg  2 mg Intramuscular Q6H PRN Lolita Patella B, MD       heparin injection 5,000 Units  5,000 Units Subcutaneous Q8H Skip Mayer A, MD   5,000 Units at 11/14/21 1424   losartan (COZAAR) tablet 100 mg  100 mg Oral Daily Skip Mayer A, MD   100 mg at 11/14/21 9147   And   hydrochlorothiazide (HYDRODIURIL) tablet 12.5 mg  12.5 mg Oral Daily  Skip Mayer A, MD   12.5 mg at 11/14/21 0954   mometasone-formoterol (DULERA) 200-5 MCG/ACT inhaler 2 puff  2 puff Inhalation BID Lurline Del, MD   2 puff at 11/14/21 2001   nirmatrelvir/ritonavir EUA (PAXLOVID) 3 tablet  3 tablet Oral BID Concha Se, MD   3 tablet at 11/14/21 0954   ondansetron (ZOFRAN) tablet 4 mg  4 mg Oral Q6H PRN Lurline Del, MD       Or   ondansetron [facility]) injection 4 mg  4 mg Intravenous Q6H PRN Lurline Del, MD  polyethylene glycol (MIRALAX / GLYCOLAX) packet 17 g  17 g Oral Daily Skip Mayer A, MD       rivastigmine (EXELON) capsule 1.5 mg  1.5 mg Oral BID Skip Mayer A, MD   1.5 mg at 11/14/21 0954   Tdap (BOOSTRIX) injection 0.5 mL  0.5 mL Intramuscular Once Concha Se, MD       Current Outpatient Medications  Medication Sig Dispense Refill   ADVAIR DISKUS 250-50 MCG/ACT AEPB Inhale 1 puff into the lungs 2 (two) times daily.     amLODipine (NORVASC) 5 MG tablet Take 5 mg by mouth daily.     aspirin EC 81 MG tablet Take 81 mg by mouth daily.     bisoprolol (ZEBETA) 5 MG tablet Take 5 mg by mouth 2 (two) times daily.     buPROPion (WELLBUTRIN XL) 150 MG 24 hr tablet Take 150 mg by mouth daily.     clonazePAM (KLONOPIN) 1 MG tablet Take 1 mg by mouth 2 (two) times daily.     DULoxetine (CYMBALTA) 60 MG capsule Take 60 mg by mouth daily.     gabapentin (NEURONTIN) 100 MG capsule Take 100 mg by mouth 2 (two) times daily.     losartan-hydrochlorothiazide (HYZAAR) 100-12.5 MG tablet Take 1 tablet by mouth daily.     OXcarbazepine (TRILEPTAL) 150 MG tablet 1 po qam qnd 2 po qhs     polyethylene glycol (MIRALAX / GLYCOLAX) 17 g packet Take 17 g by mouth daily.     rivastigmine (EXELON) 1.5 MG capsule Take 1.5 mg by mouth 2 (two) times daily.     Acetaminophen Extra Strength 500 MG TABS Take 1 tablet by mouth every 4 (four) hours as needed.     albuterol (VENTOLIN HFA) 108 (90 Base) MCG/ACT inhaler Inhale 2 puffs into  the lungs every 6 (six) hours as needed for wheezing or shortness of breath.      Musculoskeletal: Strength & Muscle Tone: decreased Gait & Station:  Did not observe Patient leans: N/A            Psychiatric Specialty Exam:  Presentation  General Appearance: Appropriate for Environment Eye Contact:Good Speech:Clear and Coherent Speech Volume:Normal Handedness:No data recorded  Mood and Affect  Mood:Euthymic (stated "Ok") Affect:Congruent  Thought Process  Thought Processes:No data recorded Descriptions of Associations:No data recorded Orientation:Partial  Thought Content:-- (Patient has dementia, coherent at times)  History of Schizophrenia/Schizoaffective disorder:No data recorded Duration of Psychotic Symptoms:No data recorded Hallucinations:No data recorded Ideas of Reference:No data recorded Suicidal Thoughts:No data recorded Homicidal Thoughts:No data recorded  Sensorium  Memory:Immediate Poor Judgment:Impaired Insight:Poor  Executive Functions  Concentration:Fair Attention Span:Fair Recall:Poor Fund of Knowledge:Poor Language:Fair  Psychomotor Activity  Psychomotor Activity:Psychomotor Activity: Normal  Assets  Assets:No data recorded  Sleep  Sleep:No data recorded  Physical Exam: Physical Exam Vitals and nursing note reviewed.  HENT:     Head: Normocephalic.  Eyes:     General:        Right eye: No discharge.        Left eye: No discharge.  Cardiovascular:     Rate and Rhythm: Normal rate.  Pulmonary:     Comments: Some work of breathing Neurological:     Mental Status: She is disoriented.  Psychiatric:        Attention and Perception: Attention normal.        Mood and Affect: Affect is blunt.        Speech: Speech normal.  Behavior: Behavior is cooperative.        Cognition and Memory: Cognition is impaired. She exhibits impaired recent memory.        Judgment: Judgment is impulsive.     Comments: Dementia, oriented  to self,     Review of Systems  Reason unable to perform ROS: dementia.  Musculoskeletal:        Leg pain   Psychiatric/Behavioral:  Positive for depression (hx of). Negative for hallucinations. The patient is nervous/anxious (objectively).    Blood pressure (!) 132/45, pulse 63, temperature 98.5 F (36.9 C), temperature source Oral, resp. rate (!) 24, height 5\' 8"  (1.727 m), weight 75 kg, SpO2 95 %. Body mass index is 25.14 kg/m.  Treatment Plan Summary: Recommend to keep IVC and reassess for as needed in the morning.  Discussed with Dr. Georgeann OppenheimSreenath via secure chat, he states he can release the IVC tomorrow if appropriate.  Disposition:  See treatment plan  Vanetta MuldersLouise F Emmajo Bennette, NP 11/14/2021 8:05 PM

## 2021-11-14 NOTE — Progress Notes (Signed)
Brief hospitalist update note.  This is a nonbillable note.  Please see same-day H&P from Dr. Maisie Fus for full billable details.  Briefly, this is an 81 year old female brought to the emergency room for agitation superimposed on delirium.  Found to be COVID-positive.  Started on Paxlovid.  Otherwise hemodynamically stable however was combative and agitated in ED.  Placed under IVC by EDP.  Patient is calm and not trying to leave on my evaluation.  She is appropriate for floor status.  Psychiatry consult requested for evaluation of agitation and consideration to lift IVC.  Lolita Patella MD

## 2021-11-14 NOTE — ED Notes (Signed)
Patient complaining of HA.  Medicated with Tylenol for temperature.  Patient cooperative and took all prescribed oral medications at this time

## 2021-11-14 NOTE — ED Notes (Signed)
Patient noted to have oxygen saturations between 89-90% on room air while sleeping.  Placed on 2 L [place]

## 2021-11-14 NOTE — ED Notes (Signed)
PT IVC PENDING DISPOSITION

## 2021-11-14 NOTE — ED Notes (Addendum)
Patient resting in bed, resp even, unlabored on RA. Sinus bradycardia/sinus rhythm on monitor 47-60.  Awakens easily to verbal stimuli. Oriented to self, but pleasantly confused to time, place and situation. Calm and cooperative with care. Off going RN report patient was calm and cooperative throughout the day shift. No distress noted at this time.

## 2021-11-14 NOTE — ED Notes (Signed)
Patient resting quietly with eyes closed.  Respirations even and unlabored.  Sitter remains at bedside

## 2021-11-14 NOTE — H&P (Signed)
History and Physical    Samantha Carpenter TML:[id number] DOB: May 05, 1940 DOA: 11/13/2021  PCP: Marguarite Arbour, MD  Patient coming from: NH  I have personally briefly reviewed patient's old medical records in [facility]  Chief Complaint: combativeness in setting of dementia with behavioral features  HPI: Samantha Carpenter is a 81 y.o. 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 Of note history is limited due patient dementia. Patient currently sleeping but does awake and mumble no to limited ros. Denies sob, cough /chest pain/ n/v or pain. However sitter states patient has complaint of HA earlier.  ED Course:  IN ED patient on evaluation was noted to be + for COVID- noted lowest sat of 91 % initially was 99% per ED MD.  Patient was started on Paxlovid.  Patient  in ED was noted to have continued combativeness and had difficulty with redirection. Patient also attempted to abscond  for the ED. Due to this patient was placed on IVC. Patient tx with haldol with good effect. Temp 100.6, bp 178/57, hr 67, rr 18 sat 97% on ra Labs: Wbc 6, hgb 13.4, plt 246,  Na: 137, K 4, gly 94, cr 0.89,  CE 11 Ck 207 Vbg 7.33/33 Tsh 2.3 Procal <0.10 Ua: neg Covid :+ Cxr:Nad Xry pelvis: NAD KC:LEXNT rhythm  CTHIMPRESSION: 1. No acute intracranial findings. Chronic atrophy and white matter microvascular disease. 2. No cervical spine fracture.  Multilevel disc osteophytic disease. Tx tylenol/paxlovid Review of Systems: As per HPI otherwise 10 point review of systems negative.   Past Medical History:  Diagnosis Date   Chronic kidney disease    COPD (chronic obstructive pulmonary disease) (HCC)    Dementia (HCC)    Depression    Hypertension    Liver disease     Past Surgical History:  Procedure Laterality Date   ABDOMINAL HYSTERECTOMY      CHOLECYSTECTOMY     ESOPHAGOGASTRODUODENOSCOPY N/A 11/20/2019   Procedure: ESOPHAGOGASTRODUODENOSCOPY (EGD);  Surgeon: Regis Bill, MD;  Location: [facility] ENDOSCOPY;  Service: Gastroenterology;  Laterality: N/A;   face tuck       reports that she quit smoking about 12 years ago. She has never used smokeless tobacco. She reports that she does not currently use alcohol. She reports that she does not currently use drugs.  Allergies  Allergen Reactions   Morphine And Related    Latex Rash    "I break out"    Family History  Problem Relation Age of Onset   Bipolar disorder Brother    Schizophrenia Brother    Bipolar disorder Sister    Schizophrenia Sister    Drug abuse Son     Prior to Admission medications   Medication Sig Start Date End Date Taking? Authorizing Provider  ADVAIR DISKUS 250-50 MCG/ACT AEPB Inhale 1 puff into the lungs 2 (two) times daily. 10/23/21  Yes [provider]  amLODipine (NORVASC) 5 MG tablet Take 5 mg by mouth daily. 11/03/21  Yes [provider]  aspirin EC 81 MG tablet Take 81 mg by mouth daily.   Yes [provider]  bisoprolol (ZEBETA) 5 MG tablet Take 5 mg by mouth 2 (two) times daily. 02/20/21 02/20/22 Yes [provider]  buPROPion (WELLBUTRIN XL) 150 MG 24 hr tablet Take 150 mg by mouth daily. 10/27/19  Yes [provider]  clonazePAM Scarlette Calico) 1  MG tablet Take 1 mg by mouth 2 (two) times daily. 05/08/18  Yes [provider]  DULoxetine (CYMBALTA) 60 MG capsule Take 60 mg by mouth daily. 08/25/18  Yes [provider]  gabapentin (NEURONTIN) 100 MG capsule Take 100 mg by mouth 2 (two) times daily. 06/18/18  Yes [provider]  losartan-hydrochlorothiazide (HYZAAR) 100-12.5 MG tablet Take 1 tablet by mouth daily. 02/20/21  Yes [provider]  OXcarbazepine (TRILEPTAL) 150 MG tablet 1 po qam qnd 2 po qhs 06/12/18  Yes [provider]  polyethylene glycol (MIRALAX /  GLYCOLAX) 17 g packet Take 17 g by mouth daily. 10/25/21  Yes [provider]  rivastigmine (EXELON) 1.5 MG capsule Take 1.5 mg by mouth 2 (two) times daily. 03/28/20 11/13/21 Yes [provider]  Acetaminophen Extra Strength 500 MG TABS Take 1 tablet by mouth every 4 (four) hours as needed. 10/31/21   [provider]  albuterol (VENTOLIN HFA) 108 (90 Base) MCG/ACT inhaler Inhale 2 puffs into the lungs every 6 (six) hours as needed for wheezing or shortness of breath. 05/05/20   [provider]    Physical Exam: Vitals:   11/13/21 1629 11/13/21 1630 11/13/21 2252 11/14/21 0135  BP: (!) 177/75  (!) 178/57 (!) 147/94  Pulse: 65  67 69  Resp: 20  18 20   Temp: 98.1 F (36.7 C)  (!) 100.6 F (38.1 C) 99.5 F (37.5 C)  TempSrc: Oral  Oral Oral  SpO2: 99%  97% 93%  Weight:  75 kg    Height:  5\' 8"  (1.727 m)       Vitals:   11/13/21 1629 11/13/21 1630 11/13/21 2252 11/14/21 0135  BP: (!) 177/75  (!) 178/57 (!) 147/94  Pulse: 65  67 69  Resp: 20  18 20   Temp: 98.1 F (36.7 C)  (!) 100.6 F (38.1 C) 99.5 F (37.5 C)  TempSrc: Oral  Oral Oral  SpO2: 99%  97% 93%  Weight:  75 kg    Height:  5\' 8"  (1.727 m)    Constitutional: NAD, calm, sleeping ,comfortable Eyes: deferred ENMT:deferred ]  Neck: deferred Respiratory: clear to auscultation bilaterally, no wheezing, no crackles. Normal respiratory effort. No accessory muscle use.  Cardiovascular: Regular rate and rhythm, no murmurs / rubs / gallops. No extremity edema.  Abdomen: no tenderness, no masses palpated. No hepatosplenomegaly. Bowel sounds positive.  Musculoskeletal: no clubbing / cyanosis. No joint deformity upper and lower extremities. Good ROM, no contractures. Normal muscle tone.  Skin: no rashes, lesions, ulcers. No induration Neurologic: CN 2-12 grossly intact. Sensation intact, MAE x 4 Psychiatric: unable to assess, calm currently  Labs on Admission: I have personally reviewed following  labs and imaging studies  CBC: Recent Labs  Lab 11/13/21 1625  WBC 6.0  NEUTROABS 4.3  HGB 13.5  HCT 42.5  MCV 95.9  PLT 246   Basic Metabolic Panel: Recent Labs  Lab 11/13/21 1625  NA 137  K 4.0  CL 95*  CO2 32  GLUCOSE 94  BUN 16  CREATININE 0.89  CALCIUM 9.8   GFR: Estimated Creatinine Clearance: 49.2 mL/min (by C-G formula based on SCr of 0.89 mg/dL). Liver Function Tests: Recent Labs  Lab 11/13/21 1625  AST 23  ALT 14  ALKPHOS 67  BILITOT 0.4  PROT 8.1  ALBUMIN 4.8   No results for input(s): "LIPASE", "AMYLASE" in the last 168 hours. No results for input(s): "AMMONIA" in the last 168 hours. Coagulation Profile: No results  for input(s): "INR", "PROTIME" in the last 168 hours. Cardiac Enzymes: Recent Labs  Lab 11/13/21 1625  CKTOTAL 207   BNP (last 3 results) No results for input(s): "PROBNP" in the last 8760 hours. HbA1C: No results for input(s): "HGBA1C" in the last 72 hours. CBG: No results for input(s): "GLUCAP" in the last 168 hours. Lipid Profile: No results for input(s): "CHOL", "HDL", "LDLCALC", "TRIG", "CHOLHDL", "LDLDIRECT" in the last 72 hours. Thyroid Function Tests: Recent Labs    11/13/21 1630  TSH 2.329  FREET4 0.64   Anemia Panel: No results for input(s): "VITAMINB12", "FOLATE", "FERRITIN", "TIBC", "IRON", "RETICCTPCT" in the last 72 hours. Urine analysis:    Component Value Date/Time   COLORURINE STRAW (A) 11/13/2021 1702   APPEARANCEUR CLEAR (A) 11/13/2021 1702   LABSPEC 1.009 11/13/2021 1702   PHURINE 7.0 11/13/2021 1702   GLUCOSEU NEGATIVE 11/13/2021 1702   HGBUR NEGATIVE 11/13/2021 1702   BILIRUBINUR NEGATIVE 11/13/2021 1702   KETONESUR NEGATIVE 11/13/2021 1702   PROTEINUR NEGATIVE 11/13/2021 1702   NITRITE NEGATIVE 11/13/2021 1702   LEUKOCYTESUR NEGATIVE 11/13/2021 1702    Radiological Exams on Admission: DG Pelvis Portable  Result Date: 11/13/2021 CLINICAL DATA:  Unwitnessed fall. EXAM: PORTABLE PELVIS 1-2  VIEWS COMPARISON:  None Available. FINDINGS: The bones are osteopenic. There is no evidence of pelvic fracture or diastasis. No pelvic bone lesions are seen. Degenerative changes affect the lower lumbar spine. IMPRESSION: No acute fracture or dislocation identified. Electronically Signed   By: Darliss Cheney M.D.   On: 11/13/2021 17:07   DG Chest Portable 1 View  Result Date: 11/13/2021 CLINICAL DATA:  Shortness of breath. EXAM: PORTABLE CHEST 1 VIEW COMPARISON:  Chest x-ray 05/28/2019 FINDINGS: Likely small radiopaque external artifacts overlie the lateral left chest wall and right scapula. The heart size and mediastinal contours are within normal limits. Both lungs are clear. The visualized skeletal structures are unremarkable. IMPRESSION: No active disease. Electronically Signed   By: Darliss Cheney M.D.   On: 11/13/2021 17:06   CT HEAD WO CONTRAST ( )  Result Date: 11/13/2021 CLINICAL DATA:  Unwitnessed fall, altered mental status EXAM: CT HEAD WITHOUT CONTRAST CT CERVICAL SPINE WITHOUT CONTRAST TECHNIQUE: Multidetector CT imaging of the head and cervical spine was performed following the standard protocol without intravenous contrast. Multiplanar CT image reconstructions of the cervical spine were also generated. RADIATION DOSE REDUCTION: This exam was performed according to the departmental dose-optimization program which includes automated exposure control, adjustment of the mA and/or kV according to patient size and/or use of iterative reconstruction technique. COMPARISON:  None Available. FINDINGS: CT HEAD FINDINGS Brain: No acute intracranial hemorrhage. No focal mass lesion. No CT evidence of acute infarction. No midline shift or mass effect. No hydrocephalus. Basilar cisterns are patent. There are periventricular and subcortical white matter hypodensities. Generalized cortical atrophy. Vascular: No hyperdense vessel or unexpected calcification. Skull: Normal. Negative for fracture or focal  lesion. Sinuses/Orbits: Paranasal sinuses and mastoid air cells are clear. Orbits are clear. Other: None. CT CERVICAL SPINE FINDINGS Alignment: Normal alignment of the cervical vertebral bodies. Skull base and vertebrae: Normal craniocervical junction. No loss of vertebral body height or disc height. Normal facet articulation. No evidence of fracture. Soft tissues and spinal canal: No prevertebral soft tissue swelling. No perispinal or epidural hematoma. Disc levels: Bulky anterior endplate spurring from C4-C7. Associated endplate sclerosis and joint space narrowing through these levels. No acute findings. Upper chest: Clear Other: None IMPRESSION: 1. No acute intracranial findings. Chronic atrophy and white matter microvascular  disease. 2. No cervical spine fracture.  Multilevel disc osteophytic disease. Electronically Signed   By: Genevive Bi M.D.   On: 11/13/2021 17:00   CT Cervical Spine Wo Contrast  Result Date: 11/13/2021 CLINICAL DATA:  Unwitnessed fall, altered mental status EXAM: CT HEAD WITHOUT CONTRAST CT CERVICAL SPINE WITHOUT CONTRAST TECHNIQUE: Multidetector CT imaging of the head and cervical spine was performed following the standard protocol without intravenous contrast. Multiplanar CT image reconstructions of the cervical spine were also generated. RADIATION DOSE REDUCTION: This exam was performed according to the departmental dose-optimization program which includes automated exposure control, adjustment of the mA and/or kV according to patient size and/or use of iterative reconstruction technique. COMPARISON:  None Available. FINDINGS: CT HEAD FINDINGS Brain: No acute intracranial hemorrhage. No focal mass lesion. No CT evidence of acute infarction. No midline shift or mass effect. No hydrocephalus. Basilar cisterns are patent. There are periventricular and subcortical white matter hypodensities. Generalized cortical atrophy. Vascular: No hyperdense vessel or unexpected calcification.  Skull: Normal. Negative for fracture or focal lesion. Sinuses/Orbits: Paranasal sinuses and mastoid air cells are clear. Orbits are clear. Other: None. CT CERVICAL SPINE FINDINGS Alignment: Normal alignment of the cervical vertebral bodies. Skull base and vertebrae: Normal craniocervical junction. No loss of vertebral body height or disc height. Normal facet articulation. No evidence of fracture. Soft tissues and spinal canal: No prevertebral soft tissue swelling. No perispinal or epidural hematoma. Disc levels: Bulky anterior endplate spurring from C4-C7. Associated endplate sclerosis and joint space narrowing through these levels. No acute findings. Upper chest: Clear Other: None IMPRESSION: 1. No acute intracranial findings. Chronic atrophy and white matter microvascular disease. 2. No cervical spine fracture.  Multilevel disc osteophytic disease. Electronically Signed   By: Genevive Bi M.D.   On: 11/13/2021 17:00    EKG: Independently reviewed. See above  Assessment/Plan  COVID-19 infection w/o hypoxemia  -continue Paxlovid per protocol  -encourage po fluid intake  -daily monitoring of COVID labs  -dvt ppx per protocol   -supportive care with tylenol/vitamins  COPD  -not on oxygen at home -resume home inhalers   HTN Continue norvasc, zebeta, hyzaar   Hyperlipidemia -diet controled  Dementia  Depression Anxiety -resume home regimen  DVT prophylaxis: lovenox  Code Status: full Family Communication: none at bedside Disposition Plan: patient  expected to be admitted greater than 2 midnights  Consults called: n/a Admission status: med,tele   Lurline Del MD [facility]   If 7PM-7AM, please contact night-coverage [email or web address] Password [email or web address]  11/14/2021, 1:40 AM

## 2021-11-15 DIAGNOSIS — N1831 Chronic kidney disease, stage 3a: Secondary | ICD-10-CM

## 2021-11-15 DIAGNOSIS — U071 COVID-19: Secondary | ICD-10-CM | POA: Diagnosis not present

## 2021-11-15 DIAGNOSIS — R0902 Hypoxemia: Secondary | ICD-10-CM | POA: Diagnosis not present

## 2021-11-15 DIAGNOSIS — F03918 Unspecified dementia, unspecified severity, with other behavioral disturbance: Secondary | ICD-10-CM | POA: Diagnosis not present

## 2021-11-15 DIAGNOSIS — F418 Other specified anxiety disorders: Secondary | ICD-10-CM

## 2021-11-15 DIAGNOSIS — I10 Essential (primary) hypertension: Secondary | ICD-10-CM

## 2021-11-15 LAB — COMPREHENSIVE METABOLIC PANEL
ALT: 14 U/L (ref 0–44)
AST: 28 U/L (ref 15–41)
Albumin: 3.9 g/dL (ref 3.5–5.0)
Alkaline Phosphatase: 60 U/L (ref 38–126)
Anion gap: 8 (ref 5–15)
BUN: 22 mg/dL (ref 8–23)
CO2: 32 mmol/L (ref 22–32)
Calcium: 8.9 mg/dL (ref 8.9–10.3)
Chloride: 99 mmol/L (ref 98–111)
Creatinine, Ser: 0.95 mg/dL (ref 0.44–1.00)
GFR, Estimated: 60 mL/min — ABNORMAL LOW (ref >60)
Glucose, Bld: 77 mg/dL (ref 70–99)
Potassium: 4 mmol/L (ref 3.5–5.1)
Sodium: 139 mmol/L (ref 135–145)
Total Bilirubin: 0.7 mg/dL (ref 0.3–1.2)
Total Protein: 7.1 g/dL (ref 6.5–8.1)

## 2021-11-15 LAB — CBC WITH DIFFERENTIAL/PLATELET
Abs Immature Granulocytes: 0.01 10*3/uL (ref 0.00–0.07)
Basophils Absolute: 0 10*3/uL (ref 0.0–0.1)
Basophils Relative: 1 %
Eosinophils Absolute: 0.1 10*3/uL (ref 0.0–0.5)
Eosinophils Relative: 2 %
HCT: 41.3 % (ref 36.0–46.0)
Hemoglobin: 13.1 g/dL (ref 12.0–15.0)
Immature Granulocytes: 0 %
Lymphocytes Relative: 25 %
Lymphs Abs: 0.8 10*3/uL (ref 0.7–4.0)
MCH: 30.5 pg (ref 26.0–34.0)
MCHC: 31.7 g/dL (ref 30.0–36.0)
MCV: 96 fL (ref 80.0–100.0)
Monocytes Absolute: 0.6 10*3/uL (ref 0.1–1.0)
Monocytes Relative: 17 %
Neutro Abs: 1.8 10*3/uL (ref 1.7–7.7)
Neutrophils Relative %: 55 %
Platelets: 202 10*3/uL (ref 150–400)
RBC: 4.3 MIL/uL (ref 3.87–5.11)
RDW: 12.6 % (ref 11.5–15.5)
WBC: 3.3 10*3/uL — ABNORMAL LOW (ref 4.0–10.5)
nRBC: 0 % (ref 0.0–0.2)

## 2021-11-15 LAB — C-REACTIVE PROTEIN: CRP: 3.6 mg/dL — ABNORMAL HIGH (ref <1.0)

## 2021-11-15 LAB — MAGNESIUM: Magnesium: 2 mg/dL (ref 1.7–2.4)

## 2021-11-15 NOTE — ED Notes (Signed)
Keep ivc and reassess as needed this morning

## 2021-11-15 NOTE — ED Notes (Signed)
Informed RN bed assigned 

## 2021-11-15 NOTE — Progress Notes (Signed)
  Progress Note   Patient: Samantha Carpenter PPJ:[id number] DOB: 29-Mar-1940 DOA: 11/13/2021     1 DOS: the patient was seen and examined on 11/15/2021    Assessment and Plan: * COVID-19 virus infection Patient started on Paxlovid.  Hypoxia 1 pulse ox of 89% documented.  Patient was on 2 L of oxygen when I saw her.  Check pulse ox with ambulation.  Dementia with behavioral disturbance [facility]) I spoke with patient's sister on the phone that she lives at the [facility] assisted living and she started acting out.  And was brought into the emergency room.  Haldol as needed for agitation only.  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 Has a history of dementia.  She told me she got into a fight with her animals and was in jail.  I spoke with her sister and this is not true.  She is from assisted living at the [facility] and she started acting out and they brought her into the emergency room for further evaluation.  She was found to have a COVID infection.  Physical Exam: Vitals:   11/15/21 1035 11/15/21 1100 11/15/21 1118 11/15/21 1238  BP:    (!) 151/48  Pulse:   (!) 49 (!) 47  Resp:  (!) 21 15 16   Temp: 98.5 F (36.9 C)   98.4 F (36.9 C)  TempSrc: Oral   Oral  SpO2:   94% 94%  Weight:      Height:       Physical Exam HENT:     Head: Normocephalic.     Mouth/Throat:     Pharynx: No oropharyngeal exudate.  Eyes:     General: Lids are normal.     Conjunctiva/sclera: Conjunctivae normal.  Cardiovascular:     Rate and Rhythm: Normal rate and regular rhythm.     Heart sounds: Normal heart sounds, S1 normal and S2 normal.  Pulmonary:     Breath sounds: No decreased breath sounds, wheezing, rhonchi or rales.  Abdominal:     Palpations: Abdomen is soft.      Tenderness: There is no abdominal tenderness.  Musculoskeletal:     Right lower leg: No swelling.     Left lower leg: No swelling.  Skin:    General: Skin is warm.     Findings: No rash.  Neurological:     Mental Status: She is alert.     Comments: Answers some questions appropriately and some questions not appropriately     Data Reviewed: Creatinine 0.95 with a GFR of 60, CRP 3.6, white blood cell count 3.3, hemoglobin 13.1, platelet count 212  Family Communication: Updated patient's sister on the phone  Disposition: Status is: Inpatient Remains inpatient appropriate because: Patient with a COVID infection in from an assisted living.  Patient under involuntary commitment at this point as per psychiatry.  Labs to check on the assisted living's policy on COVID patients. Planned Discharge Destination: Assisted living facility    Time spent: 28 minutes  Author: , MD 11/15/2021 2:21 PM  For on call review [email or web address].

## 2021-11-15 NOTE — Assessment & Plan Note (Signed)
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

## 2021-11-15 NOTE — ED Notes (Signed)
This RN to the bathroom at this time. Pt ambulatory with standby assistance. This RN at bedside RA sat 83%, asked pt is suppose to be on supplemental O2. Pt states "yeah I think so but I don't need it, I have an inhaler I take." Unknown if patient wear O2 chronically. Pt placed on 2L , O2 increased to 97%.

## 2021-11-15 NOTE — Evaluation (Signed)
Physical Therapy Evaluation Patient Details Name: Samantha Carpenter MRN: [id number] DOB: [birth year] Today's Date: 11/15/2021  History of Present Illness  81 yo/ female with baseline dementia brought in by her ALF due to aggressive behavior; found to be Covid +.  Clinical Impression  On arrival pt in bathroom (on room air) independently cleaning up after going to the bathroom.  Sitter present but not needing to assist. PT did further in room ambulation w/o AD on room air with sats staying in the low 90s.  She showed and easy confidence and seemingly baseline impulsivity but maintained standing well and was able to do some increased challenge balance acts w/o overt LOBs (apart from eyes closed mod perturbations).  Ultimately he appears to be at or near her baseline and mobility is appropriate for return to ALF setting.  Pt pleasant and cooperative, though she did need consistent and excessive cuing t/o most of the session.      Recommendations for follow up therapy are one component of a multi-disciplinary discharge planning process, led by the attending physician.  Recommendations may be updated based on patient status, additional functional criteria and insurance authorization.  Follow Up Recommendations No PT follow up      Assistance Recommended at Discharge PRN  Patient can return home with the following  Assist for transportation;Assistance with cooking/housework    Equipment Recommendations None recommended by PT  Recommendations for Other Services       Functional Status Assessment Patient has not had a recent decline in their functional status     Precautions / Restrictions Precautions Precautions: Fall Restrictions Weight Bearing Restrictions: No      Mobility  Bed Mobility Overal bed mobility: Independent                  Transfers Overall transfer level: Independent Equipment used: None               General transfer comment: Pt with some impulsivity  but able to rise and return to sitting repeatedly t/o session w/o assist or overt safety issue    Ambulation/Gait Ambulation/Gait assistance: Modified independent (Device/Increase time) Gait Distance (Feet): 70 Feet Assistive device: None         General Gait Details: Pt up and mobile in the room arrival (sitter present but not assisting) on room air.  Further loop ambulation in room with PT w/o AD, again some impulsivity but on LOBs and confident cadence, etc  Stairs            Wheelchair Mobility    Modified Rankin (Stroke Patients Only)       Balance Overall balance assessment: Modified Independent                               Standardized Balance Assessment Standardized Balance Assessment :  (Pt was able to do heel raises and SLS with light HHA, maintain balance with eyes closed and min perterbations, did fall backward with moderate anterior perts.  Able to reach down to the floor with single UE support on solid surface)           Pertinent Vitals/Pain Pain Assessment Pain Assessment: No/denies pain    Home Living Family/patient expects to be discharged to:: Assisted living                 Home Equipment:  (reports she has used a cane occasionally)      Prior Function Prior  Level of Function : Patient poor historian/Family not available (pt report she does everything on her own, is at ALF, unsure how much the actually assist)                     Hand Dominance        Extremity/Trunk Assessment   Upper Extremity Assessment Upper Extremity Assessment: Generalized weakness;Overall [facility] for tasks assessed    Lower Extremity Assessment Lower Extremity Assessment: Generalized weakness;Overall WFL for tasks assessed       Communication   Communication: No difficulties  Cognition Arousal/Alertness: Awake/alert Behavior During Therapy: Impulsive Overall Cognitive Status: History of cognitive impairments - at baseline                                  General Comments: unsure of actual baseline but appears likely close        General Comments General comments (skin integrity, edema, etc.): Pt reports she has had PTs try to use a cane with her but "They said I don't really need it."  Pt on room air t/o the session with sats in the low 90s and no DOE or c/o SOB or excessive fatigue    Exercises     Assessment/Plan    PT Assessment Patient does not need any further PT services  PT Problem List Decreased safety awareness;Cardiopulmonary status limiting activity       PT Treatment Interventions      PT Goals (Current goals can be found in the Care Plan section)  Acute Rehab PT Goals Patient Stated Goal: go home PT Goal Formulation: All assessment and education complete, DC therapy    Frequency       Co-evaluation               AM-PAC PT "6 Clicks" Mobility  Outcome Measure Help needed turning from your back to your side while in a flat bed without using bedrails?: None Help needed moving from lying on your back to sitting on the side of a flat bed without using bedrails?: None Help needed moving to and from a bed to a chair (including a wheelchair)?: None Help needed standing up from a chair using your arms (e.g., wheelchair or bedside chair)?: None Help needed to walk in hospital room?: None Help needed climbing 3-5 steps with a railing? : None 6 Click Score: 24    End of Session   Activity Tolerance: Patient tolerated treatment well Patient left: in chair;with nursing/sitter in room Nurse Communication: Mobility status (SpO2 in 90s on room air with activity) PT Visit Diagnosis: Unsteadiness on feet (R26.81)    Time: 1308-6578 PT Time Calculation (min) (ACUTE ONLY): 25 min   Charges:   PT Evaluation $PT Eval Low Complexity: 1 Low          Malachi Pro, DPT 11/15/2021, 4:19 PM

## 2021-11-15 NOTE — ED Notes (Signed)
Breakfast meal tray given at this time.  

## 2021-11-15 NOTE — Consult Note (Signed)
Client is calm and cooperative this morning with no threats to herself or others.  She may have sundowning in the afternoons.  Recommend keeping the IVC in place for this reason during her medical stay.  Once she is medically cleared, it can be released.  She does not warrant a psych admission.  Nanine Means, PMHNP

## 2021-11-15 NOTE — Progress Notes (Signed)
Pt refusing to wear tele monitor. Cliffton Asters, NPP notified.

## 2021-11-15 NOTE — Assessment & Plan Note (Addendum)
Patient on Norvasc, losartan HCT and Zebeta

## 2021-11-15 NOTE — TOC Initial Note (Addendum)
Transition of Care [facility]) - Initial/Assessment Note    Patient Details  Name: Samantha Carpenter MRN: [id number] Date of Birth: 03-19-1941  [facility] CM/SW Contact:    Chapman Fitch, RN Phone Number: 11/15/2021, 2:09 PM  Clinical Narrative:                 Patient A&O x2 Admitted from the [facility] Currently under IVC Message left for Dustin at the [facility] to determine what level of care patient is from Called Patients sister and niece to complete assessment.  Unable to leave VM for either of them Patient currently on 2l O2, need to determine if this is acute Covid positive PT pending   Received return call from [facility]. Patient can return at discharge.  Patient is from Memory care. No DME at the facility.  If any required will need to be arranged prior to discharge  Expected Discharge Plan: Assisted Living Barriers to Discharge: Continued Medical Work up   Patient Goals and CMS Choice        Expected Discharge Plan and Services Expected Discharge Plan: Assisted Living       Living arrangements for the past 2 months: Assisted Living Facility                                      Prior Living Arrangements/Services Living arrangements for the past 2 months: Assisted Living Facility Lives with:: Facility Resident                   Activities of Daily Living Home Assistive Devices/Equipment: Environmental consultant (specify type) ADL Screening (condition at time of admission) Patient's cognitive ability adequate to safely complete daily activities?: Yes Is the patient deaf or have difficulty hearing?: No Does the patient have difficulty seeing, even when wearing glasses/contacts?: No Does the patient have difficulty concentrating, remembering, or making decisions?: Yes Patient able to express need for assistance with ADLs?: Yes Does the patient have difficulty dressing or bathing?: No Independently performs ADLs?: Yes (appropriate for developmental age) Does the  patient have difficulty walking or climbing stairs?: No Weakness of Legs: None Weakness of Arms/Hands: None  Permission Sought/Granted                  Emotional Assessment              Admission diagnosis:  Agitation [R45.1] Fall, initial encounter [W19.XXXA] COVID-19 [U07.1] Patient Active Problem List   Diagnosis Date Noted   COVID-19 11/14/2021   Right rib fracture 11/09/2020   Fall at home, initial encounter 11/09/2020   COPD (chronic obstructive pulmonary disease) (HCC) 11/09/2020   Depression with anxiety 11/09/2020   Elevated troponin 11/09/2020   Acute respiratory failure with hypoxia (HCC) 11/09/2020   HTN (hypertension) 11/09/2020   Abrasions of multiple sites    Pure hypercholesterolemia 06/09/2018   Recurrent major depressive disorder, 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 [place] [address] Phone: [phone] Fax: [phone]     Social Determinants of Health (SDOH) Interventions    Readmission Risk Interventions     No data to display

## 2021-11-15 NOTE — Assessment & Plan Note (Addendum)
Patient is on Wellbutrin

## 2021-11-15 NOTE — Assessment & Plan Note (Signed)
1 pulse ox of 89% documented.  Patient was on 2 L of oxygen when I saw her.  Check pulse ox with ambulation.

## 2021-11-15 NOTE — ED Notes (Signed)
Patient very pleasant, calm and cooperative with care. No distress noted at this time.

## 2021-11-15 NOTE — Assessment & Plan Note (Signed)
I spoke with patient's sister on the phone that she lives at the [facility] assisted living and she started acting out.  And was brought into the emergency room.  Haldol as needed for agitation only.

## 2021-11-15 NOTE — Assessment & Plan Note (Signed)
Patient started on Paxlovid.

## 2021-11-16 DIAGNOSIS — N1831 Chronic kidney disease, stage 3a: Secondary | ICD-10-CM | POA: Diagnosis not present

## 2021-11-16 DIAGNOSIS — U071 COVID-19: Secondary | ICD-10-CM | POA: Diagnosis not present

## 2021-11-16 DIAGNOSIS — F03918 Unspecified dementia, unspecified severity, with other behavioral disturbance: Secondary | ICD-10-CM | POA: Diagnosis not present

## 2021-11-16 DIAGNOSIS — R0902 Hypoxemia: Secondary | ICD-10-CM | POA: Diagnosis not present

## 2021-11-16 LAB — CBC WITH DIFFERENTIAL/PLATELET
Abs Immature Granulocytes: 0.01 10*3/uL (ref 0.00–0.07)
Basophils Absolute: 0 10*3/uL (ref 0.0–0.1)
Basophils Relative: 1 %
Eosinophils Absolute: 0 10*3/uL (ref 0.0–0.5)
Eosinophils Relative: 1 %
HCT: 44 % (ref 36.0–46.0)
Hemoglobin: 14 g/dL (ref 12.0–15.0)
Immature Granulocytes: 0 %
Lymphocytes Relative: 38 %
Lymphs Abs: 1.3 10*3/uL (ref 0.7–4.0)
MCH: 30.6 pg (ref 26.0–34.0)
MCHC: 31.8 g/dL (ref 30.0–36.0)
MCV: 96.3 fL (ref 80.0–100.0)
Monocytes Absolute: 0.4 10*3/uL (ref 0.1–1.0)
Monocytes Relative: 11 %
Neutro Abs: 1.6 10*3/uL — ABNORMAL LOW (ref 1.7–7.7)
Neutrophils Relative %: 49 %
Platelets: 226 10*3/uL (ref 150–400)
RBC: 4.57 MIL/uL (ref 3.87–5.11)
RDW: 12.5 % (ref 11.5–15.5)
WBC: 3.3 10*3/uL — ABNORMAL LOW (ref 4.0–10.5)
nRBC: 0 % (ref 0.0–0.2)

## 2021-11-16 LAB — COMPREHENSIVE METABOLIC PANEL
ALT: 15 U/L (ref 0–44)
AST: 34 U/L (ref 15–41)
Albumin: 4.5 g/dL (ref 3.5–5.0)
Alkaline Phosphatase: 58 U/L (ref 38–126)
Anion gap: 11 (ref 5–15)
BUN: 27 mg/dL — ABNORMAL HIGH (ref 8–23)
CO2: 29 mmol/L (ref 22–32)
Calcium: 9.3 mg/dL (ref 8.9–10.3)
Chloride: 98 mmol/L (ref 98–111)
Creatinine, Ser: 1.07 mg/dL — ABNORMAL HIGH (ref 0.44–1.00)
GFR, Estimated: 52 mL/min — ABNORMAL LOW (ref >60)
Glucose, Bld: 179 mg/dL — ABNORMAL HIGH (ref 70–99)
Potassium: 4 mmol/L (ref 3.5–5.1)
Sodium: 138 mmol/L (ref 135–145)
Total Bilirubin: 0.4 mg/dL (ref 0.3–1.2)
Total Protein: 7.8 g/dL (ref 6.5–8.1)

## 2021-11-16 LAB — MAGNESIUM: Magnesium: 1.9 mg/dL (ref 1.7–2.4)

## 2021-11-16 LAB — C-REACTIVE PROTEIN: CRP: 1.4 mg/dL — ABNORMAL HIGH (ref <1.0)

## 2021-11-16 MED ORDER — NIRMATRELVIR/RITONAVIR (PAXLOVID)TABLET: 3.0000 | ORAL_TABLET | Freq: Two times a day (BID) | ORAL | 0 refills | Status: AC

## 2021-11-16 MED ORDER — BISOPROLOL FUMARATE 5 MG PO TABS: 5.0000 mg | ORAL_TABLET | Freq: Every day | ORAL | 0 refills | Status: DC

## 2021-11-16 MED ORDER — AMLODIPINE BESYLATE 10 MG PO TABS
10.0000 mg | ORAL_TABLET | Freq: Every day | ORAL | Status: DC
  Administered 2021-11-16: 10 mg via ORAL
  Filled 2021-11-16: qty 1

## 2021-11-16 MED ORDER — BISOPROLOL FUMARATE 5 MG PO TABS
5.0000 mg | ORAL_TABLET | Freq: Every day | ORAL | Status: DC
  Administered 2021-11-16: 5 mg via ORAL

## 2021-11-16 MED ORDER — AMLODIPINE BESYLATE 10 MG PO TABS: 10.0000 mg | ORAL_TABLET | Freq: Every day | ORAL | 0 refills | Status: DC

## 2021-11-16 MED ORDER — HALOPERIDOL 2 MG PO TABS: 2.0000 mg | ORAL_TABLET | Freq: Four times a day (QID) | ORAL | 0 refills | Status: DC | PRN

## 2021-11-16 MED ORDER — CLONAZEPAM 1 MG PO TABS: 1.0000 mg | ORAL_TABLET | Freq: Two times a day (BID) | ORAL | 0 refills | Status: DC

## 2021-11-16 NOTE — Progress Notes (Signed)
Pulse oximetry on room air is 98% at rest and pt desats to 88-89% on RA with ambulation, provider notified.

## 2021-11-16 NOTE — Progress Notes (Signed)
SATURATION QUALIFICATIONS: (This note is used to comply with regulatory documentation for home oxygen)  Patient Saturations on Room Air at Rest = 98%  Patient Saturations on Room Air while Ambulating = 93%   

## 2021-11-16 NOTE — Plan of Care (Addendum)
Patient Samantha Carpenter, RA, takes meds whole, ambulates independently. Cleared for discharge, all belongings gathered. Facility will be picking up patient to return to [facility]. Report called to Selz at [facility] and given to [employer], Librarian, academic. Problem: Education: Goal: Knowledge of risk factors and measures for prevention of condition will improve Outcome: Adequate for Discharge   Problem: Coping: Goal: Psychosocial and spiritual needs will be supported Outcome: Adequate for Discharge   Problem: Respiratory: Goal: Will maintain a patent airway Outcome: Adequate for Discharge Goal: Complications related to the disease process, condition or treatment will be avoided or minimized Outcome: Adequate for Discharge   Problem: Education: Goal: Knowledge of General Education information will improve Description: Including pain rating scale, medication(s)/side effects and non-pharmacologic comfort measures Outcome: Adequate for Discharge   Problem: Health Behavior/Discharge Planning: Goal: Ability to manage health-related needs will improve Outcome: Adequate for Discharge   Problem: Clinical Measurements: Goal: Ability to maintain clinical measurements within normal limits will improve Outcome: Adequate for Discharge Goal: Will remain free from infection Outcome: Adequate for Discharge Goal: Diagnostic test results will improve Outcome: Adequate for Discharge Goal: Respiratory complications will improve Outcome: Adequate for Discharge Goal: Cardiovascular complication will be avoided Outcome: Adequate for Discharge   Problem: Activity: Goal: Risk for activity intolerance will decrease Outcome: Adequate for Discharge   Problem: Nutrition: Goal: Adequate nutrition will be maintained Outcome: Adequate for Discharge   Problem: Coping: Goal: Level of anxiety will decrease Outcome: Adequate for Discharge   Problem: Elimination: Goal: Will not experience complications  related to bowel motility Outcome: Adequate for Discharge Goal: Will not experience complications related to urinary retention Outcome: Adequate for Discharge   Problem: Pain Managment: Goal: General experience of comfort will improve Outcome: Adequate for Discharge   Problem: Safety: Goal: Ability to remain free from injury will improve Outcome: Adequate for Discharge   Problem: Skin Integrity: Goal: Risk for impaired skin integrity will decrease Outcome: Adequate for Discharge

## 2021-11-16 NOTE — NC FL2 (Signed)
[place] MEDICAID FL2 LEVEL OF CARE SCREENING TOOL     IDENTIFICATION  Patient Name: Samantha Carpenter Birthdate: Nov 17, 1940 Sex: female Admission Date (Current Location): 11/13/2021  [facility] and [place] Number:  Chiropodist and Address:  [facility], [address] [place], [place] [address]      Provider Number: [id number]  Attending Physician Name and Address:  Alford Highland, MD  Relative Name and Phone Number:       Current Level of Care: Hospital Recommended Level of Care: Memory Care ([facility] [address]) Prior Approval Number:    Date Approved/Denied:   PASRR Number:    Discharge Plan: Other (Comment) ([facility])    Current Diagnoses: Patient Active Problem List   Diagnosis Date Noted   Dementia with behavioral disturbance (HCC) 11/15/2021   Hypoxia 11/15/2021   COVID-19 virus infection 11/14/2021   Right rib fracture 11/09/2020   Fall at home, initial encounter 11/09/2020   COPD (chronic obstructive pulmonary disease) (HCC) 11/09/2020   Depression with anxiety 11/09/2020   Elevated troponin 11/09/2020   Acute respiratory failure with hypoxia (HCC) 11/09/2020   HTN (hypertension) 11/09/2020   Abrasions of multiple sites    Pure hypercholesterolemia 06/09/2018   Recurrent major depressive disorder, 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   HTN, goal below 140/80 04/28/2018   CKD (chronic kidney disease) stage 3, GFR 30-59 ml/min (HCC) 04/28/2018    Orientation RESPIRATION BLADDER Height & Weight     Self, Place  Normal Continent Weight: 165 lb 5.5 oz (75 kg) Height:  5\' 8"  (172.7 cm)  BEHAVIORAL SYMPTOMS/MOOD NEUROLOGICAL BOWEL NUTRITION STATUS   (Calm, cooperative.)   Continent Diet (Heart healthy)  AMBULATORY STATUS COMMUNICATION OF NEEDS Skin   Independent (Modified) Verbally Bruising                       Personal Care Assistance Level of  Assistance              Functional Limitations Info  Sight, Hearing, Speech Sight Info: Adequate Hearing Info: Adequate Speech Info: Adequate    SPECIAL CARE FACTORS FREQUENCY                       Contractures Contractures Info: Not present    Additional Factors Info  Code Status, Allergies Code Status Info: Full code Allergies Info: Morphine and related, Latex           Current Medications (11/16/2021):  This is the current hospital active medication list Current Facility-Administered Medications  Medication Dose Route Frequency Provider Last Rate Last Admin   acetaminophen (TYLENOL) tablet 650 mg  650 mg Oral Q6H PRN 11/18/2021 A, MD   650 mg at 11/14/21 1550   albuterol (VENTOLIN HFA) 108 (90 Base) MCG/ACT inhaler 2 puff  2 puff Inhalation Q6H PRN 11/16/21, MD       amLODipine (NORVASC) tablet 10 mg  10 mg Oral Daily Lurline Del, MD   10 mg at 11/16/21 1021   aspirin EC tablet 81 mg  81 mg Oral Daily 11/18/21 A, MD   81 mg at 11/16/21 1021   bisoprolol (ZEBETA) tablet 5 mg  5 mg Oral Daily 11/18/21, MD   5 mg at 11/16/21 1022   buPROPion (WELLBUTRIN XL) 24 hr tablet 150 mg  150 mg Oral Daily 11/18/21, MD   150 mg  at 11/16/21 1021   clonazePAM (KLONOPIN) tablet 1 mg  1 mg Oral BID Skip Mayer A, MD   1 mg at 11/16/21 1021   DULoxetine (CYMBALTA) DR capsule 60 mg  60 mg Oral Daily Skip Mayer A, MD   60 mg at 11/16/21 1022   gabapentin (NEURONTIN) capsule 100 mg  100 mg Oral BID Skip Mayer A, MD   100 mg at 11/16/21 1021   haloperidol (HALDOL) tablet 2 mg  2 mg Oral Q6H PRN Lolita Patella B, MD       Or   haloperidol lactate (HALDOL) injection 2 mg  2 mg Intramuscular Q6H PRN Lolita Patella B, MD       heparin injection 5,000 Units  5,000 Units Subcutaneous Q8H Skip Mayer A, MD   5,000 Units at 11/15/21 1312   losartan (COZAAR) tablet 100 mg  100 mg Oral Daily Skip Mayer A, MD    100 mg at 11/16/21 1021   And   hydrochlorothiazide (HYDRODIURIL) tablet 12.5 mg  12.5 mg Oral Daily Skip Mayer A, MD   12.5 mg at 11/16/21 1021   mometasone-formoterol (DULERA) 200-5 MCG/ACT inhaler 2 puff  2 puff Inhalation BID Lurline Del, MD   2 puff at 11/16/21 1021   nirmatrelvir/ritonavir EUA (PAXLOVID) 3 tablet  3 tablet Oral BID Concha Se, MD   3 tablet at 11/16/21 1025   ondansetron (ZOFRAN) tablet 4 mg  4 mg Oral Q6H PRN Lurline Del, MD       Or   ondansetron [facility]) injection 4 mg  4 mg Intravenous Q6H PRN Lurline Del, MD       polyethylene glycol (MIRALAX / GLYCOLAX) packet 17 g  17 g Oral Daily Skip Mayer A, MD   17 g at 11/16/21 1022   rivastigmine (EXELON) capsule 1.5 mg  1.5 mg Oral BID Skip Mayer A, MD   1.5 mg at 11/16/21 1021   Tdap (BOOSTRIX) injection 0.5 mL  0.5 mL Intramuscular Once Concha Se, MD         Discharge Medications: TAKE these medications     Acetaminophen Extra Strength 500 MG Tabs Take 1 tablet by mouth every 4 (four) hours as needed.    Advair Diskus 250-50 MCG/ACT Aepb Generic drug: fluticasone-salmeterol Inhale 1 puff into the lungs 2 (two) times daily.    albuterol 108 (90 Base) MCG/ACT inhaler Commonly known as: VENTOLIN HFA Inhale 2 puffs into the lungs every 6 (six) hours as needed for wheezing or shortness of breath.    amLODipine 10 MG tablet Commonly known as: NORVASC Take 1 tablet (10 mg total) by mouth daily. Start taking on: November 17, 2021 What changed:  medication strength how much to take    aspirin EC 81 MG tablet Take 81 mg by mouth daily.    bisoprolol 5 MG tablet Commonly known as: ZEBETA Take 1 tablet (5 mg total) by mouth daily. Start taking on: November 17, 2021 What changed: when to take this    buPROPion 150 MG 24 hr tablet Commonly known as: WELLBUTRIN XL Take 150 mg by mouth daily.    clonazePAM 1 MG tablet Commonly known as: KLONOPIN Take 1  tablet (1 mg total) by mouth 2 (two) times daily.    DULoxetine 60 MG capsule Commonly known as: CYMBALTA Take 60 mg by mouth daily.    gabapentin 100 MG capsule Commonly known as: NEURONTIN Take 100 mg by mouth 2 (two) times daily.  haloperidol 2 MG tablet Commonly known as: HALDOL Take 1 tablet (2 mg total) by mouth every 6 (six) hours as needed for agitation.    losartan-hydrochlorothiazide 100-12.5 MG tablet Commonly known as: HYZAAR Take 1 tablet by mouth daily.    nirmatrelvir/ritonavir EUA 20 x 150 MG & 10 x 100MG  Tabs Commonly known as: PAXLOVID Take 3 tablets by mouth 2 (two) times daily for 5 days. Patient GFR is 52. Take nirmatrelvir (150 mg) two tablets twice daily for 5 days and ritonavir (100 mg) one tablet twice daily for 5 days.    OXcarbazepine 150 MG tablet Commonly known as: TRILEPTAL 1 po qam qnd 2 po qhs    polyethylene glycol 17 g packet Commonly known as: MIRALAX / GLYCOLAX Take 17 g by mouth daily.    rivastigmine 1.5 MG capsule Commonly known as: EXELON Take 1.5 mg by mouth 2 (two) times daily.    Relevant Imaging Results:  Relevant Lab Results:   Additional Information SS#: [id number]  Candie Chroman, LCSW

## 2021-11-16 NOTE — Progress Notes (Signed)
Pt pulled out her IV. 

## 2021-11-16 NOTE — TOC Transition Note (Signed)
Transition of Care [facility]) - CM/SW Discharge Note   Patient Details  Name: Samantha Carpenter MRN: [id number] Date of Birth: 12/24/1940  [facility]) CM/SW Contact:  Margarito Liner, LCSW Phone Number: 11/16/2021, 1:13 PM   Clinical Narrative:  Patient has orders to discharge back to [facility] today. Discharge paperwork has been sent to [facility], administrator at the facility. RN will call report to [phone]. Facility hoping to pick her up within the next hour. Per charge nurse, patient walking around the room for 10 minutes on room air and saturations were at 93%. No further concerns. CSW signing off.   Final next level of care: Memory Care Barriers to Discharge: Barriers Resolved   Patient Goals and CMS Choice        Discharge Placement                Patient to be transferred to facility by: Facility Name of family member notified: Aaron Mose Patient and family notified of of transfer: 11/16/21  Discharge Plan and Services                                     Social Determinants of Health (SDOH) Interventions     Readmission Risk Interventions     No data to display

## 2021-11-16 NOTE — TOC Progression Note (Addendum)
Transition of Care [facility]) - Progression Note    Patient Details  Name: Shahd Occhipinti MRN: [id number] Date of Birth: 1940-10-16  [facility]) CM/SW Contact  Margarito Liner, LCSW Phone Number: 11/16/2021, 9:57 AM  Clinical Narrative:  Left voicemail for Dustin at [facility] to see if they will be able to transport patient at discharge.   10:01 am: Received call back from [place]. They can transport but their services end at 3:00. Sent secure chat to MD and RN to notify.  12:49 pm: Called sister and notified her of discharge today. Faxed discharge paperwork to [facility]. Left him a voicemail asking him to call back once reviewed to confirm no changes need to be made and to let me know what time they can pick her up.  Expected Discharge Plan: Assisted Living Barriers to Discharge: Continued Medical Work up  Expected Discharge Plan and Services Expected Discharge Plan: Assisted Living       Living arrangements for the past 2 months: Assisted Living Facility                                       Social Determinants of Health (SDOH) Interventions    Readmission Risk Interventions     No data to display

## 2021-11-16 NOTE — Progress Notes (Signed)
Pt refused heparin and care.

## 2021-11-16 NOTE — Care Management Important Message (Signed)
Important Message  Patient Details  Name: Samantha Carpenter MRN: [id number] Date of Birth: Jan 10, 1941   Medicare Important Message Given:  N/A - LOS <3 / Initial given by admissions     Johnell Comings 11/16/2021, 12:45 PM

## 2021-11-16 NOTE — Discharge Summary (Signed)
Physician Discharge Summary   Patient: Samantha Carpenter MRN: [id number] DOB: 17-Jun-1940  Admit date:     11/13/2021  Discharge date: 11/16/21  Discharge Physician: Alford Highland   PCP: Marguarite Arbour, MD   Recommendations at discharge:    Follow up Dr at rehab 2 days  Discharge Diagnoses: Principal Problem:   COVID-19 virus infection Active Problems:   Hypoxia   Dementia with behavioral disturbance (HCC)   CKD (chronic kidney disease) stage 3, GFR 30-59 ml/min (HCC)   Depression with anxiety   HTN (hypertension)   Hospital Course: The patient was admitted to the hospital on 11/14/2021.  The patient was combative and sent into the emergency room for further evaluation.  She was found to be COVID-positive and started on Paxlovid 5-day course.  She will be sent home with the remainder of the course.  Her facility did have a COVID outbreak and they are willing to take her back.  The patient ambulated today and was 93% on room air this afternoon.  She was seen by psychiatry during the hospital course.  Haldol prescribed as needed for agitation only.  Assessment and Plan: * COVID-19 virus infection Patient started on Paxlovid.  The patient will complete the 5-day course of Paxlovid.  Hypoxia 1 pulse ox of 89% documented.  Patient was on 2 L of oxygen when I saw her.  This afternoon pulse ox of 93% with ambulation.  Dementia with behavioral disturbance (HCC) I spoke with patient's sister on the phone that she lives at the [facility] assisted living/memory care.  And was brought into the emergency room.  Haldol as needed for agitation only.  HTN (hypertension) Patient on Norvasc, losartan HCT and Zebeta.  Her Norvasc was increased to 10 mg and is a beta was decreased down to once a day dosing.  Depression with anxiety Patient is on Wellbutrin  CKD (chronic kidney disease) stage 3, GFR 30-59 ml/min (HCC) CKD stage IIIa with creatinine 1.07 with a GFR of 52         Consultants:  None Procedures performed: None Disposition: Memory care assisted living Diet recommendation:  Cardiac diet DISCHARGE MEDICATION: Allergies as of 11/16/2021       Reactions   Morphine And Related    Latex Rash   "I break out"        Medication List     TAKE these medications    Acetaminophen Extra Strength 500 MG Tabs Take 1 tablet by mouth every 4 (four) hours as needed.   Advair Diskus 250-50 MCG/ACT Aepb Generic drug: fluticasone-salmeterol Inhale 1 puff into the lungs 2 (two) times daily.   albuterol 108 (90 Base) MCG/ACT inhaler Commonly known as: VENTOLIN HFA Inhale 2 puffs into the lungs every 6 (six) hours as needed for wheezing or shortness of breath.   amLODipine 10 MG tablet Commonly known as: NORVASC Take 1 tablet (10 mg total) by mouth daily. Start taking on: November 17, 2021 What changed:  medication strength how much to take   aspirin EC 81 MG tablet Take 81 mg by mouth daily.   bisoprolol 5 MG tablet Commonly known as: ZEBETA Take 1 tablet (5 mg total) by mouth daily. Start taking on: November 17, 2021 What changed: when to take this   buPROPion 150 MG 24 hr tablet Commonly known as: WELLBUTRIN XL Take 150 mg by mouth daily.   clonazePAM 1 MG tablet Commonly known as: KLONOPIN Take 1 tablet (1 mg total) by mouth 2 (two) times daily.  DULoxetine 60 MG capsule Commonly known as: CYMBALTA Take 60 mg by mouth daily.   gabapentin 100 MG capsule Commonly known as: NEURONTIN Take 100 mg by mouth 2 (two) times daily.   haloperidol 2 MG tablet Commonly known as: HALDOL Take 1 tablet (2 mg total) by mouth every 6 (six) hours as needed for agitation.   losartan-hydrochlorothiazide 100-12.5 MG tablet Commonly known as: HYZAAR Take 1 tablet by mouth daily.   nirmatrelvir/ritonavir EUA 20 x 150 MG & 10 x 100MG  Tabs Commonly known as: PAXLOVID Take 3 tablets by mouth 2 (two) times daily for 5 days. Patient GFR is 52. Take nirmatrelvir (150  mg) two tablets twice daily for 5 days and ritonavir (100 mg) one tablet twice daily for 5 days.   OXcarbazepine 150 MG tablet Commonly known as: TRILEPTAL 1 po qam qnd 2 po qhs   polyethylene glycol 17 g packet Commonly known as: MIRALAX / GLYCOLAX Take 17 g by mouth daily.   rivastigmine 1.5 MG capsule Commonly known as: EXELON Take 1.5 mg by mouth 2 (two) times daily.        Follow-up Information     Idelle Crouch, MD In 2 days.   Specialty: Internal Medicine Contact information: [address] [facility]  [place] [address] [phone]                Discharge Exam: Danley Danker Weights   11/13/21 1630  Weight: 75 kg   Physical Exam HENT:     Head: Normocephalic.     Mouth/Throat:     Pharynx: No oropharyngeal exudate.  Eyes:     General: Lids are normal.     Conjunctiva/sclera: Conjunctivae normal.  Cardiovascular:     Rate and Rhythm: Normal rate and regular rhythm.     Heart sounds: Normal heart sounds, S1 normal and S2 normal.  Pulmonary:     Breath sounds: No decreased breath sounds, wheezing, rhonchi or rales.  Abdominal:     Palpations: Abdomen is soft.     Tenderness: There is no abdominal tenderness.  Musculoskeletal:     Right lower leg: No swelling.     Left lower leg: No swelling.  Skin:    General: Skin is warm.     Findings: No rash.  Neurological:     Mental Status: She is alert.     Comments: Answers some questions appropriately and some questions not appropriately      Condition at discharge: stable  The results of significant diagnostics from this hospitalization (including imaging, microbiology, ancillary and laboratory) are listed below for reference.   Imaging Studies: DG Pelvis Portable  Result Date: 11/13/2021 CLINICAL DATA:  Unwitnessed fall. EXAM: PORTABLE PELVIS 1-2 VIEWS COMPARISON:  None Available. FINDINGS: The bones are osteopenic. There is no evidence of pelvic fracture or diastasis. No pelvic  bone lesions are seen. Degenerative changes affect the lower lumbar spine. IMPRESSION: No acute fracture or dislocation identified. Electronically Signed   By: Ronney Asters M.D.   On: 11/13/2021 17:07   DG Chest Portable 1 View  Result Date: 11/13/2021 CLINICAL DATA:  Shortness of breath. EXAM: PORTABLE CHEST 1 VIEW COMPARISON:  Chest x-ray 05/28/2019 FINDINGS: Likely small radiopaque external artifacts overlie the lateral left chest wall and right scapula. The heart size and mediastinal contours are within normal limits. Both lungs are clear. The visualized skeletal structures are unremarkable. IMPRESSION: No active disease. Electronically Signed   By: Ronney Asters M.D.   On: 11/13/2021 17:06  CT HEAD WO CONTRAST ( )  Result Date: 11/13/2021 CLINICAL DATA:  Unwitnessed fall, altered mental status EXAM: CT HEAD WITHOUT CONTRAST CT CERVICAL SPINE WITHOUT CONTRAST TECHNIQUE: Multidetector CT imaging of the head and cervical spine was performed following the standard protocol without intravenous contrast. Multiplanar CT image reconstructions of the cervical spine were also generated. RADIATION DOSE REDUCTION: This exam was performed according to the departmental dose-optimization program which includes automated exposure control, adjustment of the mA and/or kV according to patient size and/or use of iterative reconstruction technique. COMPARISON:  None Available. FINDINGS: CT HEAD FINDINGS Brain: No acute intracranial hemorrhage. No focal mass lesion. No CT evidence of acute infarction. No midline shift or mass effect. No hydrocephalus. Basilar cisterns are patent. There are periventricular and subcortical white matter hypodensities. Generalized cortical atrophy. Vascular: No hyperdense vessel or unexpected calcification. Skull: Normal. Negative for fracture or focal lesion. Sinuses/Orbits: Paranasal sinuses and mastoid air cells are clear. Orbits are clear. Other: None. CT CERVICAL SPINE FINDINGS  Alignment: Normal alignment of the cervical vertebral bodies. Skull base and vertebrae: Normal craniocervical junction. No loss of vertebral body height or disc height. Normal facet articulation. No evidence of fracture. Soft tissues and spinal canal: No prevertebral soft tissue swelling. No perispinal or epidural hematoma. Disc levels: Bulky anterior endplate spurring from C4-C7. Associated endplate sclerosis and joint space narrowing through these levels. No acute findings. Upper chest: Clear Other: None IMPRESSION: 1. No acute intracranial findings. Chronic atrophy and white matter microvascular disease. 2. No cervical spine fracture.  Multilevel disc osteophytic disease. Electronically Signed   By: Genevive Bi M.D.   On: 11/13/2021 17:00   CT Cervical Spine Wo Contrast  Result Date: 11/13/2021 CLINICAL DATA:  Unwitnessed fall, altered mental status EXAM: CT HEAD WITHOUT CONTRAST CT CERVICAL SPINE WITHOUT CONTRAST TECHNIQUE: Multidetector CT imaging of the head and cervical spine was performed following the standard protocol without intravenous contrast. Multiplanar CT image reconstructions of the cervical spine were also generated. RADIATION DOSE REDUCTION: This exam was performed according to the departmental dose-optimization program which includes automated exposure control, adjustment of the mA and/or kV according to patient size and/or use of iterative reconstruction technique. COMPARISON:  None Available. FINDINGS: CT HEAD FINDINGS Brain: No acute intracranial hemorrhage. No focal mass lesion. No CT evidence of acute infarction. No midline shift or mass effect. No hydrocephalus. Basilar cisterns are patent. There are periventricular and subcortical white matter hypodensities. Generalized cortical atrophy. Vascular: No hyperdense vessel or unexpected calcification. Skull: Normal. Negative for fracture or focal lesion. Sinuses/Orbits: Paranasal sinuses and mastoid air cells are clear. Orbits are  clear. Other: None. CT CERVICAL SPINE FINDINGS Alignment: Normal alignment of the cervical vertebral bodies. Skull base and vertebrae: Normal craniocervical junction. No loss of vertebral body height or disc height. Normal facet articulation. No evidence of fracture. Soft tissues and spinal canal: No prevertebral soft tissue swelling. No perispinal or epidural hematoma. Disc levels: Bulky anterior endplate spurring from C4-C7. Associated endplate sclerosis and joint space narrowing through these levels. No acute findings. Upper chest: Clear Other: None IMPRESSION: 1. No acute intracranial findings. Chronic atrophy and white matter microvascular disease. 2. No cervical spine fracture.  Multilevel disc osteophytic disease. Electronically Signed   By: Genevive Bi M.D.   On: 11/13/2021 17:00    Microbiology: Results for orders placed or performed during the hospital encounter of 11/13/21  SARS Coronavirus 2 by RT PCR (hospital order, performed in [facility] hospital lab) *cepheid single result test* Anterior Nasal Swab  Status: Abnormal   Collection Time: 11/13/21  5:02 PM   Specimen: Anterior Nasal Swab  Result Value Ref Range Status   SARS Coronavirus 2 by RT PCR POSITIVE (A) NEGATIVE Final    Comment: (NOTE) SARS-CoV-2 target nucleic acids are DETECTED  SARS-CoV-2 RNA is generally detectable in upper respiratory specimens  during the acute phase of infection.  Positive results are indicative  of the presence of the identified virus, but do not rule out bacterial infection or co-infection with other pathogens not detected by the test.  Clinical correlation with patient history and  other diagnostic information is necessary to determine patient infection status.  The expected result is negative.  Fact Sheet for Patients:   https://www.patel.info/   Fact Sheet for Healthcare Providers:   https://hall.com/    This test is not yet approved or  cleared by the [place] FDA and  has been authorized for detection and/or diagnosis of SARS-CoV-2 by FDA under an Emergency Use Authorization (EUA).  This EUA will remain in effect (meaning this test can be used) for the duration of  the COVID-19 declaration under Section 564(b)(1)  of the Act, 21 U.S.C. section 360-bbb-3(b)(1), unless the authorization is terminated or revoked sooner.   Performed at [facility], [address] [place], [place] [address]     Labs: CBC: Recent Labs  Lab 11/13/21 1625 11/14/21 0346 11/15/21 0458 11/16/21 1045  WBC 6.0 4.6 3.3* 3.3*  NEUTROABS 4.3 3.0 1.8 1.6*  HGB 13.5 12.5 13.1 14.0  HCT 42.5 39.4 41.3 44.0  MCV 95.9 95.9 96.0 96.3  PLT 246 213 202 [id number]   Basic Metabolic Panel: Recent Labs  Lab 11/13/21 1625 11/14/21 0346 11/15/21 0458 11/16/21 1045  NA 137 138 139 138  K 4.0 3.4* 4.0 4.0  CL 95* 99 99 98  CO2 32 31 32 29  GLUCOSE 94 93 77 179*  BUN 16 14 22  27*  CREATININE 0.89 0.84 0.95 1.07*  CALCIUM 9.8 9.1 8.9 9.3  MG  --   --  2.0 1.9   Liver Function Tests: Recent Labs  Lab 11/13/21 1625 11/14/21 0346 11/15/21 0458 11/16/21 1045  AST 23 23 28  34  ALT 14 13 14 15   ALKPHOS 67 60 60 58  BILITOT 0.4 0.6 0.7 0.4  PROT 8.1 7.0 7.1 7.8  ALBUMIN 4.8 4.1 3.9 4.5   CBG: No results for input(s): "GLUCAP" in the last 168 hours.  Discharge time spent: greater than 30 minutes.  Signed: Loletha Grayer, MD [facility] 11/16/2021

## 2021-11-16 NOTE — Plan of Care (Signed)
  Problem: Respiratory: Goal: Complications related to the disease process, condition or treatment will be avoided or minimized Outcome: Progressing   

## 2021-11-29 LAB — BLOOD GAS, VENOUS
Acid-base deficit: 7.5 mmol/L — ABNORMAL HIGH (ref 0.0–2.0)
Bicarbonate: 17.4 mmol/L — ABNORMAL LOW (ref 20.0–28.0)
O2 Saturation: 31 %
Patient temperature: 37
pCO2, Ven: 33 mmHg — ABNORMAL LOW (ref 44–60)
pH, Ven: 7.33 (ref 7.25–7.43)

## 2021-12-26 ENCOUNTER — Emergency Department (HOSPITAL_COMMUNITY)
Admission: EM | Admit: 2021-12-26 | Discharge: 2021-12-26 | Disposition: A | Discharge status: Skilled Nursing Facility | Payer: Medicare HMO | Attending: Student | Admitting: Student

## 2021-12-26 ENCOUNTER — Other Ambulatory Visit: Payer: Self-pay

## 2021-12-26 ENCOUNTER — Emergency Department (HOSPITAL_COMMUNITY): Payer: Medicare HMO

## 2021-12-26 DIAGNOSIS — I10 Essential (primary) hypertension: Secondary | ICD-10-CM | POA: Diagnosis not present

## 2021-12-26 DIAGNOSIS — S0101XA Laceration without foreign body of scalp, initial encounter: Secondary | ICD-10-CM

## 2021-12-26 DIAGNOSIS — W19XXXA Unspecified fall, initial encounter: Secondary | ICD-10-CM

## 2021-12-26 DIAGNOSIS — F0390 Unspecified dementia without behavioral disturbance: Secondary | ICD-10-CM | POA: Insufficient documentation

## 2021-12-26 DIAGNOSIS — E876 Hypokalemia: Secondary | ICD-10-CM

## 2021-12-26 DIAGNOSIS — W07XXXA Fall from chair, initial encounter: Secondary | ICD-10-CM | POA: Diagnosis not present

## 2021-12-26 DIAGNOSIS — Z23 Encounter for immunization: Secondary | ICD-10-CM | POA: Diagnosis not present

## 2021-12-26 DIAGNOSIS — J449 Chronic obstructive pulmonary disease, unspecified: Secondary | ICD-10-CM | POA: Insufficient documentation

## 2021-12-26 DIAGNOSIS — S0990XA Unspecified injury of head, initial encounter: Secondary | ICD-10-CM | POA: Diagnosis present

## 2021-12-26 LAB — CBC
HCT: 31.5 % — ABNORMAL LOW (ref 36.0–46.0)
Hemoglobin: 10.4 g/dL — ABNORMAL LOW (ref 12.0–15.0)
MCH: 31 pg (ref 26.0–34.0)
MCHC: 33 g/dL (ref 30.0–36.0)
MCV: 94 fL (ref 80.0–100.0)
Platelets: 328 10*3/uL (ref 150–400)
RBC: 3.35 MIL/uL — ABNORMAL LOW (ref 3.87–5.11)
RDW: 13.3 % (ref 11.5–15.5)
WBC: 5.5 10*3/uL (ref 4.0–10.5)
nRBC: 0 % (ref 0.0–0.2)

## 2021-12-26 LAB — BASIC METABOLIC PANEL
Anion gap: 7 (ref 5–15)
BUN: 9 mg/dL (ref 8–23)
CO2: 33 mmol/L — ABNORMAL HIGH (ref 22–32)
Calcium: 8.6 mg/dL — ABNORMAL LOW (ref 8.9–10.3)
Chloride: 92 mmol/L — ABNORMAL LOW (ref 98–111)
Creatinine, Ser: 0.84 mg/dL (ref 0.44–1.00)
GFR, Estimated: >60 mL/min (ref >60)
Glucose, Bld: 161 mg/dL — ABNORMAL HIGH (ref 70–99)
Potassium: 3.3 mmol/L — ABNORMAL LOW (ref 3.5–5.1)
Sodium: 132 mmol/L — ABNORMAL LOW (ref 135–145)

## 2021-12-26 MED ORDER — ACETAMINOPHEN 325 MG PO TABS: 325.0000 mg | ORAL_TABLET | Freq: Once | ORAL | Status: DC

## 2021-12-26 MED ORDER — POTASSIUM CHLORIDE CRYS ER 20 MEQ PO TBCR
40.0000 meq | EXTENDED_RELEASE_TABLET | Freq: Once | ORAL | Status: AC
  Administered 2021-12-26: 40 meq via ORAL
  Filled 2021-12-26: qty 2

## 2021-12-26 MED ORDER — TETANUS-DIPHTH-ACELL PERTUSSIS 5-2.5-18.5 LF-MCG/0.5 IM SUSY
0.5000 mL | PREFILLED_SYRINGE | Freq: Once | INTRAMUSCULAR | Status: AC
  Administered 2021-12-26: 0.5 mL via INTRAMUSCULAR
  Filled 2021-12-26: qty 0.5

## 2021-12-26 MED ORDER — ACETAMINOPHEN 325 MG PO TABS
650.0000 mg | ORAL_TABLET | Freq: Once | ORAL | Status: AC
Start: 2021-12-26 — End: 2021-12-26
  Administered 2021-12-26: 650 mg via ORAL
  Filled 2021-12-26: qty 2

## 2021-12-26 NOTE — ED Triage Notes (Signed)
[place] ems from [facility]. Pt fell backwards in chair at dining table. No loss of consciousness.Half 1/2 inch lac on rt posterior head. Currently not bleeding.pt a&o cx 4  with ems Hx of mild dementia, copd. 81MG  ASA A DAY c/o head pain and back pain. Back pain chronic PUPILS EQUAL AND REACTIVE  BP-152/60 96-100% FLUCTUATED  HR- 50  CBG 167  FULL CODE  20 G IN LAC

## 2021-12-26 NOTE — Discharge Instructions (Addendum)
As we discussed, your work-up in the ER today was reassuring for acute findings. 3 staples were placed in your scalp today. You may shower normally with these and rinse with soap and water. Follow-up with your pcp in 7 days for removal of these staples and wound check. In the interim, please monitor for signs of infection and take tylenol as needed for pain.  Additionally, your hemoglobin and a few of your electrolytes were mildly lower than normal, you will need this rechecked when you follow-up with your pcp for staple removal.   Return if development of any new or worsening symptoms

## 2021-12-26 NOTE — ED Notes (Signed)
Pt c/o 7/10 pain at lac site. Right posterior head. Advised PA Smoot

## 2021-12-26 NOTE — ED Notes (Signed)
PTAR called at 14:31; patient 9th in line

## 2021-12-26 NOTE — ED Provider Notes (Signed)
[facility] EMERGENCY DEPARTMENT Provider Note   CSN: [id number] Arrival date & time: 12/26/21  1039     History  Chief Complaint  Patient presents with   Lytle Michaels    Samantha Carpenter is a 81 y.o. female.  Patient with history of dementia, htn, COPD presents today from [facility] facility with complaints of fall. According to an employee at her nursing facility who witnessed the fall, patient was sitting at her chair eating breakfast and leaned over to adjust the air conditioner and slipped out of her chair. She did hit her head on the ground but did not loose consciousness. She is not anticoagulated. Patient denies any complaints or pain. She is apparently ambulatory at baseline without assistance.  Level 5 caveat --- dementia  The history is provided by the patient. No language interpreter was used.  Fall       Home Medications Prior to Admission medications   Medication Sig Start Date End Date Taking? Authorizing Provider  Acetaminophen Extra Strength 500 MG TABS Take 1 tablet by mouth every 4 (four) hours as needed. 10/31/21   [provider]  ADVAIR DISKUS 250-50 MCG/ACT AEPB Inhale 1 puff into the lungs 2 (two) times daily. 10/23/21   [provider]  albuterol (VENTOLIN HFA) 108 (90 Base) MCG/ACT inhaler Inhale 2 puffs into the lungs every 6 (six) hours as needed for wheezing or shortness of breath. 05/05/20   [provider]  amLODipine (NORVASC) 10 MG tablet Take 1 tablet (10 mg total) by mouth daily. 11/17/21   Loletha Grayer, MD  aspirin EC 81 MG tablet Take 81 mg by mouth daily.    [provider]  bisoprolol (ZEBETA) 5 MG tablet Take 1 tablet (5 mg total) by mouth daily. 11/17/21   Loletha Grayer, MD  buPROPion (WELLBUTRIN XL) 150 MG 24 hr tablet Take 150 mg by mouth daily. 10/27/19   [provider]  clonazePAM (KLONOPIN) 1 MG tablet Take 1 tablet (1 mg total) by mouth 2 (two) times daily. 11/16/21    Loletha Grayer, MD  DULoxetine (CYMBALTA) 60 MG capsule Take 60 mg by mouth daily. 08/25/18   [provider]  gabapentin (NEURONTIN) 100 MG capsule Take 100 mg by mouth 2 (two) times daily. 06/18/18   [provider]  haloperidol (HALDOL) 2 MG tablet Take 1 tablet (2 mg total) by mouth every 6 (six) hours as needed for agitation. 11/16/21   Loletha Grayer, MD  losartan-hydrochlorothiazide (HYZAAR) 100-12.5 MG tablet Take 1 tablet by mouth daily. 02/20/21   [provider]  OXcarbazepine (TRILEPTAL) 150 MG tablet 1 po qam qnd 2 po qhs 06/12/18   [provider]  polyethylene glycol (MIRALAX / GLYCOLAX) 17 g packet Take 17 g by mouth daily. 10/25/21   [provider]  rivastigmine (EXELON) 1.5 MG capsule Take 1.5 mg by mouth 2 (two) times daily. 03/28/20 11/13/21  [provider]      Allergies    Morphine and related and Latex    Review of Systems   Review of Systems  Unable to perform ROS: Dementia  All other systems reviewed and are negative.   Physical Exam Updated Vital Signs BP (!) 152/54   Pulse (!) 53   Temp 97.6 F (36.4 C) (Oral)   Resp 16   Ht 5\' 8"  (1.727 m)   Wt 68.9 kg   SpO2 100%   BMI 23.11 kg/m  Physical Exam Vitals and nursing note reviewed.  Constitutional:      General: She is not in acute distress.    Appearance: Normal appearance. She is normal weight. She is not ill-appearing, toxic-appearing or diaphoretic.  HENT:     Head:     Comments: 2 cm linear laceration noted to the right parietal region of the scalp. No active bleeding or signs of foreign body. No hematoma or crepitus.   No battles sign or racoon eyes. Eyes:     Extraocular Movements: Extraocular movements intact.     Pupils: Pupils are equal, round, and reactive to light.  Neck:     Comments: No midline cervical spine tenderness Cardiovascular:     Rate and Rhythm: Normal rate and regular rhythm.     Heart sounds: Normal heart sounds.   Pulmonary:     Effort: Pulmonary effort is normal. No respiratory distress.     Breath sounds: Normal breath sounds.  Abdominal:     General: Abdomen is flat.     Palpations: Abdomen is soft.  Musculoskeletal:        General: Normal range of motion.     Cervical back: Normal range of motion and neck supple. No tenderness.     Comments: No tenderness to palpation of thoracic or lumbar spine. No stepoffs or deformity or overlying skin changes.   Skin:    General: Skin is warm and dry.  Neurological:     General: No focal deficit present.     Mental Status: She is alert.  Psychiatric:        Mood and Affect: Mood normal.        Behavior: Behavior normal.     ED Results / Procedures / Treatments   Labs (all labs ordered are listed, but only abnormal results are displayed) Labs Reviewed  CBC - Abnormal; Notable for the following components:      Result Value   RBC 3.35 (*)    Hemoglobin 10.4 (*)    HCT 31.5 (*)    All other components within normal limits  BASIC METABOLIC PANEL - Abnormal; Notable for the following components:   Sodium 132 (*)    Potassium 3.3 (*)    Chloride 92 (*)    CO2 33 (*)    Glucose, Bld 161 (*)    Calcium 8.6 (*)    All other components within normal limits    EKG None  Radiology CT Head Wo Contrast  Result Date: 12/26/2021 CLINICAL DATA:  Fall.  Head trauma. EXAM: CT HEAD WITHOUT CONTRAST CT CERVICAL SPINE WITHOUT CONTRAST TECHNIQUE: Multidetector CT imaging of the head and cervical spine was performed following the standard protocol without intravenous contrast. Multiplanar CT image reconstructions of the cervical spine were also generated. RADIATION DOSE REDUCTION: This exam was performed according to the departmental dose-optimization program which includes automated exposure control, adjustment of the mA and/or kV according to patient size and/or use of iterative reconstruction technique. COMPARISON:  11/13/2021 FINDINGS: CT HEAD FINDINGS  Brain: There is no evidence for acute hemorrhage, hydrocephalus, mass lesion, or abnormal extra-axial fluid collection. No definite CT evidence for acute infarction. Diffuse loss of parenchymal volume is consistent with atrophy. Patchy low attenuation in the deep hemispheric and periventricular white matter is nonspecific, but likely reflects chronic microvascular ischemic demyelination. Vascular: No hyperdense vessel or unexpected calcification. Skull: No evidence for fracture. No worrisome lytic or sclerotic lesion. Sinuses/Orbits: The visualized paranasal sinuses and mastoid air cells are clear. Visualized portions of the globes and intraorbital fat are unremarkable.  Other: None. CT CERVICAL SPINE FINDINGS Alignment: Straightening of normal cervical lordosis without evidence for traumatic subluxation. Skull base and vertebrae: No acute fracture. No primary bone lesion or focal pathologic process. Soft tissues and spinal canal: No prevertebral fluid or swelling. No visible canal hematoma. Disc levels: Loss of disc height noted C4-5 and C5-6. Facets are well aligned bilaterally. Upper chest: Unremarkable. Other: Non. IMPRESSION: 1. No acute intracranial abnormality. 2. Atrophy with chronic small vessel ischemic disease. 3. No cervical spine fracture or traumatic subluxation. 4. Degenerative changes in the cervical spine as above. Electronically Signed   By: Misty Stanley M.D.   On: 12/26/2021 12:13   CT Cervical Spine Wo Contrast  Result Date: 12/26/2021 CLINICAL DATA:  Fall.  Head trauma. EXAM: CT HEAD WITHOUT CONTRAST CT CERVICAL SPINE WITHOUT CONTRAST TECHNIQUE: Multidetector CT imaging of the head and cervical spine was performed following the standard protocol without intravenous contrast. Multiplanar CT image reconstructions of the cervical spine were also generated. RADIATION DOSE REDUCTION: This exam was performed according to the departmental dose-optimization program which includes automated exposure  control, adjustment of the mA and/or kV according to patient size and/or use of iterative reconstruction technique. COMPARISON:  11/13/2021 FINDINGS: CT HEAD FINDINGS Brain: There is no evidence for acute hemorrhage, hydrocephalus, mass lesion, or abnormal extra-axial fluid collection. No definite CT evidence for acute infarction. Diffuse loss of parenchymal volume is consistent with atrophy. Patchy low attenuation in the deep hemispheric and periventricular white matter is nonspecific, but likely reflects chronic microvascular ischemic demyelination. Vascular: No hyperdense vessel or unexpected calcification. Skull: No evidence for fracture. No worrisome lytic or sclerotic lesion. Sinuses/Orbits: The visualized paranasal sinuses and mastoid air cells are clear. Visualized portions of the globes and intraorbital fat are unremarkable. Other: None. CT CERVICAL SPINE FINDINGS Alignment: Straightening of normal cervical lordosis without evidence for traumatic subluxation. Skull base and vertebrae: No acute fracture. No primary bone lesion or focal pathologic process. Soft tissues and spinal canal: No prevertebral fluid or swelling. No visible canal hematoma. Disc levels: Loss of disc height noted C4-5 and C5-6. Facets are well aligned bilaterally. Upper chest: Unremarkable. Other: Non. IMPRESSION: 1. No acute intracranial abnormality. 2. Atrophy with chronic small vessel ischemic disease. 3. No cervical spine fracture or traumatic subluxation. 4. Degenerative changes in the cervical spine as above. Electronically Signed   By: Misty Stanley M.D.   On: 12/26/2021 12:13    Procedures .Marland KitchenLaceration Repair  Date/Time: 12/26/2021 1:39 PM  Performed by: Bud Face, PA-C Authorized by: Bud Face, PA-C   Consent:    Consent obtained:  Verbal   Consent given by:  Patient   Risks, benefits, and alternatives were discussed: yes     Risks discussed:  Infection, need for additional repair, nerve damage, poor  wound healing, vascular damage, tendon damage, retained foreign body, pain and poor cosmetic result   Alternatives discussed:  No treatment, delayed treatment, observation and referral Universal protocol:    Procedure explained and questions answered to patient or proxy's satisfaction: yes     Test results available: yes     Imaging studies available: yes     Patient identity confirmed:  Verbally with patient and arm band Anesthesia:    Anesthesia method:  None Laceration details:    Location:  Scalp   Scalp location:  R parietal   Length (cm):  3   Depth (mm):  1 Treatment:    Area cleansed with:  Saline   Amount of cleaning:  Standard   Irrigation solution:  Sterile saline   Irrigation volume:  500 ml   Irrigation method:  Pressure wash Skin repair:    Repair method:  Staples   Number of staples:  3 Approximation:    Approximation:  Close Repair type:    Repair type:  Simple Post-procedure details:    Dressing:  Antibiotic ointment   Procedure completion:  Tolerated well, no immediate complications     Medications Ordered in ED Medications  acetaminophen (TYLENOL) tablet 650 mg (has no administration in time range)  potassium chloride SA (KLOR-CON M) CR tablet 40 mEq (has no administration in time range)  Tdap (BOOSTRIX) injection 0.5 mL (0.5 mLs Intramuscular Given 12/26/21 1254)    ED Course/ Medical Decision Making/ A&P                           Medical Decision Making Amount and/or Complexity of Data Reviewed Labs: ordered. Radiology: ordered.   This patient presents to the ED for concern of fall, this involves an extensive number of treatment options, and is a complaint that carries with it a high risk of complications and morbidity.   Co morbidities that complicate the patient evaluation  Hx dementia, CKD, COPD, and hypertension   Additional history obtained:  Additional history obtained from epic chart review and nursing facility over the  phone  Lab Tests:  I Ordered, and personally interpreted labs.  The pertinent results include:  Hemoglobin 10.4, decreased from previous but likely unrelated to patients presentation today. Will recommend close pcp recheck. Patient is demented but does seem somewhat coherent, denies any hematochezia or melena. Low suspicion that this finding contributed to her fall. K 3.3, Na 132.    Imaging Studies ordered:  I ordered imaging studies including CT head, cervical spine  I independently visualized and interpreted imaging which showed no acute findings I agree with the radiologist interpretation   Problem List / ED Course / Critical interventions / Medication management  Fall I ordered medication including tylenol  for pain, oral potassium for hypokalemia, and tdap  Reevaluation of the patient after these medicines showed that the patient improved I have reviewed the patients home medicines and have made adjustments as needed   Social Determinants of Health:  Hx dementia from memory care facility   Test / Admission - Considered:  Patient presents today with complaints of fall. She is afebrile, non-toxic appearing, and in no acute distress with reassuring vital signs. Physical exam overall benign, small laceration noted to the scalp which has been repaired and well tolerated. Patient is able to ambulate to the bathroom and back without assistance per her baseline. Patient and her memory care facility are aware that she needs her staples removed in 7 days and wound check with hemoglobin and electrolyte recheck as well given their abnormalities. No further emergent concerns, she is stable for discharge. Educated on red flag symptoms that would prompt immediate return. Patient is understanding and amenable with plan, discharged in stable condition.    This is a shared visit with supervising physician Dr. Matilde Sprang who has independently evaluated patient & provided guidance in  evaluation/management/disposition, in agreement with care    Final Clinical Impression(s) / ED Diagnoses Final diagnoses:  Fall, initial encounter  Laceration of scalp, initial encounter  Hypokalemia    Rx / DC Orders ED Discharge Orders     None     An After Visit Summary was printed and  given to the patient.     Nestor Lewandowsky 12/26/21 1357    Teressa Lower, MD 12/26/21 1550

## 2022-01-08 ENCOUNTER — Emergency Department: Payer: Medicare HMO

## 2022-01-08 ENCOUNTER — Inpatient Hospital Stay
Admission: EM | Admit: 2022-01-08 | Discharge: 2022-01-11 | DRG: 190 | Disposition: A | Discharge status: Skilled Nursing Facility | Payer: Medicare HMO | Source: Skilled Nursing Facility | Attending: Internal Medicine | Admitting: Internal Medicine

## 2022-01-08 ENCOUNTER — Encounter: Payer: Self-pay | Admitting: Emergency Medicine

## 2022-01-08 ENCOUNTER — Other Ambulatory Visit: Payer: Self-pay

## 2022-01-08 DIAGNOSIS — N179 Acute kidney failure, unspecified: Secondary | ICD-10-CM | POA: Diagnosis not present

## 2022-01-08 DIAGNOSIS — L8990 Pressure ulcer of unspecified site, unspecified stage: Secondary | ICD-10-CM | POA: Insufficient documentation

## 2022-01-08 DIAGNOSIS — K769 Liver disease, unspecified: Secondary | ICD-10-CM | POA: Diagnosis present

## 2022-01-08 DIAGNOSIS — L89312 Pressure ulcer of right buttock, stage 2: Secondary | ICD-10-CM | POA: Diagnosis present

## 2022-01-08 DIAGNOSIS — Z91040 Latex allergy status: Secondary | ICD-10-CM

## 2022-01-08 DIAGNOSIS — F03918 Unspecified dementia, unspecified severity, with other behavioral disturbance: Secondary | ICD-10-CM | POA: Diagnosis not present

## 2022-01-08 DIAGNOSIS — I5032 Chronic diastolic (congestive) heart failure: Secondary | ICD-10-CM | POA: Diagnosis present

## 2022-01-08 DIAGNOSIS — E8770 Fluid overload, unspecified: Secondary | ICD-10-CM | POA: Diagnosis present

## 2022-01-08 DIAGNOSIS — Z7982 Long term (current) use of aspirin: Secondary | ICD-10-CM

## 2022-01-08 DIAGNOSIS — R7303 Prediabetes: Secondary | ICD-10-CM | POA: Diagnosis not present

## 2022-01-08 DIAGNOSIS — J9601 Acute respiratory failure with hypoxia: Secondary | ICD-10-CM | POA: Diagnosis present

## 2022-01-08 DIAGNOSIS — L89322 Pressure ulcer of left buttock, stage 2: Secondary | ICD-10-CM | POA: Diagnosis present

## 2022-01-08 DIAGNOSIS — N1831 Chronic kidney disease, stage 3a: Secondary | ICD-10-CM | POA: Diagnosis present

## 2022-01-08 DIAGNOSIS — E876 Hypokalemia: Secondary | ICD-10-CM | POA: Diagnosis not present

## 2022-01-08 DIAGNOSIS — F32A Depression, unspecified: Secondary | ICD-10-CM | POA: Diagnosis present

## 2022-01-08 DIAGNOSIS — Z79899 Other long term (current) drug therapy: Secondary | ICD-10-CM

## 2022-01-08 DIAGNOSIS — J441 Chronic obstructive pulmonary disease with (acute) exacerbation: Secondary | ICD-10-CM | POA: Diagnosis not present

## 2022-01-08 DIAGNOSIS — Z818 Family history of other mental and behavioral disorders: Secondary | ICD-10-CM

## 2022-01-08 DIAGNOSIS — Z87891 Personal history of nicotine dependence: Secondary | ICD-10-CM

## 2022-01-08 DIAGNOSIS — J189 Pneumonia, unspecified organism: Secondary | ICD-10-CM | POA: Diagnosis not present

## 2022-01-08 DIAGNOSIS — Z66 Do not resuscitate: Secondary | ICD-10-CM | POA: Diagnosis present

## 2022-01-08 DIAGNOSIS — E162 Hypoglycemia, unspecified: Secondary | ICD-10-CM | POA: Diagnosis not present

## 2022-01-08 DIAGNOSIS — Z885 Allergy status to narcotic agent status: Secondary | ICD-10-CM

## 2022-01-08 DIAGNOSIS — I2489 Other forms of acute ischemic heart disease: Secondary | ICD-10-CM | POA: Diagnosis present

## 2022-01-08 DIAGNOSIS — R7989 Other specified abnormal findings of blood chemistry: Secondary | ICD-10-CM | POA: Diagnosis present

## 2022-01-08 DIAGNOSIS — Z1152 Encounter for screening for COVID-19: Secondary | ICD-10-CM

## 2022-01-08 DIAGNOSIS — I10 Essential (primary) hypertension: Secondary | ICD-10-CM | POA: Diagnosis present

## 2022-01-08 DIAGNOSIS — R410 Disorientation, unspecified: Secondary | ICD-10-CM | POA: Diagnosis present

## 2022-01-08 DIAGNOSIS — I130 Hypertensive heart and chronic kidney disease with heart failure and stage 1 through stage 4 chronic kidney disease, or unspecified chronic kidney disease: Secondary | ICD-10-CM | POA: Diagnosis present

## 2022-01-08 DIAGNOSIS — Z7951 Long term (current) use of inhaled steroids: Secondary | ICD-10-CM

## 2022-01-08 DIAGNOSIS — E785 Hyperlipidemia, unspecified: Secondary | ICD-10-CM | POA: Diagnosis present

## 2022-01-08 LAB — COMPREHENSIVE METABOLIC PANEL
ALT: 13 U/L (ref 0–44)
AST: 21 U/L (ref 15–41)
Albumin: 4 g/dL (ref 3.5–5.0)
Alkaline Phosphatase: 77 U/L (ref 38–126)
Anion gap: 12 (ref 5–15)
BUN: 18 mg/dL (ref 8–23)
CO2: 32 mmol/L (ref 22–32)
Calcium: 9.3 mg/dL (ref 8.9–10.3)
Chloride: 89 mmol/L — ABNORMAL LOW (ref 98–111)
Creatinine, Ser: 0.96 mg/dL (ref 0.44–1.00)
GFR, Estimated: 59 mL/min — ABNORMAL LOW (ref >60)
Glucose, Bld: 141 mg/dL — ABNORMAL HIGH (ref 70–99)
Potassium: 3.3 mmol/L — ABNORMAL LOW (ref 3.5–5.1)
Sodium: 133 mmol/L — ABNORMAL LOW (ref 135–145)
Total Bilirubin: 0.8 mg/dL (ref 0.3–1.2)
Total Protein: 7.5 g/dL (ref 6.5–8.1)

## 2022-01-08 LAB — CBC
HCT: 37.9 % (ref 36.0–46.0)
Hemoglobin: 12.4 g/dL (ref 12.0–15.0)
MCH: 30.6 pg (ref 26.0–34.0)
MCHC: 32.7 g/dL (ref 30.0–36.0)
MCV: 93.6 fL (ref 80.0–100.0)
Platelets: 234 10*3/uL (ref 150–400)
RBC: 4.05 MIL/uL (ref 3.87–5.11)
RDW: 13.2 % (ref 11.5–15.5)
WBC: 9.9 10*3/uL (ref 4.0–10.5)
nRBC: 0 % (ref 0.0–0.2)

## 2022-01-08 LAB — BLOOD GAS, VENOUS
Acid-Base Excess: 12.9 mmol/L — ABNORMAL HIGH (ref 0.0–2.0)
Bicarbonate: 40.2 mmol/L — ABNORMAL HIGH (ref 20.0–28.0)
O2 Saturation: 70.3 %
Patient temperature: 37
pCO2, Ven: 62 mmHg — ABNORMAL HIGH (ref 44–60)
pH, Ven: 7.42 (ref 7.25–7.43)
pO2, Ven: 41 mmHg (ref 32–45)

## 2022-01-08 LAB — RESPIRATORY PANEL BY PCR

## 2022-01-08 LAB — TROPONIN I (HIGH SENSITIVITY)
Troponin I (High Sensitivity): 21 ng/L — ABNORMAL HIGH (ref <18)
Troponin I (High Sensitivity): 18 ng/L — ABNORMAL HIGH (ref <18)

## 2022-01-08 LAB — SARS CORONAVIRUS 2 BY RT PCR: SARS Coronavirus 2 by RT PCR: NEGATIVE

## 2022-01-08 LAB — GLUCOSE, CAPILLARY: Glucose-Capillary: 199 mg/dL — ABNORMAL HIGH (ref 70–99)

## 2022-01-08 LAB — PROCALCITONIN: Procalcitonin: <0.1 ng/mL

## 2022-01-08 LAB — BRAIN NATRIURETIC PEPTIDE: B Natriuretic Peptide: 260.8 pg/mL — ABNORMAL HIGH (ref 0.0–100.0)

## 2022-01-08 MED ORDER — CLONAZEPAM 1 MG PO TABS
1.0000 mg | ORAL_TABLET | Freq: Two times a day (BID) | ORAL | Status: DC
  Administered 2022-01-09 – 2022-01-11 (×4): 1 mg via ORAL
  Filled 2022-01-08 (×6): qty 1

## 2022-01-08 MED ORDER — IPRATROPIUM-ALBUTEROL 0.5-2.5 (3) MG/3ML IN SOLN
3.0000 mL | Freq: Four times a day (QID) | RESPIRATORY_TRACT | Status: DC
  Administered 2022-01-08 – 2022-01-09 (×2): 3 mL via RESPIRATORY_TRACT
  Filled 2022-01-08 (×2): qty 3

## 2022-01-08 MED ORDER — AMLODIPINE BESYLATE 10 MG PO TABS
10.0000 mg | ORAL_TABLET | Freq: Every day | ORAL | Status: DC
  Administered 2022-01-09 – 2022-01-11 (×3): 10 mg via ORAL
  Filled 2022-01-08 (×3): qty 1

## 2022-01-08 MED ORDER — OXCARBAZEPINE 150 MG PO TABS
150.0000 mg | ORAL_TABLET | Freq: Every morning | ORAL | Status: DC
  Administered 2022-01-09 – 2022-01-11 (×3): 150 mg via ORAL
  Filled 2022-01-08 (×3): qty 1

## 2022-01-08 MED ORDER — LOSARTAN POTASSIUM 50 MG PO TABS
100.0000 mg | ORAL_TABLET | Freq: Every day | ORAL | Status: DC
  Administered 2022-01-09: 100 mg via ORAL
  Filled 2022-01-08: qty 2

## 2022-01-08 MED ORDER — OXCARBAZEPINE 300 MG PO TABS
300.0000 mg | ORAL_TABLET | Freq: Every day | ORAL | Status: DC
  Administered 2022-01-09 (×2): 300 mg via ORAL
  Filled 2022-01-08 (×3): qty 1

## 2022-01-08 MED ORDER — ENOXAPARIN SODIUM 40 MG/0.4ML IJ SOSY
40.0000 mg | PREFILLED_SYRINGE | INTRAMUSCULAR | Status: DC
  Administered 2022-01-08: 40 mg via SUBCUTANEOUS
  Filled 2022-01-08: qty 0.4

## 2022-01-08 MED ORDER — LOSARTAN POTASSIUM-HCTZ 100-12.5 MG PO TABS: 1.0000 | ORAL_TABLET | Freq: Every day | ORAL | Status: DC

## 2022-01-08 MED ORDER — RIVASTIGMINE TARTRATE 1.5 MG PO CAPS
1.5000 mg | ORAL_CAPSULE | Freq: Two times a day (BID) | ORAL | Status: DC
  Administered 2022-01-09 – 2022-01-11 (×5): 1.5 mg via ORAL
  Filled 2022-01-08 (×7): qty 1

## 2022-01-08 MED ORDER — MOMETASONE FURO-FORMOTEROL FUM 200-5 MCG/ACT IN AERO
2.0000 | INHALATION_SPRAY | Freq: Two times a day (BID) | RESPIRATORY_TRACT | Status: DC
  Administered 2022-01-09: 2 via RESPIRATORY_TRACT
  Filled 2022-01-08: qty 8.8

## 2022-01-08 MED ORDER — METHYLPREDNISOLONE SODIUM SUCC 125 MG IJ SOLR
125.0000 mg | Freq: Once | INTRAMUSCULAR | Status: AC
  Administered 2022-01-08: 125 mg via INTRAVENOUS
  Filled 2022-01-08: qty 2

## 2022-01-08 MED ORDER — HYDROCHLOROTHIAZIDE 12.5 MG PO TABS
12.5000 mg | ORAL_TABLET | Freq: Every day | ORAL | Status: DC
  Administered 2022-01-09: 12.5 mg via ORAL
  Filled 2022-01-08: qty 1

## 2022-01-08 MED ORDER — DULOXETINE HCL 30 MG PO CPEP
60.0000 mg | ORAL_CAPSULE | Freq: Every day | ORAL | Status: DC
  Administered 2022-01-09 – 2022-01-11 (×3): 60 mg via ORAL
  Filled 2022-01-08 (×3): qty 2

## 2022-01-08 MED ORDER — BISOPROLOL FUMARATE 5 MG PO TABS
5.0000 mg | ORAL_TABLET | Freq: Every day | ORAL | Status: DC
  Administered 2022-01-09 – 2022-01-11 (×3): 5 mg via ORAL
  Filled 2022-01-08 (×3): qty 1

## 2022-01-08 MED ORDER — POTASSIUM CHLORIDE CRYS ER 20 MEQ PO TBCR
40.0000 meq | EXTENDED_RELEASE_TABLET | Freq: Once | ORAL | Status: AC
  Administered 2022-01-08: 40 meq via ORAL
  Filled 2022-01-08: qty 2

## 2022-01-08 MED ORDER — IPRATROPIUM-ALBUTEROL 0.5-2.5 (3) MG/3ML IN SOLN
RESPIRATORY_TRACT | Status: AC
  Filled 2022-01-08: qty 6

## 2022-01-08 MED ORDER — PREDNISONE 20 MG PO TABS
40.0000 mg | ORAL_TABLET | Freq: Every day | ORAL | Status: DC
  Administered 2022-01-09 – 2022-01-11 (×3): 40 mg via ORAL
  Filled 2022-01-08 (×3): qty 2

## 2022-01-08 MED ORDER — SODIUM CHLORIDE 0.9 % IV SOLN
2.0000 g | INTRAVENOUS | Status: DC
  Filled 2022-01-08: qty 20

## 2022-01-08 MED ORDER — ASPIRIN 81 MG PO TBEC
81.0000 mg | DELAYED_RELEASE_TABLET | Freq: Every day | ORAL | Status: DC
  Administered 2022-01-09 – 2022-01-11 (×3): 81 mg via ORAL
  Filled 2022-01-08 (×3): qty 1

## 2022-01-08 MED ORDER — SODIUM CHLORIDE 0.9 % IV SOLN
1.0000 g | Freq: Once | INTRAVENOUS | Status: AC
  Administered 2022-01-08: 1 g via INTRAVENOUS
  Filled 2022-01-08: qty 10

## 2022-01-08 MED ORDER — INSULIN ASPART 100 UNIT/ML IJ SOLN
0.0000 [IU] | Freq: Three times a day (TID) | INTRAMUSCULAR | Status: DC
  Administered 2022-01-09: 5 [IU] via SUBCUTANEOUS
  Administered 2022-01-09: 2 [IU] via SUBCUTANEOUS
  Filled 2022-01-08 (×2): qty 1

## 2022-01-08 MED ORDER — SODIUM CHLORIDE 0.9 % IV SOLN
500.0000 mg | Freq: Once | INTRAVENOUS | Status: AC
  Administered 2022-01-08: 500 mg via INTRAVENOUS
  Filled 2022-01-08: qty 5

## 2022-01-08 MED ORDER — IPRATROPIUM-ALBUTEROL 0.5-2.5 (3) MG/3ML IN SOLN
9.0000 mL | Freq: Once | RESPIRATORY_TRACT | Status: AC
  Administered 2022-01-08: 9 mL via RESPIRATORY_TRACT
  Filled 2022-01-08: qty 3

## 2022-01-08 MED ORDER — BUPROPION HCL ER (XL) 150 MG PO TB24
150.0000 mg | ORAL_TABLET | Freq: Every day | ORAL | Status: DC
  Administered 2022-01-09 – 2022-01-11 (×3): 150 mg via ORAL
  Filled 2022-01-08 (×3): qty 1

## 2022-01-08 MED ORDER — GABAPENTIN 100 MG PO CAPS
100.0000 mg | ORAL_CAPSULE | Freq: Two times a day (BID) | ORAL | Status: DC
  Administered 2022-01-08 – 2022-01-11 (×5): 100 mg via ORAL
  Filled 2022-01-08 (×6): qty 1

## 2022-01-08 MED ORDER — SODIUM CHLORIDE 0.9 % IV SOLN
500.0000 mg | INTRAVENOUS | Status: DC
  Administered 2022-01-09: 500 mg via INTRAVENOUS
  Filled 2022-01-08 (×2): qty 5

## 2022-01-08 MED ORDER — POLYETHYLENE GLYCOL 3350 17 G PO PACK
17.0000 g | PACK | Freq: Every day | ORAL | Status: DC
  Administered 2022-01-10 – 2022-01-11 (×2): 17 g via ORAL
  Filled 2022-01-08 (×3): qty 1

## 2022-01-08 NOTE — Assessment & Plan Note (Signed)
Troponin elevated at 21.  Patient is chest pain free with no prior history of CAD.  EKG without acute ST or T wave changes.    -Repeat troponin pending

## 2022-01-08 NOTE — ED Notes (Signed)
Pt provided with pericare, dry disposable brief, linen and pad change.

## 2022-01-08 NOTE — Assessment & Plan Note (Signed)
-   Restart home bisoprolol and amlodipine

## 2022-01-08 NOTE — Assessment & Plan Note (Addendum)
-   Restart home medications including bupropion, duloxetine, and Klonopin - Per patient's sister, patient is at baseline

## 2022-01-08 NOTE — Assessment & Plan Note (Addendum)
In the setting of community-acquired pneumonia.  - DuoNebs every 6 hours - Restart home bronchodilators - Prednisone 40 mg to complete 5-day course

## 2022-01-08 NOTE — Assessment & Plan Note (Signed)
History of prediabetes with last A1c at 6.2% approximately 4 months ago.  Given patient is receiving steroids during admission, will start SSI.  -SSI, sensitive scale

## 2022-01-08 NOTE — ED Notes (Addendum)
Per MD Basaraba, O2 sat goal 88-95.

## 2022-01-08 NOTE — ED Provider Notes (Signed)
[facility] Provider Note    Event Date/Time   First MD Initiated Contact with Patient 01/08/22 1328     (approximate)   History   Chief Complaint Altered Mental Status   HPI  Samantha Carpenter is a 81 y.o. female with past medical history of hypertension, COPD, CKD, and dementia who presents to the ED for altered mental status.  History is limited due to patient's confusion, per EMS staff at the [facility] noted patient to be more confused than usual starting earlier this morning.  Patient initially states that she is not sure why she is here and denies any complaints, later stated that she has been coughing and "breathing hard" recently.  She is not aware of any fevers and denies any pain in her chest.  She denies any pain in her abdomen, nausea, vomiting, diarrhea, or dysuria.  She was placed on 2 L nasal cannula by EMS, patient denies wearing oxygen chronically.  EMS was unsure of patient's baseline mental status.     Physical Exam   Triage Vital Signs: ED Triage Vitals  Enc Vitals Group     BP 01/08/22 1240 139/86     Pulse Rate 01/08/22 1240 64     Resp 01/08/22 1240 20     Temp 01/08/22 1240 98.4 F (36.9 C)     Temp Source 01/08/22 1240 Oral     SpO2 01/08/22 1240 93 %     Weight --      Height 01/08/22 1236 5\' 4"  (1.626 m)     Head Circumference --      Peak Flow --      Pain Score --      Pain Loc --      Pain Edu? --      Excl. in GC? --     Most recent vital signs: Vitals:   01/08/22 1240  BP: 139/86  Pulse: 64  Resp: 20  Temp: 98.4 F (36.9 C)  SpO2: 93%    Constitutional: Alert and oriented to person and place, but not time or situation. Eyes: Conjunctivae are normal. Head: Atraumatic. Nose: No congestion/rhinnorhea. Mouth/Throat: Mucous membranes are moist.  Cardiovascular: Normal rate, regular rhythm. Grossly normal heart sounds.  2+ radial pulses bilaterally. Respiratory: Tachypneic with increased respiratory  effort, lungs with poor air movement and expiratory wheezing throughout. Gastrointestinal: Soft and nontender. No distention. Musculoskeletal: No lower extremity tenderness nor edema.  Neurologic:  Normal speech and language. No gross focal neurologic deficits are appreciated.    ED Results / Procedures / Treatments   Labs (all labs ordered are listed, but only abnormal results are displayed) Labs Reviewed  COMPREHENSIVE METABOLIC PANEL - Abnormal; Notable for the following components:      Result Value   Sodium 133 (*)    Potassium 3.3 (*)    Chloride 89 (*)    Glucose, Bld 141 (*)    GFR, Estimated 59 (*)    All other components within normal limits  BRAIN NATRIURETIC PEPTIDE - Abnormal; Notable for the following components:   B Natriuretic Peptide 260.8 (*)    All other components within normal limits  TROPONIN I (HIGH SENSITIVITY) - Abnormal; Notable for the following components:   Troponin I (High Sensitivity) 21 (*)    All other components within normal limits  SARS CORONAVIRUS 2 BY RT PCR  CBC  BLOOD GAS, VENOUS  PROCALCITONIN  CBG MONITORING, ED     EKG  ED ECG REPORT  Harriet Masson, the attending physician, personally viewed and interpreted this ECG.   Date: 01/08/2022  EKG Time: 12:52  Rate: 67  Rhythm: normal sinus rhythm  Axis: Normal  Intervals:none  ST&T Change: None  RADIOLOGY Chest x-ray reviewed and interpreted by me with multifocal infiltrates concerning for edema versus atypical pneumonia.  PROCEDURES:  Critical Care performed: Yes, see critical care procedure note(s)  .Critical Care  Performed by: Chesley Noon, MD Authorized by: Chesley Noon, MD   Critical care provider statement:    Critical care time (minutes):  30   Critical care time was exclusive of:  Separately billable procedures and treating other patients and teaching time   Critical care was necessary to treat or prevent imminent or life-threatening deterioration of  the following conditions:  Respiratory failure   Critical care was time spent personally by me on the following activities:  Development of treatment plan with patient or surrogate, discussions with consultants, evaluation of patient's response to treatment, examination of patient, ordering and review of laboratory studies, ordering and review of radiographic studies, ordering and performing treatments and interventions, pulse oximetry, re-evaluation of patient's condition and review of old charts   I assumed direction of critical care for this patient from another provider in my specialty: no     Care discussed with: admitting provider      MEDICATIONS ORDERED IN ED: Medications  methylPREDNISolone sodium succinate (SOLU-MEDROL) 125 mg/2 mL injection 125 mg (has no administration in time range)  ipratropium-albuterol (DUONEB) 0.5-2.5 (3) MG/3ML nebulizer solution 9 mL (has no administration in time range)  cefTRIAXone (ROCEPHIN) 1 g in sodium chloride 0.9 % 100 mL IVPB (has no administration in time range)  azithromycin (ZITHROMAX) 500 mg in sodium chloride 0.9 % 250 mL IVPB (has no administration in time range)  potassium chloride SA (KLOR-CON M) CR tablet 40 mEq (has no administration in time range)     IMPRESSION / MDM / ASSESSMENT AND PLAN / ED COURSE  I reviewed the triage vital signs and the nursing notes.                              81 y.o. female with past medical history of hypertension, CKD, COPD, and dementia who presents to the ED for increased confusion noted at her nursing facility, now complains of cough and difficulty breathing here in the ED.  Patient's presentation is most consistent with acute presentation with potential threat to life or bodily function.  Differential diagnosis includes, but is not limited to, intracranial process, AKI, electrolyte abnormality, UTI, pneumonia, COPD exacerbation, CHF, COVID-19.  Patient chronically ill-appearing but in no acute  distress, patient was placed on 2 L nasal cannula prior to arrival by EMS following reported hypoxia and does not wear oxygen at baseline.  On my assessment, she reports cough and difficulty breathing, currently maintaining oxygen saturations on 2 L nasal cannula, but is tachypneic with increased respiratory effort.  She has poor air movement and expiratory wheezing throughout, we will treat with Solu-Medrol and DuoNebs.  Chest x-ray shows pulmonary edema versus atypical pneumonia, she does not appear clinically fluid overloaded and BNP is only very mildly elevated.  I would favor pneumonia and we will add on procalcitonin, treat with IV Rocephin and azithromycin for now.  EKG shows no evidence of arrhythmia or ischemia and troponin mildly elevated, but low suspicion for ACS or PE at this time.  Remainder of labs are reassuring  with no significant electrolyte abnormality or AKI, no significant anemia or leukocytosis noted.  Case discussed with hospitalist for admission.      FINAL CLINICAL IMPRESSION(S) / ED DIAGNOSES   Final diagnoses:  COPD exacerbation (Circle)  Pneumonia of both lungs due to infectious organism, unspecified part of lung     Rx / DC Orders   ED Discharge Orders     None        Note:  This document was prepared using Dragon voice recognition software and may include unintentional dictation errors.   Blake Divine, MD 01/08/22 1525

## 2022-01-08 NOTE — Assessment & Plan Note (Signed)
Patient presenting with 1 week of increased shortness of breath and cough found to have bilateral interstitial opacities on chest x-ray.  Given history and examination with history of COPD, most likely diagnosis is community acquired pneumonia.  Chest x-ray did note possible edema as an alternative and BNP is slightly elevated compared to prior, however patient is euvolemic on examination.  Procalcitonin is negative, so will obtain RVP panel.  Could likely de-escalate antimicrobials pending results and improvement clinically.  - Continuous pulse oximetry - Continue Rocephin and azithromycin - Strep pneumo and Legionella urinary antigens pending - RVP pending

## 2022-01-08 NOTE — H&P (Addendum)
History and Physical    Patient: Samantha Carpenter XNA:[id number] DOB: [birth year] DOA: 01/08/2022 DOS: the patient was seen and examined on 01/08/2022 PCP: Idelle Crouch, MD  Patient coming from: ALF/ILF  Chief Complaint:  Chief Complaint  Patient presents with   Altered Mental Status   HPI: Samantha Carpenter is a 81 y.o. female with medical history significant of dementia, COPD, HTN, CKD stage IIIa, prediabetes, hyperlipidemia, who presents to the ED with complaints of altered mental status.  History limited by patient's dementia.  Collateral history obtained through chart review and discussion with resident coordinator at patient's ALF.  Per chart review, patient was noted to have increased altered mental status compared to prior.  Resident coordinator states she is uncertain about this as she was not primarily taking care of Samantha Carpenter today and nurse who usually does is not available.  Nurse coordinator notes that she believes patient was sent to the ED due to abdominal pain.  She knows that Samantha Carpenter has been experiencing increased shortness of breath for the last ~ 1 week.  They had wanted to send her to the ED at that time, however patient refused.  Per ED provider note, patient endorsed shortness of breath and increased cough.  She was not able to confirm this for me.  Samantha Carpenter denies any pain however, including chest pain and abdominal pain.  ED Course:  On arrival to the ED, patient was normotensive at 139/86 with heart rate of 65.  She was afebrile at 98.4.  She was saturating at 93% on room air but was placed on 2 L of nasal cannula due to dyspnea.  Initial blood work demonstrates elevated PCO2 on VBG at 62, in the setting of upper limit of normal bicarb at 32.  BMP also remarkable for potassium of 3.3, glucose 141.  BN P elevated at 260 and troponin elevated at 21.  Procalcitonin less than 0.10.  CBC unremarkable.  Chest x-ray remarkable for increased bilateral interstitial opacities  concerning for edema versus atypical infection.  TRH consulted for admission for community-acquired pneumonia.  Review of Systems: As mentioned in the history of present illness. All other systems reviewed and are negative.  Past Medical History:  Diagnosis Date   Chronic kidney disease    COPD (chronic obstructive pulmonary disease) ([place])    Dementia ([place])    Depression    Hypertension    Liver disease    Past Surgical History:  Procedure Laterality Date   ABDOMINAL HYSTERECTOMY     CHOLECYSTECTOMY     ESOPHAGOGASTRODUODENOSCOPY N/A 11/20/2019   Procedure: ESOPHAGOGASTRODUODENOSCOPY (EGD);  Surgeon: Lesly Rubenstein, MD;  Location: [facility] ENDOSCOPY;  Service: Gastroenterology;  Laterality: N/A;   face tuck     Social History:  reports that she quit smoking about 12 years ago. She has never used smokeless tobacco. She reports that she does not currently use alcohol. She reports that she does not currently use drugs.  Allergies  Allergen Reactions   Morphine And Related    Latex Rash    "I break out"    Family History  Problem Relation Age of Onset   Bipolar disorder Brother    Schizophrenia Brother    Bipolar disorder Sister    Schizophrenia Sister    Drug abuse Son     Prior to Admission medications   Medication Sig Start Date End Date Taking? Authorizing Provider  Acetaminophen Extra Strength 500 MG TABS Take 1 tablet by mouth every 4 (four) hours as  needed (pain). 10/31/21  Yes [provider]  ADVAIR DISKUS 250-50 MCG/ACT AEPB Inhale 1 puff into the lungs 2 (two) times daily. 10/23/21  Yes [provider]  amLODipine (NORVASC) 10 MG tablet Take 1 tablet (10 mg total) by mouth daily. 11/17/21  Yes Wieting, Richard, MD  aspirin EC 81 MG tablet Take 81 mg by mouth daily.   Yes [provider]  bisoprolol (ZEBETA) 5 MG tablet Take 1 tablet (5 mg total) by mouth daily. 11/17/21  Yes Wieting, Richard, MD  buPROPion (WELLBUTRIN XL) 150 MG 24 hr tablet  Take 150 mg by mouth daily. 10/27/19  Yes [provider]  clonazePAM (KLONOPIN) 1 MG tablet Take 1 tablet (1 mg total) by mouth 2 (two) times daily. 11/16/21  Yes Wieting, Richard, MD  DULoxetine (CYMBALTA) 60 MG capsule Take 60 mg by mouth daily. 08/25/18  Yes [provider]  gabapentin (NEURONTIN) 100 MG capsule Take 100 mg by mouth 2 (two) times daily. 06/18/18  Yes [provider]  ipratropium-albuterol (DUONEB) 0.5-2.5 (3) MG/3ML SOLN Take 3 mLs by nebulization in the morning, at noon, and at bedtime. 12/28/21  Yes [provider]  losartan-hydrochlorothiazide (HYZAAR) 100-12.5 MG tablet Take 1 tablet by mouth daily. 02/20/21  Yes [provider]  OXcarbazepine (TRILEPTAL) 150 MG tablet Take 150-300 mg by mouth See admin instructions. Take 150 mg (1 tablet) by mouth every morning, and 300 mg (2 tablets) at bedtime. 06/12/18  Yes [provider]  polyethylene glycol (MIRALAX / GLYCOLAX) 17 g packet Take 17 g by mouth daily. 10/25/21  Yes [provider]  rivastigmine (EXELON) 1.5 MG capsule Take 1.5 mg by mouth 2 (two) times daily. 03/28/20 01/08/22 Yes [provider]  albuterol (VENTOLIN HFA) 108 (90 Base) MCG/ACT inhaler Inhale 2 puffs into the lungs every 6 (six) hours as needed for wheezing or shortness of breath. 05/05/20   [provider]  haloperidol (HALDOL) 2 MG tablet Take 1 tablet (2 mg total) by mouth every 6 (six) hours as needed for agitation. 11/16/21   Loletha Grayer, MD   Physical Exam: Vitals:   01/08/22 1240 01/08/22 1540 01/08/22 1600 01/08/22 1635  BP: 139/86 136/84  (!) 128/41  Pulse: 64 60  68  Resp: 20 (!) 22  (!) 22  Temp: 98.4 F (36.9 C)  98.2 F (36.8 C)   TempSrc: Oral     SpO2: 93% 100%  100%  Height:       Physical Exam Vitals and nursing note reviewed.  Constitutional:      General: She is not in acute distress.    Appearance: She is normal weight. She is not toxic-appearing.   HENT:     Head: Normocephalic and atraumatic.     Mouth/Throat:     Mouth: Mucous membranes are moist.     Pharynx: Oropharynx is clear.  Eyes:     Extraocular Movements: Extraocular movements intact.     Conjunctiva/sclera: Conjunctivae normal.  Cardiovascular:     Rate and Rhythm: Normal rate and regular rhythm.     Heart sounds: No murmur heard.    No gallop.  Pulmonary:     Effort: Pulmonary effort is normal. Tachypnea present. No accessory muscle usage.     Breath sounds: No stridor. Wheezing (Bilateral diffuse expiratory wheezing heard in all lung fields) and rhonchi (Bilateral rhonchi involving the middle and lower lung fields) present. No decreased breath sounds or rales.  Musculoskeletal:     Right lower leg:  Edema (Minimal trace pitting edema) present.     Left lower leg: Edema (Minimal trace pitting edema) present.  Skin:    General: Skin is warm and dry.  Neurological:     Mental Status: She is alert. She is disoriented.     Sensory: Sensation is intact.     Motor: No weakness.     Comments: Patient oriented to person and time only.  She is not oriented to situation or location.  Psychiatric:        Mood and Affect: Mood normal.        Behavior: Behavior normal.    Data Reviewed: CBC with no evidence of leukocytosis, anemia or thrombocytopenia.  CMP remarkable for sodium of 133, potassium of 3.3, chloride of 89, glucose of 141, creatinine of 0.96 with GFR 59.  BNP elevated at 260.  Troponin elevated at 21.  Procalcitonin less than 0.10.  VBG with PCO2 of 62, bicarb of 40.  COVID-19 PCR negative.  Chest x-ray personally reviewed.  Increased lower lung field interstitial opacity, right more than left.  No pleural effusions.  Flattened diaphragm consistent with history of emphysema.  CT head with chronic microvascular disease and atrophy; no acute intracranial abnormality.  EKG personally reviewed.  Significant motion artifact noted.  We will need to reobtain.   Otherwise sinus rhythm with a rate of 67.  Results are pending, will review when available.  Assessment and Plan: * CAP (community acquired pneumonia) Patient presenting with 1 week of increased shortness of breath and cough found to have bilateral interstitial opacities on chest x-ray.  Given history and examination with history of COPD, most likely diagnosis is community acquired pneumonia.  Chest x-ray did note possible edema as an alternative and BNP is slightly elevated compared to prior, however patient is euvolemic on examination.  Procalcitonin is negative, so will obtain RVP panel.  Could likely de-escalate antimicrobials pending results and improvement clinically.  - Continuous pulse oximetry - Continue Rocephin and azithromycin - Strep pneumo and Legionella urinary antigens pending - RVP pending  COPD exacerbation (HCC) In the setting of community-acquired pneumonia.  - DuoNebs every 6 hours - Restart home bronchodilators - Prednisone 40 mg to complete 5-day course  Elevated troponin Troponin elevated at 21.  Patient is chest pain free with no prior history of CAD.  EKG without acute ST or T wave changes.    -Repeat troponin pending  Dementia with behavioral disturbance (HCC) - Restart home medications including bupropion, duloxetine, and Klonopin - Per patient's sister, patient is at baseline   HTN, goal below 140/80 - Restart home bisoprolol and amlodipine  Prediabetes History of prediabetes with last A1c at 6.2% approximately 4 months ago.  Given patient is receiving steroids during admission, will start SSI.  -SSI, sensitive scale   Advance Care Planning:   Code Status: DNR.  Contacted patient's sister Ms. Aaron Mose and discussed CODE STATUS.  Ms. Danae Orleans stated she understands what CPR and intubation, patient's frequently have ribs broken and sometimes are unable to come off the ventilator.  Due to this, she would not want to prolong her sister suffering.  She would  like patient to be DNR/DNI at this time and she will continue discussions with her family and will let the medical team know if there is any change of mind.  Consults: None  Family Communication: Attempted to contact patient's sister listed on chart.  Unable to leave voicemail due to mailbox being full.  Severity of Illness: The appropriate  patient status for this patient is OBSERVATION. Observation status is judged to be reasonable and necessary in order to provide the required intensity of service to ensure the patient's safety. The patient's presenting symptoms, physical exam findings, and initial radiographic and laboratory data in the context of their medical condition is felt to place them at decreased risk for further clinical deterioration. Furthermore, it is anticipated that the patient will be medically stable for discharge from the hospital within 2 midnights of admission.   Author: Jose Persia, MD 01/08/2022 7:07 PM  For on call review [email or web address].

## 2022-01-08 NOTE — ED Triage Notes (Signed)
Pt via EMS from the [facility]. Pt here for altered mental status since this AM. Pt has a hx of dementia unknown pt's baseline. Pt is alert and oriented to only self. States that her butt hurts from sitting in the wheelchair.

## 2022-01-09 DIAGNOSIS — Z885 Allergy status to narcotic agent status: Secondary | ICD-10-CM | POA: Diagnosis not present

## 2022-01-09 DIAGNOSIS — Z91040 Latex allergy status: Secondary | ICD-10-CM | POA: Diagnosis not present

## 2022-01-09 DIAGNOSIS — J9601 Acute respiratory failure with hypoxia: Secondary | ICD-10-CM | POA: Diagnosis present

## 2022-01-09 DIAGNOSIS — E785 Hyperlipidemia, unspecified: Secondary | ICD-10-CM | POA: Diagnosis present

## 2022-01-09 DIAGNOSIS — I2489 Other forms of acute ischemic heart disease: Secondary | ICD-10-CM | POA: Diagnosis present

## 2022-01-09 DIAGNOSIS — N179 Acute kidney failure, unspecified: Secondary | ICD-10-CM | POA: Diagnosis not present

## 2022-01-09 DIAGNOSIS — L8990 Pressure ulcer of unspecified site, unspecified stage: Secondary | ICD-10-CM | POA: Insufficient documentation

## 2022-01-09 DIAGNOSIS — E876 Hypokalemia: Secondary | ICD-10-CM | POA: Diagnosis not present

## 2022-01-09 DIAGNOSIS — F32A Depression, unspecified: Secondary | ICD-10-CM | POA: Diagnosis present

## 2022-01-09 DIAGNOSIS — Z1152 Encounter for screening for COVID-19: Secondary | ICD-10-CM | POA: Diagnosis not present

## 2022-01-09 DIAGNOSIS — Z818 Family history of other mental and behavioral disorders: Secondary | ICD-10-CM | POA: Diagnosis not present

## 2022-01-09 DIAGNOSIS — J441 Chronic obstructive pulmonary disease with (acute) exacerbation: Secondary | ICD-10-CM | POA: Diagnosis present

## 2022-01-09 DIAGNOSIS — R7303 Prediabetes: Secondary | ICD-10-CM | POA: Diagnosis present

## 2022-01-09 DIAGNOSIS — E162 Hypoglycemia, unspecified: Secondary | ICD-10-CM | POA: Diagnosis not present

## 2022-01-09 DIAGNOSIS — L89322 Pressure ulcer of left buttock, stage 2: Secondary | ICD-10-CM | POA: Diagnosis present

## 2022-01-09 DIAGNOSIS — Z7951 Long term (current) use of inhaled steroids: Secondary | ICD-10-CM | POA: Diagnosis not present

## 2022-01-09 DIAGNOSIS — N1831 Chronic kidney disease, stage 3a: Secondary | ICD-10-CM | POA: Diagnosis present

## 2022-01-09 DIAGNOSIS — I5032 Chronic diastolic (congestive) heart failure: Secondary | ICD-10-CM | POA: Diagnosis present

## 2022-01-09 DIAGNOSIS — Z66 Do not resuscitate: Secondary | ICD-10-CM | POA: Diagnosis present

## 2022-01-09 DIAGNOSIS — F03918 Unspecified dementia, unspecified severity, with other behavioral disturbance: Secondary | ICD-10-CM | POA: Diagnosis present

## 2022-01-09 DIAGNOSIS — Z87891 Personal history of nicotine dependence: Secondary | ICD-10-CM | POA: Diagnosis not present

## 2022-01-09 DIAGNOSIS — I130 Hypertensive heart and chronic kidney disease with heart failure and stage 1 through stage 4 chronic kidney disease, or unspecified chronic kidney disease: Secondary | ICD-10-CM | POA: Diagnosis present

## 2022-01-09 DIAGNOSIS — K769 Liver disease, unspecified: Secondary | ICD-10-CM | POA: Diagnosis present

## 2022-01-09 DIAGNOSIS — Z7982 Long term (current) use of aspirin: Secondary | ICD-10-CM | POA: Diagnosis not present

## 2022-01-09 DIAGNOSIS — L89312 Pressure ulcer of right buttock, stage 2: Secondary | ICD-10-CM | POA: Diagnosis present

## 2022-01-09 LAB — CBC WITH DIFFERENTIAL/PLATELET
Abs Immature Granulocytes: 0.03 10*3/uL (ref 0.00–0.07)
Basophils Absolute: 0 10*3/uL (ref 0.0–0.1)
Basophils Relative: 0 %
Eosinophils Absolute: 0 10*3/uL (ref 0.0–0.5)
Eosinophils Relative: 0 %
HCT: 33 % — ABNORMAL LOW (ref 36.0–46.0)
Hemoglobin: 10.9 g/dL — ABNORMAL LOW (ref 12.0–15.0)
Immature Granulocytes: 0 %
Lymphocytes Relative: 8 %
Lymphs Abs: 0.6 10*3/uL — ABNORMAL LOW (ref 0.7–4.0)
MCH: 30.5 pg (ref 26.0–34.0)
MCHC: 33 g/dL (ref 30.0–36.0)
MCV: 92.4 fL (ref 80.0–100.0)
Monocytes Absolute: 0.1 10*3/uL (ref 0.1–1.0)
Monocytes Relative: 2 %
Neutro Abs: 6.6 10*3/uL (ref 1.7–7.7)
Neutrophils Relative %: 90 %
Platelets: 225 10*3/uL (ref 150–400)
RBC: 3.57 MIL/uL — ABNORMAL LOW (ref 3.87–5.11)
RDW: 12.8 % (ref 11.5–15.5)
WBC: 7.4 10*3/uL (ref 4.0–10.5)
nRBC: 0 % (ref 0.0–0.2)

## 2022-01-09 LAB — BASIC METABOLIC PANEL
Anion gap: 13 (ref 5–15)
BUN: 29 mg/dL — ABNORMAL HIGH (ref 8–23)
CO2: 28 mmol/L (ref 22–32)
Calcium: 8.9 mg/dL (ref 8.9–10.3)
Chloride: 94 mmol/L — ABNORMAL LOW (ref 98–111)
Creatinine, Ser: 1.71 mg/dL — ABNORMAL HIGH (ref 0.44–1.00)
GFR, Estimated: 30 mL/min — ABNORMAL LOW (ref >60)
Glucose, Bld: 201 mg/dL — ABNORMAL HIGH (ref 70–99)
Potassium: 3.9 mmol/L (ref 3.5–5.1)
Sodium: 135 mmol/L (ref 135–145)

## 2022-01-09 LAB — GLUCOSE, CAPILLARY
Glucose-Capillary: 178 mg/dL — ABNORMAL HIGH (ref 70–99)
Glucose-Capillary: 296 mg/dL — ABNORMAL HIGH (ref 70–99)
Glucose-Capillary: 175 mg/dL — ABNORMAL HIGH (ref 70–99)

## 2022-01-09 LAB — HEMOGLOBIN A1C
Hgb A1c MFr Bld: 6.1 % — ABNORMAL HIGH (ref 4.8–5.6)
Mean Plasma Glucose: 128.37 mg/dL

## 2022-01-09 LAB — HIV ANTIBODY (ROUTINE TESTING W REFLEX): HIV Screen 4th Generation wRfx: NONREACTIVE

## 2022-01-09 MED ORDER — IPRATROPIUM-ALBUTEROL 0.5-2.5 (3) MG/3ML IN SOLN
3.0000 mL | RESPIRATORY_TRACT | Status: DC
  Administered 2022-01-09 (×2): 3 mL via RESPIRATORY_TRACT
  Filled 2022-01-09 (×2): qty 3

## 2022-01-09 MED ORDER — IPRATROPIUM-ALBUTEROL 0.5-2.5 (3) MG/3ML IN SOLN: 3.0000 mL | Freq: Three times a day (TID) | RESPIRATORY_TRACT | Status: DC

## 2022-01-09 MED ORDER — ENOXAPARIN SODIUM 30 MG/0.3ML IJ SOSY
30.0000 mg | PREFILLED_SYRINGE | INTRAMUSCULAR | Status: DC
  Administered 2022-01-09 – 2022-01-10 (×2): 30 mg via SUBCUTANEOUS
  Filled 2022-01-09 (×2): qty 0.3

## 2022-01-09 MED ORDER — INSULIN ASPART 100 UNIT/ML IJ SOLN
0.0000 [IU] | Freq: Three times a day (TID) | INTRAMUSCULAR | Status: DC
  Administered 2022-01-10: 8 [IU] via SUBCUTANEOUS
  Filled 2022-01-09: qty 1

## 2022-01-09 MED ORDER — IPRATROPIUM-ALBUTEROL 0.5-2.5 (3) MG/3ML IN SOLN
3.0000 mL | Freq: Four times a day (QID) | RESPIRATORY_TRACT | Status: DC
  Administered 2022-01-10 – 2022-01-11 (×5): 3 mL via RESPIRATORY_TRACT
  Filled 2022-01-09 (×7): qty 3

## 2022-01-09 MED ORDER — FUROSEMIDE 10 MG/ML IJ SOLN
40.0000 mg | Freq: Once | INTRAMUSCULAR | Status: AC
  Administered 2022-01-09: 40 mg via INTRAVENOUS
  Filled 2022-01-09: qty 4

## 2022-01-09 MED ORDER — BUDESONIDE 0.25 MG/2ML IN SUSP
0.2500 mg | Freq: Two times a day (BID) | RESPIRATORY_TRACT | Status: DC
  Administered 2022-01-09 – 2022-01-11 (×3): 0.25 mg via RESPIRATORY_TRACT
  Filled 2022-01-09 (×4): qty 2

## 2022-01-09 MED ORDER — ARFORMOTEROL TARTRATE 15 MCG/2ML IN NEBU
15.0000 ug | INHALATION_SOLUTION | Freq: Two times a day (BID) | RESPIRATORY_TRACT | Status: DC
  Administered 2022-01-09 – 2022-01-11 (×3): 15 ug via RESPIRATORY_TRACT
  Filled 2022-01-09 (×5): qty 2

## 2022-01-09 NOTE — TOC Initial Note (Signed)
Transition of Care [facility]) - Initial/Assessment Note    Patient Details  Name: Samantha Carpenter MRN: [id number] Date of Birth: Jan 09, 1941  Transition of Care [facility]) CM/SW Contact:    Beverly Sessions, RN Phone Number: 01/09/2022, 2:19 PM  Clinical Narrative:                  Admitted for: CAP Admitted from: [facility] PCP: Doy Hutching  Current home health/prior home health/DME: Home health through [facility] RN PT AND OT.  Meg with [facility] aware of admission  Confirmed with Dustin at the [facility] that patient can return at discharge.  Depending on when patient is discharged they may be able to provide transportation  The [facility] will need FL2 and DC summary at discharge Vm left for sister to confirm discharge plan        Patient Goals and CMS Choice        Expected Discharge Plan and Services                                                Prior Living Arrangements/Services                       Activities of Daily Living      Permission Sought/Granted                  Emotional Assessment              Admission diagnosis:  COPD exacerbation ([place]) [J44.1] CAP (community acquired pneumonia) [J18.9] Pneumonia of both lungs due to infectious organism, unspecified part of lung [J18.9] Patient Active Problem List   Diagnosis Date Noted   Pressure injury of skin 01/09/2022   CAP (community acquired pneumonia) 01/08/2022   COPD exacerbation ([place]) 01/08/2022   Prediabetes 01/08/2022   Dementia with behavioral disturbance ([place]) 11/15/2021   Hypoxia 11/15/2021   COVID-19 virus infection 11/14/2021   Right rib fracture 11/09/2020   Fall at home, initial encounter 11/09/2020   COPD (chronic obstructive pulmonary disease) ([place]) 11/09/2020   Depression with anxiety 11/09/2020   Elevated troponin 11/09/2020   Acute respiratory failure with hypoxia ([place]) 11/09/2020   HTN (hypertension) 11/09/2020   Abrasions of multiple sites    Pure  hypercholesterolemia 06/09/2018   Recurrent major depressive disorder, 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 [place] [address] Phone: [phone] Fax: [phone]     Social Determinants of Health (SDOH) Interventions    Readmission Risk Interventions     No data to display

## 2022-01-09 NOTE — Progress Notes (Signed)
PROGRESS NOTE    Samantha Carpenter  XTK:[id number] DOB: [birth year] DOA: 01/08/2022 PCP: Idelle Crouch, MD    Brief Narrative:  81 y.o. female with medical history significant of dementia, COPD, HTN, CKD stage IIIa, prediabetes, hyperlipidemia, who presents to the ED with complaints of altered mental status.  History limited by patient's dementia.  Collateral history obtained through chart review and discussion with resident coordinator at patient's ALF.   Per chart review, patient was noted to have increased altered mental status compared to prior.  Resident coordinator states she is uncertain about this as she was not primarily taking care of Samantha Carpenter today and nurse who usually does is not available.  Nurse coordinator notes that she believes patient was sent to the ED due to abdominal pain.  She knows that Samantha Carpenter has been experiencing increased shortness of breath for the last ~ 1 week.  They had wanted to send her to the ED at that time, however patient refused.  Per ED provider note, patient endorsed shortness of breath and increased cough.  She was not able to confirm this for me.  [name] denies any pain however, including chest pain and abdominal pain.  Initially required 3 L nasal cannula.  This has been weaned to 1 L as of 10/24.  Procalcitonin respiratory viral panel negative.  Mild edema noted on CXR but suspect presentation is all secondary to decompensated COPD.   Assessment & Plan:   Principal Problem:   CAP (community acquired pneumonia) Active Problems:   COPD exacerbation (HCC)   Elevated troponin   Dementia with behavioral disturbance (HCC)   HTN, goal below 140/80   Prediabetes   Pressure injury of skin  Acute exacerbation of COPD Acute hypoxic respiratory failure Patient presented initially with shortness of breath and cough.  Interstitial opacities on CXR.  Could be secondary to fluid overload but BNP only slightly elevated.  Patient otherwise euvolemic on  examination.  Procalcitonin negative.  RVP negative. -Suspect presentation secondary to decompensated COPD -CAP ruled out Plan: Continue p.o. prednisone 40 mg daily DuoNebs every 4 hours Brovana twice daily Pulmicort twice daily Hold home Dulera Wean oxygen as tolerated Azithromycin for anti-inflammatory effect Discontinue Rocephin  Elevated troponin Minimal elevation.  Suspect supply demand ischemia.  No indication of ACS.  Dementia with behavioral disturbance Per patient's sister, patient is at baseline level of function PTA bupropion, duloxetine, Klonopin  Essential hypertension PTA bisoprolol and amlodipine  Prediabetes Hemoglobin A1c 6.1 SSI while on steroids  DVT prophylaxis: SQ Lovenox Code Status: DNR Family Communication: None today.  Attempted to call Sister Baxter Hire [phone].  No answer.  Unable to leave voicemail Disposition Plan: Status is: Inpatient Remains inpatient appropriate because: Acute exacerbation of COPD on supplemental oxygen.   Level of care: Med-Surg  Consultants:  None  Procedures:  None  Antimicrobials: Azithromycin   Subjective: Seen and examined.  Sleeping on arrival.  Easily awakened.  Answers questions appropriately.  Complains of cough and shortness of breath associated with wheeze.  Objective: Vitals:   01/08/22 2224 01/09/22 0233 01/09/22 0432 01/09/22 0759  BP: (!) 106/47  (!) 108/51 (!) 98/57  Pulse: 76  65 66  Resp: (!) 22  (!) 21 18  Temp: 98.9 F (37.2 C)  97.6 F (36.4 C) 97.9 F (36.6 C)  TempSrc:    Oral  SpO2: 99% 93% 95% 99%  Height:        Intake/Output Summary (Last 24 hours) at 01/09/2022 1327 Last data  filed at 01/09/2022 0000 Gross per 24 hour  Intake 100 ml  Output --  Net 100 ml   There were no vitals filed for this visit.  Examination:  General exam: Appears calm and comfortable  Respiratory system: Diffuse wheeze.  Bibasilar crackles.  Normal work of breathing.  1 L Cardiovascular  system: S1-S2, RRR, no murmurs, no pedal edema Gastrointestinal system: Abdomen is nondistended, soft and nontender. No organomegaly or masses felt. Normal bowel sounds heard. Central nervous system: Alert, oriented x2, no focal deficits Extremities: Symmetric 5 x 5 power. Skin: No rashes, lesions or ulcers Psychiatry: Judgement and insight appear normal. Mood & affect appropriate.     Data Reviewed: I have personally reviewed following labs and imaging studies  CBC: Recent Labs  Lab 01/08/22 1238 01/09/22 0401  WBC 9.9 7.4  NEUTROABS  --  6.6  HGB 12.4 10.9*  HCT 37.9 33.0*  MCV 93.6 92.4  PLT 234 225   Basic Metabolic Panel: Recent Labs  Lab 01/08/22 1238 01/09/22 0401  NA 133* 135  K 3.3* 3.9  CL 89* 94*  CO2 32 28  GLUCOSE 141* 201*  BUN 18 29*  CREATININE 0.96 1.71*  CALCIUM 9.3 8.9   GFR: CrCl cannot be calculated (Unknown ideal weight.). Liver Function Tests: Recent Labs  Lab 01/08/22 1238  AST 21  ALT 13  ALKPHOS 77  BILITOT 0.8  PROT 7.5  ALBUMIN 4.0   No results for input(s): "LIPASE", "AMYLASE" in the last 168 hours. No results for input(s): "AMMONIA" in the last 168 hours. Coagulation Profile: No results for input(s): "INR", "PROTIME" in the last 168 hours. Cardiac Enzymes: No results for input(s): "CKTOTAL", "CKMB", "CKMBINDEX", "TROPONINI" in the last 168 hours. BNP (last 3 results) No results for input(s): "PROBNP" in the last 8760 hours. HbA1C: Recent Labs    01/08/22 2218  HGBA1C 6.1*   CBG: Recent Labs  Lab 01/08/22 2225 01/09/22 0802 01/09/22 1207  GLUCAP 199* 178* 296*   Lipid Profile: No results for input(s): "CHOL", "HDL", "LDLCALC", "TRIG", "CHOLHDL", "LDLDIRECT" in the last 72 hours. Thyroid Function Tests: No results for input(s): "TSH", "T4TOTAL", "FREET4", "T3FREE", "THYROIDAB" in the last 72 hours. Anemia Panel: No results for input(s): "VITAMINB12", "FOLATE", "FERRITIN", "TIBC", "IRON", "RETICCTPCT" in the last  72 hours. Sepsis Labs: Recent Labs  Lab 01/08/22 1241  PROCALCITON <0.10    Recent Results (from the past 240 hour(s))  SARS Coronavirus 2 by RT PCR (hospital order, performed in [facility] hospital lab) *cepheid single result test* Anterior Nasal Swab     Status: None   Collection Time: 01/08/22  4:37 PM   Specimen: Anterior Nasal Swab  Result Value Ref Range Status   SARS Coronavirus 2 by RT PCR NEGATIVE NEGATIVE Final    Comment: (NOTE) SARS-CoV-2 target nucleic acids are NOT DETECTED.  The SARS-CoV-2 RNA is generally detectable in upper and lower respiratory specimens during the acute phase of infection. The lowest concentration of SARS-CoV-2 viral copies this assay can detect is 250 copies / mL. A negative result does not preclude SARS-CoV-2 infection and should not be used as the sole basis for treatment or other patient management decisions.  A negative result may occur with improper specimen collection / handling, submission of specimen other than nasopharyngeal swab, presence of viral mutation(s) within the areas targeted by this assay, and inadequate number of viral copies (<250 copies / mL). A negative result must be combined with clinical observations, patient history, and epidemiological information.  Fact  Sheet for Patients:   https://www.patel.info/  Fact Sheet for Healthcare Providers: https://hall.com/  This test is not yet approved or  cleared by the [place] FDA and has been authorized for detection and/or diagnosis of SARS-CoV-2 by FDA under an Emergency Use Authorization (EUA).  This EUA will remain in effect (meaning this test can be used) for the duration of the COVID-19 declaration under Section 564(b)(1) of the Act, 21 U.S.C. section 360bbb-3(b)(1), unless the authorization is terminated or revoked sooner.  Performed at [facility], [place], [place] [address]   Respiratory  (~20 pathogens) panel by PCR     Status: None   Collection Time: 01/08/22  5:42 PM   Specimen: Nasopharyngeal Swab; Respiratory  Result Value Ref Range Status   Adenovirus NOT DETECTED NOT DETECTED Final   Coronavirus 229E NOT DETECTED NOT DETECTED Final    Comment: (NOTE) The Coronavirus on the Respiratory Panel, DOES NOT test for the novel  Coronavirus (2019 nCoV)    Coronavirus HKU1 NOT DETECTED NOT DETECTED Final   Coronavirus NL63 NOT DETECTED NOT DETECTED Final   Coronavirus OC43 NOT DETECTED NOT DETECTED Final   Metapneumovirus NOT DETECTED NOT DETECTED Final   Rhinovirus / Enterovirus NOT DETECTED NOT DETECTED Final   Influenza A NOT DETECTED NOT DETECTED Final   Influenza B NOT DETECTED NOT DETECTED Final   Parainfluenza Virus 1 NOT DETECTED NOT DETECTED Final   Parainfluenza Virus 2 NOT DETECTED NOT DETECTED Final   Parainfluenza Virus 3 NOT DETECTED NOT DETECTED Final   Parainfluenza Virus 4 NOT DETECTED NOT DETECTED Final   Respiratory Syncytial Virus NOT DETECTED NOT DETECTED Final   Bordetella pertussis NOT DETECTED NOT DETECTED Final   Bordetella Parapertussis NOT DETECTED NOT DETECTED Final   Chlamydophila pneumoniae NOT DETECTED NOT DETECTED Final   Mycoplasma pneumoniae NOT DETECTED NOT DETECTED Final    Comment: Performed at [facility], [place]. [address], [place], [place] [address]         Radiology Studies: DG Chest Portable 1 View  Result Date: 01/08/2022 CLINICAL DATA:  Shortness of breath EXAM: PORTABLE CHEST 1 VIEW COMPARISON:  Chest radiograph 11/13/2021 FINDINGS: Likely small bilateral pleural effusions. No pneumothorax. Unchanged cardiac and mediastinal contours. No focal airspace opacity. Compared to prior exam there is increased prominence of the interstitium, which can be seen in the setting of mild pulmonary edema. No displaced rib fractures. IMPRESSION: Increased bilateral interstitial opacities could be seen in the setting of pulmonary edema  or atypical infection. Possible small bilateral pleural effusions. Electronically Signed   By: Marin Roberts M.D.   On: 01/08/2022 14:50   CT Head Wo Contrast  Result Date: 01/08/2022 CLINICAL DATA:  Mental status change, unknown cause EXAM: CT HEAD WITHOUT CONTRAST TECHNIQUE: Contiguous axial images were obtained from the base of the skull through the vertex without intravenous contrast. RADIATION DOSE REDUCTION: This exam was performed according to the departmental dose-optimization program which includes automated exposure control, adjustment of the mA and/or kV according to patient size and/or use of iterative reconstruction technique. COMPARISON:  12/26/2021 FINDINGS: Brain: There is atrophy and chronic small vessel disease changes. No acute intracranial abnormality. Specifically, no hemorrhage, hydrocephalus, mass lesion, acute infarction, or significant intracranial injury. Vascular: No hyperdense vessel or unexpected calcification. Skull: No acute calvarial abnormality. Sinuses/Orbits: Air-fluid level in the left maxillary sinus. Mucosal thickening in scattered ethmoid air cells on the left maxillary sinus. Other: None IMPRESSION: Atrophy, chronic microvascular disease. No acute intracranial abnormality.  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 Electronically Signed   By: Rolm Baptise M.D.   On: 01/08/2022 14:24        Scheduled Meds:  amLODipine  10 mg Oral Daily   arformoterol  15 mcg Nebulization BID   aspirin EC  81 mg Oral Daily   bisoprolol  5 mg Oral Daily   budesonide (PULMICORT) nebulizer solution  0.25 mg Nebulization BID   buPROPion  150 mg Oral Daily   clonazePAM  1 mg Oral BID   DULoxetine  60 mg Oral Daily   enoxaparin (LOVENOX) injection  30 mg Subcutaneous Q24H   gabapentin  100 mg Oral BID   losartan  100 mg Oral Daily   And   hydrochlorothiazide  12.5 mg Oral Daily   insulin aspart  0-9 Units Subcutaneous TID WC   ipratropium-albuterol  3 mL Nebulization Q4H   OXcarbazepine   150 mg Oral q morning   OXcarbazepine  300 mg Oral QHS   polyethylene glycol  17 g Oral Daily   predniSONE  40 mg Oral Q breakfast   rivastigmine  1.5 mg Oral BID   Continuous Infusions:  azithromycin       LOS: 0 days     Sidney Ace, MD [facility]   If 7PM-7AM, please contact night-coverage  01/09/2022, 1:27 PM

## 2022-01-09 NOTE — TOC Initial Note (Signed)
Transition of Care [facility]) - Initial/Assessment Note    Patient Details  Name: Samantha Carpenter MRN: [id number] Date of Birth: 1940/08/05  [facility]) CM/SW Contact:    Beverly Sessions, RN Phone Number: 01/09/2022, 9:42 AM  Clinical Narrative:                 Patient from [facility] [place] Email sent to [place] at the [facility] to determine patients baseline mobility         Patient Goals and CMS Choice        Expected Discharge Plan and Services                                                Prior Living Arrangements/Services                       Activities of Daily Living      Permission Sought/Granted                  Emotional Assessment              Admission diagnosis:  COPD exacerbation ([place]) [J44.1] CAP (community acquired pneumonia) [J18.9] Pneumonia of both lungs due to infectious organism, unspecified part of lung [J18.9] Patient Active Problem List   Diagnosis Date Noted   Pressure injury of skin 01/09/2022   CAP (community acquired pneumonia) 01/08/2022   COPD exacerbation ([place]) 01/08/2022   Prediabetes 01/08/2022   Dementia with behavioral disturbance ([place]) 11/15/2021   Hypoxia 11/15/2021   COVID-19 virus infection 11/14/2021   Right rib fracture 11/09/2020   Fall at home, initial encounter 11/09/2020   COPD (chronic obstructive pulmonary disease) ([place]) 11/09/2020   Depression with anxiety 11/09/2020   Elevated troponin 11/09/2020   Acute respiratory failure with hypoxia ([place]) 11/09/2020   HTN (hypertension) 11/09/2020   Abrasions of multiple sites    Pure hypercholesterolemia 06/09/2018   Recurrent major depressive disorder, in partial remission ([place]) 04/28/2018   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 [id number],  [address] [place] [address] Phone: [phone] Fax: [phone]  [id number] [place], [place] - [address] [place] [address] Phone: [phone] Fax: [phone]     Social Determinants of Health (SDOH) Interventions    Readmission Risk Interventions     No data to display

## 2022-01-10 ENCOUNTER — Encounter: Payer: Self-pay | Admitting: Internal Medicine

## 2022-01-10 DIAGNOSIS — J441 Chronic obstructive pulmonary disease with (acute) exacerbation: Secondary | ICD-10-CM | POA: Diagnosis not present

## 2022-01-10 DIAGNOSIS — N179 Acute kidney failure, unspecified: Secondary | ICD-10-CM

## 2022-01-10 LAB — GLUCOSE, CAPILLARY
Glucose-Capillary: 116 mg/dL — ABNORMAL HIGH (ref 70–99)
Glucose-Capillary: 175 mg/dL — ABNORMAL HIGH (ref 70–99)
Glucose-Capillary: 282 mg/dL — ABNORMAL HIGH (ref 70–99)
Glucose-Capillary: 159 mg/dL — ABNORMAL HIGH (ref 70–99)
Glucose-Capillary: 38 mg/dL — CL (ref 70–99)
Glucose-Capillary: 83 mg/dL (ref 70–99)

## 2022-01-10 LAB — BASIC METABOLIC PANEL
Anion gap: 11 (ref 5–15)
BUN: 44 mg/dL — ABNORMAL HIGH (ref 8–23)
CO2: 30 mmol/L (ref 22–32)
Calcium: 9 mg/dL (ref 8.9–10.3)
Chloride: 92 mmol/L — ABNORMAL LOW (ref 98–111)
Creatinine, Ser: 1.49 mg/dL — ABNORMAL HIGH (ref 0.44–1.00)
GFR, Estimated: 35 mL/min — ABNORMAL LOW (ref >60)
Glucose, Bld: 138 mg/dL — ABNORMAL HIGH (ref 70–99)
Potassium: 3.4 mmol/L — ABNORMAL LOW (ref 3.5–5.1)
Sodium: 133 mmol/L — ABNORMAL LOW (ref 135–145)

## 2022-01-10 MED ORDER — LACTATED RINGERS IV SOLN: INTRAVENOUS | Status: DC

## 2022-01-10 MED ORDER — KCL-LACTATED RINGERS-D5W 20 MEQ/L IV SOLN
INTRAVENOUS | Status: DC
  Filled 2022-01-10: qty 1000

## 2022-01-10 MED ORDER — AZITHROMYCIN 250 MG PO TABS
500.0000 mg | ORAL_TABLET | Freq: Every day | ORAL | Status: DC
  Administered 2022-01-10: 500 mg via ORAL
  Filled 2022-01-10: qty 2

## 2022-01-10 MED ORDER — DEXTROSE 50 % IV SOLN: 25.0000 g | INTRAVENOUS | Status: AC

## 2022-01-10 MED ORDER — POTASSIUM CHLORIDE 2 MEQ/ML IV SOLN
INTRAVENOUS | Status: DC
  Filled 2022-01-10: qty 1000

## 2022-01-10 MED ORDER — POTASSIUM CHLORIDE CRYS ER 20 MEQ PO TBCR
40.0000 meq | EXTENDED_RELEASE_TABLET | Freq: Once | ORAL | Status: AC
  Administered 2022-01-10: 40 meq via ORAL
  Filled 2022-01-10: qty 2

## 2022-01-10 MED ORDER — DEXTROSE 50 % IV SOLN
INTRAVENOUS | Status: AC
  Administered 2022-01-10: 25 g via INTRAVENOUS
  Filled 2022-01-10: qty 50

## 2022-01-10 MED ORDER — KCL-LACTATED RINGERS 20 MEQ/L IV SOLN: INTRAVENOUS | Status: DC

## 2022-01-10 NOTE — Progress Notes (Signed)
Hypoglycemic Event  CBG: 38 at 2032  Treatment: D50 50 mL (25 gm)  Symptoms: pale, sweaty, and minimally responsive   Follow-up CBG: Time: 2059  CBG Result: 159  Possible Reasons for Event: Inadequate meal intake  Comments/MD notified: Sharion Settler, NP notified.     Corky Crafts

## 2022-01-10 NOTE — Progress Notes (Signed)
Cross Cover Hypoglycemic event IV fluids changed to D5LR with 20 KCL at 50

## 2022-01-10 NOTE — Progress Notes (Addendum)
Progress Note    Samantha Carpenter  [id number] DOB: Oct 05, 1940  DOA: 01/08/2022 PCP: Idelle Crouch, MD      Brief Narrative:    Medical records reviewed and are as summarized below:  Samantha Carpenter is a 81 y.o. female with medical history significant for dementia, COPD, hypertension, CKD stage IIIa, prediabetes, hyperlipidemia, who presented to the hospital with altered mental status.  Reportedly, patient had complaint of shortness of breath and cough.  She was admitted to the hospital for COPD exacerbation.  There was also concern for CHF so she was given IV Lasix.  Creatinine went from 0.96-1.71 consistent with AKI.     Assessment/Plan:   Principal Problem:   COPD exacerbation (HCC) Active Problems:   Elevated troponin   Dementia with behavioral disturbance (HCC)   HTN, goal below 140/80   Prediabetes   Pressure injury of skin   AKI (acute kidney injury) ([place])    Body mass index is 26.07 kg/m.  Acute exacerbation of COPD: Continue his Theramycin, prednisone and bronchodilators.  She is tolerating room room air.    AKI: Creatinine on admission was 0.96.  Creatinine is down from 1.71-1.49.  Start IV Ringer's lactate with KCl at 50 mL/h and repeat BMP tomorrow.  Hypokalemia: Replete potassium and monitor levels  Elevated troponin: This is likely due to demand ischemia.  Dementia with behavioral disturbance: Continue rivastigmine, psychotropics and Klonopin  Hypertension: Continue antihypertensives    Diet Order             Diet regular Room service appropriate? Yes; Fluid consistency: Thin  Diet effective now                            Consultants: None  Procedures: None    Medications:    amLODipine  10 mg Oral Daily   arformoterol  15 mcg Nebulization BID   aspirin EC  81 mg Oral Daily   azithromycin  500 mg Oral q1600   bisoprolol  5 mg Oral Daily   budesonide (PULMICORT) nebulizer solution  0.25 mg Nebulization BID    buPROPion  150 mg Oral Daily   clonazePAM  1 mg Oral BID   DULoxetine  60 mg Oral Daily   enoxaparin (LOVENOX) injection  30 mg Subcutaneous Q24H   gabapentin  100 mg Oral BID   insulin aspart  0-15 Units Subcutaneous TID WC   ipratropium-albuterol  3 mL Nebulization Q6H   OXcarbazepine  150 mg Oral q morning   OXcarbazepine  300 mg Oral QHS   polyethylene glycol  17 g Oral Daily   predniSONE  40 mg Oral Q breakfast   rivastigmine  1.5 mg Oral BID   Continuous Infusions:  lactated ringers 1,000 mL with potassium chloride 20 mEq/L Pediatric IV infusion       Anti-infectives (From admission, onward)    Start     Dose/Rate Route Frequency Ordered Stop   01/10/22 1600  azithromycin (ZITHROMAX) tablet 500 mg        500 mg Oral Daily-1600 01/10/22 1420 01/12/22 1559   01/09/22 1600  azithromycin (ZITHROMAX) 500 mg in sodium chloride 0.9 % 250 mL IVPB  Status:  Discontinued        500 mg 250 mL/hr over 60 Minutes Intravenous Every 24 hours 01/08/22 1725 01/10/22 1419   01/09/22 1500  cefTRIAXone (ROCEPHIN) 2 g in sodium chloride 0.9 % 100 mL IVPB  Status:  Discontinued  2 g 200 mL/hr over 30 Minutes Intravenous Every 24 hours 01/08/22 1725 01/09/22 0829   01/08/22 1530  cefTRIAXone (ROCEPHIN) 1 g in sodium chloride 0.9 % 100 mL IVPB        1 g 200 mL/hr over 30 Minutes Intravenous  Once 01/08/22 1516 01/08/22 1631   01/08/22 1530  azithromycin (ZITHROMAX) 500 mg in sodium chloride 0.9 % 250 mL IVPB        500 mg 250 mL/hr over 60 Minutes Intravenous  Once 01/08/22 1516 01/08/22 1734              Family Communication/Anticipated D/C date and plan/Code Status   DVT prophylaxis: enoxaparin (LOVENOX) injection 30 mg Start: 01/09/22 2200     Code Status: DNR  Family Communication: None Disposition Plan: Plan to discharge to memory care unit tomorrow   Status is: Inpatient Remains inpatient appropriate because: AKI that requires IV fluids       Subjective:    Interval events noted.  She is confused and cannot provide any meaningful history.  No shortness of breath or chest pain  Objective:    Vitals:   01/10/22 0248 01/10/22 0450 01/10/22 0811 01/10/22 1331  BP:  (!) 143/47 (!) 115/100   Pulse:  70 79   Resp:      Temp:  97.8 F (36.6 C) 98.3 F (36.8 C)   TempSrc:  Oral    SpO2: 95% 99%  99%  Weight:      Height:       No data found.   Intake/Output Summary (Last 24 hours) at 01/10/2022 1546 Last data filed at 01/10/2022 1437 Gross per 24 hour  Intake 679.66 ml  Output 100 ml  Net 579.66 ml   Filed Weights   01/08/22 1236  Weight: 68.9 kg    Exam:  GEN: NAD SKIN: Warm and dry EYES: EOMI ENT: MMM CV: RRR PULM: CTA B ABD: soft, ND, NT, +BS CNS: AAO x 1 (person), non focal EXT: No edema or tenderness    Pressure Injury 01/08/22 Buttocks Left Stage 2 -  Partial thickness loss of dermis presenting as a shallow open injury with a red, pink wound bed without slough. (Active)  01/08/22 2224  Location: Buttocks  Location Orientation: Left  Staging: Stage 2 -  Partial thickness loss of dermis presenting as a shallow open injury with a red, pink wound bed without slough.  Wound Description (Comments):   Present on Admission: Yes  Dressing Type Foam - Lift dressing to assess site every shift 01/09/22 2130     Pressure Injury 01/08/22 Buttocks Right Stage 2 -  Partial thickness loss of dermis presenting as a shallow open injury with a red, pink wound bed without slough. 1 area possible due to abrasion (Active)  01/08/22 2224  Location: Buttocks  Location Orientation: Right  Staging: Stage 2 -  Partial thickness loss of dermis presenting as a shallow open injury with a red, pink wound bed without slough.  Wound Description (Comments): 1 area possible due to abrasion  Present on Admission: Yes  Dressing Type Foam - Lift dressing to assess site every shift 01/09/22 2130     Data Reviewed:   I have personally  reviewed following labs and imaging studies:  Labs: Labs show the following:   Basic Metabolic Panel: Recent Labs  Lab 01/08/22 1238 01/09/22 0401 01/10/22 0602  NA 133* 135 133*  K 3.3* 3.9 3.4*  CL 89* 94* 92*  CO2 32 28 30  GLUCOSE 141* 201* 138*  BUN 18 29* 44*  CREATININE 0.96 1.71* 1.49*  CALCIUM 9.3 8.9 9.0   GFR Estimated Creatinine Clearance: 27.8 mL/min (A) (by C-G formula based on SCr of 1.49 mg/dL (H)). Liver Function Tests: Recent Labs  Lab 01/08/22 1238  AST 21  ALT 13  ALKPHOS 77  BILITOT 0.8  PROT 7.5  ALBUMIN 4.0   No results for input(s): "LIPASE", "AMYLASE" in the last 168 hours. No results for input(s): "AMMONIA" in the last 168 hours. Coagulation profile No results for input(s): "INR", "PROTIME" in the last 168 hours.  CBC: Recent Labs  Lab 01/08/22 1238 01/09/22 0401  WBC 9.9 7.4  NEUTROABS  --  6.6  HGB 12.4 10.9*  HCT 37.9 33.0*  MCV 93.6 92.4  PLT 234 225   Cardiac Enzymes: No results for input(s): "CKTOTAL", "CKMB", "CKMBINDEX", "TROPONINI" in the last 168 hours. BNP (last 3 results) No results for input(s): "PROBNP" in the last 8760 hours. CBG: Recent Labs  Lab 01/09/22 0802 01/09/22 1207 01/09/22 2149 01/10/22 0813 01/10/22 1221  GLUCAP 178* 296* 175* 116* 175*   D-Dimer: No results for input(s): "DDIMER" in the last 72 hours. Hgb A1c: Recent Labs    01/08/22 2218  HGBA1C 6.1*   Lipid Profile: No results for input(s): "CHOL", "HDL", "LDLCALC", "TRIG", "CHOLHDL", "LDLDIRECT" in the last 72 hours. Thyroid function studies: No results for input(s): "TSH", "T4TOTAL", "T3FREE", "THYROIDAB" in the last 72 hours.  Invalid input(s): "FREET3" Anemia work up: No results for input(s): "VITAMINB12", "FOLATE", "FERRITIN", "TIBC", "IRON", "RETICCTPCT" in the last 72 hours. Sepsis Labs: Recent Labs  Lab 01/08/22 1238 01/08/22 1241 01/09/22 0401  PROCALCITON  --  <0.10  --   WBC 9.9  --  7.4    Microbiology Recent  Results (from the past 240 hour(s))  SARS Coronavirus 2 by RT PCR (hospital order, performed in [facility] hospital lab) *cepheid single result test* Anterior Nasal Swab     Status: None   Collection Time: 01/08/22  4:37 PM   Specimen: Anterior Nasal Swab  Result Value Ref Range Status   SARS Coronavirus 2 by RT PCR NEGATIVE NEGATIVE Final    Comment: (NOTE) SARS-CoV-2 target nucleic acids are NOT DETECTED.  The SARS-CoV-2 RNA is generally detectable in upper and lower respiratory specimens during the acute phase of infection. The lowest concentration of SARS-CoV-2 viral copies this assay can detect is 250 copies / mL. A negative result does not preclude SARS-CoV-2 infection and should not be used as the sole basis for treatment or other patient management decisions.  A negative result may occur with improper specimen collection / handling, submission of specimen other than nasopharyngeal swab, presence of viral mutation(s) within the areas targeted by this assay, and inadequate number of viral copies (<250 copies / mL). A negative result must be combined with clinical observations, patient history, and epidemiological information.  Fact Sheet for Patients:   https://www.patel.info/  Fact Sheet for Healthcare Providers: https://hall.com/  This test is not yet approved or  cleared by the [place] FDA and has been authorized for detection and/or diagnosis of SARS-CoV-2 by FDA under an Emergency Use Authorization (EUA).  This EUA will remain in effect (meaning this test can be used) for the duration of the COVID-19 declaration under Section 564(b)(1) of the Act, 21 U.S.C. section 360bbb-3(b)(1), unless the authorization is terminated or revoked sooner.  Performed at [facility], [address] [place],  [address]   Respiratory (~20 pathogens)  panel by PCR     Status: None   Collection Time: 01/08/22  5:42 PM    Specimen: Nasopharyngeal Swab; Respiratory  Result Value Ref Range Status   Adenovirus NOT DETECTED NOT DETECTED Final   Coronavirus 229E NOT DETECTED NOT DETECTED Final    Comment: (NOTE) The Coronavirus on the Respiratory Panel, DOES NOT test for the novel  Coronavirus (2019 nCoV)    Coronavirus HKU1 NOT DETECTED NOT DETECTED Final   Coronavirus NL63 NOT DETECTED NOT DETECTED Final   Coronavirus OC43 NOT DETECTED NOT DETECTED Final   Metapneumovirus NOT DETECTED NOT DETECTED Final   Rhinovirus / Enterovirus NOT DETECTED NOT DETECTED Final   Influenza A NOT DETECTED NOT DETECTED Final   Influenza B NOT DETECTED NOT DETECTED Final   Parainfluenza Virus 1 NOT DETECTED NOT DETECTED Final   Parainfluenza Virus 2 NOT DETECTED NOT DETECTED Final   Parainfluenza Virus 3 NOT DETECTED NOT DETECTED Final   Parainfluenza Virus 4 NOT DETECTED NOT DETECTED Final   Respiratory Syncytial Virus NOT DETECTED NOT DETECTED Final   Bordetella pertussis NOT DETECTED NOT DETECTED Final   Bordetella Parapertussis NOT DETECTED NOT DETECTED Final   Chlamydophila pneumoniae NOT DETECTED NOT DETECTED Final   Mycoplasma pneumoniae NOT DETECTED NOT DETECTED Final    Comment: Performed at [facility], [place]. [address], [place], [place] [address]    Procedures and diagnostic studies:  No results found.             LOS: 1 day   Aaren Krog  [facility]   Pager on [email or web address]. If 7PM-7AM, please contact night-coverage at [email or web address]     01/10/2022, 3:46 PM

## 2022-01-11 DIAGNOSIS — E162 Hypoglycemia, unspecified: Secondary | ICD-10-CM | POA: Diagnosis not present

## 2022-01-11 DIAGNOSIS — N179 Acute kidney failure, unspecified: Secondary | ICD-10-CM | POA: Diagnosis not present

## 2022-01-11 DIAGNOSIS — F03918 Unspecified dementia, unspecified severity, with other behavioral disturbance: Secondary | ICD-10-CM | POA: Diagnosis not present

## 2022-01-11 DIAGNOSIS — J441 Chronic obstructive pulmonary disease with (acute) exacerbation: Secondary | ICD-10-CM | POA: Diagnosis not present

## 2022-01-11 LAB — BASIC METABOLIC PANEL
Anion gap: 10 (ref 5–15)
BUN: 39 mg/dL — ABNORMAL HIGH (ref 8–23)
CO2: 31 mmol/L (ref 22–32)
Calcium: 9 mg/dL (ref 8.9–10.3)
Chloride: 98 mmol/L (ref 98–111)
Creatinine, Ser: 1.23 mg/dL — ABNORMAL HIGH (ref 0.44–1.00)
GFR, Estimated: 44 mL/min — ABNORMAL LOW (ref >60)
Glucose, Bld: 135 mg/dL — ABNORMAL HIGH (ref 70–99)
Potassium: 4.5 mmol/L (ref 3.5–5.1)
Sodium: 139 mmol/L (ref 135–145)

## 2022-01-11 LAB — GLUCOSE, CAPILLARY
Glucose-Capillary: 109 mg/dL — ABNORMAL HIGH (ref 70–99)
Glucose-Capillary: 201 mg/dL — ABNORMAL HIGH (ref 70–99)

## 2022-01-11 MED ORDER — PREDNISONE 20 MG PO TABS: 40.0000 mg | ORAL_TABLET | Freq: Every day | ORAL | 0 refills | Status: AC

## 2022-01-11 MED ORDER — INSULIN ASPART 100 UNIT/ML IJ SOLN
0.0000 [IU] | Freq: Three times a day (TID) | INTRAMUSCULAR | Status: DC
  Administered 2022-01-11: 3 [IU] via SUBCUTANEOUS
  Filled 2022-01-11: qty 1

## 2022-01-11 NOTE — TOC Transition Note (Signed)
Transition of Care [facility]) - CM/SW Discharge Note   Patient Details  Name: Samantha Carpenter MRN: [id number] Date of Birth: February 17, 1941  [facility] CM/SW Contact:  Beverly Sessions, RN Phone Number: 01/11/2022, 2:02 PM   Clinical Narrative:    Patient to discharge today Confirmed with Rachel Bo at the [facility] patient can return Fl2 and dc summary secure emailed to [employer] notified Meg with [employer] notified Facility to transport at 2pm Patient has been weaned to [facility]           Patient Goals and CMS Choice        Discharge Placement                       Discharge Plan and Services                                     Social Determinants of Health (SDOH) Interventions     Readmission Risk Interventions    01/09/2022    2:23 PM  Readmission Risk Prevention Plan  Transportation Screening Complete  Palliative Care Screening Not Applicable  Medication Review (RN Care Manager) Complete

## 2022-01-11 NOTE — Inpatient Diabetes Management (Signed)
Inpatient Diabetes Program Recommendations  AACE/ADA: New Consensus Statement on Inpatient Glycemic Control   Target Ranges:  Prepandial:   less than 140 mg/dL      Peak postprandial:   less than 180 mg/dL (1-2 hours)      Critically ill patients:  140 - 180 mg/dL    Latest Reference Range & Units 01/10/22 08:13 01/10/22 12:21 01/10/22 17:02 01/10/22 20:32 01/10/22 20:59 01/10/22 22:30  Glucose-Capillary 70 - 99 mg/dL 116 (H) 175 (H) 282 (H)  Novolog 8 units 38 (LL) 159 (H) 83   Review of Glycemic Control  Diabetes history: No Outpatient Diabetes medications: NA Current orders for Inpatient glycemic control: Novolog 0-15 units TID with meals; Prednisone 40 mg QAM  Inpatient Diabetes Program Recommendations:    Insulin: Glucose down to 38 mg/dl at 20:32 on 10/25 due to Novolog moderate correction.  Please decrease Novolog correction to 0-9 units TID with meals.  Thanks, Barnie Alderman, RN, MSN, [facility] Coordinator Inpatient Diabetes Program [phone] (Team Pager from 8am to [place])

## 2022-01-11 NOTE — Progress Notes (Signed)
Walk test. 89% on Room air while ambulating. 93% on 2 L while ambulating.

## 2022-01-11 NOTE — Discharge Summary (Addendum)
Physician Discharge Summary   Patient: Samantha Carpenter MRN: [id number] DOB: September 03, 1940  Admit date:     01/08/2022  Discharge date: 01/11/22  Discharge Physician: Jennye Boroughs   PCP: Idelle Crouch, MD   Recommendations at discharge:   Follow-up with physician at facility within 3 days of discharge or PCP within 1 week of discharge  Discharge Diagnoses: Principal Problem:   COPD exacerbation ([address]) Active Problems:   Elevated troponin   Dementia with behavioral disturbance (HCC)   HTN, goal below 140/80   Prediabetes   Pressure injury of skin   AKI (acute kidney injury) ([place])   Hypoglycemia  Resolved Problems:   * No resolved hospital problems. [facility] Course:  Samantha Carpenter is a 81 y.o. female with medical history significant for dementia, COPD, hypertension, CKD stage IIIa, prediabetes, hyperlipidemia, who presented to the hospital with altered mental status.  Reportedly, patient had complaints of shortness of breath and cough.   She was admitted to the hospital for COPD exacerbation.  There was also concern for CHF so she was given IV Lasix.  Unable to determine if she truly had CHF exacerbation.  Creatinine went from 0.96-1.71 consistent with AKI.  She was given IV fluids for hydration and creatinine has improved to 1.23, closer to baseline.  2D echo in September 2022 showed normal LV and RV systolic function, normal EF estimated at greater than 55%, moderate valvular regurgitation.  Unable to rule out exacerbation of chronic diastolic CHF.  Mildly elevated troponin was likely from demand ischemia.  Hospital course was complicated by an episode of hypoglycemia when her blood glucose level dropped to 38.  This was treated with IV dextrose.  Of note, she was on NovoLog sliding scale insulin for hyperglycemia prior to developing hypoglycemia.  Her condition has improved and she is deemed stable for discharge to the [facility] memory care unit.  I called her sister,Ms. Baxter Hire, at 9:34 AM today but there was no response.  I called her niece, Ms. Eugene Garnet, at 9:37 AM and at 12:06 PM but there was no response.            Consultants: None Procedures performed: None  Disposition: Assisted living Diet recommendation:  Discharge Diet Orders (From admission, onward)     Start     Ordered   01/11/22 0000  Diet - low sodium heart healthy        01/11/22 1153           Cardiac diet DISCHARGE MEDICATION: Allergies as of 01/11/2022       Reactions   Morphine And Related    Latex Rash   "I break out"        Medication List     TAKE these medications    Acetaminophen Extra Strength 500 MG Tabs Take 1 tablet by mouth every 4 (four) hours as needed (pain).   Advair Diskus 250-50 MCG/ACT Aepb Generic drug: fluticasone-salmeterol Inhale 1 puff into the lungs 2 (two) times daily.   albuterol 108 (90 Base) MCG/ACT inhaler Commonly known as: VENTOLIN HFA Inhale 2 puffs into the lungs every 6 (six) hours as needed for wheezing or shortness of breath.   amLODipine 10 MG tablet Commonly known as: NORVASC Take 1 tablet (10 mg total) by mouth daily.   aspirin EC 81 MG tablet Take 81 mg by mouth daily.   bisoprolol 5 MG tablet Commonly known as: ZEBETA Take 1 tablet (5 mg total) by mouth daily.  buPROPion 150 MG 24 hr tablet Commonly known as: WELLBUTRIN XL Take 150 mg by mouth daily.   clonazePAM 1 MG tablet Commonly known as: KLONOPIN Take 1 tablet (1 mg total) by mouth 2 (two) times daily.   DULoxetine 60 MG capsule Commonly known as: CYMBALTA Take 60 mg by mouth daily.   gabapentin 100 MG capsule Commonly known as: NEURONTIN Take 100 mg by mouth 2 (two) times daily.   haloperidol 2 MG tablet Commonly known as: HALDOL Take 1 tablet (2 mg total) by mouth every 6 (six) hours as needed for agitation.   ipratropium-albuterol 0.5-2.5 (3) MG/3ML Soln Commonly known as: DUONEB Take 3 mLs by nebulization in the morning, at  noon, and at bedtime.   losartan-hydrochlorothiazide 100-12.5 MG tablet Commonly known as: HYZAAR Take 1 tablet by mouth daily.   OXcarbazepine 150 MG tablet Commonly known as: TRILEPTAL Take 150-300 mg by mouth See admin instructions. Take 150 mg (1 tablet) by mouth every morning, and 300 mg (2 tablets) at bedtime.   polyethylene glycol 17 g packet Commonly known as: MIRALAX / GLYCOLAX Take 17 g by mouth daily.   predniSONE 20 MG tablet Commonly known as: DELTASONE Take 2 tablets (40 mg total) by mouth daily with breakfast for 2 days. Start taking on: January 12, 2022   rivastigmine 1.5 MG capsule Commonly known as: EXELON Take 1.5 mg by mouth 2 (two) times daily.               Discharge Care Instructions  (From admission, onward)           Start     Ordered   01/11/22 0000  Discharge wound care:       Comments: Apply Mepilex border to stage II decubitus ulcers on bilateral buttocks.  Avoid laying or sitting on your buttocks for prolonged period of time.   01/11/22 1153            Discharge Exam: Filed Weights   01/08/22 1236  Weight: 68.9 kg   GEN: NAD SKIN: Warm and dry EYES: No pallor or icterus ENT: MMM CV: RRR PULM: Occasional mild rhonchi.  No rales ABD: soft, ND, NT, +BS CNS: AAO x 1 (person), non focal EXT: No edema or tenderness   Condition at discharge: good  The results of significant diagnostics from this hospitalization (including imaging, microbiology, ancillary and laboratory) are listed below for reference.   Imaging Studies: DG Chest Portable 1 View  Result Date: 01/08/2022 CLINICAL DATA:  Shortness of breath EXAM: PORTABLE CHEST 1 VIEW COMPARISON:  Chest radiograph 11/13/2021 FINDINGS: Likely small bilateral pleural effusions. No pneumothorax. Unchanged cardiac and mediastinal contours. No focal airspace opacity. Compared to prior exam there is increased prominence of the interstitium, which can be seen in the setting of mild  pulmonary edema. No displaced rib fractures. IMPRESSION: Increased bilateral interstitial opacities could be seen in the setting of pulmonary edema or atypical infection. Possible small bilateral pleural effusions. Electronically Signed   By: Marin Roberts M.D.   On: 01/08/2022 14:50   CT Head Wo Contrast  Result Date: 01/08/2022 CLINICAL DATA:  Mental status change, unknown cause EXAM: CT HEAD WITHOUT CONTRAST TECHNIQUE: Contiguous axial images were obtained from the base of the skull through the vertex without intravenous contrast. RADIATION DOSE REDUCTION: This exam was performed according to the departmental dose-optimization program which includes automated exposure control, adjustment of the mA and/or kV according to patient size and/or use of iterative reconstruction technique. COMPARISON:  12/26/2021  FINDINGS: Brain: There is atrophy and chronic small vessel disease changes. No acute intracranial abnormality. Specifically, no hemorrhage, hydrocephalus, mass lesion, acute infarction, or significant intracranial injury. Vascular: No hyperdense vessel or unexpected calcification. Skull: No acute calvarial abnormality. Sinuses/Orbits: Air-fluid level in the left maxillary sinus. Mucosal thickening in scattered ethmoid air cells on the left maxillary sinus. Other: None IMPRESSION: Atrophy, chronic microvascular disease. No acute intracranial abnormality. 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 Electronically Signed   By: Rolm Baptise M.D.   On: 01/08/2022 14:24   CT Head Wo Contrast  Result Date: 12/26/2021 CLINICAL DATA:  Fall.  Head trauma. EXAM: CT HEAD WITHOUT CONTRAST CT CERVICAL SPINE WITHOUT CONTRAST TECHNIQUE: Multidetector CT imaging of the head and cervical spine was performed following the standard protocol without intravenous contrast. Multiplanar CT image reconstructions of the cervical spine were also generated. RADIATION DOSE REDUCTION: This exam was performed according to the departmental  dose-optimization program which includes automated exposure control, adjustment of the mA and/or kV according to patient size and/or use of iterative reconstruction technique. COMPARISON:  11/13/2021 FINDINGS: CT HEAD FINDINGS Brain: There is no evidence for acute hemorrhage, hydrocephalus, mass lesion, or abnormal extra-axial fluid collection. No definite CT evidence for acute infarction. Diffuse loss of parenchymal volume is consistent with atrophy. Patchy low attenuation in the deep hemispheric and periventricular white matter is nonspecific, but likely reflects chronic microvascular ischemic demyelination. Vascular: No hyperdense vessel or unexpected calcification. Skull: No evidence for fracture. No worrisome lytic or sclerotic lesion. Sinuses/Orbits: The visualized paranasal sinuses and mastoid air cells are clear. Visualized portions of the globes and intraorbital fat are unremarkable. Other: None. CT CERVICAL SPINE FINDINGS Alignment: Straightening of normal cervical lordosis without evidence for traumatic subluxation. Skull base and vertebrae: No acute fracture. No primary bone lesion or focal pathologic process. Soft tissues and spinal canal: No prevertebral fluid or swelling. No visible canal hematoma. Disc levels: Loss of disc height noted C4-5 and C5-6. Facets are well aligned bilaterally. Upper chest: Unremarkable. Other: Non. IMPRESSION: 1. No acute intracranial abnormality. 2. Atrophy with chronic small vessel ischemic disease. 3. No cervical spine fracture or traumatic subluxation. 4. Degenerative changes in the cervical spine as above. Electronically Signed   By: Misty Stanley M.D.   On: 12/26/2021 12:13   CT Cervical Spine Wo Contrast  Result Date: 12/26/2021 CLINICAL DATA:  Fall.  Head trauma. EXAM: CT HEAD WITHOUT CONTRAST CT CERVICAL SPINE WITHOUT CONTRAST TECHNIQUE: Multidetector CT imaging of the head and cervical spine was performed following the standard protocol without intravenous  contrast. Multiplanar CT image reconstructions of the cervical spine were also generated. RADIATION DOSE REDUCTION: This exam was performed according to the departmental dose-optimization program which includes automated exposure control, adjustment of the mA and/or kV according to patient size and/or use of iterative reconstruction technique. COMPARISON:  11/13/2021 FINDINGS: CT HEAD FINDINGS Brain: There is no evidence for acute hemorrhage, hydrocephalus, mass lesion, or abnormal extra-axial fluid collection. No definite CT evidence for acute infarction. Diffuse loss of parenchymal volume is consistent with atrophy. Patchy low attenuation in the deep hemispheric and periventricular white matter is nonspecific, but likely reflects chronic microvascular ischemic demyelination. Vascular: No hyperdense vessel or unexpected calcification. Skull: No evidence for fracture. No worrisome lytic or sclerotic lesion. Sinuses/Orbits: The visualized paranasal sinuses and mastoid air cells are clear. Visualized portions of the globes and intraorbital fat are unremarkable. Other: None. CT CERVICAL SPINE FINDINGS Alignment: Straightening of normal cervical lordosis without evidence for traumatic subluxation. Skull base and  vertebrae: No acute fracture. No primary bone lesion or focal pathologic process. Soft tissues and spinal canal: No prevertebral fluid or swelling. No visible canal hematoma. Disc levels: Loss of disc height noted C4-5 and C5-6. Facets are well aligned bilaterally. Upper chest: Unremarkable. Other: Non. IMPRESSION: 1. No acute intracranial abnormality. 2. Atrophy with chronic small vessel ischemic disease. 3. No cervical spine fracture or traumatic subluxation. 4. Degenerative changes in the cervical spine as above. Electronically Signed   By: Misty Stanley M.D.   On: 12/26/2021 12:13    Microbiology: Results for orders placed or performed during the hospital encounter of 01/08/22  SARS Coronavirus 2 by RT  PCR (hospital order, performed in [facility] hospital lab) *cepheid single result test* Anterior Nasal Swab     Status: None   Collection Time: 01/08/22  4:37 PM   Specimen: Anterior Nasal Swab  Result Value Ref Range Status   SARS Coronavirus 2 by RT PCR NEGATIVE NEGATIVE Final    Comment: (NOTE) SARS-CoV-2 target nucleic acids are NOT DETECTED.  The SARS-CoV-2 RNA is generally detectable in upper and lower respiratory specimens during the acute phase of infection. The lowest concentration of SARS-CoV-2 viral copies this assay can detect is 250 copies / mL. A negative result does not preclude SARS-CoV-2 infection and should not be used as the sole basis for treatment or other patient management decisions.  A negative result may occur with improper specimen collection / handling, submission of specimen other than nasopharyngeal swab, presence of viral mutation(s) within the areas targeted by this assay, and inadequate number of viral copies (<250 copies / mL). A negative result must be combined with clinical observations, patient history, and epidemiological information.  Fact Sheet for Patients:   https://www.patel.info/  Fact Sheet for Healthcare Providers: https://hall.com/  This test is not yet approved or  cleared by the [place] FDA and has been authorized for detection and/or diagnosis of SARS-CoV-2 by FDA under an Emergency Use Authorization (EUA).  This EUA will remain in effect (meaning this test can be used) for the duration of the COVID-19 declaration under Section 564(b)(1) of the Act, 21 U.S.C. section 360bbb-3(b)(1), unless the authorization is terminated or revoked sooner.  Performed at [facility], [place], [place] [address]   Respiratory (~20 pathogens) panel by PCR     Status: None   Collection Time: 01/08/22  5:42 PM   Specimen: Nasopharyngeal Swab; Respiratory  Result Value Ref  Range Status   Adenovirus NOT DETECTED NOT DETECTED Final   Coronavirus 229E NOT DETECTED NOT DETECTED Final    Comment: (NOTE) The Coronavirus on the Respiratory Panel, DOES NOT test for the novel  Coronavirus (2019 nCoV)    Coronavirus HKU1 NOT DETECTED NOT DETECTED Final   Coronavirus NL63 NOT DETECTED NOT DETECTED Final   Coronavirus OC43 NOT DETECTED NOT DETECTED Final   Metapneumovirus NOT DETECTED NOT DETECTED Final   Rhinovirus / Enterovirus NOT DETECTED NOT DETECTED Final   Influenza A NOT DETECTED NOT DETECTED Final   Influenza B NOT DETECTED NOT DETECTED Final   Parainfluenza Virus 1 NOT DETECTED NOT DETECTED Final   Parainfluenza Virus 2 NOT DETECTED NOT DETECTED Final   Parainfluenza Virus 3 NOT DETECTED NOT DETECTED Final   Parainfluenza Virus 4 NOT DETECTED NOT DETECTED Final   Respiratory Syncytial Virus NOT DETECTED NOT DETECTED Final   Bordetella pertussis NOT DETECTED NOT DETECTED Final   Bordetella Parapertussis NOT DETECTED NOT DETECTED Final   Chlamydophila pneumoniae  NOT DETECTED NOT DETECTED Final   Mycoplasma pneumoniae NOT DETECTED NOT DETECTED Final    Comment: Performed at [facility], [place] [address], [place], [place] [address]    Labs: CBC: Recent Labs  Lab 01/08/22 1238 01/09/22 0401  WBC 9.9 7.4  NEUTROABS  --  6.6  HGB 12.4 10.9*  HCT 37.9 33.0*  MCV 93.6 92.4  PLT [id number]   Basic Metabolic Panel: Recent Labs  Lab 01/08/22 1238 01/09/22 0401 01/10/22 0602 01/11/22 0549  NA 133* 135 133* 139  K 3.3* 3.9 3.4* 4.5  CL 89* 94* 92* 98  CO2 32 28 30 31   GLUCOSE 141* 201* 138* 135*  BUN 18 29* 44* 39*  CREATININE 0.96 1.71* 1.49* 1.23*  CALCIUM 9.3 8.9 9.0 9.0   Liver Function Tests: Recent Labs  Lab 01/08/22 1238  AST 21  ALT 13  ALKPHOS 77  BILITOT 0.8  PROT 7.5  ALBUMIN 4.0   CBG: Recent Labs  Lab 01/10/22 2032 01/10/22 2059 01/10/22 2230 01/11/22 0807 01/11/22 1139  GLUCAP 38* 159* 83 109* 201*     Discharge time spent: greater than 30 minutes.  Signed: Jennye Boroughs, MD [facility] 01/11/2022

## 2022-01-11 NOTE — NC FL2 (Signed)
[place] LEVEL OF CARE SCREENING TOOL     IDENTIFICATION  Patient Name: Samantha Carpenter Birthdate: May 23, 1940 Sex: female Admission Date (Current Location): 01/08/2022  [facility] and [place] Number:  Engineering geologist and Address:         Provider Number: [id number]  Attending Physician Name and Address:  Jennye Boroughs, MD  Relative Name and Phone Number:       Current Level of Care: Hospital Recommended Level of Care: Memory Care Prior Approval Number:    Date Approved/Denied:   PASRR Number:    Discharge Plan:      Current Diagnoses: Patient Active Problem List   Diagnosis Date Noted   Hypoglycemia 01/11/2022   AKI (acute kidney injury) ([place]) 01/10/2022   Pressure injury of skin 01/09/2022   COPD exacerbation ([place]) 01/08/2022   Prediabetes 01/08/2022   Dementia with behavioral disturbance ([place]) 11/15/2021   Hypoxia 11/15/2021   COVID-19 virus infection 11/14/2021   Right rib fracture 11/09/2020   Fall at home, initial encounter 11/09/2020   COPD (chronic obstructive pulmonary disease) ([place]) 11/09/2020   Depression with anxiety 11/09/2020   Elevated troponin 11/09/2020   Acute respiratory failure with hypoxia ([place]) 11/09/2020   HTN (hypertension) 11/09/2020   Abrasions of multiple sites    Pure hypercholesterolemia 06/09/2018   Recurrent major depressive disorder, 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   HTN, goal below 140/80 04/28/2018   CKD (chronic kidney disease) stage 3, GFR 30-59 ml/min ([place]) 04/28/2018    Orientation RESPIRATION BLADDER Height & Weight     Self, Place  Normal Incontinent Weight: 68.9 kg (on 12/26/2021) Height:  5\' 4"  (162.6 cm)  BEHAVIORAL SYMPTOMS/MOOD NEUROLOGICAL BOWEL NUTRITION STATUS      Continent Diet (low sodium heart healthy)  AMBULATORY STATUS COMMUNICATION OF NEEDS Skin   Limited Assist Verbally PU Stage and Appropriate Care                        Personal Care Assistance Level of Assistance              Functional Limitations Info             SPECIAL CARE FACTORS FREQUENCY  PT (By licensed PT), OT (By licensed OT)     PT Frequency: [facility] OT Frequency: [facility]            Contractures Contractures Info: Not present    Additional Factors Info  Code Status, Allergies Code Status Info: DNR Allergies Info: Morphine and related, latex           TAKE these medications     Acetaminophen Extra Strength 500 MG Tabs Take 1 tablet by mouth every 4 (four) hours as needed (pain).    Advair Diskus 250-50 MCG/ACT Aepb Generic drug: fluticasone-salmeterol Inhale 1 puff into the lungs 2 (two) times daily.    albuterol 108 (90 Base) MCG/ACT inhaler Commonly known as: VENTOLIN HFA Inhale 2 puffs into the lungs every 6 (six) hours as needed for wheezing or shortness of breath.    amLODipine 10 MG tablet Commonly known as: NORVASC Take 1 tablet (10 mg total) by mouth daily.    aspirin EC 81 MG tablet Take 81 mg by mouth daily.    bisoprolol 5 MG tablet Commonly known as: ZEBETA Take 1 tablet (5 mg total) by mouth daily.    buPROPion 150 MG 24 hr tablet Commonly known as: WELLBUTRIN  XL Take 150 mg by mouth daily.    clonazePAM 1 MG tablet Commonly known as: KLONOPIN Take 1 tablet (1 mg total) by mouth 2 (two) times daily.    DULoxetine 60 MG capsule Commonly known as: CYMBALTA Take 60 mg by mouth daily.    gabapentin 100 MG capsule Commonly known as: NEURONTIN Take 100 mg by mouth 2 (two) times daily.    haloperidol 2 MG tablet Commonly known as: HALDOL Take 1 tablet (2 mg total) by mouth every 6 (six) hours as needed for agitation.    ipratropium-albuterol 0.5-2.5 (3) MG/3ML Soln Commonly known as: DUONEB Take 3 mLs by nebulization in the morning, at noon, and at bedtime.    losartan-hydrochlorothiazide 100-12.5 MG tablet Commonly known as: HYZAAR Take 1 tablet by  mouth daily.    OXcarbazepine 150 MG tablet Commonly known as: TRILEPTAL Take 150-300 mg by mouth See admin instructions. Take 150 mg (1 tablet) by mouth every morning, and 300 mg (2 tablets) at bedtime.    polyethylene glycol 17 g packet Commonly known as: MIRALAX / GLYCOLAX Take 17 g by mouth daily.    predniSONE 20 MG tablet Commonly known as: DELTASONE Take 2 tablets (40 mg total) by mouth daily with breakfast for 2 days. Start taking on: January 12, 2022    rivastigmine 1.5 MG capsule Commonly known as: EXELON Take 1.5 mg by mouth 2 (two) times daily.  Relevant Imaging Results:  Relevant Lab Results:   Additional Information SS#: [id number]  Beverly Sessions, RN

## 2022-01-11 NOTE — Progress Notes (Signed)
Mobility Specialist - Progress Note   01/11/22 1000  Mobility  Activity Ambulated with assistance in room;Stood at bedside  Level of Assistance Minimal assist, patient does 75% or more  Assistive Device None  Distance Ambulated (ft) 4 ft  Activity Response Tolerated fair  $Mobility charge 1 Mobility     Responded to bed alarm. Upon arrival, pt standing at bedside attempting to ambulate to window to open blinds. Pt with very poor safety awareness---attempting to ambulate without safe pathway clearance and without concern for IV lines/tubes. Attempts to grab item from floor again without safe clearance. Pt very impulsive but follows commands fairly well. SOB and fatigued with activity. Pt assisted back to bed; items picked off floor and blinds open by author. Pt able to pull self up to [facility] without assistance. Pt left in bed and quickly returns to sleep with alarm set, floor mats in place, needs in reach.    Kathee Delton Mobility Specialist 01/11/22, 10:23 AM

## 2022-01-11 NOTE — Progress Notes (Signed)
Report called to [facility]. Patient left via wheelchair with transportation provided per facility.

## 2022-02-10 ENCOUNTER — Emergency Department: Payer: Medicare HMO

## 2022-02-10 ENCOUNTER — Inpatient Hospital Stay
Admission: EM | Admit: 2022-02-10 | Discharge: 2022-02-19 | DRG: 193 | Disposition: A | Discharge status: Home / Self Care | Payer: Medicare HMO | Source: Skilled Nursing Facility | Attending: Internal Medicine | Admitting: Internal Medicine

## 2022-02-10 ENCOUNTER — Other Ambulatory Visit: Payer: Self-pay

## 2022-02-10 DIAGNOSIS — F0393 Unspecified dementia, unspecified severity, with mood disturbance: Secondary | ICD-10-CM | POA: Diagnosis present

## 2022-02-10 DIAGNOSIS — Z79899 Other long term (current) drug therapy: Secondary | ICD-10-CM

## 2022-02-10 DIAGNOSIS — Z7951 Long term (current) use of inhaled steroids: Secondary | ICD-10-CM

## 2022-02-10 DIAGNOSIS — J9621 Acute and chronic respiratory failure with hypoxia: Secondary | ICD-10-CM | POA: Diagnosis present

## 2022-02-10 DIAGNOSIS — F03911 Unspecified dementia, unspecified severity, with agitation: Secondary | ICD-10-CM | POA: Diagnosis present

## 2022-02-10 DIAGNOSIS — F329 Major depressive disorder, single episode, unspecified: Secondary | ICD-10-CM | POA: Diagnosis present

## 2022-02-10 DIAGNOSIS — G40909 Epilepsy, unspecified, not intractable, without status epilepticus: Secondary | ICD-10-CM | POA: Diagnosis present

## 2022-02-10 DIAGNOSIS — J431 Panlobular emphysema: Secondary | ICD-10-CM | POA: Diagnosis present

## 2022-02-10 DIAGNOSIS — Z9981 Dependence on supplemental oxygen: Secondary | ICD-10-CM | POA: Diagnosis not present

## 2022-02-10 DIAGNOSIS — K769 Liver disease, unspecified: Secondary | ICD-10-CM | POA: Diagnosis present

## 2022-02-10 DIAGNOSIS — E785 Hyperlipidemia, unspecified: Secondary | ICD-10-CM | POA: Diagnosis present

## 2022-02-10 DIAGNOSIS — J440 Chronic obstructive pulmonary disease with acute lower respiratory infection: Secondary | ICD-10-CM | POA: Diagnosis present

## 2022-02-10 DIAGNOSIS — T502X5A Adverse effect of carbonic-anhydrase inhibitors, benzothiadiazides and other diuretics, initial encounter: Secondary | ICD-10-CM | POA: Diagnosis present

## 2022-02-10 DIAGNOSIS — J121 Respiratory syncytial virus pneumonia: Secondary | ICD-10-CM | POA: Diagnosis present

## 2022-02-10 DIAGNOSIS — E876 Hypokalemia: Secondary | ICD-10-CM | POA: Diagnosis present

## 2022-02-10 DIAGNOSIS — G629 Polyneuropathy, unspecified: Secondary | ICD-10-CM | POA: Diagnosis present

## 2022-02-10 DIAGNOSIS — Y92128 Other place in nursing home as the place of occurrence of the external cause: Secondary | ICD-10-CM

## 2022-02-10 DIAGNOSIS — N1832 Chronic kidney disease, stage 3b: Secondary | ICD-10-CM | POA: Diagnosis present

## 2022-02-10 DIAGNOSIS — Z90710 Acquired absence of both cervix and uterus: Secondary | ICD-10-CM

## 2022-02-10 DIAGNOSIS — F0394 Unspecified dementia, unspecified severity, with anxiety: Secondary | ICD-10-CM | POA: Diagnosis present

## 2022-02-10 DIAGNOSIS — Z1152 Encounter for screening for COVID-19: Secondary | ICD-10-CM | POA: Diagnosis not present

## 2022-02-10 DIAGNOSIS — J441 Chronic obstructive pulmonary disease with (acute) exacerbation: Secondary | ICD-10-CM | POA: Diagnosis present

## 2022-02-10 DIAGNOSIS — F03918 Unspecified dementia, unspecified severity, with other behavioral disturbance: Secondary | ICD-10-CM | POA: Diagnosis present

## 2022-02-10 DIAGNOSIS — W1830XA Fall on same level, unspecified, initial encounter: Secondary | ICD-10-CM | POA: Diagnosis present

## 2022-02-10 DIAGNOSIS — J9622 Acute and chronic respiratory failure with hypercapnia: Secondary | ICD-10-CM | POA: Diagnosis present

## 2022-02-10 DIAGNOSIS — N179 Acute kidney failure, unspecified: Secondary | ICD-10-CM | POA: Diagnosis present

## 2022-02-10 DIAGNOSIS — S0003XA Contusion of scalp, initial encounter: Secondary | ICD-10-CM | POA: Diagnosis present

## 2022-02-10 DIAGNOSIS — J9601 Acute respiratory failure with hypoxia: Secondary | ICD-10-CM

## 2022-02-10 DIAGNOSIS — Z91040 Latex allergy status: Secondary | ICD-10-CM

## 2022-02-10 DIAGNOSIS — Z87891 Personal history of nicotine dependence: Secondary | ICD-10-CM | POA: Diagnosis not present

## 2022-02-10 DIAGNOSIS — Z7982 Long term (current) use of aspirin: Secondary | ICD-10-CM

## 2022-02-10 DIAGNOSIS — R296 Repeated falls: Secondary | ICD-10-CM | POA: Diagnosis present

## 2022-02-10 DIAGNOSIS — Z885 Allergy status to narcotic agent status: Secondary | ICD-10-CM

## 2022-02-10 DIAGNOSIS — I440 Atrioventricular block, first degree: Secondary | ICD-10-CM | POA: Diagnosis present

## 2022-02-10 DIAGNOSIS — I10 Essential (primary) hypertension: Secondary | ICD-10-CM | POA: Diagnosis present

## 2022-02-10 DIAGNOSIS — Z818 Family history of other mental and behavioral disorders: Secondary | ICD-10-CM

## 2022-02-10 DIAGNOSIS — I129 Hypertensive chronic kidney disease with stage 1 through stage 4 chronic kidney disease, or unspecified chronic kidney disease: Secondary | ICD-10-CM | POA: Diagnosis present

## 2022-02-10 DIAGNOSIS — Z9049 Acquired absence of other specified parts of digestive tract: Secondary | ICD-10-CM

## 2022-02-10 DIAGNOSIS — Z66 Do not resuscitate: Secondary | ICD-10-CM | POA: Diagnosis present

## 2022-02-10 DIAGNOSIS — F418 Other specified anxiety disorders: Secondary | ICD-10-CM | POA: Diagnosis present

## 2022-02-10 LAB — RESP PANEL BY RT-PCR (FLU A&B, COVID) ARPGX2
Influenza A by PCR: NEGATIVE
Influenza B by PCR: NEGATIVE
SARS Coronavirus 2 by RT PCR: NEGATIVE

## 2022-02-10 LAB — CBC WITH DIFFERENTIAL/PLATELET
Abs Immature Granulocytes: 0.02 10*3/uL (ref 0.00–0.07)
Basophils Absolute: 0 10*3/uL (ref 0.0–0.1)
Basophils Relative: 0 %
Eosinophils Absolute: 0.3 10*3/uL (ref 0.0–0.5)
Eosinophils Relative: 4 %
HCT: 36 % (ref 36.0–46.0)
Hemoglobin: 11.9 g/dL — ABNORMAL LOW (ref 12.0–15.0)
Immature Granulocytes: 0 %
Lymphocytes Relative: 26 %
Lymphs Abs: 1.5 10*3/uL (ref 0.7–4.0)
MCH: 30.7 pg (ref 26.0–34.0)
MCHC: 33.1 g/dL (ref 30.0–36.0)
MCV: 92.8 fL (ref 80.0–100.0)
Monocytes Absolute: 0.5 10*3/uL (ref 0.1–1.0)
Monocytes Relative: 9 %
Neutro Abs: 3.6 10*3/uL (ref 1.7–7.7)
Neutrophils Relative %: 61 %
Platelets: 230 10*3/uL (ref 150–400)
RBC: 3.88 MIL/uL (ref 3.87–5.11)
RDW: 13.3 % (ref 11.5–15.5)
WBC: 6 10*3/uL (ref 4.0–10.5)
nRBC: 0 % (ref 0.0–0.2)

## 2022-02-10 LAB — COMPREHENSIVE METABOLIC PANEL
ALT: 13 U/L (ref 0–44)
AST: 21 U/L (ref 15–41)
Albumin: 4.1 g/dL (ref 3.5–5.0)
Alkaline Phosphatase: 66 U/L (ref 38–126)
Anion gap: 14 (ref 5–15)
BUN: 30 mg/dL — ABNORMAL HIGH (ref 8–23)
CO2: 30 mmol/L (ref 22–32)
Calcium: 8.9 mg/dL (ref 8.9–10.3)
Chloride: 91 mmol/L — ABNORMAL LOW (ref 98–111)
Creatinine, Ser: 2 mg/dL — ABNORMAL HIGH (ref 0.44–1.00)
GFR, Estimated: 24 mL/min — ABNORMAL LOW (ref >60)
Glucose, Bld: 141 mg/dL — ABNORMAL HIGH (ref 70–99)
Potassium: 3.2 mmol/L — ABNORMAL LOW (ref 3.5–5.1)
Sodium: 135 mmol/L (ref 135–145)
Total Bilirubin: 0.6 mg/dL (ref 0.3–1.2)
Total Protein: 7.4 g/dL (ref 6.5–8.1)

## 2022-02-10 LAB — CK: Total CK: 69 U/L (ref 38–234)

## 2022-02-10 LAB — MAGNESIUM: Magnesium: 1.7 mg/dL (ref 1.7–2.4)

## 2022-02-10 MED ORDER — ONDANSETRON HCL 4 MG PO TABS: 4.0000 mg | ORAL_TABLET | Freq: Four times a day (QID) | ORAL | Status: DC | PRN

## 2022-02-10 MED ORDER — GABAPENTIN 100 MG PO CAPS
100.0000 mg | ORAL_CAPSULE | Freq: Two times a day (BID) | ORAL | Status: DC
  Administered 2022-02-10 – 2022-02-19 (×16): 100 mg via ORAL
  Filled 2022-02-10 (×16): qty 1

## 2022-02-10 MED ORDER — SODIUM CHLORIDE 0.9 % IV BOLUS
500.0000 mL | Freq: Once | INTRAVENOUS | Status: AC
  Administered 2022-02-10: 500 mL via INTRAVENOUS

## 2022-02-10 MED ORDER — SODIUM CHLORIDE 0.9% FLUSH
3.0000 mL | Freq: Two times a day (BID) | INTRAVENOUS | Status: DC
  Administered 2022-02-10 – 2022-02-19 (×13): 3 mL via INTRAVENOUS

## 2022-02-10 MED ORDER — ONDANSETRON HCL 4 MG/2ML IJ SOLN
4.0000 mg | Freq: Four times a day (QID) | INTRAMUSCULAR | Status: DC | PRN
  Administered 2022-02-12: 4 mg via INTRAVENOUS
  Filled 2022-02-10: qty 2

## 2022-02-10 MED ORDER — ACETAMINOPHEN 650 MG RE SUPP: 650.0000 mg | Freq: Four times a day (QID) | RECTAL | Status: DC | PRN

## 2022-02-10 MED ORDER — OXCARBAZEPINE 150 MG PO TABS: 150.0000 mg | ORAL_TABLET | ORAL | Status: DC

## 2022-02-10 MED ORDER — SODIUM CHLORIDE 0.9% FLUSH: 3.0000 mL | INTRAVENOUS | Status: DC | PRN

## 2022-02-10 MED ORDER — BUPROPION HCL ER (XL) 150 MG PO TB24
150.0000 mg | ORAL_TABLET | Freq: Every day | ORAL | Status: DC
  Administered 2022-02-12 – 2022-02-19 (×8): 150 mg via ORAL
  Filled 2022-02-10 (×8): qty 1

## 2022-02-10 MED ORDER — PREDNISONE 20 MG PO TABS
40.0000 mg | ORAL_TABLET | Freq: Every day | ORAL | Status: DC
  Administered 2022-02-12 – 2022-02-13 (×2): 40 mg via ORAL
  Filled 2022-02-10 (×2): qty 2

## 2022-02-10 MED ORDER — IPRATROPIUM-ALBUTEROL 0.5-2.5 (3) MG/3ML IN SOLN
3.0000 mL | Freq: Four times a day (QID) | RESPIRATORY_TRACT | Status: DC
  Administered 2022-02-10 – 2022-02-12 (×9): 3 mL via RESPIRATORY_TRACT
  Filled 2022-02-10 (×10): qty 3

## 2022-02-10 MED ORDER — ENOXAPARIN SODIUM 30 MG/0.3ML IJ SOSY
30.0000 mg | PREFILLED_SYRINGE | INTRAMUSCULAR | Status: DC
  Administered 2022-02-10: 30 mg via SUBCUTANEOUS
  Filled 2022-02-10: qty 0.3

## 2022-02-10 MED ORDER — METHYLPREDNISOLONE SODIUM SUCC 40 MG IJ SOLR
40.0000 mg | Freq: Two times a day (BID) | INTRAMUSCULAR | Status: AC
  Administered 2022-02-10 – 2022-02-11 (×2): 40 mg via INTRAVENOUS
  Filled 2022-02-10 (×2): qty 1

## 2022-02-10 MED ORDER — ACETAMINOPHEN EXTRA STRENGTH 500 MG PO TABS: 1.0000 | ORAL_TABLET | ORAL | Status: DC | PRN

## 2022-02-10 MED ORDER — RIVASTIGMINE TARTRATE 1.5 MG PO CAPS
1.5000 mg | ORAL_CAPSULE | Freq: Two times a day (BID) | ORAL | Status: DC
  Administered 2022-02-10 – 2022-02-19 (×15): 1.5 mg via ORAL
  Filled 2022-02-10 (×19): qty 1

## 2022-02-10 MED ORDER — ASPIRIN 81 MG PO TBEC
81.0000 mg | DELAYED_RELEASE_TABLET | Freq: Every day | ORAL | Status: DC
  Administered 2022-02-12 – 2022-02-19 (×8): 81 mg via ORAL
  Filled 2022-02-10 (×8): qty 1

## 2022-02-10 MED ORDER — HALOPERIDOL 2 MG PO TABS: 2.0000 mg | ORAL_TABLET | Freq: Four times a day (QID) | ORAL | Status: DC | PRN

## 2022-02-10 MED ORDER — ACETAMINOPHEN 325 MG PO TABS
650.0000 mg | ORAL_TABLET | Freq: Four times a day (QID) | ORAL | Status: DC | PRN
  Administered 2022-02-11 – 2022-02-16 (×2): 650 mg via ORAL
  Filled 2022-02-10 (×2): qty 2

## 2022-02-10 MED ORDER — AMLODIPINE BESYLATE 10 MG PO TABS
10.0000 mg | ORAL_TABLET | Freq: Every day | ORAL | Status: DC
  Administered 2022-02-12 – 2022-02-19 (×8): 10 mg via ORAL
  Filled 2022-02-10 (×9): qty 1

## 2022-02-10 MED ORDER — SODIUM CHLORIDE 0.9 % IV SOLN: 250.0000 mL | INTRAVENOUS | Status: DC | PRN

## 2022-02-10 MED ORDER — DULOXETINE HCL 30 MG PO CPEP
60.0000 mg | ORAL_CAPSULE | Freq: Every day | ORAL | Status: DC
  Administered 2022-02-12 – 2022-02-19 (×8): 60 mg via ORAL
  Filled 2022-02-10 (×8): qty 2

## 2022-02-10 MED ORDER — POTASSIUM CHLORIDE IN NACL 40-0.9 MEQ/L-% IV SOLN
INTRAVENOUS | Status: DC
  Filled 2022-02-10 (×8): qty 1000

## 2022-02-10 MED ORDER — POLYETHYLENE GLYCOL 3350 17 G PO PACK
17.0000 g | PACK | Freq: Every day | ORAL | Status: DC
  Administered 2022-02-14 – 2022-02-17 (×3): 17 g via ORAL
  Filled 2022-02-10 (×3): qty 1

## 2022-02-10 MED ORDER — OXCARBAZEPINE 300 MG PO TABS
300.0000 mg | ORAL_TABLET | Freq: Every day | ORAL | Status: DC
  Administered 2022-02-10 – 2022-02-18 (×8): 300 mg via ORAL
  Filled 2022-02-10 (×9): qty 1

## 2022-02-10 MED ORDER — OXCARBAZEPINE 150 MG PO TABS
150.0000 mg | ORAL_TABLET | Freq: Every morning | ORAL | Status: DC
  Administered 2022-02-12 – 2022-02-19 (×8): 150 mg via ORAL
  Filled 2022-02-10 (×8): qty 1

## 2022-02-10 MED ORDER — MOMETASONE FURO-FORMOTEROL FUM 200-5 MCG/ACT IN AERO
2.0000 | INHALATION_SPRAY | Freq: Two times a day (BID) | RESPIRATORY_TRACT | Status: DC
  Administered 2022-02-10 – 2022-02-19 (×11): 2 via RESPIRATORY_TRACT
  Filled 2022-02-10 (×2): qty 8.8

## 2022-02-10 MED ORDER — CLONAZEPAM 1 MG PO TABS
1.0000 mg | ORAL_TABLET | Freq: Two times a day (BID) | ORAL | Status: DC
  Administered 2022-02-10 – 2022-02-15 (×8): 1 mg via ORAL
  Filled 2022-02-10 (×4): qty 1
  Filled 2022-02-10: qty 2
  Filled 2022-02-10 (×3): qty 1

## 2022-02-10 MED ORDER — IPRATROPIUM-ALBUTEROL 0.5-2.5 (3) MG/3ML IN SOLN
3.0000 mL | Freq: Once | RESPIRATORY_TRACT | Status: AC
  Administered 2022-02-10: 3 mL via RESPIRATORY_TRACT
  Filled 2022-02-10: qty 3

## 2022-02-10 NOTE — Assessment & Plan Note (Addendum)
Continue bupropion and Klonopin.

## 2022-02-10 NOTE — Assessment & Plan Note (Addendum)
Patient with known history of COPD Seen by pulmonary

## 2022-02-10 NOTE — Assessment & Plan Note (Addendum)
Patient has had 2 falls before admission Patient noted to have a left frontal scalp contusion with small hematoma measuring 5 mm in thickness from her recent fall Fall precautions PT, OT recommend ALF with home health

## 2022-02-10 NOTE — Assessment & Plan Note (Addendum)
Continue amlodipine & losartan Hold bisoprolol due to bradycardia with prolonged PR interval Hold HCTZ for now

## 2022-02-10 NOTE — ED Notes (Signed)
Called lab to add on CK 

## 2022-02-10 NOTE — Progress Notes (Signed)
PHARMACIST - PHYSICIAN COMMUNICATION  CONCERNING:  Enoxaparin (Lovenox) for DVT Prophylaxis    RECOMMENDATION: Patient was prescribed enoxaprin 40mg  q24 hours for VTE prophylaxis.   Filed Weights   02/10/22 1617  Weight: 68 kg (150 lb)    Body mass index is 25.75 kg/m.  Estimated Creatinine Clearance: 20.5 mL/min (A) (by C-G formula based on SCr of 2 mg/dL (H)).   Patient is candidate for enoxaparin 30mg  every 24 hours based on CrCl <22ml/min or Weight <45kg  DESCRIPTION: Pharmacy has adjusted enoxaparin dose per [facility] policy.  Patient is now receiving enoxaparin 30 mg every 24 hours    31m, PharmD Clinical Pharmacist  02/10/2022 4:19 PM

## 2022-02-10 NOTE — Assessment & Plan Note (Addendum)
Patient with a known history of COPD who presents to the ER for evaluation of worsening shortness of breath from her baseline with increased work of breathing initially requiring BiPAP.  Now on 2 L oxygen.  Wean as able Continue bronchodilator therapy

## 2022-02-10 NOTE — ED Notes (Signed)
Pt back from CT- bipap placed back on

## 2022-02-10 NOTE — Assessment & Plan Note (Addendum)
Patient has a baseline serum creatinine of 1.23 and today on admission it is 2.0 AKI appears to be prerenal and related to diuretic use Held hydrochlorothiazide and losartan initially.  Losartan restarted now Resolved with hydration  Lab Results  Component Value Date   CREATININE 0.98 02/17/2022   CREATININE 0.88 02/16/2022   CREATININE 1.14 (H) 02/15/2022

## 2022-02-10 NOTE — ED Triage Notes (Addendum)
Pt from the [facility] via EMS- pt was found with head down at dining hall with [facility] and more altered than normally. Pt states she fell last night and hit head as well per EMS. Audible wheeze noted throughout. Pt has history of COPD and emphysema. Pt alert, oriented to self and situation only. Pt cooperative and calm. Pt on non-rebreather on arrival.  125 mg solu medrol 2 nebulizer 187 BGL

## 2022-02-10 NOTE — Assessment & Plan Note (Addendum)
Secondary to diuretic therapy Repleted and resolved

## 2022-02-10 NOTE — Assessment & Plan Note (Addendum)
Secondary to acute COPD exacerbation Patient presented for evaluation of worsening shortness of breath from her baseline and had increased work of breathing initially requiring BiPAP She is weaned off BiPAP.  Now she is on 2 L oxygen we will continue weaning efforts

## 2022-02-10 NOTE — Assessment & Plan Note (Addendum)
Continue Exelon and as needed Haldol.

## 2022-02-10 NOTE — H&P (Addendum)
History and Physical    Patient: Samantha Carpenter ERD:[id number] DOB: 1941-01-24 DOA: 02/10/2022 DOS: the patient was seen and examined on 02/10/2022 PCP: Pcp, No  Patient coming from: ALF  Chief Complaint:  Chief Complaint  Patient presents with   Shortness of Breath   Most of the history obtained from EMR as patient is unable to provide any history. HPI: Samantha Carpenter is a 81 y.o. female with medical history significant for COPD, hypertension, stage IIIa chronic kidney disease, dementia, dyslipidemia who presents to the ER via EMS for evaluation after she was found with her head down in the dining hall and appeared to be more short of breath when compared to her baseline. She also had a fall the night prior to her admission and hit her head. Per EMS, she was noted to have audible wheezes and received Solu-Medrol 125 mg IV and 2 nebulizer treatments prior to being transported to the ER. She was noted to be more altered compared to her baseline and tachypneic and was placed on a BiPAP. Labs noted and significant for worsening renal function from her baseline, 1.23 >> 2.0 as well as hypokalemia. She received 500 cc fluid bolus in the ER as well as nebulizer treatment and will be admitted to the hospital for further evaluation.     Review of Systems: unable to review all systems due to the inability of the patient to answer questions. Past Medical History:  Diagnosis Date   Chronic kidney disease    COPD (chronic obstructive pulmonary disease) ([place])    Dementia ([place])    Depression    Hypertension    Liver disease    Past Surgical History:  Procedure Laterality Date   ABDOMINAL HYSTERECTOMY     CHOLECYSTECTOMY     ESOPHAGOGASTRODUODENOSCOPY N/A 11/20/2019   Procedure: ESOPHAGOGASTRODUODENOSCOPY (EGD);  Surgeon: Lesly Rubenstein, MD;  [facility] ENDOSCOPY;  Service: Gastroenterology;  Laterality: N/A;   face tuck     Social History:  reports that she quit smoking about 12  years ago. She has never used smokeless tobacco. She reports that she does not currently use alcohol. She reports that she does not currently use drugs.  Allergies  Allergen Reactions   Morphine And Related    Latex Rash    "I break out"    Family History  Problem Relation Age of Onset   Bipolar disorder Brother    Schizophrenia Brother    Bipolar disorder Sister    Schizophrenia Sister    Drug abuse Son     Prior to Admission medications   Medication Sig Start Date End Date Taking? Authorizing Provider  Acetaminophen Extra Strength 500 MG TABS Take 1 tablet by mouth every 4 (four) hours as needed (pain). 10/31/21   [provider]  ADVAIR DISKUS 250-50 MCG/ACT AEPB Inhale 1 puff into the lungs 2 (two) times daily. 10/23/21   [provider]  albuterol (VENTOLIN HFA) 108 (90 Base) MCG/ACT inhaler Inhale 2 puffs into the lungs every 6 (six) hours as needed for wheezing or shortness of breath. 05/05/20   [provider]  amLODipine (NORVASC) 10 MG tablet Take 1 tablet (10 mg total) by mouth daily. 11/17/21   Loletha Grayer, MD  aspirin EC 81 MG tablet Take 81 mg by mouth daily.    [provider]  bisoprolol (ZEBETA) 5 MG tablet Take 1 tablet (5 mg total) by mouth daily. 11/17/21   Loletha Grayer, MD  buPROPion (WELLBUTRIN XL) 150 MG 24 hr tablet  Take 150 mg by mouth daily. 10/27/19   [provider]  clonazePAM (KLONOPIN) 1 MG tablet Take 1 tablet (1 mg total) by mouth 2 (two) times daily. 11/16/21   Loletha Grayer, MD  DULoxetine (CYMBALTA) 60 MG capsule Take 60 mg by mouth daily. 08/25/18   [provider]  gabapentin (NEURONTIN) 100 MG capsule Take 100 mg by mouth 2 (two) times daily. 06/18/18   [provider]  haloperidol (HALDOL) 2 MG tablet Take 1 tablet (2 mg total) by mouth every 6 (six) hours as needed for agitation. 11/16/21   Loletha Grayer, MD  ipratropium-albuterol (DUONEB) 0.5-2.5 (3) MG/3ML SOLN Take 3 mLs by  nebulization in the morning, at noon, and at bedtime. 12/28/21   [provider]  losartan-hydrochlorothiazide (HYZAAR) 100-12.5 MG tablet Take 1 tablet by mouth daily. 02/20/21   [provider]  OXcarbazepine (TRILEPTAL) 150 MG tablet Take 150-300 mg by mouth See admin instructions. Take 150 mg (1 tablet) by mouth every morning, and 300 mg (2 tablets) at bedtime. 06/12/18   [provider]  polyethylene glycol (MIRALAX / GLYCOLAX) 17 g packet Take 17 g by mouth daily. 10/25/21   [provider]  rivastigmine (EXELON) 1.5 MG capsule Take 1.5 mg by mouth 2 (two) times daily. 03/28/20 01/08/22  [provider]    Physical Exam: Vitals:   02/10/22 1430 02/10/22 1500 02/10/22 1514 02/10/22 1531  BP: (!) 129/42 (!) 121/39    Pulse: (!) 57 (!) 58 (!) 58   Resp: _0 Temp:      TempSrc:      SpO2:  95% 100% 94%   Physical Exam Vitals reviewed.  Constitutional:      Comments: On BiPAP.  Sleeping but arouses easily.  Appears comfortable on the BiPAP.  HENT:     Head: Normocephalic and atraumatic.  Eyes:     Pupils: Pupils are equal, round, and reactive to light.  Cardiovascular:     Rate and Rhythm: Bradycardia present.  Pulmonary:     Effort: Pulmonary effort is normal.     Breath sounds: Examination of the right-upper field reveals wheezing. Examination of the left-upper field reveals wheezing. Examination of the right-middle field reveals wheezing. Examination of the left-middle field reveals wheezing. Examination of the right-lower field reveals wheezing. Examination of the left-lower field reveals wheezing. Wheezing present.  Abdominal:     General: Bowel sounds are normal.     Palpations: Abdomen is soft.  Musculoskeletal:        General: Normal range of motion.     Cervical back: Normal range of motion and neck supple.  Skin:    General: Skin is warm and dry.  Neurological:     Comments: Unable to assess  Psychiatric:     Comments:  Unable to assess     Data Reviewed:  Relevant notes from primary care and specialist visits, past discharge summaries as available in EHR, including Care Everywhere. Prior diagnostic testing as pertinent to current admission diagnoses Updated medications and problem lists for reconciliation ED course, including vitals, labs, imaging, treatment and response to treatment Triage notes, nursing and pharmacy notes and ED provider's notes Notable results as noted in HPI Labs reviewed.  White count 6.0, hemoglobin 11.9, hematocrit 36, platelet count 230, sodium 135, potassium 3.2, chloride 91, bicarb 30, glucose 141, BUN 30, creatinine 2.0 compared to baseline of 1.23, calcium 8.9, total protein 7.4, albumin 4.1, AST 21, ALT 13, alk phos 66 VBG  7.3 4/66/31/35.6 Chest x-ray reviewed by me shows no evidence of acute cardiopulmonary disease CT scan of the head without contrast shows no acute intracranial abnormalities. Chronic microvascular disease and brain atrophy. Left frontal scalp contusion with small hematoma measuring 5 mm in thickness. Twelve-lead EKG reviewed by me shows sinus rhythm with prolonged PR interval. There are no new results to review at this time.  Assessment and Plan: * COPD with acute exacerbation ([place]) Patient with a known history of COPD who presents to the ER for evaluation of worsening shortness of breath from her baseline with increased work of breathing requiring BiPAP. Place patient on systemic and inhaled steroids Continue bronchodilator therapy   Acute respiratory failure with hypoxia (HCC) Secondary to acute COPD exacerbation Patient presented for evaluation of worsening shortness of breath from her baseline and had increased work of breathing requiring BiPAP We will attempt to wean off BiPAP once acute illness improves or resolves  Dementia with behavioral disturbance ([place]) Continue Exelon and as needed Haldol  Falls frequently Patient has had 2 falls in the  last 24 hours and has had prior ER visits for evaluation following falls Patient noted to have a left frontal scalp contusion with small hematoma measuring 5 mm in thickness from her recent fall Place patient on fall precautions  Hypokalemia Secondary to diuretic therapy Supplement potassium  Check magnesium levels   AKI (acute kidney injury) ([place]) Patient has a baseline serum creatinine of 1.23 and today on admission it is 2.0 AKI appears to be prerenal and related to diuretic use Hold hydrochlorothiazide and losartan for now [facility] patient and repeat renal parameters in a.m.  HTN (hypertension) Continue amlodipine Hold bisoprolol due to bradycardia with prolonged PR interval Hold HCTZ and losartan due to AKI  Depression with anxiety Continue bupropion and Klonopin  Panlobular emphysema (HCC) Patient with known history of COPD Treatment as outlined in 1      Advance Care Planning:   Code Status: DNR   Consults: Will need physical therapy once acute illness improves  Family Communication: Greater than 50% of time spent discussing patient's condition and plan of care with her sister, Ms Tawni Pummel over the phone.  All questions and concerns have been addressed.  She verbalizes understanding and agrees with the plan.  CODE STATUS was discussed and she is a DO NOT RESUSCITATE.  Severity of Illness: The appropriate patient status for this patient is INPATIENT. Inpatient status is judged to be reasonable and necessary in order to provide the required intensity of service to ensure the patient's safety. The patient's presenting symptoms, physical exam findings, and initial radiographic and laboratory data in the context of their chronic comorbidities is felt to place them at high risk for further clinical deterioration. Furthermore, it is not anticipated that the patient will be medically stable for discharge from the hospital within 2 midnights of admission.   * I certify that at the  point of admission it is my clinical judgment that the patient will require inpatient hospital care spanning beyond 2 midnights from the point of admission due to high intensity of service, high risk for further deterioration and high frequency of surveillance required.*  Author: Collier Bullock, MD 02/10/2022 3:41 PM  For on call review [email or web address].

## 2022-02-10 NOTE — ED Notes (Signed)
Coarse crackles noted bilaterally.

## 2022-02-10 NOTE — ED Notes (Signed)
Pt taken to CT on 15 liter's non-rebreather at this time.

## 2022-02-10 NOTE — ED Provider Notes (Signed)
[facility] Provider Note    Event Date/Time   First MD Initiated Contact with Patient 02/10/22 1219     (approximate)   History   Shortness of Breath  Level V cAveat:  AMS  HPI  Samantha Carpenter is a 81 y.o. female history of COPD emphysema as well as advanced dementia DNR presents to the ER for evaluation of 2 falls in the past 24 hours as well as shortness of breath.  Patient significant wheezing on exam tachypneic on arrival.  No motor hypoxia.  Patient unable to provide much additional history.     Physical Exam   Triage Vital Signs: ED Triage Vitals  Enc Vitals Group     BP      Pulse      Resp      Temp      Temp src      SpO2      Weight      Height      Head Circumference      Peak Flow      Pain Score      Pain Loc      Pain Edu?      Excl. in [place]?     Most recent vital signs: Vitals:   02/10/22 1430 02/10/22 1500  BP: (!) 129/42 (!) 121/39  Pulse: (!) 57 (!) 58  Resp: 16 19  Temp:    SpO2:  95%     Constitutional: Alert, tachypnea Eyes: Conjunctivae are normal.  Head: contusion and ecchymosis to left forehead Nose: No congestion/rhinnorhea. Mouth/Throat: Mucous membranes are moist.   Neck: Painless ROM.  Cardiovascular:   Good peripheral circulation. Respiratory: tachypnea with use of accessory muscles Gastrointestinal: Soft and nontender.  Musculoskeletal:  no deformity Neurologic:  MAE spontaneously. No gross focal neurologic deficits are appreciated.  Skin:  Skin is warm, dry and intact. No rash noted.     ED Results / Procedures / Treatments   Labs (all labs ordered are listed, but only abnormal results are displayed) Labs Reviewed  BLOOD GAS, VENOUS - Abnormal; Notable for the following components:      Result Value   pCO2, Ven 66 (*)    pO2, Ven 31 (*)    Bicarbonate 35.6 (*)    Acid-Base Excess 7.4 (*)    All other components within normal limits  CBC WITH DIFFERENTIAL/PLATELET - Abnormal; Notable  for the following components:   Hemoglobin 11.9 (*)    All other components within normal limits  COMPREHENSIVE METABOLIC PANEL - Abnormal; Notable for the following components:   Potassium 3.2 (*)    Chloride 91 (*)    Glucose, Bld 141 (*)    BUN 30 (*)    Creatinine, Ser 2.00 (*)    GFR, Estimated 24 (*)    All other components within normal limits  RESP PANEL BY RT-PCR (FLU A&B, COVID) ARPGX2  CK     EKG  ED ECG REPORT I, Merlyn Lot, the attending physician, personally viewed and interpreted this ECG.   Date: 02/10/2022  EKG Time: 14:56  Rate: 60  Rhythm: sinus  Axis: normal  Intervals:normal  ST&T Change: normal intervals, no stemi    RADIOLOGY Please see ED Course for my review and interpretation.  I personally reviewed all radiographic images ordered to evaluate for the above acute complaints and reviewed radiology reports and findings.  These findings were personally discussed with the patient.  Please see medical record for radiology report.  PROCEDURES:  Critical Care performed: Yes, see critical care procedure note(s)  .Critical Care  Performed by: Willy Eddy, MD Authorized by: Willy Eddy, MD   Critical care provider statement:    Critical care time (minutes):  40   Critical care was time spent personally by me on the following activities:  Ordering and performing treatments and interventions, ordering and review of laboratory studies, ordering and review of radiographic studies, pulse oximetry, re-evaluation of patient's condition, review of old charts, obtaining history from patient or surrogate, examination of patient, evaluation of patient's response to treatment, discussions with primary provider, discussions with consultants and development of treatment plan with patient or surrogate    MEDICATIONS ORDERED IN ED: Medications  sodium chloride 0.9 % bolus 500 mL (has no administration in time range)  ipratropium-albuterol  (DUONEB) 0.5-2.5 (3) MG/3ML nebulizer solution 3 mL (3 mLs Nebulization Given 02/10/22 1414)     IMPRESSION / MDM / ASSESSMENT AND PLAN / ED COURSE  I reviewed the triage vital signs and the nursing notes.                              Differential diagnosis includes, but is not limited to, copd. Ptx, cva, sah, sdh, Dehydration, sepsis, pna, uti, hypoglycemia, cva, drug effect, withdrawal,   Patient presenting to the ER for evaluation of symptoms as described above.  Based on symptoms, risk factors and considered above differential, this presenting complaint could reflect a potentially life-threatening illness therefore the patient will be placed on continuous pulse oximetry and telemetry for monitoring.  Laboratory evaluation will be sent to evaluate for the above complaints.  Patient quite tachypneic with diffuse wheezing on exam history of COPD.  Placed on BiPAP   Clinical Course as of 02/10/22 1514  Sat Feb 10, 2022  1238 Chest x-ray on my review and interpretation does not show any evidence of pneumothorax.  Patient appears more comfortable on BiPAP. [PR]  1305 VBG with mild hypercapnia.  No leukocytosis.  Respirations and exam significantly improved on BiPAP. [PR]  1448 Case discussed in consultation with hospitalist. [PR]    Clinical Course User Index [PR] Willy Eddy, MD      FINAL CLINICAL IMPRESSION(S) / ED DIAGNOSES   Final diagnoses:  COPD exacerbation (HCC)  Acute respiratory failure with hypoxia and hypercapnia (HCC)     Rx / DC Orders   ED Discharge Orders     None        Note:  This document was prepared using Dragon voice recognition software and may include unintentional dictation errors.    Willy Eddy, MD 02/10/22 [phone]

## 2022-02-11 DIAGNOSIS — J441 Chronic obstructive pulmonary disease with (acute) exacerbation: Secondary | ICD-10-CM | POA: Diagnosis not present

## 2022-02-11 LAB — BASIC METABOLIC PANEL
Anion gap: 8 (ref 5–15)
BUN: 32 mg/dL — ABNORMAL HIGH (ref 8–23)
CO2: 26 mmol/L (ref 22–32)
Calcium: 8.4 mg/dL — ABNORMAL LOW (ref 8.9–10.3)
Chloride: 103 mmol/L (ref 98–111)
Creatinine, Ser: 1.37 mg/dL — ABNORMAL HIGH (ref 0.44–1.00)
GFR, Estimated: 39 mL/min — ABNORMAL LOW (ref >60)
Glucose, Bld: 178 mg/dL — ABNORMAL HIGH (ref 70–99)
Potassium: 4.4 mmol/L (ref 3.5–5.1)
Sodium: 137 mmol/L (ref 135–145)

## 2022-02-11 LAB — CBC
HCT: 32.3 % — ABNORMAL LOW (ref 36.0–46.0)
Hemoglobin: 10.6 g/dL — ABNORMAL LOW (ref 12.0–15.0)
MCH: 30.7 pg (ref 26.0–34.0)
MCHC: 32.8 g/dL (ref 30.0–36.0)
MCV: 93.6 fL (ref 80.0–100.0)
Platelets: 203 10*3/uL (ref 150–400)
RBC: 3.45 MIL/uL — ABNORMAL LOW (ref 3.87–5.11)
RDW: 13.2 % (ref 11.5–15.5)
WBC: 4.3 10*3/uL (ref 4.0–10.5)
nRBC: 0 % (ref 0.0–0.2)

## 2022-02-11 MED ORDER — HALOPERIDOL 2 MG PO TABS: 2.0000 mg | ORAL_TABLET | Freq: Four times a day (QID) | ORAL | Status: DC | PRN

## 2022-02-11 MED ORDER — ORAL CARE MOUTH RINSE: 15.0000 mL | OROMUCOSAL | Status: DC | PRN

## 2022-02-11 MED ORDER — HALOPERIDOL LACTATE 5 MG/ML IJ SOLN
2.0000 mg | Freq: Once | INTRAMUSCULAR | Status: AC
  Administered 2022-02-11: 2 mg via INTRAVENOUS
  Filled 2022-02-11: qty 1

## 2022-02-11 MED ORDER — HALOPERIDOL LACTATE 5 MG/ML IJ SOLN
2.0000 mg | Freq: Four times a day (QID) | INTRAMUSCULAR | Status: DC | PRN
  Administered 2022-02-11: 2 mg via INTRAVENOUS
  Filled 2022-02-11 (×2): qty 1

## 2022-02-11 MED ORDER — ORAL CARE MOUTH RINSE
15.0000 mL | OROMUCOSAL | Status: DC
  Administered 2022-02-11 – 2022-02-19 (×18): 15 mL via OROMUCOSAL

## 2022-02-11 MED ORDER — LACTATED RINGERS IV BOLUS
250.0000 mL | Freq: Once | INTRAVENOUS | Status: AC
  Administered 2022-02-11: 250 mL via INTRAVENOUS

## 2022-02-11 NOTE — ED Notes (Signed)
IV team at bedside 

## 2022-02-11 NOTE — Progress Notes (Signed)
Pt continues to pull at lines and oxygen tubing, not easily redirectable. NP Foust notified and order for sitter at bedside initiated.   Lamonte Richer, RN

## 2022-02-11 NOTE — ED Notes (Signed)
Pt awake and talking, in no distress. RT paged to trial pt off BiPAP

## 2022-02-11 NOTE — Progress Notes (Signed)
Pt transported to 238 on Bipap without incident. Pt remains on Bipap and is tol well at this time. Report given to RT.

## 2022-02-11 NOTE — ED Notes (Signed)
Pt incontinent of urine. Bed linens changed.

## 2022-02-11 NOTE — Progress Notes (Signed)
Pt awake and trying multiple times to remove BiPAP. RN attempted to redirect without success. PRN IV haldol given. RT came by and removed BiPAP, placed pt on nasal cannula. Will continue current plan of care.   Lamonte Richer, RN

## 2022-02-11 NOTE — Progress Notes (Signed)
PROGRESS NOTE    Samantha Carpenter  HCW:[id number] DOB: 03-12-1941 DOA: 02/10/2022 PCP: Pcp, No   Brief Narrative:  This 81 years old female with PMH significant for COPD, hypertension, stage IIIa CKD, dementia, dyslipidemia presented the ED for the evaluation after she was found with her head down in the dining hall and appeared to be more short of breath when compared to her baseline.  Patient had a fall the night prior to her admission and hit her head.  When EMS arrived patient was found to have audible wheezes and received Solu-Medrol 125 mg IV and 2 back-to-back nebulizer treatment prior to being transported in the ER.  Patient was also found to be having altered mental status as compared to baseline.  She was placed on BiPAP for increased work of breathing.  Patient was admitted for further evaluation.  Assessment & Plan:   Principal Problem:   COPD with acute exacerbation (HCC) Active Problems:   Acute respiratory failure with hypoxia (HCC)   Dementia with behavioral disturbance (HCC)   Panlobular emphysema (HCC)   Depression with anxiety   HTN (hypertension)   AKI (acute kidney injury) (HCC)   Hypokalemia   Falls frequently  Acute Exacerbation of COPD: Patient with known history of COPD presented in the ED for the evaluation of worsening shortness of breath from her baseline. She presented  with increased work of breathing requiring BiPAP. Initiated on IV Solu-Medrol, and then transition to prednisone. Continue scheduled and as needed nebulized bronchodilators.   Acute hypoxic respiratory failure: Likely secondary to acute COPD exacerbation. Continue BiPAP and try to wean as tolerated.  Dementia with behavioral problems: Continue Exelon patch and as needed Haldol.   Recurrent falls: Patient has had 2 falls in last 24 hours and had prior ER visits for evaluation following falls Patient noted to have a left frontal scalp contusion with small hematoma measuring 5 mm in  thickness from her recent fall Continue fall precautions. PT and OT evaluation.   Hypokalemia: Likely secondary to diuretic therapy. Replaced and resolved    AKI on CKD stage IIIb: Baseline serum creatinine 1.3 and on admission presented with 2.0 AKI appears to be prerenal and related to diuretic use. Hold hydrochlorothiazide and losartan for now. Continue IV hydration.  Serum creatinine back to baseline.   Essential hypertension: Continue amlodipine Hold bisoprolol due to bradycardia with prolonged PR interval Hold HCTZ and losartan due to AKI   Depression with anxiety: Continue bupropion and Klonopin   DVT prophylaxis: Lovenox Code Status: DNR Family Communication: (No family at bedside) Disposition Plan:   Status is: Inpatient Remains inpatient appropriate because: Admitted for acute hypoxic respiratory failure secondary to COPD exacerbation requiring BiPAP for increased work of breathing.   Consultants:  None  Procedures: None Antimicrobials:  Anti-infectives (From admission, onward)    None       Subjective: Patient was seen and examined at bedside.  Overnight events noted.   Patient reports doing better.  She reports feeling hungry and wants to eat.    Objective: Vitals:   02/11/22 1130 02/11/22 1306 02/11/22 1308 02/11/22 1352  BP: (!) 143/78     Pulse: 77     Resp: 20     Temp:      TempSrc:      SpO2: 100% 96% 96% 100%  Weight:       No intake or output data in the 24 hours ending 02/11/22 1418 Filed Weights   02/10/22 1617  Weight: 68 kg  Examination:  General exam: Appears comfortable, not in any acute distress.  Very deconditioned Respiratory system: Decreased breath sounds, respiratory effort normal, RR 15 Cardiovascular system: S1 & S2 heard, regular rate and rhythm, no murmur. Gastrointestinal system: Abdomen is soft, non tender, non distended, BS+ Central nervous system: Alert and oriented x 3. No focal neurological  deficits. Extremities: No edema, no cyanosis, no clubbing. Skin: No rashes, lesions or ulcers Psychiatry: Judgement and insight appear normal. Mood & affect appropriate.     Data Reviewed: I have personally reviewed following labs and imaging studies  CBC: Recent Labs  Lab 02/10/22 1225 02/11/22 0250  WBC 6.0 4.3  NEUTROABS 3.6  --   HGB 11.9* 10.6*  HCT 36.0 32.3*  MCV 92.8 93.6  PLT 230 203   Basic Metabolic Panel: Recent Labs  Lab 02/10/22 1225 02/10/22 1835 02/11/22 0400  NA 135  --  137  K 3.2*  --  4.4  CL 91*  --  103  CO2 30  --  26  GLUCOSE 141*  --  178*  BUN 30*  --  32*  CREATININE 2.00*  --  1.37*  CALCIUM 8.9  --  8.4*  MG  --  1.7  --    GFR: Estimated Creatinine Clearance: 30 mL/min (A) (by C-G formula based on SCr of 1.37 mg/dL (H)). Liver Function Tests: Recent Labs  Lab 02/10/22 1225  AST 21  ALT 13  ALKPHOS 66  BILITOT 0.6  PROT 7.4  ALBUMIN 4.1   No results for input(s): "LIPASE", "AMYLASE" in the last 168 hours. No results for input(s): "AMMONIA" in the last 168 hours. Coagulation Profile: No results for input(s): "INR", "PROTIME" in the last 168 hours. Cardiac Enzymes: Recent Labs  Lab 02/10/22 1225  CKTOTAL 69   BNP (last 3 results) No results for input(s): "PROBNP" in the last 8760 hours. HbA1C: No results for input(s): "HGBA1C" in the last 72 hours. CBG: No results for input(s): "GLUCAP" in the last 168 hours. Lipid Profile: No results for input(s): "CHOL", "HDL", "LDLCALC", "TRIG", "CHOLHDL", "LDLDIRECT" in the last 72 hours. Thyroid Function Tests: No results for input(s): "TSH", "T4TOTAL", "FREET4", "T3FREE", "THYROIDAB" in the last 72 hours. Anemia Panel: No results for input(s): "VITAMINB12", "FOLATE", "FERRITIN", "TIBC", "IRON", "RETICCTPCT" in the last 72 hours. Sepsis Labs: No results for input(s): "PROCALCITON", "LATICACIDVEN" in the last 168 hours.  Recent Results (from the past 240 hour(s))  Resp Panel by  RT-PCR (Flu A&B, Covid) Anterior Nasal Swab     Status: None   Collection Time: 02/10/22  2:13 PM   Specimen: Anterior Nasal Swab  Result Value Ref Range Status   SARS Coronavirus 2 by RT PCR NEGATIVE NEGATIVE Final    Comment: (NOTE) SARS-CoV-2 target nucleic acids are NOT DETECTED.  The SARS-CoV-2 RNA is generally detectable in upper respiratory specimens during the acute phase of infection. The lowest concentration of SARS-CoV-2 viral copies this assay can detect is 138 copies/mL. A negative result does not preclude SARS-Cov-2 infection and should not be used as the sole basis for treatment or other patient management decisions. A negative result may occur with  improper specimen collection/handling, submission of specimen other than nasopharyngeal swab, presence of viral mutation(s) within the areas targeted by this assay, and inadequate number of viral copies(<138 copies/mL). A negative result must be combined with clinical observations, patient history, and epidemiological information. The expected result is Negative.  Fact Sheet for Patients:  BloggerCourse.com  Fact Sheet for Healthcare Providers:  SeriousBroker.ithttps://www.fda.gov/media/152162/download  This test is no t yet approved or cleared by the [place] FDA and  has been authorized for detection and/or diagnosis of SARS-CoV-2 by FDA under an Emergency Use Authorization (EUA). This EUA will remain  in effect (meaning this test can be used) for the duration of the COVID-19 declaration under Section 564(b)(1) of the Act, 21 U.S.C.section 360bbb-3(b)(1), unless the authorization is terminated  or revoked sooner.       Influenza A by PCR NEGATIVE NEGATIVE Final   Influenza B by PCR NEGATIVE NEGATIVE Final    Comment: (NOTE) The Xpert Xpress SARS-CoV-2/FLU/RSV plus assay is intended as an aid in the diagnosis of influenza from Nasopharyngeal swab specimens and should not be used as a sole basis for  treatment. Nasal washings and aspirates are unacceptable for Xpert Xpress SARS-CoV-2/FLU/RSV testing.  Fact Sheet for Patients: BloggerCourse.comhttps://www.fda.gov/media/152166/download  Fact Sheet for Healthcare Providers: SeriousBroker.ithttps://www.fda.gov/media/152162/download  This test is not yet approved or cleared by the [place] FDA and has been authorized for detection and/or diagnosis of SARS-CoV-2 by FDA under an Emergency Use Authorization (EUA). This EUA will remain in effect (meaning this test can be used) for the duration of the COVID-19 declaration under Section 564(b)(1) of the Act, 21 U.S.C. section 360bbb-3(b)(1), unless the authorization is terminated or revoked.  Performed at [facility], [address] [place], [place] [id number]     Radiology Studies: CT HEAD WO CONTRAST (5MM)  Result Date: 02/10/2022 CLINICAL DATA:  Status post fall. Patient was found minimally responsive this morning EXAM: CT HEAD WITHOUT CONTRAST TECHNIQUE: Contiguous axial images were obtained from the base of the skull through the vertex without intravenous contrast. RADIATION DOSE REDUCTION: This exam was performed according to the departmental dose-optimization program which includes automated exposure control, adjustment of the mA and/or kV according to patient size and/or use of iterative reconstruction technique. COMPARISON:  01/08/2022 FINDINGS: Brain: No evidence of acute infarction, hemorrhage, hydrocephalus, extra-axial collection or mass lesion/mass effect. There is mild diffuse low-attenuation within the subcortical and periventricular white matter compatible with chronic microvascular disease. Prominence of the sulci and ventricles compatible with brain atrophy. Vascular: No hyperdense vessel or unexpected calcification. Skull: Normal. Negative for fracture or focal lesion. Sinuses/Orbits: Mucosal thickening identified within the maxillary sinuses, there is mild pan sinus mucosal thickening. Mastoid  air cells are clear. Other: Signs of left frontal scalp contusion with small hematoma measuring 5 mm in thickness. IMPRESSION: 1. No acute intracranial abnormalities. 2. Chronic microvascular disease and brain atrophy. 3. Left frontal scalp contusion with small hematoma measuring 5 mm in thickness. 4. Mild sinus inflammation. Electronically Signed   By: Signa Kellaylor  Stroud M.D.   On: 02/10/2022 14:10   DG Chest Portable 1 View  Result Date: 02/10/2022 CLINICAL DATA:  81 year old female found down.  Recent fall. EXAM: PORTABLE CHEST 1 VIEW COMPARISON:  Portable chest 01/08/2022 and earlier. FINDINGS: Portable AP upright view at 1232 hours. Lung volumes and mediastinal contours remain within normal limits. Visualized tracheal air column is within normal limits. Allowing for portable technique the lungs are clear. No pneumothorax or pleural effusion. No acute osseous abnormality identified. Paucity bowel gas in the visible abdomen. IMPRESSION: No acute cardiopulmonary abnormality or acute traumatic injury identified. Electronically Signed   By: Odessa FlemingH  Hall M.D.   On: 02/10/2022 12:46    Scheduled Meds:  amLODipine  10 mg Oral Daily   aspirin EC  81 mg Oral Daily   buPROPion  150 mg Oral Daily  clonazePAM  1 mg Oral BID   DULoxetine  60 mg Oral Daily   enoxaparin (LOVENOX) injection  30 mg Subcutaneous Q24H   gabapentin  100 mg Oral BID   ipratropium-albuterol  3 mL Nebulization Q6H   mometasone-formoterol  2 puff Inhalation BID   OXcarbazepine  150 mg Oral q morning   OXcarbazepine  300 mg Oral QHS   polyethylene glycol  17 g Oral Daily   predniSONE  40 mg Oral Q breakfast   rivastigmine  1.5 mg Oral BID   sodium chloride flush  3 mL Intravenous Q12H   Continuous Infusions:  sodium chloride     0.9 % NaCl with KCl 40 mEq / L 100 mL/hr at 02/11/22 0244     LOS: 1 day    Time spent: 50 mins    Khalaya Mcgurn, MD [facility]   If 7PM-7AM, please contact night-coverage

## 2022-02-11 NOTE — ED Notes (Signed)
Pt removed all cardiac monitoring. Pt removed IV. 2nd IV secured.

## 2022-02-11 NOTE — ED Notes (Signed)
Pt did not void on this PM shift.  Bladder scan showed 239 mL.  NP notified and declined to order in and out cath patient at this time.

## 2022-02-11 NOTE — Progress Notes (Addendum)
       CROSS COVER NOTE  NAME: Samantha Carpenter MRN: [id number] DOB : 07/24/1940 ATTENDING PHYSICIAN: Cipriano Bunker, MD    Date of Service   02/11/2022      Page received from nursing requesting IV or IM medication for patient agitation. Per RN [name] has removed multiple IVs today and is requiring 1:1 care due to agitation. RN reports she will not take ordered PO Haldol at the moment. 2 mg IV Haldol x1 ordered and PRN order updated to include IV route.  6073: Still pulling at lines despite IV Haldol x2. Messaged by RT that Patient is pulling bipap mask off, mask removed and placed on [place] currently. Not in distress. Not in Mitts. 0.5mg  Ativan ordered.  0320: Message received from RN that patient is refusing nebulizer and has removed nasal cannula. Still not in mitts. RN reports patient became combative when she and safety sitter attempted to put patient in mitts. I presented to bedside and placed patients in Mitts without difficulty. She is currently receiving nebulizer and has audible wheezing. Patient to be placed back on bipap. Zyprexa ordered. While on unit I witnessed patient continuing to attempt to remove Bipap mask and laying in bed with her head at the foot of the bed. Case discussed with night attending, if Zyprexa ineffective will transfer to stepdown for Precedex.       This document was prepared using Dragon voice recognition software and may include unintentional dictation errors.  Bishop Limbo DNP, MBA, FNP-BC Nurse Practitioner [facility] Pager [phone]

## 2022-02-11 NOTE — Progress Notes (Signed)
Late entry: While on the floor, noticed patient has taken BIPAP unit off and taken mask apart. BIPAP machine turned off and placed patient back on nasal cannula

## 2022-02-11 NOTE — ED Notes (Signed)
Pt continues to remove monitoring. Attempted to remove second IV. IV redressed and pt redirected.

## 2022-02-11 NOTE — ED Notes (Signed)
Writer went into patient's room upon start of shift at 1910 to find patient's pulling Bipap off and pulling pieces apart. Writer attempted to calming redirect patient. Pt became aggressive and attempted to Chief Strategy Officer. Writer paged Jaci Carrel, NP and obtained 1 time PRN IV haldol order. Upon reentering room with IV kit and haldol, patient was asleep. NT at bedside hooked patient up to monitoring equipment to which patient was cooperative and non combative.

## 2022-02-12 DIAGNOSIS — J441 Chronic obstructive pulmonary disease with (acute) exacerbation: Secondary | ICD-10-CM | POA: Diagnosis not present

## 2022-02-12 LAB — GLUCOSE, CAPILLARY: Glucose-Capillary: 92 mg/dL (ref 70–99)

## 2022-02-12 LAB — PHOSPHORUS: Phosphorus: 1.7 mg/dL — ABNORMAL LOW (ref 2.5–4.6)

## 2022-02-12 LAB — CBC
HCT: 30.3 % — ABNORMAL LOW (ref 36.0–46.0)
Hemoglobin: 9.7 g/dL — ABNORMAL LOW (ref 12.0–15.0)
MCH: 30.4 pg (ref 26.0–34.0)
MCHC: 32 g/dL (ref 30.0–36.0)
MCV: 95 fL (ref 80.0–100.0)
Platelets: 245 10*3/uL (ref 150–400)
RBC: 3.19 MIL/uL — ABNORMAL LOW (ref 3.87–5.11)
RDW: 13.6 % (ref 11.5–15.5)
WBC: 9.3 10*3/uL (ref 4.0–10.5)
nRBC: 0 % (ref 0.0–0.2)

## 2022-02-12 LAB — BASIC METABOLIC PANEL
Anion gap: 8 (ref 5–15)
BUN: 24 mg/dL — ABNORMAL HIGH (ref 8–23)
CO2: 26 mmol/L (ref 22–32)
Calcium: 8.7 mg/dL — ABNORMAL LOW (ref 8.9–10.3)
Chloride: 105 mmol/L (ref 98–111)
Creatinine, Ser: 0.81 mg/dL (ref 0.44–1.00)
GFR, Estimated: >60 mL/min (ref >60)
Glucose, Bld: 117 mg/dL — ABNORMAL HIGH (ref 70–99)
Potassium: 3.9 mmol/L (ref 3.5–5.1)
Sodium: 139 mmol/L (ref 135–145)

## 2022-02-12 LAB — MAGNESIUM: Magnesium: 1.7 mg/dL (ref 1.7–2.4)

## 2022-02-12 MED ORDER — ENOXAPARIN SODIUM 40 MG/0.4ML IJ SOSY
40.0000 mg | PREFILLED_SYRINGE | INTRAMUSCULAR | Status: DC
  Administered 2022-02-12 – 2022-02-18 (×6): 40 mg via SUBCUTANEOUS
  Filled 2022-02-12 (×6): qty 0.4

## 2022-02-12 MED ORDER — SODIUM PHOSPHATES 45 MMOLE/15ML IV SOLN
30.0000 mmol | Freq: Once | INTRAVENOUS | Status: AC
  Administered 2022-02-12: 30 mmol via INTRAVENOUS
  Filled 2022-02-12: qty 10

## 2022-02-12 MED ORDER — LORAZEPAM 2 MG/ML IJ SOLN
0.5000 mg | Freq: Once | INTRAMUSCULAR | Status: AC
  Administered 2022-02-12: 0.5 mg via INTRAVENOUS
  Filled 2022-02-12: qty 1

## 2022-02-12 MED ORDER — IPRATROPIUM-ALBUTEROL 0.5-2.5 (3) MG/3ML IN SOLN
3.0000 mL | Freq: Two times a day (BID) | RESPIRATORY_TRACT | Status: DC
  Administered 2022-02-13 – 2022-02-16 (×7): 3 mL via RESPIRATORY_TRACT
  Filled 2022-02-12 (×3): qty 3
  Filled 2022-02-12: qty 39
  Filled 2022-02-12 (×3): qty 3

## 2022-02-12 MED ORDER — OLANZAPINE 10 MG IM SOLR
5.0000 mg | Freq: Once | INTRAMUSCULAR | Status: AC | PRN
  Administered 2022-02-12: 5 mg via INTRAMUSCULAR
  Filled 2022-02-12: qty 10

## 2022-02-12 NOTE — Progress Notes (Signed)
PROGRESS NOTE    Samantha Carpenter  [id number] DOB: 01/19/1941 DOA: 02/10/2022 PCP: Pcp, No   Brief Narrative:  This 81 years old female with PMH significant for COPD, hypertension, stage IIIa CKD, dementia, dyslipidemia presented the ED for the evaluation after she was found with her head down in the dining hall and appeared to be more short of breath when compared to her baseline.  Patient had a fall the night prior to her admission and hit her head.  When EMS arrived patient was found to have audible wheezes and received Solu-Medrol 125 mg IV and 2 back-to-back nebulizer treatment prior to being transported in the ER.  Patient was also found to be having altered mental status as compared to baseline.  She was placed on BiPAP for increased work of breathing.  Patient was admitted for further evaluation.  Assessment & Plan:   Principal Problem:   COPD with acute exacerbation ([place]) Active Problems:   Acute respiratory failure with hypoxia (HCC)   Dementia with behavioral disturbance (HCC)   Panlobular emphysema (HCC)   Depression with anxiety   HTN (hypertension)   AKI (acute kidney injury) ([place])   Hypokalemia   Falls frequently  Acute hypoxic respiratory failure Acute Exacerbation of COPD: Patient with known history of COPD presented in the ED for the evaluation of worsening shortness of breath from her baseline. She presented  with increased work of breathing requiring BiPAP. Initiated on IV Solu-Medrol, and then transitioned to prednisone. Continue scheduled and as needed nebulized bronchodilators. She continued to remain on BiPAP throughout the night. Pulmonology consulted awaiting recommendation.   Acute hypoxic respiratory failure: Likely secondary to acute COPD exacerbation. Continue BiPAP and try to wean as tolerated. She is weaned down to 4 L of supplemental oxygen.  Saturating 98%.  Dementia with behavioral problems: Continue Exelon patch and as needed Haldol. She  was agitated and restless requiring Haldol last night.   Recurrent falls: Patient has had 2 falls in last 24 hours and had prior ER visits for evaluation following falls Patient noted to have a left frontal scalp contusion with small hematoma measuring 5 mm in thickness from her recent fall Continue fall precautions. PT and OT evaluation.   Hypokalemia: Likely secondary to diuretic therapy. Replaced and resolved    AKI on CKD stage IIIb: Baseline serum creatinine 1.3 and on admission presented with 2.0 AKI appears to be prerenal and related to diuretic use. Hold hydrochlorothiazide and losartan for now. Continue IV hydration.  Serum creatinine back to baseline.   Essential hypertension: Continue amlodipine Hold bisoprolol due to bradycardia with prolonged PR interval Hold HCTZ and losartan due to AKI   Depression with anxiety: Continue bupropion and Klonopin   DVT prophylaxis: Lovenox Code Status: DNR Family Communication: (No family at bedside) Disposition Plan:   Status is: Inpatient Remains inpatient appropriate because: Admitted for acute hypoxic respiratory failure secondary to COPD exacerbation requiring BiPAP for increased work of breathing. Pulmonology is consulted.   Consultants:  None  Procedures: None Antimicrobials:  Anti-infectives (From admission, onward)    None       Subjective: Patient was seen and examined at bedside.  Overnight events noted.   Patient was agitated and restless last night,  trying to pull out the BiPAP.   Patient was given Haldol to calm down. She is now alert and weaned down to 4 L of supplemental oxygen.  Objective: Vitals:   02/11/22 2329 02/12/22 0445 02/12/22 1020 02/12/22 1322  BP: (!) 178/75 Marland Kitchen)  158/74 (!) 140/43   Pulse: (!) 112 88 74   Resp: (!) 24 (!) 24 20   Temp: 98.7 F (37.1 C) 98.5 F (36.9 C)    TempSrc: Axillary Axillary    SpO2: 90% 100% 100% 98%  Weight:      Height:        Intake/Output Summary  (Last 24 hours) at 02/12/2022 1405 Last data filed at 02/12/2022 1100 Gross per 24 hour  Intake 2739.47 ml  Output --  Net 2739.47 ml   Filed Weights   02/10/22 1617 02/11/22 2029  Weight: 68 kg 68 kg    Examination:  General exam: Appears comfortable, not in any acute distress, deconditioned. Respiratory system: Decreased breath sounds, respiratory for normal, RR 13. Cardiovascular system: S1 & S2 heard, regular rate and rhythm, no murmur. Gastrointestinal system: Abdomen is soft, nontender, nondistended, BS+ Central nervous system: Alert and oriented x 3. No focal neurological deficits. Extremities: No edema, no cyanosis, no clubbing. Skin: No rashes, lesions or ulcers Psychiatry: Judgement and insight appear normal. Mood & affect appropriate.     Data Reviewed: I have personally reviewed following labs and imaging studies  CBC: Recent Labs  Lab 02/10/22 1225 02/11/22 0250 02/12/22 0420  WBC 6.0 4.3 9.3  NEUTROABS 3.6  --   --   HGB 11.9* 10.6* 9.7*  HCT 36.0 32.3* 30.3*  MCV 92.8 93.6 95.0  PLT 230 203 245   Basic Metabolic Panel: Recent Labs  Lab 02/10/22 1225 02/10/22 1835 02/11/22 0400 02/12/22 0420  NA 135  --  137 139  K 3.2*  --  4.4 3.9  CL 91*  --  103 105  CO2 30  --  26 26  GLUCOSE 141*  --  178* 117*  BUN 30*  --  32* 24*  CREATININE 2.00*  --  1.37* 0.81  CALCIUM 8.9  --  8.4* 8.7*  MG  --  1.7  --  1.7  PHOS  --   --   --  1.7*   GFR: Estimated Creatinine Clearance: 48.4 mL/min (by C-G formula based on SCr of 0.81 mg/dL). Liver Function Tests: Recent Labs  Lab 02/10/22 1225  AST 21  ALT 13  ALKPHOS 66  BILITOT 0.6  PROT 7.4  ALBUMIN 4.1   No results for input(s): "LIPASE", "AMYLASE" in the last 168 hours. No results for input(s): "AMMONIA" in the last 168 hours. Coagulation Profile: No results for input(s): "INR", "PROTIME" in the last 168 hours. Cardiac Enzymes: Recent Labs  Lab 02/10/22 1225  CKTOTAL 69   BNP (last 3  results) No results for input(s): "PROBNP" in the last 8760 hours. HbA1C: No results for input(s): "HGBA1C" in the last 72 hours. CBG: Recent Labs  Lab 02/12/22 1332  GLUCAP 92   Lipid Profile: No results for input(s): "CHOL", "HDL", "LDLCALC", "TRIG", "CHOLHDL", "LDLDIRECT" in the last 72 hours. Thyroid Function Tests: No results for input(s): "TSH", "T4TOTAL", "FREET4", "T3FREE", "THYROIDAB" in the last 72 hours. Anemia Panel: No results for input(s): "VITAMINB12", "FOLATE", "FERRITIN", "TIBC", "IRON", "RETICCTPCT" in the last 72 hours. Sepsis Labs: No results for input(s): "PROCALCITON", "LATICACIDVEN" in the last 168 hours.  Recent Results (from the past 240 hour(s))  Resp Panel by RT-PCR (Flu A&B, Covid) Anterior Nasal Swab     Status: None   Collection Time: 02/10/22  2:13 PM   Specimen: Anterior Nasal Swab  Result Value Ref Range Status   SARS Coronavirus 2 by RT PCR NEGATIVE NEGATIVE Final  Comment: (NOTE) SARS-CoV-2 target nucleic acids are NOT DETECTED.  The SARS-CoV-2 RNA is generally detectable in upper respiratory specimens during the acute phase of infection. The lowest concentration of SARS-CoV-2 viral copies this assay can detect is 138 copies/mL. A negative result does not preclude SARS-Cov-2 infection and should not be used as the sole basis for treatment or other patient management decisions. A negative result may occur with  improper specimen collection/handling, submission of specimen other than nasopharyngeal swab, presence of viral mutation(s) within the areas targeted by this assay, and inadequate number of viral copies(<138 copies/mL). A negative result must be combined with clinical observations, patient history, and epidemiological information. The expected result is Negative.  Fact Sheet for Patients:  EntrepreneurPulse.com.au  Fact Sheet for Healthcare Providers:  IncredibleEmployment.be  This test is no  t yet approved or cleared by the [place] FDA and  has been authorized for detection and/or diagnosis of SARS-CoV-2 by FDA under an Emergency Use Authorization (EUA). This EUA will remain  in effect (meaning this test can be used) for the duration of the COVID-19 declaration under Section 564(b)(1) of the Act, 21 U.S.C.section 360bbb-3(b)(1), unless the authorization is terminated  or revoked sooner.       Influenza A by PCR NEGATIVE NEGATIVE Final   Influenza B by PCR NEGATIVE NEGATIVE Final    Comment: (NOTE) The Xpert Xpress SARS-CoV-2/FLU/RSV plus assay is intended as an aid in the diagnosis of influenza from Nasopharyngeal swab specimens and should not be used as a sole basis for treatment. Nasal washings and aspirates are unacceptable for Xpert Xpress SARS-CoV-2/FLU/RSV testing.  Fact Sheet for Patients: EntrepreneurPulse.com.au  Fact Sheet for Healthcare Providers: IncredibleEmployment.be  This test is not yet approved or cleared by the [place] FDA and has been authorized for detection and/or diagnosis of SARS-CoV-2 by FDA under an Emergency Use Authorization (EUA). This EUA will remain in effect (meaning this test can be used) for the duration of the COVID-19 declaration under Section 564(b)(1) of the Act, 21 U.S.C. section 360bbb-3(b)(1), unless the authorization is terminated or revoked.  Performed at [facility], [address] [place], [place] [address]     Radiology Studies: No results found.  Scheduled Meds:  amLODipine  10 mg Oral Daily   aspirin EC  81 mg Oral Daily   buPROPion  150 mg Oral Daily   clonazePAM  1 mg Oral BID   DULoxetine  60 mg Oral Daily   enoxaparin (LOVENOX) injection  40 mg Subcutaneous Q24H   gabapentin  100 mg Oral BID   ipratropium-albuterol  3 mL Nebulization Q6H   mometasone-formoterol  2 puff Inhalation BID   mouth rinse  15 mL Mouth Rinse 4 times per day    OXcarbazepine  150 mg Oral q morning   OXcarbazepine  300 mg Oral QHS   polyethylene glycol  17 g Oral Daily   predniSONE  40 mg Oral Q breakfast   rivastigmine  1.5 mg Oral BID   sodium chloride flush  3 mL Intravenous Q12H   Continuous Infusions:  sodium chloride     sodium phosphate 30 mmol in dextrose 5 % 250 mL infusion 30 mmol (02/12/22 1020)     LOS: 2 days    Time spent: 35 mins    Ezella Kell, MD [facility]   If 7PM-7AM, please contact night-coverage

## 2022-02-12 NOTE — Progress Notes (Signed)
Patient triggering BIPAP alarm and setting off bed alarm. This RN entered room to assess patient. Patient clearly asking to use the bathroom and BIPAP removed and placed on 4L [place]. After voiding on BSC returned to bed and patient helped to wash her face and find something to watch on TV. Primary MD notified of patients cooperation and being awake and more alert. Clear liquid diet ordered and soda provided for patient. Patient removed [place] and saturations remain above 90%. Bed alarm on and patient comfortable in bed. Will continue frequent rounding.

## 2022-02-12 NOTE — TOC Initial Note (Signed)
Transition of Care [facility]) - Initial/Assessment Note    Patient Details  Name: Samantha Carpenter MRN: [id number] Date of Birth: Oct 22, 1940  Transition of Care [facility]) CM/SW Contact:    Candie Chroman, LCSW Phone Number: 02/12/2022, 3:23 PM  Clinical Narrative:  Patient is a resident of [facility] on their memory care side. She is active with [facility] for PT, OT, and RN.                 Expected Discharge Plan: Memory Care (with home health) Barriers to Discharge: Continued Medical Work up   Patient Goals and CMS Choice        Expected Discharge Plan and Services Expected Discharge Plan: Memory Care (with home health)       Living arrangements for the past 2 months: [place]                           HH Arranged: RN, PT, OT [facility] Agency: [place] Date [place]: 02/12/22   Representative spoke with at [place]: Bobbe Medico  Prior Living Arrangements/Services Living arrangements for the past 2 months: [place] Lives with:: Facility Resident Patient language and need for interpreter reviewed:: Yes        Need for Family Participation in Patient Care: Yes (Comment) Care giver support system in place?: Yes (comment) Current home services: Home OT, Home PT, Home RN Criminal Activity/Legal Involvement Pertinent to Current Situation/Hospitalization: No - Comment as needed  Activities of Daily Living   ADL Screening (condition at time of admission) Patient's cognitive ability adequate to safely complete daily activities?: Yes Is the patient deaf or have difficulty hearing?: No Does the patient have difficulty seeing, even when wearing glasses/contacts?: No Does the patient have difficulty concentrating, remembering, or making decisions?: Yes Patient able to express need for assistance with ADLs?: Yes Does the patient have difficulty dressing or bathing?: No Independently performs ADLs?: Yes  (appropriate for developmental age) Does the patient have difficulty walking or climbing stairs?: Yes Weakness of Legs: None Weakness of Arms/Hands: None  Permission Sought/Granted                  Emotional Assessment   Attitude/Demeanor/Rapport: Unable to Assess Affect (typically observed): Unable to Assess Orientation: : Oriented to Self Alcohol / Substance Use: Not Applicable Psych Involvement: No (comment)  Admission diagnosis:  COPD exacerbation ([place]) [J44.1] COPD with acute exacerbation ([place]) [J44.1] Acute respiratory failure with hypoxia and hypercapnia ([place]) [J96.01, J96.02] Patient Active Problem List   Diagnosis Date Noted   COPD with acute exacerbation ([place]) 02/10/2022   Hypokalemia 02/10/2022   Falls frequently 02/10/2022   Hypoglycemia 01/11/2022   AKI (acute kidney injury) ([place]) 01/10/2022   Pressure injury of skin 01/09/2022   COPD exacerbation ([place]) 01/08/2022   Prediabetes 01/08/2022   Dementia with behavioral disturbance ([place]) 11/15/2021   Hypoxia 11/15/2021   COVID-19 virus infection 11/14/2021   Right rib fracture 11/09/2020   Fall at home, initial encounter 11/09/2020   COPD (chronic obstructive pulmonary disease) ([place]) 11/09/2020   Depression with anxiety 11/09/2020   Elevated troponin 11/09/2020   Acute respiratory failure with hypoxia ([place]) 11/09/2020   HTN (hypertension) 11/09/2020   Abrasions of multiple sites    Pure hypercholesterolemia 06/09/2018   Recurrent major depressive disorder, in partial remission ([place]) 04/28/2018   Panlobular emphysema ([place]) 04/28/2018   HTN, goal below 140/80 04/28/2018  CKD (chronic kidney disease) stage 3, GFR 30-59 ml/min (HCC) 04/28/2018   PCP:  Pcp, No Pharmacy:   [id number] - [place], [place] - [address] AT [facility] [address] [place] [address] [place] [address] Phone: [phone] Fax: [phone]  [id number] - [place], [place] - [address] [place] [address] Phone: [phone] Fax: [phone]     Social Determinants of Health (SDOH) Interventions    Readmission Risk Interventions    01/11/2022    2:05 PM 01/09/2022    2:23 PM  Readmission Risk Prevention Plan  Transportation Screening  Complete  HRI or Home Care Consult Complete   Palliative Care Screening  Not Applicable  Medication Review (RN Care Manager)  Complete

## 2022-02-12 NOTE — Progress Notes (Signed)
Patient saturations maintaining on 4L nasal cannula at 98% however patient is less awake than when initially taken off BIPAP. Patient following commands and when asked if she would like the BIPAP back on she answered yes. BIPAP back on and patient still noted to have bilateral wheezing and rhonchi. Continuous IV fluids discontinued per primary MD. CBG checked since patient has been mostly NPO except a few sips of water to take medications. CBG=92.

## 2022-02-12 NOTE — Consult Note (Signed)
PHARMACIST - PHYSICIAN COMMUNICATION  CONCERNING:  Enoxaparin (Lovenox) for DVT Prophylaxis    RECOMMENDATION: Patient was prescribed enoxaprin 30mg  q24 hours for VTE prophylaxis.   Filed Weights   02/10/22 1617 02/11/22 2029  Weight: 68 kg (150 lb) 68 kg (150 lb)    Body mass index is 27.44 kg/m.  Estimated Creatinine Clearance: 48.4 mL/min (by C-G formula based on SCr of 0.81 mg/dL).  Patient is no longer candidate for enoxaparin 30mg  every 24 hours based on CrCl >73ml/min and Weight >45kg  DESCRIPTION: Pharmacy has adjusted enoxaparin dose per [facility] policy.  Patient is now receiving enoxaparin 40 mg every 24 hours   31m, PharmD PGY1 Pharmacy Resident 02/12/2022 6:49 AM

## 2022-02-13 DIAGNOSIS — J441 Chronic obstructive pulmonary disease with (acute) exacerbation: Secondary | ICD-10-CM | POA: Diagnosis not present

## 2022-02-13 MED ORDER — SODIUM PHOSPHATES 45 MMOLE/15ML IV SOLN: 30.0000 mmol | Freq: Once | INTRAVENOUS | Status: DC

## 2022-02-13 MED ORDER — LOSARTAN POTASSIUM-HCTZ 100-12.5 MG PO TABS: 1.0000 | ORAL_TABLET | Freq: Every day | ORAL | Status: DC

## 2022-02-13 MED ORDER — LOSARTAN POTASSIUM 50 MG PO TABS
100.0000 mg | ORAL_TABLET | Freq: Every day | ORAL | Status: DC
  Administered 2022-02-13 – 2022-02-19 (×7): 100 mg via ORAL
  Filled 2022-02-13 (×7): qty 2

## 2022-02-13 MED ORDER — HYDROCHLOROTHIAZIDE 12.5 MG PO TABS
12.5000 mg | ORAL_TABLET | Freq: Every day | ORAL | Status: DC
  Administered 2022-02-13 – 2022-02-14 (×2): 12.5 mg via ORAL
  Filled 2022-02-13 (×2): qty 1

## 2022-02-13 NOTE — Progress Notes (Signed)
Patient refused BIPAP. Stable on [place]. NARD/SOB noted at this time.

## 2022-02-13 NOTE — Care Management Important Message (Signed)
Important Message  Patient Details  Name: Samantha Carpenter MRN: [id number] Date of Birth: 1940/06/23   Medicare Important Message Given:  N/A - LOS <3 / Initial given by admissions     Johnell Comings 02/13/2022, 8:53 AM

## 2022-02-13 NOTE — Consult Note (Signed)
PULMONOLOGY         Date: 02/13/2022,   MRN# [id number] Samantha Carpenter 1940/04/29     AdmissionWeight: 68 kg                 CurrentWeight: 68 kg  Referring provider: Dr Lucianne Muss   CHIEF COMPLAINT:   Acute on chronic hypoxemic respiratory failure   HISTORY OF PRESENT ILLNESS   This is a pleasant 81 year old female with a history of chronic kidney disease, dementia, depression, essential hypertension, liver disease, COPD with chronic hypoxemia who came in for onset of shortness of breath after sustaining mechanical fall.  She comes from residence in a dining hall and seem to be with acute onset of respiratory distress and reported shortness of breath.  She did not lose consciousness but reported hitting her head on the ground, CT head in the emergency room was negative for acute intracranial changes however did show a scalp contusion and a small hematoma approximately 5 mm in thickness, additional findings include mild sinusitis..  She did have audible wheezing on arrival and did receive IV Solu-Medrol as well as placed on BiPAP noninvasive ventilator for respiratory support.  Blood work did reveal what appears to be acute on chronic renal insufficiency with a creatinine increased to 2.0.  She received resuscitative fluids in the ER and was admitted in the hospital for further evaluation.  She remains dyspneic with hypoxemia and pulmonary consultation was placed for ongoing shortness of breath with advanced centrilobular emphysema.  She had a chest x-ray done which shows normal lung contours without pneumothorax, pulmonary edema or infiltrate.  Pulmonary consultation placed for additional evaluation and management   PAST MEDICAL HISTORY   Past Medical History:  Diagnosis Date   Chronic kidney disease    COPD (chronic obstructive pulmonary disease) (HCC)    Dementia (HCC)    Depression    Hypertension    Liver disease      SURGICAL HISTORY   Past Surgical History:   Procedure Laterality Date   ABDOMINAL HYSTERECTOMY     CHOLECYSTECTOMY     ESOPHAGOGASTRODUODENOSCOPY N/A 11/20/2019   Procedure: ESOPHAGOGASTRODUODENOSCOPY (EGD);  Surgeon: Regis Bill, MD;  Location: [facility] ENDOSCOPY;  Service: Gastroenterology;  Laterality: N/A;   face tuck       FAMILY HISTORY   Family History  Problem Relation Age of Onset   Bipolar disorder Brother    Schizophrenia Brother    Bipolar disorder Sister    Schizophrenia Sister    Drug abuse Son      SOCIAL HISTORY   Social History   Tobacco Use   Smoking status: Former    Types: Cigarettes    Quit date: 09/01/2009    Years since quitting: 12.4   Smokeless tobacco: Never  Vaping Use   Vaping Use: Never used  Substance Use Topics   Alcohol use: Not Currently   Drug use: Not Currently     MEDICATIONS    Home Medication:    Current Medication:  Current Facility-Administered Medications:    0.9 %  sodium chloride infusion, 250 mL, Intravenous, PRN, Agbata, Tochukwu, MD   acetaminophen (TYLENOL) tablet 650 mg, 650 mg, Oral, Q6H PRN **OR** acetaminophen (TYLENOL) suppository 650 mg, 650 mg, Rectal, Q6H PRN, Agbata, Tochukwu, MD   amLODipine (NORVASC) tablet 10 mg, 10 mg, Oral, Daily, Agbata, Tochukwu, MD, 10 mg at 02/13/22 1019   aspirin EC tablet 81 mg, 81 mg, Oral, Daily, Agbata, Tochukwu, MD, 81 mg at 02/13/22  1020   buPROPion (WELLBUTRIN XL) 24 hr tablet 150 mg, 150 mg, Oral, Daily, Agbata, Tochukwu, MD, 150 mg at 02/13/22 1020   clonazePAM (KLONOPIN) tablet 1 mg, 1 mg, Oral, BID, Agbata, Tochukwu, MD, 1 mg at 02/13/22 1020   DULoxetine (CYMBALTA) DR capsule 60 mg, 60 mg, Oral, Daily, Agbata, Tochukwu, MD, 60 mg at 02/13/22 1020   enoxaparin (LOVENOX) injection 40 mg, 40 mg, Subcutaneous, Q24H, Coulter, Carolyn, RPH, 40 mg at 02/12/22 2118   gabapentin (NEURONTIN) capsule 100 mg, 100 mg, Oral, BID, Agbata, Tochukwu, MD, 100 mg at 02/13/22 1019   haloperidol (HALDOL) tablet 2 mg, 2 mg, Oral,  Q6H PRN **OR** haloperidol lactate (HALDOL) injection 2 mg, 2 mg, Intravenous, Q6H PRN, Foust, Katy L, NP, 2 mg at 02/11/22 2136   ipratropium-albuterol (DUONEB) 0.5-2.5 (3) MG/3ML nebulizer solution 3 mL, 3 mL, Nebulization, BID, Agbata, Tochukwu, MD, 3 mL at 02/13/22 0742   mometasone-formoterol (DULERA) 200-5 MCG/ACT inhaler 2 puff, 2 puff, Inhalation, BID, Agbata, Tochukwu, MD, 2 puff at 02/13/22 1025   ondansetron (ZOFRAN) tablet 4 mg, 4 mg, Oral, Q6H PRN **OR** ondansetron (ZOFRAN) injection 4 mg, 4 mg, Intravenous, Q6H PRN, Agbata, Tochukwu, MD, 4 mg at 02/12/22 0037   Oral care mouth rinse, 15 mL, Mouth Rinse, 4 times per day, Agbata, Tochukwu, MD, 15 mL at 02/13/22 1025   Oral care mouth rinse, 15 mL, Mouth Rinse, PRN, Agbata, Tochukwu, MD   OXcarbazepine (TRILEPTAL) tablet 150 mg, 150 mg, Oral, q morning, Foye Deer, RPH, 150 mg at 02/12/22 1351   Oxcarbazepine (TRILEPTAL) tablet 300 mg, 300 mg, Oral, QHS, Foye Deer, RPH, 300 mg at 02/12/22 2118   polyethylene glycol (MIRALAX / GLYCOLAX) packet 17 g, 17 g, Oral, Daily, Agbata, Tochukwu, MD   [COMPLETED] methylPREDNISolone sodium succinate (SOLU-MEDROL) 40 mg/mL injection 40 mg, 40 mg, Intravenous, Q12H, 40 mg at 02/11/22 0245 **FOLLOWED BY** predniSONE (DELTASONE) tablet 40 mg, 40 mg, Oral, Q breakfast, Agbata, Tochukwu, MD, 40 mg at 02/13/22 1019   rivastigmine (EXELON) capsule 1.5 mg, 1.5 mg, Oral, BID, Agbata, Tochukwu, MD, 1.5 mg at 02/12/22 2118   sodium chloride flush (NS) 0.9 % injection 3 mL, 3 mL, Intravenous, Q12H, Agbata, Tochukwu, MD, 3 mL at 02/13/22 1022   sodium chloride flush (NS) 0.9 % injection 3 mL, 3 mL, Intravenous, PRN, Agbata, Tochukwu, MD    ALLERGIES   Morphine and related and Latex     REVIEW OF SYSTEMS    Review of Systems:  Gen:  Denies  fever, sweats, chills weigh loss  HEENT: Denies blurred vision, double vision, ear pain, eye pain, hearing loss, nose bleeds, sore throat Cardiac:  No  dizziness, chest pain or heaviness, chest tightness,edema Resp:   reports dyspnea chronically  Gi: Denies swallowing difficulty, stomach pain, nausea or vomiting, diarrhea, constipation, bowel incontinence Gu:  Denies bladder incontinence, burning urine Ext:   Denies Joint pain, stiffness or swelling Skin: Denies  skin rash, easy bruising or bleeding or hives Endoc:  Denies polyuria, polydipsia , polyphagia or weight change Psych:   Denies depression, insomnia or hallucinations   Other:  All other systems negative   VS: BP (!) 147/50 (BP Location: Left Arm)   Pulse 83   Temp 98.2 F (36.8 C)   Resp 20   Ht 5\' 2"  (1.575 m)   Wt 68 kg   SpO2 100%   BMI 27.44 kg/m      PHYSICAL EXAM    GENERAL:NAD, no fevers, chills, no  weakness no fatigue HEAD: Normocephalic, atraumatic.  EYES: Pupils equal, round, reactive to light. Extraocular muscles intact. No scleral icterus.  MOUTH: Moist mucosal membrane. Dentition intact. No abscess noted.  EAR, NOSE, THROAT: Clear without exudates. No external lesions.  NECK: Supple. No thyromegaly. No nodules. No JVD.  PULMONARY: decreased breath sounds with mild rhonchi worse at bases bilaterally.  CARDIOVASCULAR: S1 and S2. Regular rate and rhythm. No murmurs, rubs, or gallops. No edema. Pedal pulses 2+ bilaterally.  GASTROINTESTINAL: Soft, nontender, nondistended. No masses. Positive bowel sounds. No hepatosplenomegaly.  MUSCULOSKELETAL: No swelling, clubbing, or edema. Range of motion full in all extremities.  NEUROLOGIC: Cranial nerves II through XII are intact. No gross focal neurological deficits. Sensation intact. Reflexes intact.  SKIN: No ulceration, lesions, rashes, or cyanosis. Skin warm and dry. Turgor intact.  PSYCHIATRIC: Mood, affect within normal limits. The patient is awake, alert and oriented x 3. Insight, judgment intact.       IMAGING     ASSESSMENT/PLAN   Acute on chronic hypoxemic respiratory failure Patient is  negative for COVID-19, negative for flu a and B Respiratory viral panel performed 01/08/2022 is negative as well -Patient seems to have moderate acute exacerbation of COPD will treat with COPD care path as well as repeat respiratory viral panel in order inflammatory biomarkers with CRP to determine if any acute changes are ongoing. -Repeat BMP with improved renal function and resolution of previously noted AKI -CBC without leukocytosis and mild decrement in hemoglobin likely due to dilutional effects from resuscitative fluids -Agree with DuoNebs 4 times daily as needed, agree with tapering dosing of prednisone, currently on 40 mg will reduce to 30 mg p.o. daily -Adding Zithromax 250 p.o. 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   Patient/Family are satisfied with care plan and all questions have been answered.    Provider disclosure: Patient with at least one acute or chronic illness or injury that poses a threat to life or bodily function and is being managed actively during this encounter.  All of the below services have been performed independently by signing provider:  review of prior documentation from internal and or external health records.  Review of previous and current lab results.  Interview and comprehensive assessment during patient visit today. Review of current and previous chest radiographs/CT scans. Discussion of management and  test interpretation with health care team and patient/family.   This document was prepared using Dragon voice recognition software and may include unintentional dictation errors.     Vida Rigger, M.D.  Division of Pulmonary & Critical Care Medicine

## 2022-02-13 NOTE — Progress Notes (Signed)
PROGRESS NOTE    Samantha Carpenter  ZOX:[id number] DOB: 12-Nov-1940 DOA: 02/10/2022 PCP: Pcp, No   Brief Narrative:  This 81 years old female with PMH significant for COPD, hypertension, stage IIIa CKD, dementia, dyslipidemia presented the ED for the evaluation after she was found with her head down in the dining hall and appeared to be more short of breath when compared to her baseline.  Patient had a fall the night prior to her admission and hit her head.  When EMS arrived patient was found to have audible wheezes and received Solu-Medrol 125 mg IV and 2 back-to-back nebulizer treatment prior to being transported in the ER.  Patient was also found to be having altered mental status as compared to baseline.  She was placed on BiPAP for increased work of breathing.  Patient was admitted for further evaluation.  Assessment & Plan:   Principal Problem:   COPD with acute exacerbation (HCC) Active Problems:   Acute respiratory failure with hypoxia (HCC)   Dementia with behavioral disturbance (HCC)   Panlobular emphysema (HCC)   Depression with anxiety   HTN (hypertension)   AKI (acute kidney injury) (HCC)   Hypokalemia   Falls frequently  Acute hypoxic respiratory failure: Acute Exacerbation of COPD: Patient with known history of COPD presented in the ED for the evaluation of worsening shortness of breath from her baseline. She presented  with increased work of breathing requiring BiPAP. Initiated on IV Solu-Medrol, and then transitioned to prednisone. Continue scheduled and as needed nebulized bronchodilators. She continued to remain on BiPAP throughout the night. Pulmonology consulted,  awaiting recommendation. She is weaned down to 4L/ min, sating 90%   Acute hypoxic respiratory failure: Likely secondary to acute COPD exacerbation. Continue BiPAP and try to wean as tolerated. She is weaned down to 4 L of supplemental oxygen.  Saturating 90%.  Dementia with behavioral  problems: Continue Exelon patch and as needed Haldol. She was agitated and restless requiring Haldol last night.  Recurrent falls: Patient has had 2 falls in last 24 hours and had prior ER visits for evaluation following falls Patient noted to have a left frontal scalp contusion with small hematoma measuring 5 mm in thickness from her recent fall Continue fall precautions. PT and OT evaluation.   Hypokalemia: Likely secondary to diuretic therapy. Replaced and resolved.    AKI on CKD stage IIIb: > Resolved. Baseline serum creatinine 1.3 and on admission presented with 2.0 AKI appears to be prerenal and related to diuretic use. Hold hydrochlorothiazide and losartan for now. Continue IV hydration.  Serum creatinine back to baseline.   Essential hypertension: Continue amlodipine. Hold bisoprolol due to bradycardia with prolonged PR interval Resumed HCTZ and losartan as AKI resolved.   Depression with anxiety: Continue bupropion and Klonopin   DVT prophylaxis: Lovenox Code Status: DNR Family Communication: (No family at bedside) Disposition Plan:   Status is: Inpatient Remains inpatient appropriate because: Admitted for acute hypoxic respiratory failure secondary to COPD exacerbation requiring BiPAP for increased work of breathing. Pulmonology is consulted.   Consultants:  Pulmonology  Procedures: None Antimicrobials:  Anti-infectives (From admission, onward)    None       Subjective: Patient was seen and examined at bedside.  Overnight events noted.   Patient is lying comfortably remains on 4 L of supplemental oxygen sats 90%. She reports feeling hungry asking to eat.  Objective: Vitals:   02/13/22 0020 02/13/22 0325 02/13/22 0743 02/13/22 1221  BP:  (!) 133/55  (!) 147/50  Pulse:  65  83  Resp: (!) 26 17  20   Temp:  98.2 F (36.8 C)  98.2 F (36.8 C)  TempSrc:      SpO2:  100% 98% 100%  Weight:      Height:       No intake or output data in the 24 hours  ending 02/13/22 1423  Filed Weights   02/10/22 1617 02/11/22 2029  Weight: 68 kg 68 kg    Examination:  General exam: Appears comfortable, not in any acute distress, deconditioned. Respiratory system: Decreased breath sound, respiratory effort normal, RR 12. Cardiovascular system: S1-S2 heard, regular rate and rhythm, no murmur. Gastrointestinal system: Abdomen is soft, nontender, nondistended, BS+ Central nervous system: Alert and oriented x 3, no focal neurological deficits. Extremities: No edema, no cyanosis, no clubbing. Skin: No rashes, lesions or ulcers Psychiatry: Judgement and insight appear normal. Mood & affect appropriate.     Data Reviewed: I have personally reviewed following labs and imaging studies  CBC: Recent Labs  Lab 02/10/22 1225 02/11/22 0250 02/12/22 0420  WBC 6.0 4.3 9.3  NEUTROABS 3.6  --   --   HGB 11.9* 10.6* 9.7*  HCT 36.0 32.3* 30.3*  MCV 92.8 93.6 95.0  PLT 230 203 245   Basic Metabolic Panel: Recent Labs  Lab 02/10/22 1225 02/10/22 1835 02/11/22 0400 02/12/22 0420  NA 135  --  137 139  K 3.2*  --  4.4 3.9  CL 91*  --  103 105  CO2 30  --  26 26  GLUCOSE 141*  --  178* 117*  BUN 30*  --  32* 24*  CREATININE 2.00*  --  1.37* 0.81  CALCIUM 8.9  --  8.4* 8.7*  MG  --  1.7  --  1.7  PHOS  --   --   --  1.7*   GFR: Estimated Creatinine Clearance: 48.4 mL/min (by C-G formula based on SCr of 0.81 mg/dL). Liver Function Tests: Recent Labs  Lab 02/10/22 1225  AST 21  ALT 13  ALKPHOS 66  BILITOT 0.6  PROT 7.4  ALBUMIN 4.1   No results for input(s): "LIPASE", "AMYLASE" in the last 168 hours. No results for input(s): "AMMONIA" in the last 168 hours. Coagulation Profile: No results for input(s): "INR", "PROTIME" in the last 168 hours. Cardiac Enzymes: Recent Labs  Lab 02/10/22 1225  CKTOTAL 69   BNP (last 3 results) No results for input(s): "PROBNP" in the last 8760 hours. HbA1C: No results for input(s): "HGBA1C" in the  last 72 hours. CBG: Recent Labs  Lab 02/12/22 1332  GLUCAP 92   Lipid Profile: No results for input(s): "CHOL", "HDL", "LDLCALC", "TRIG", "CHOLHDL", "LDLDIRECT" in the last 72 hours. Thyroid Function Tests: No results for input(s): "TSH", "T4TOTAL", "FREET4", "T3FREE", "THYROIDAB" in the last 72 hours. Anemia Panel: No results for input(s): "VITAMINB12", "FOLATE", "FERRITIN", "TIBC", "IRON", "RETICCTPCT" in the last 72 hours. Sepsis Labs: No results for input(s): "PROCALCITON", "LATICACIDVEN" in the last 168 hours.  Recent Results (from the past 240 hour(s))  Resp Panel by RT-PCR (Flu A&B, Covid) Anterior Nasal Swab     Status: None   Collection Time: 02/10/22  2:13 PM   Specimen: Anterior Nasal Swab  Result Value Ref Range Status   SARS Coronavirus 2 by RT PCR NEGATIVE NEGATIVE Final    Comment: (NOTE) SARS-CoV-2 target nucleic acids are NOT DETECTED.  The SARS-CoV-2 RNA is generally detectable in upper respiratory specimens during the acute phase of infection.  The lowest concentration of SARS-CoV-2 viral copies this assay can detect is 138 copies/mL. A negative result does not preclude SARS-Cov-2 infection and should not be used as the sole basis for treatment or other patient management decisions. A negative result may occur with  improper specimen collection/handling, submission of specimen other than nasopharyngeal swab, presence of viral mutation(s) within the areas targeted by this assay, and inadequate number of viral copies(<138 copies/mL). A negative result must be combined with clinical observations, patient history, and epidemiological information. The expected result is Negative.  Fact Sheet for Patients:  BloggerCourse.com  Fact Sheet for Healthcare Providers:  SeriousBroker.it  This test is no t yet approved or cleared by the [place] FDA and  has been authorized for detection and/or diagnosis of  SARS-CoV-2 by FDA under an Emergency Use Authorization (EUA). This EUA will remain  in effect (meaning this test can be used) for the duration of the COVID-19 declaration under Section 564(b)(1) of the Act, 21 U.S.C.section 360bbb-3(b)(1), unless the authorization is terminated  or revoked sooner.       Influenza A by PCR NEGATIVE NEGATIVE Final   Influenza B by PCR NEGATIVE NEGATIVE Final    Comment: (NOTE) The Xpert Xpress SARS-CoV-2/FLU/RSV plus assay is intended as an aid in the diagnosis of influenza from Nasopharyngeal swab specimens and should not be used as a sole basis for treatment. Nasal washings and aspirates are unacceptable for Xpert Xpress SARS-CoV-2/FLU/RSV testing.  Fact Sheet for Patients: BloggerCourse.com  Fact Sheet for Healthcare Providers: SeriousBroker.it  This test is not yet approved or cleared by the [place] FDA and has been authorized for detection and/or diagnosis of SARS-CoV-2 by FDA under an Emergency Use Authorization (EUA). This EUA will remain in effect (meaning this test can be used) for the duration of the COVID-19 declaration under Section 564(b)(1) of the Act, 21 U.S.C. section 360bbb-3(b)(1), unless the authorization is terminated or revoked.  Performed at [facility], [address], [place], [place] [address]     Radiology Studies: No results found.  Scheduled Meds:  amLODipine  10 mg Oral Daily   aspirin EC  81 mg Oral Daily   buPROPion  150 mg Oral Daily   clonazePAM  1 mg Oral BID   DULoxetine  60 mg Oral Daily   enoxaparin (LOVENOX) injection  40 mg Subcutaneous Q24H   gabapentin  100 mg Oral BID   ipratropium-albuterol  3 mL Nebulization BID   losartan-hydrochlorothiazide  1 tablet Oral Daily   mometasone-formoterol  2 puff Inhalation BID   mouth rinse  15 mL Mouth Rinse 4 times per day   OXcarbazepine  150 mg Oral q morning   OXcarbazepine  300 mg Oral  QHS   polyethylene glycol  17 g Oral Daily   predniSONE  40 mg Oral Q breakfast   rivastigmine  1.5 mg Oral BID   sodium chloride flush  3 mL Intravenous Q12H   Continuous Infusions:  sodium chloride       LOS: 3 days    Time spent: 35 mins    Tarvaris Puglia, MD [facility]   If 7PM-7AM, please contact night-coverage

## 2022-02-14 DIAGNOSIS — J441 Chronic obstructive pulmonary disease with (acute) exacerbation: Secondary | ICD-10-CM | POA: Diagnosis not present

## 2022-02-14 LAB — BASIC METABOLIC PANEL
Anion gap: 10 (ref 5–15)
BUN: 12 mg/dL (ref 8–23)
CO2: 31 mmol/L (ref 22–32)
Calcium: 9.3 mg/dL (ref 8.9–10.3)
Chloride: 99 mmol/L (ref 98–111)
Creatinine, Ser: 0.85 mg/dL (ref 0.44–1.00)
GFR, Estimated: >60 mL/min (ref >60)
Glucose, Bld: 105 mg/dL — ABNORMAL HIGH (ref 70–99)
Potassium: 3.8 mmol/L (ref 3.5–5.1)
Sodium: 140 mmol/L (ref 135–145)

## 2022-02-14 LAB — CBC
HCT: 37.3 % (ref 36.0–46.0)
Hemoglobin: 12.1 g/dL (ref 12.0–15.0)
MCH: 30.7 pg (ref 26.0–34.0)
MCHC: 32.4 g/dL (ref 30.0–36.0)
MCV: 94.7 fL (ref 80.0–100.0)
Platelets: 315 10*3/uL (ref 150–400)
RBC: 3.94 MIL/uL (ref 3.87–5.11)
RDW: 13.3 % (ref 11.5–15.5)
WBC: 10.5 10*3/uL (ref 4.0–10.5)
nRBC: 0 % (ref 0.0–0.2)

## 2022-02-14 LAB — RESPIRATORY PANEL BY PCR

## 2022-02-14 LAB — C-REACTIVE PROTEIN: CRP: 4.4 mg/dL — ABNORMAL HIGH (ref <1.0)

## 2022-02-14 LAB — PHOSPHORUS: Phosphorus: 2.6 mg/dL (ref 2.5–4.6)

## 2022-02-14 LAB — MAGNESIUM: Magnesium: 1.6 mg/dL — ABNORMAL LOW (ref 1.7–2.4)

## 2022-02-14 MED ORDER — MAGNESIUM SULFATE 2 GM/50ML IV SOLN: 2.0000 g | Freq: Once | INTRAVENOUS | Status: DC

## 2022-02-14 MED ORDER — MAGNESIUM OXIDE -MG SUPPLEMENT 400 (240 MG) MG PO TABS
400.0000 mg | ORAL_TABLET | Freq: Two times a day (BID) | ORAL | Status: DC
  Administered 2022-02-15 – 2022-02-19 (×10): 400 mg via ORAL
  Filled 2022-02-14 (×10): qty 1

## 2022-02-14 MED ORDER — MAGNESIUM SULFATE 2 GM/50ML IV SOLN
2.0000 g | Freq: Once | INTRAVENOUS | Status: DC
  Filled 2022-02-14: qty 50

## 2022-02-14 MED ORDER — PREDNISONE 20 MG PO TABS
30.0000 mg | ORAL_TABLET | Freq: Every day | ORAL | Status: AC
  Administered 2022-02-14: 30 mg via ORAL
  Filled 2022-02-14: qty 1

## 2022-02-14 MED ORDER — AZITHROMYCIN 250 MG PO TABS
250.0000 mg | ORAL_TABLET | Freq: Every day | ORAL | Status: DC
  Administered 2022-02-14 – 2022-02-19 (×6): 250 mg via ORAL
  Filled 2022-02-14 (×4): qty 1

## 2022-02-14 NOTE — Plan of Care (Signed)
  Problem: Education: Goal: Knowledge of the prescribed therapeutic regimen will improve Outcome: Progressing   Problem: Activity: Goal: Ability to tolerate increased activity will improve Outcome: Progressing   Problem: Activity: Goal: Will verbalize the importance of balancing activity with adequate rest periods Outcome: Progressing

## 2022-02-14 NOTE — Progress Notes (Signed)
Pt fall at 1911, see further notes to follow

## 2022-02-14 NOTE — Progress Notes (Signed)
VAST consult received to obtain IV access for magnesium. Approached patient and introduced myself. She stated, "you are not going to stick me today".Explained to patient that IV is important for her to receive her magnesium and that her nurse and doctor want her to have IV placed. Patient stated, "You are not sticking me today, Tomorrow is another day". She did allow me to look with ultrasound and draw on her arm. Unit RN and MD notified.

## 2022-02-14 NOTE — Progress Notes (Signed)
PULMONOLOGY         Date: 02/14/2022,   MRN# [id number] Samantha Carpenter 04/20/1940     AdmissionWeight: 68 kg                 CurrentWeight: 68 kg  Referring provider: Dr Lucianne Muss   CHIEF COMPLAINT:   Acute on chronic hypoxemic respiratory failure   HISTORY OF PRESENT ILLNESS   This is a pleasant 81 year old female with a history of chronic kidney disease, dementia, depression, essential hypertension, liver disease, COPD with chronic hypoxemia who came in for onset of shortness of breath after sustaining mechanical fall.  She comes from residence in a dining hall and seem to be with acute onset of respiratory distress and reported shortness of breath.  She did not lose consciousness but reported hitting her head on the ground, CT head in the emergency room was negative for acute intracranial changes however did show a scalp contusion and a small hematoma approximately 5 mm in thickness, additional findings include mild sinusitis..  She did have audible wheezing on arrival and did receive IV Solu-Medrol as well as placed on BiPAP noninvasive ventilator for respiratory support.  Blood work did reveal what appears to be acute on chronic renal insufficiency with a creatinine increased to 2.0.  She received resuscitative fluids in the ER and was admitted in the hospital for further evaluation.  She remains dyspneic with hypoxemia and pulmonary consultation was placed for ongoing shortness of breath with advanced centrilobular emphysema.  She had a chest x-ray done which shows normal lung contours without pneumothorax, pulmonary edema or infiltrate.  Pulmonary consultation placed for additional evaluation and management   02/14/22- patient is stable on room air, she is not having labored breathing and is able to speak in full sentences.  She seems to have dementia on examination of mood and neurologic examination.  She also has difficulty with hearing. From pulmonary perspective she is  cleared for dc home.  I would recommend palliative care evaluation.   PAST MEDICAL HISTORY   Past Medical History:  Diagnosis Date   Chronic kidney disease    COPD (chronic obstructive pulmonary disease) (HCC)    Dementia (HCC)    Depression    Hypertension    Liver disease      SURGICAL HISTORY   Past Surgical History:  Procedure Laterality Date   ABDOMINAL HYSTERECTOMY     CHOLECYSTECTOMY     ESOPHAGOGASTRODUODENOSCOPY N/A 11/20/2019   Procedure: ESOPHAGOGASTRODUODENOSCOPY (EGD);  Surgeon: Regis Bill, MD;  Location: [facility] ENDOSCOPY;  Service: Gastroenterology;  Laterality: N/A;   face tuck       FAMILY HISTORY   Family History  Problem Relation Age of Onset   Bipolar disorder Brother    Schizophrenia Brother    Bipolar disorder Sister    Schizophrenia Sister    Drug abuse Son      SOCIAL HISTORY   Social History   Tobacco Use   Smoking status: Former    Types: Cigarettes    Quit date: 09/01/2009    Years since quitting: 12.4   Smokeless tobacco: Never  Vaping Use   Vaping Use: Never used  Substance Use Topics   Alcohol use: Not Currently   Drug use: Not Currently     MEDICATIONS    Home Medication:    Current Medication:  Current Facility-Administered Medications:    0.9 %  sodium chloride infusion, 250 mL, Intravenous, PRN, Agbata, Tochukwu, MD   acetaminophen (TYLENOL)  tablet 650 mg, 650 mg, Oral, Q6H PRN **OR** acetaminophen (TYLENOL) suppository 650 mg, 650 mg, Rectal, Q6H PRN, Agbata, Tochukwu, MD   amLODipine (NORVASC) tablet 10 mg, 10 mg, Oral, Daily, Agbata, Tochukwu, MD, 10 mg at 02/13/22 1019   aspirin EC tablet 81 mg, 81 mg, Oral, Daily, Agbata, Tochukwu, MD, 81 mg at 02/13/22 1020   azithromycin (ZITHROMAX) tablet 250 mg, 250 mg, Oral, Daily, Karna Christmas, Madoc Holquin, MD   buPROPion (WELLBUTRIN XL) 24 hr tablet 150 mg, 150 mg, Oral, Daily, Agbata, Tochukwu, MD, 150 mg at 02/13/22 1020   clonazePAM (KLONOPIN) tablet 1 mg, 1 mg, Oral,  BID, Agbata, Tochukwu, MD, 1 mg at 02/13/22 2057   DULoxetine (CYMBALTA) DR capsule 60 mg, 60 mg, Oral, Daily, Agbata, Tochukwu, MD, 60 mg at 02/13/22 1020   enoxaparin (LOVENOX) injection 40 mg, 40 mg, Subcutaneous, Q24H, Coulter, Carolyn, RPH, 40 mg at 02/13/22 2059   gabapentin (NEURONTIN) capsule 100 mg, 100 mg, Oral, BID, Agbata, Tochukwu, MD, 100 mg at 02/13/22 2058   haloperidol (HALDOL) tablet 2 mg, 2 mg, Oral, Q6H PRN **OR** haloperidol lactate (HALDOL) injection 2 mg, 2 mg, Intravenous, Q6H PRN, Foust, Katy L, NP, 2 mg at 02/11/22 2136   losartan (COZAAR) tablet 100 mg, 100 mg, Oral, Daily, 100 mg at 02/13/22 1641 **AND** hydrochlorothiazide (HYDRODIURIL) tablet 12.5 mg, 12.5 mg, Oral, Daily, Barrie Folk, RPH, 12.5 mg at 02/13/22 1642   ipratropium-albuterol (DUONEB) 0.5-2.5 (3) MG/3ML nebulizer solution 3 mL, 3 mL, Nebulization, BID, Agbata, Tochukwu, MD, 3 mL at 02/14/22 0736   mometasone-formoterol (DULERA) 200-5 MCG/ACT inhaler 2 puff, 2 puff, Inhalation, BID, Agbata, Tochukwu, MD, 2 puff at 02/13/22 2003   ondansetron (ZOFRAN) tablet 4 mg, 4 mg, Oral, Q6H PRN **OR** ondansetron (ZOFRAN) injection 4 mg, 4 mg, Intravenous, Q6H PRN, Agbata, Tochukwu, MD, 4 mg at 02/12/22 0037   Oral care mouth rinse, 15 mL, Mouth Rinse, 4 times per day, Agbata, Tochukwu, MD, 15 mL at 02/13/22 2247   Oral care mouth rinse, 15 mL, Mouth Rinse, PRN, Agbata, Tochukwu, MD   OXcarbazepine (TRILEPTAL) tablet 150 mg, 150 mg, Oral, q morning, Foye Deer, RPH, 150 mg at 02/13/22 1200   Oxcarbazepine (TRILEPTAL) tablet 300 mg, 300 mg, Oral, QHS, Foye Deer, RPH, 300 mg at 02/13/22 2058   polyethylene glycol (MIRALAX / GLYCOLAX) packet 17 g, 17 g, Oral, Daily, Agbata, Tochukwu, MD   [COMPLETED] methylPREDNISolone sodium succinate (SOLU-MEDROL) 40 mg/mL injection 40 mg, 40 mg, Intravenous, Q12H, 40 mg at 02/11/22 0245 **FOLLOWED BY** predniSONE (DELTASONE) tablet 30 mg, 30 mg, Oral, Q breakfast,  Karna Christmas, Tawania Daponte, MD   rivastigmine (EXELON) capsule 1.5 mg, 1.5 mg, Oral, BID, Agbata, Tochukwu, MD, 1.5 mg at 02/13/22 2058   sodium chloride flush (NS) 0.9 % injection 3 mL, 3 mL, Intravenous, Q12H, Agbata, Tochukwu, MD, 3 mL at 02/13/22 2247   sodium chloride flush (NS) 0.9 % injection 3 mL, 3 mL, Intravenous, PRN, Agbata, Tochukwu, MD    ALLERGIES   Morphine and related and Latex     REVIEW OF SYSTEMS    Review of Systems:  Gen:  Denies  fever, sweats, chills weigh loss  HEENT: Denies blurred vision, double vision, ear pain, eye pain, hearing loss, nose bleeds, sore throat Cardiac:  No dizziness, chest pain or heaviness, chest tightness,edema Resp:   reports dyspnea chronically  Gi: Denies swallowing difficulty, stomach pain, nausea or vomiting, diarrhea, constipation, bowel incontinence Gu:  Denies bladder incontinence, burning urine Ext:   Denies  Joint pain, stiffness or swelling Skin: Denies  skin rash, easy bruising or bleeding or hives Endoc:  Denies polyuria, polydipsia , polyphagia or weight change Psych:   Denies depression, insomnia or hallucinations   Other:  All other systems negative   VS: BP (!) 193/78 (BP Location: Left Arm)   Pulse 91   Temp 97.9 F (36.6 C) (Oral)   Resp 18   Ht 5\' 2"  (1.575 m)   Wt 68 kg   SpO2 98%   BMI 27.44 kg/m      PHYSICAL EXAM    GENERAL:NAD, no fevers, chills, no weakness no fatigue HEAD: Normocephalic, atraumatic.  EYES: Pupils equal, round, reactive to light. Extraocular muscles intact. No scleral icterus.  MOUTH: Moist mucosal membrane. Dentition intact. No abscess noted.  EAR, NOSE, THROAT: Clear without exudates. No external lesions.  NECK: Supple. No thyromegaly. No nodules. No JVD.  PULMONARY: decreased breath sounds with mild rhonchi worse at bases bilaterally.  CARDIOVASCULAR: S1 and S2. Regular rate and rhythm. No murmurs, rubs, or gallops. No edema. Pedal pulses 2+ bilaterally.  GASTROINTESTINAL: Soft,  nontender, nondistended. No masses. Positive bowel sounds. No hepatosplenomegaly.  MUSCULOSKELETAL: No swelling, clubbing, or edema. Range of motion full in all extremities.  NEUROLOGIC: Cranial nerves II through XII are intact. No gross focal neurological deficits. Sensation intact. Reflexes intact.  SKIN: No ulceration, lesions, rashes, or cyanosis. Skin warm and dry. Turgor intact.  PSYCHIATRIC: Mood, affect within normal limits. The patient is awake, alert and oriented x 3. Insight, judgment intact.       IMAGING     ASSESSMENT/PLAN   Acute on chronic hypoxemic respiratory failure Patient is negative for COVID-19, negative for flu a and B Respiratory viral panel performed 01/08/2022 is negative as well -Patient seems to have moderate acute exacerbation of COPD will treat with COPD care path as well as repeat respiratory viral panel in order inflammatory biomarkers with CRP to determine if any acute changes are ongoing. -Repeat BMP with improved renal function and resolution of previously noted AKI -CBC without leukocytosis and mild decrement in hemoglobin likely due to dilutional effects from resuscitative fluids -Agree with DuoNebs 4 times daily as needed, agree with tapering dosing of prednisone, currently on 40 mg will reduce to 30 mg p.o. daily -Adding Zithromax 250 p.o. 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    Patient/Family are satisfied with care plan and all questions have been answered.    Provider disclosure: Patient with at least one acute or chronic illness or injury that poses a threat to life or bodily function and is being managed actively during this encounter.  All of the below services have been performed independently by signing provider:  review of prior documentation from internal and or external health records.  Review of previous and current lab results.  Interview and comprehensive assessment during patient visit today. Review of current and previous chest radiographs/CT scans. Discussion of management and test interpretation with health care team and patient/family.   This document was prepared using 01/10/2022 and  may include unintentional dictation errors.     Ottie Glazier, M.D.  Division of Pulmonary & Critical Care Medicine

## 2022-02-14 NOTE — Progress Notes (Signed)
Mobility Specialist - Progress Note   02/14/22 1158  Mobility  Activity Ambulated with assistance in room;Transferred to/from [facility];Transferred from bed to chair  Level of Assistance Minimal assist, patient does 75% or more  Assistive Device None;Front wheel walker  Distance Ambulated (ft) 15 ft  Activity Response Tolerated well  $Mobility charge 1 Mobility     Pre-mobility: 95 HR, 85-90% SpO2 Post-mobility: 94 HR, 93% SpO2   Pt lying in bed upon arrival, utilizing 4L but with [place] doffed on face. O2 sats 85% upon entry and [place] donned improving sats to low 90s. Pt agreeable to session with min encouragement. Aox2. Pt reports throbbing in chest, RN notified and entered to assess prior to continuation of activity. Pt able to complete bed mobility with supervision. A little impulsive but follows commands fairly well. VC for hand placement. STS and ambulation to [facility] with minG and AD. Pt began session utilizing RW, but pushes RW to the side after BSC transfer and ambulates to chair with minA and no AD. Wheezing with sats in high 90s post-session. Dry cough. Pt left in chair with alarm set, needs in reach. RN notified.    Filiberto Pinks Mobility Specialist 02/14/22, 12:05 PM

## 2022-02-14 NOTE — Progress Notes (Signed)
PROGRESS NOTE    Samantha Carpenter  [id number] DOB: 1940/08/09 DOA: 02/10/2022 PCP: Pcp, No   Brief Narrative:  This 81 years old female with PMH significant for COPD, hypertension, stage IIIa CKD, dementia, dyslipidemia presented the ED for the evaluation after she was found with her head down in the dining hall and appeared to be more short of breath when compared to her baseline.  Patient had a fall the night prior to her admission and hit her head.  When EMS arrived patient was found to have audible wheezes and received Solu-Medrol 125 mg IV and 2 back-to-back nebulizer treatment prior to being transported in the ER.  Patient was also found to be having altered mental status as compared to baseline.  She was placed on BiPAP for increased work of breathing.  Patient was admitted for further evaluation.  Assessment & Plan:   Principal Problem:   COPD with acute exacerbation ([place]) Active Problems:   Acute respiratory failure with hypoxia (HCC)   Dementia with behavioral disturbance (HCC)   Panlobular emphysema (HCC)   Depression with anxiety   HTN (hypertension)   AKI (acute kidney injury) ([place])   Hypokalemia   Falls frequently  Acute hypoxic respiratory failure: Acute Exacerbation of COPD: Patient with known history of COPD presented in the ED for the evaluation of worsening shortness of breath from her baseline. She presented  with increased work of breathing requiring BiPAP. Initiated on IV Solu-Medrol, and then transitioned to prednisone. Continue scheduled and as needed nebulized bronchodilators. She continued to remain on BiPAP throughout the night. Pulmonology consulted, agree with current treatment. Continue Zithromax for COPD exacerbation She is weaned down to 4L/ min, sating 90%   Acute hypoxic respiratory failure: Likely secondary to acute COPD exacerbation. Continue BiPAP and try to wean as tolerated. She is weaned down to 4 L of supplemental oxygen.  Saturating  90%.  Dementia with behavioral problems: Continue Exelon patch and as needed Haldol. She was agitated and restless requiring Haldol once.  Recurrent falls: Patient has had 2 falls in last 24 hours and had prior ER visits for evaluation following falls Patient noted to have a left frontal scalp contusion with small hematoma measuring 5 mm in thickness from her recent fall Continue fall precautions. PT and OT evaluation.   Hypokalemia: Likely secondary to diuretic therapy. Replaced and resolved.    AKI on CKD stage IIIb: > Resolved. Baseline serum creatinine 1.3 and on admission presented with 2.0 AKI appears to be prerenal and related to diuretic use. Continue IV hydration.  Serum creatinine back to baseline.   Essential hypertension: Continue amlodipine. Hold bisoprolol due to bradycardia with prolonged PR interval Resumed HCTZ and losartan as AKI resolved.   Depression with anxiety: Continue bupropion and Klonopin   DVT prophylaxis: Lovenox Code Status: DNR Family Communication: (No family at bedside) Disposition Plan:   Status is: Inpatient Remains inpatient appropriate because: Admitted for acute hypoxic respiratory failure secondary to COPD exacerbation requiring BiPAP for increased work of breathing. Pulmonology is consulted.  Agree with above treatment.  Patient is improving.  Anticipated discharge 02/15/2022.   Consultants:  Pulmonology  Procedures: None Antimicrobials:  Anti-infectives (From admission, onward)    Start     Dose/Rate Route Frequency Ordered Stop   02/14/22 0830  azithromycin (ZITHROMAX) tablet 250 mg        250 mg Oral Daily 02/14/22 0734         Subjective: Patient was seen and examined at bedside.  Overnight  events noted.   Patient was lying comfortably in bed, remains on 4 L of supplemental oxygen sats 92%. She is much calmer, comfortable,  does not appear agitated or restless.  Objective: Vitals:   02/14/22 0736 02/14/22 0847  02/14/22 1216 02/14/22 1220  BP:  (!) 141/57 136/76   Pulse:  91 (!) 40 93  Resp:  18    Temp:  98.7 F (37.1 C) 98 F (36.7 C)   TempSrc:  Oral Oral   SpO2: 98% 91% (!) 79% 100%  Weight:      Height:       No intake or output data in the 24 hours ending 02/14/22 1343  Filed Weights   02/10/22 1617 02/11/22 2029  Weight: 68 kg 68 kg    Examination:  General exam: Appears comfortable, not in any acute distress, deconditioned. Respiratory system: Decreased breath sound, respiratory effort normal, RR 12. Cardiovascular system: S1-S2 heard, regular rate and rhythm, no murmur. Gastrointestinal system: Abdomen is soft, non tender, non distended, BS+ Central nervous system: Alert and oriented x 3, no focal neurological deficits. Extremities: No edema, no cyanosis, no clubbing. Skin: No rashes, lesions or ulcers Psychiatry: Judgement and insight appear normal. Mood & affect appropriate.     Data Reviewed: I have personally reviewed following labs and imaging studies  CBC: Recent Labs  Lab 02/10/22 1225 02/11/22 0250 02/12/22 0420 02/14/22 0634  WBC 6.0 4.3 9.3 10.5  NEUTROABS 3.6  --   --   --   HGB 11.9* 10.6* 9.7* 12.1  HCT 36.0 32.3* 30.3* 37.3  MCV 92.8 93.6 95.0 94.7  PLT 230 203 245 [id number]   Basic Metabolic Panel: Recent Labs  Lab 02/10/22 1225 02/10/22 1835 02/11/22 0400 02/12/22 0420 02/14/22 0634  NA 135  --  137 139 140  K 3.2*  --  4.4 3.9 3.8  CL 91*  --  103 105 99  CO2 30  --  26 26 31   GLUCOSE 075-GRM*  --  178* 117* 105*  BUN 30*  --  32* 24* 12  CREATININE 2.00*  --  1.37* 0.81 0.85  CALCIUM 8.9  --  8.4* 8.7* 9.3  MG  --  1.7  --  1.7 1.6*  PHOS  --   --   --  1.7* 2.6   GFR: Estimated Creatinine Clearance: 46.2 mL/min (by C-G formula based on SCr of 0.85 mg/dL). Liver Function Tests: Recent Labs  Lab 02/10/22 1225  AST 21  ALT 13  ALKPHOS 66  BILITOT 0.6  PROT 7.4  ALBUMIN 4.1   No results for input(s): "LIPASE", "AMYLASE" in the  last 168 hours. No results for input(s): "AMMONIA" in the last 168 hours. Coagulation Profile: No results for input(s): "INR", "PROTIME" in the last 168 hours. Cardiac Enzymes: Recent Labs  Lab 02/10/22 1225  CKTOTAL 69   BNP (last 3 results) No results for input(s): "PROBNP" in the last 8760 hours. HbA1C: No results for input(s): "HGBA1C" in the last 72 hours. CBG: Recent Labs  Lab 02/12/22 1332  GLUCAP 92   Lipid Profile: No results for input(s): "CHOL", "HDL", "LDLCALC", "TRIG", "CHOLHDL", "LDLDIRECT" in the last 72 hours. Thyroid Function Tests: No results for input(s): "TSH", "T4TOTAL", "FREET4", "T3FREE", "THYROIDAB" in the last 72 hours. Anemia Panel: No results for input(s): "VITAMINB12", "FOLATE", "FERRITIN", "TIBC", "IRON", "RETICCTPCT" in the last 72 hours. Sepsis Labs: No results for input(s): "PROCALCITON", "LATICACIDVEN" in the last 168 hours.  Recent Results (from the past 240 hour(s))  Resp Panel by RT-PCR (Flu A&B, Covid) Anterior Nasal Swab     Status: None   Collection Time: 02/10/22  2:13 PM   Specimen: Anterior Nasal Swab  Result Value Ref Range Status   SARS Coronavirus 2 by RT PCR NEGATIVE NEGATIVE Final    Comment: (NOTE) SARS-CoV-2 target nucleic acids are NOT DETECTED.  The SARS-CoV-2 RNA is generally detectable in upper respiratory specimens during the acute phase of infection. The lowest concentration of SARS-CoV-2 viral copies this assay can detect is 138 copies/mL. A negative result does not preclude SARS-Cov-2 infection and should not be used as the sole basis for treatment or other patient management decisions. A negative result may occur with  improper specimen collection/handling, submission of specimen other than nasopharyngeal swab, presence of viral mutation(s) within the areas targeted by this assay, and inadequate number of viral copies(<138 copies/mL). A negative result must be combined with clinical observations, patient  history, and epidemiological information. The expected result is Negative.  Fact Sheet for Patients:  EntrepreneurPulse.com.au  Fact Sheet for Healthcare Providers:  IncredibleEmployment.be  This test is no t yet approved or cleared by the [place] FDA and  has been authorized for detection and/or diagnosis of SARS-CoV-2 by FDA under an Emergency Use Authorization (EUA). This EUA will remain  in effect (meaning this test can be used) for the duration of the COVID-19 declaration under Section 564(b)(1) of the Act, 21 U.S.C.section 360bbb-3(b)(1), unless the authorization is terminated  or revoked sooner.       Influenza A by PCR NEGATIVE NEGATIVE Final   Influenza B by PCR NEGATIVE NEGATIVE Final    Comment: (NOTE) The Xpert Xpress SARS-CoV-2/FLU/RSV plus assay is intended as an aid in the diagnosis of influenza from Nasopharyngeal swab specimens and should not be used as a sole basis for treatment. Nasal washings and aspirates are unacceptable for Xpert Xpress SARS-CoV-2/FLU/RSV testing.  Fact Sheet for Patients: EntrepreneurPulse.com.au  Fact Sheet for Healthcare Providers: IncredibleEmployment.be  This test is not yet approved or cleared by the [place] FDA and has been authorized for detection and/or diagnosis of SARS-CoV-2 by FDA under an Emergency Use Authorization (EUA). This EUA will remain in effect (meaning this test can be used) for the duration of the COVID-19 declaration under Section 564(b)(1) of the Act, 21 U.S.C. section 360bbb-3(b)(1), unless the authorization is terminated or revoked.  Performed at [facility], [address] [place], [place] [address]     Radiology Studies: No results found.  Scheduled Meds:  amLODipine  10 mg Oral Daily   aspirin EC  81 mg Oral Daily   azithromycin  250 mg Oral Daily   buPROPion  150 mg Oral Daily   clonazePAM  1 mg Oral  BID   DULoxetine  60 mg Oral Daily   enoxaparin (LOVENOX) injection  40 mg Subcutaneous Q24H   gabapentin  100 mg Oral BID   losartan  100 mg Oral Daily   And   hydrochlorothiazide  12.5 mg Oral Daily   ipratropium-albuterol  3 mL Nebulization BID   mometasone-formoterol  2 puff Inhalation BID   mouth rinse  15 mL Mouth Rinse 4 times per day   OXcarbazepine  150 mg Oral q morning   OXcarbazepine  300 mg Oral QHS   polyethylene glycol  17 g Oral Daily   rivastigmine  1.5 mg Oral BID   sodium chloride flush  3 mL Intravenous Q12H   Continuous Infusions:  sodium chloride  magnesium sulfate bolus IVPB       LOS: 4 days    Time spent: 35 mins    Berlie Persky, MD [facility]   If 7PM-7AM, please contact night-coverage

## 2022-02-15 DIAGNOSIS — F03918 Unspecified dementia, unspecified severity, with other behavioral disturbance: Secondary | ICD-10-CM | POA: Diagnosis not present

## 2022-02-15 DIAGNOSIS — J121 Respiratory syncytial virus pneumonia: Secondary | ICD-10-CM | POA: Diagnosis present

## 2022-02-15 DIAGNOSIS — J441 Chronic obstructive pulmonary disease with (acute) exacerbation: Secondary | ICD-10-CM | POA: Diagnosis not present

## 2022-02-15 DIAGNOSIS — J9601 Acute respiratory failure with hypoxia: Secondary | ICD-10-CM | POA: Diagnosis not present

## 2022-02-15 LAB — CBC
HCT: 32.5 % — ABNORMAL LOW (ref 36.0–46.0)
Hemoglobin: 10.6 g/dL — ABNORMAL LOW (ref 12.0–15.0)
MCH: 30.8 pg (ref 26.0–34.0)
MCHC: 32.6 g/dL (ref 30.0–36.0)
MCV: 94.5 fL (ref 80.0–100.0)
Platelets: 295 10*3/uL (ref 150–400)
RBC: 3.44 MIL/uL — ABNORMAL LOW (ref 3.87–5.11)
RDW: 13.4 % (ref 11.5–15.5)
WBC: 8.3 10*3/uL (ref 4.0–10.5)
nRBC: 0 % (ref 0.0–0.2)

## 2022-02-15 LAB — BASIC METABOLIC PANEL
Anion gap: 8 (ref 5–15)
BUN: 20 mg/dL (ref 8–23)
CO2: 32 mmol/L (ref 22–32)
Calcium: 8.9 mg/dL (ref 8.9–10.3)
Chloride: 100 mmol/L (ref 98–111)
Creatinine, Ser: 1.14 mg/dL — ABNORMAL HIGH (ref 0.44–1.00)
GFR, Estimated: 48 mL/min — ABNORMAL LOW (ref >60)
Glucose, Bld: 107 mg/dL — ABNORMAL HIGH (ref 70–99)
Potassium: 3.4 mmol/L — ABNORMAL LOW (ref 3.5–5.1)
Sodium: 140 mmol/L (ref 135–145)

## 2022-02-15 LAB — BRAIN NATRIURETIC PEPTIDE: B Natriuretic Peptide: 101.3 pg/mL — ABNORMAL HIGH (ref 0.0–100.0)

## 2022-02-15 LAB — MAGNESIUM: Magnesium: 1.4 mg/dL — ABNORMAL LOW (ref 1.7–2.4)

## 2022-02-15 LAB — PHOSPHORUS: Phosphorus: 2.3 mg/dL — ABNORMAL LOW (ref 2.5–4.6)

## 2022-02-15 MED ORDER — PREDNISONE 20 MG PO TABS
40.0000 mg | ORAL_TABLET | Freq: Every day | ORAL | Status: DC
  Administered 2022-02-15: 40 mg via ORAL
  Filled 2022-02-15: qty 2

## 2022-02-15 MED ORDER — MAGNESIUM SULFATE 4 GM/100ML IV SOLN
4.0000 g | Freq: Once | INTRAVENOUS | Status: AC
  Administered 2022-02-15: 4 g via INTRAVENOUS
  Filled 2022-02-15: qty 100

## 2022-02-15 MED ORDER — CLONAZEPAM 0.5 MG PO TABS
0.5000 mg | ORAL_TABLET | Freq: Every day | ORAL | Status: DC
  Administered 2022-02-16 – 2022-02-18 (×3): 0.5 mg via ORAL
  Filled 2022-02-15 (×3): qty 1

## 2022-02-15 MED ORDER — QUETIAPINE FUMARATE 25 MG PO TABS
25.0000 mg | ORAL_TABLET | Freq: Every day | ORAL | Status: DC
  Administered 2022-02-15 – 2022-02-16 (×2): 25 mg via ORAL
  Filled 2022-02-15 (×2): qty 1

## 2022-02-15 MED ORDER — POTASSIUM CHLORIDE 20 MEQ PO PACK
40.0000 meq | PACK | Freq: Once | ORAL | Status: AC
  Administered 2022-02-15: 40 meq via ORAL
  Filled 2022-02-15: qty 2

## 2022-02-15 NOTE — Progress Notes (Signed)
  Progress Note   Patient: Samantha Carpenter [id number]:[id number] DOB: 02-Aug-1940 DOA: 02/10/2022     5 DOS: the patient was seen and examined on 02/15/2022   Brief hospital course: This 81 years old female with PMH significant for COPD, hypertension, stage IIIa CKD, dementia, dyslipidemia presented the ED for the evaluation after she was found with her head down in the dining hall and appeared to be more short of breath when compared to her baseline. Patient had a fall the night prior to her admission and hit her head. When EMS arrived patient was found to have audible wheezes and received Solu-Medrol 125 mg IV and 2 back-to-back nebulizer treatment prior to being transported in the ER. Patient was also found to be having altered mental status as compared to baseline. She was placed on BiPAP for increased work of breathing. Patient was admitted for further evaluation.   Assessment and Plan: Acute hypoxic respiratory failure: Acute Exacerbation of COPD: RSV pneumonia. Patient initially required BiPAP at time of admission.  Condition is improving, however, patient is still on 4 L oxygen.  Respiratory panel was positive for respiratory syncytial virus. I will continue oral prednisone for now. Continue oxygen treatment.  Dementia with behavioral problems. Patient has significant confusion and agitation, sitter at bedside. Discussed with RN, patient is quite agitated at nighttime, but is sleeping all day at daytime.  Sleeping cycle is reversed.  She is taking high dose of Xanax twice a day, I will change to 0.5 mg every evening.  Discontinue daytime dose.  Continue Haldol at daytime as needed, also added Seroquel at night.  Hypokalemia Hypomagnesemia Acute kidney injury on chronic kidney disease stage IIIb. Improved, replete potassium and magnesium via IV.   Essential hypertension. Discontinue HCTZ.      Subjective:  Patient is very sleepy today, discussed with nurse, patient was agitated did not  sleep last night.  She is still on 4 L oxygen, does not seem to being short of breath.  Physical Exam: Vitals:   02/15/22 0002 02/15/22 0416 02/15/22 0746 02/15/22 1052  BP: (!) 160/72 (!) 161/72  (!) 134/47  Pulse: (!) 108 83  88  Resp: 16 18  17   Temp: 98 F (36.7 C) 97.6 F (36.4 C)  97.6 F (36.4 C)  TempSrc:  Axillary    SpO2: 92% 97% 94% 90%  Weight:      Height:       General exam: Appears calm and comfortable  Respiratory system: Decreased breath sounds. Respiratory effort normal. Cardiovascular system: S1 & S2 heard, RRR. No JVD, murmurs, rubs, gallops or clicks. No pedal edema. Gastrointestinal system: Abdomen is nondistended, soft and nontender. No organomegaly or masses felt. Normal bowel sounds heard. Central nervous system: Very drowsy and confused. Extremities: Symmetric 5 x 5 power. Skin: No rashes, lesions or ulcers   Data Reviewed:  Reviewed chest x-ray, lab results.  Family Communication: Called niece and sister, could not get through  Disposition: Status is: Inpatient Remains inpatient appropriate because: Severity of disease, altered mental status.  Planned Discharge Destination: Skilled nursing facility    Time spent: 50 minutes  Author: , MD 02/15/2022 1:01 PM  For on call review [email or web address].

## 2022-02-15 NOTE — Progress Notes (Addendum)
CSW phoned [facility], lvm for Meg about possible pt discharge with foley cath. Also contacted [facility] @ [place] to give same information.  CSW will continue to follow.  [address] [place], LCSW [phone]

## 2022-02-15 NOTE — Hospital Course (Addendum)
This 81 years old female with PMH significant for COPD, hypertension, stage IIIa CKD, dementia, dyslipidemia presented the ED for the evaluation after she was found with her head down in the dining hall and appeared to be more short of breath when compared to her baseline. Patient had a fall the night prior to her admission and hit her head. When EMS arrived patient was found to have audible wheezes and received Solu-Medrol 125 mg IV and 2 back-to-back nebulizer treatment prior to being transported in the ER. Patient was also found to be having altered mental status as compared to baseline. She was placed on BiPAP for increased work of breathing. Patient was admitted for further evaluation.  Patient continues to have significant bronchospasm, steroid changed to IV.  Patient was very combative, not able to sleep at night.  Seroquel was given.  12/3: DC tele

## 2022-02-15 NOTE — Progress Notes (Signed)
PULMONOLOGY         Date: 02/15/2022,   MRN# [id number] Jackson LatinoBarbara Scarfone 11/18/1940     AdmissionWeight: 68 kg                 CurrentWeight: 68 kg  Referring provider: Dr Lucianne MussKumar   CHIEF COMPLAINT:   Acute on chronic hypoxemic respiratory failure   HISTORY OF PRESENT ILLNESS   This is a pleasant 81 year old female with a history of chronic kidney disease, dementia, depression, essential hypertension, liver disease, COPD with chronic hypoxemia who came in for onset of shortness of breath after sustaining mechanical fall.  She comes from residence in a dining hall and seem to be with acute onset of respiratory distress and reported shortness of breath.  She did not lose consciousness but reported hitting her head on the ground, CT head in the emergency room was negative for acute intracranial changes however did show a scalp contusion and a small hematoma approximately 5 mm in thickness, additional findings include mild sinusitis..  She did have audible wheezing on arrival and did receive IV Solu-Medrol as well as placed on BiPAP noninvasive ventilator for respiratory support.  Blood work did reveal what appears to be acute on chronic renal insufficiency with a creatinine increased to 2.0.  She received resuscitative fluids in the ER and was admitted in the hospital for further evaluation.  She remains dyspneic with hypoxemia and pulmonary consultation was placed for ongoing shortness of breath with advanced centrilobular emphysema.  She had a chest x-ray done which shows normal lung contours without pneumothorax, pulmonary edema or infiltrate.  Pulmonary consultation placed for additional evaluation and management   02/14/22- patient is stable on room air, she is not having labored breathing and is able to speak in full sentences.  She seems to have dementia on examination of mood and neurologic examination.  She also has difficulty with hearing. From pulmonary perspective she is  cleared for dc home.  I would recommend palliative care evaluation.   02/15/22- patient is stable for dc home from pulmonary perspective   PAST MEDICAL HISTORY   Past Medical History:  Diagnosis Date   Chronic kidney disease    COPD (chronic obstructive pulmonary disease) (HCC)    Dementia (HCC)    Depression    Hypertension    Liver disease      SURGICAL HISTORY   Past Surgical History:  Procedure Laterality Date   ABDOMINAL HYSTERECTOMY     CHOLECYSTECTOMY     ESOPHAGOGASTRODUODENOSCOPY N/A 11/20/2019   Procedure: ESOPHAGOGASTRODUODENOSCOPY (EGD);  Surgeon: Regis BillLocklear, Cameron T, MD;  [facility];  Service: Gastroenterology;  Laterality: N/A;   face tuck       FAMILY HISTORY   Family History  Problem Relation Age of Onset   Bipolar disorder Brother    Schizophrenia Brother    Bipolar disorder Sister    Schizophrenia Sister    Drug abuse Son      SOCIAL HISTORY   Social History   Tobacco Use   Smoking status: Former    Types: Cigarettes    Quit date: 09/01/2009    Years since quitting: 12.4   Smokeless tobacco: Never  Vaping Use   Vaping Use: Never used  Substance Use Topics   Alcohol use: Not Currently   Drug use: Not Currently     MEDICATIONS    Home Medication:    Current Medication:  Current Facility-Administered Medications:    0.9 %  sodium chloride  infusion, 250 mL, Intravenous, PRN, Agbata, Tochukwu, MD   acetaminophen (TYLENOL) tablet 650 mg, 650 mg, Oral, Q6H PRN **OR** acetaminophen (TYLENOL) suppository 650 mg, 650 mg, Rectal, Q6H PRN, Agbata, Tochukwu, MD   amLODipine (NORVASC) tablet 10 mg, 10 mg, Oral, Daily, Agbata, Tochukwu, MD, 10 mg at 02/15/22 1251   aspirin EC tablet 81 mg, 81 mg, Oral, Daily, Agbata, Tochukwu, MD, 81 mg at 02/15/22 1251   azithromycin (ZITHROMAX) tablet 250 mg, 250 mg, Oral, Daily, Karna Christmas, Yocelin Vanlue, MD, 250 mg at 02/14/22 0900   buPROPion (WELLBUTRIN XL) 24 hr tablet 150 mg, 150 mg, Oral, Daily,  Agbata, Tochukwu, MD, 150 mg at 02/15/22 1251   clonazePAM (KLONOPIN) tablet 1 mg, 1 mg, Oral, BID, Agbata, Tochukwu, MD, 1 mg at 02/15/22 1251   DULoxetine (CYMBALTA) DR capsule 60 mg, 60 mg, Oral, Daily, Agbata, Tochukwu, MD, 60 mg at 02/15/22 1251   enoxaparin (LOVENOX) injection 40 mg, 40 mg, Subcutaneous, Q24H, Coulter, Carolyn, RPH, 40 mg at 02/13/22 2059   gabapentin (NEURONTIN) capsule 100 mg, 100 mg, Oral, BID, Agbata, Tochukwu, MD, 100 mg at 02/15/22 0136   haloperidol (HALDOL) tablet 2 mg, 2 mg, Oral, Q6H PRN **OR** haloperidol lactate (HALDOL) injection 2 mg, 2 mg, Intravenous, Q6H PRN, Foust, Katy L, NP, 2 mg at 02/11/22 2136   ipratropium-albuterol (DUONEB) 0.5-2.5 (3) MG/3ML nebulizer solution 3 mL, 3 mL, Nebulization, BID, Agbata, Tochukwu, MD, 3 mL at 02/15/22 0746   losartan (COZAAR) tablet 100 mg, 100 mg, Oral, Daily, 100 mg at 02/15/22 1251 **AND** [DISCONTINUED] hydrochlorothiazide (HYDRODIURIL) tablet 12.5 mg, 12.5 mg, Oral, Daily, Barrie Folk, RPH, 12.5 mg at 02/14/22 3976   magnesium oxide (MAG-OX) tablet 400 mg, 400 mg, Oral, BID, Cipriano Bunker, MD, 400 mg at 02/15/22 1251   magnesium sulfate IVPB 4 g 100 mL, 4 g, Intravenous, Once, Marrion Coy, MD   mometasone-formoterol (DULERA) 200-5 MCG/ACT inhaler 2 puff, 2 puff, Inhalation, BID, Agbata, Tochukwu, MD, 2 puff at 02/14/22 1950   ondansetron (ZOFRAN) tablet 4 mg, 4 mg, Oral, Q6H PRN **OR** ondansetron (ZOFRAN) injection 4 mg, 4 mg, Intravenous, Q6H PRN, Agbata, Tochukwu, MD, 4 mg at 02/12/22 0037   Oral care mouth rinse, 15 mL, Mouth Rinse, 4 times per day, Agbata, Tochukwu, MD, 15 mL at 02/14/22 1950   Oral care mouth rinse, 15 mL, Mouth Rinse, PRN, Agbata, Tochukwu, MD   OXcarbazepine (TRILEPTAL) tablet 150 mg, 150 mg, Oral, q morning, Foye Deer, RPH, 150 mg at 02/14/22 7341   Oxcarbazepine (TRILEPTAL) tablet 300 mg, 300 mg, Oral, QHS, Foye Deer, RPH, 300 mg at 02/15/22 0136   polyethylene glycol  (MIRALAX / GLYCOLAX) packet 17 g, 17 g, Oral, Daily, Agbata, Tochukwu, MD, 17 g at 02/14/22 0905   rivastigmine (EXELON) capsule 1.5 mg, 1.5 mg, Oral, BID, Agbata, Tochukwu, MD, 1.5 mg at 02/15/22 0138   sodium chloride flush (NS) 0.9 % injection 3 mL, 3 mL, Intravenous, Q12H, Agbata, Tochukwu, MD, 3 mL at 02/13/22 2247   sodium chloride flush (NS) 0.9 % injection 3 mL, 3 mL, Intravenous, PRN, Agbata, Tochukwu, MD    ALLERGIES   Morphine and related and Latex     REVIEW OF SYSTEMS    Review of Systems:  Gen:  Denies  fever, sweats, chills weigh loss  HEENT: Denies blurred vision, double vision, ear pain, eye pain, hearing loss, nose bleeds, sore throat Cardiac:  No dizziness, chest pain or heaviness, chest tightness,edema Resp:   reports dyspnea chronically  Gi:  Denies swallowing difficulty, stomach pain, nausea or vomiting, diarrhea, constipation, bowel incontinence Gu:  Denies bladder incontinence, burning urine Ext:   Denies Joint pain, stiffness or swelling Skin: Denies  skin rash, easy bruising or bleeding or hives Endoc:  Denies polyuria, polydipsia , polyphagia or weight change Psych:   Denies depression, insomnia or hallucinations   Other:  All other systems negative   VS: BP (!) 134/47 (BP Location: Right Arm)   Pulse 88   Temp 97.6 F (36.4 C)   Resp 17   Ht 5\' 2"  (1.575 m)   Wt 68 kg   SpO2 90%   BMI 27.44 kg/m      PHYSICAL EXAM    GENERAL:NAD, no fevers, chills, no weakness no fatigue HEAD: Normocephalic, atraumatic.  EYES: Pupils equal, round, reactive to light. Extraocular muscles intact. No scleral icterus.  MOUTH: Moist mucosal membrane. Dentition intact. No abscess noted.  EAR, NOSE, THROAT: Clear without exudates. No external lesions.  NECK: Supple. No thyromegaly. No nodules. No JVD.  PULMONARY: decreased breath sounds with mild rhonchi worse at bases bilaterally.  CARDIOVASCULAR: S1 and S2. Regular rate and rhythm. No murmurs, rubs, or  gallops. No edema. Pedal pulses 2+ bilaterally.  GASTROINTESTINAL: Soft, nontender, nondistended. No masses. Positive bowel sounds. No hepatosplenomegaly.  MUSCULOSKELETAL: No swelling, clubbing, or edema. Range of motion full in all extremities.  NEUROLOGIC: Cranial nerves II through XII are intact. No gross focal neurological deficits. Sensation intact. Reflexes intact.  SKIN: No ulceration, lesions, rashes, or cyanosis. Skin warm and dry. Turgor intact.  PSYCHIATRIC: Mood, affect within normal limits. The patient is awake, alert and oriented x 3. Insight, judgment intact.       IMAGING     ASSESSMENT/PLAN   Acute on chronic hypoxemic respiratory failure Patient is negative for COVID-19, negative for flu a and B Respiratory viral panel performed 01/08/2022 is negative as well -Patient seems to have moderate acute exacerbation of COPD will treat with COPD care path as well as repeat respiratory viral panel in order inflammatory biomarkers with CRP to determine if any acute changes are ongoing. -Repeat BMP with improved renal function and resolution of previously noted AKI -CBC without leukocytosis and mild decrement in hemoglobin likely due to dilutional effects from resuscitative fluids -Agree with DuoNebs 4 times daily as needed, agree with tapering dosing of prednisone, currently on 40 mg will reduce to 30 mg p.o. daily -Adding Zithromax 250 p.o. 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   Patient/Family are satisfied with care plan and all questions have been answered.    Provider disclosure: Patient with at least one acute or chronic illness or injury that poses a threat to life or bodily function and is being managed actively during this encounter.  All of the below services have been performed independently by signing provider:  review of prior documentation from internal and or external health records.  Review of previous and current lab results.  Interview and comprehensive assessment during patient visit today. Review of current and previous chest radiographs/CT scans. Discussion of  management and test interpretation with health care team and patient/family.   This document was prepared using Dragon voice recognition software and may include unintentional dictation errors.     Vida Rigger, M.D.  Division of Pulmonary & Critical Care Medicine

## 2022-02-16 DIAGNOSIS — J121 Respiratory syncytial virus pneumonia: Secondary | ICD-10-CM

## 2022-02-16 DIAGNOSIS — F03918 Unspecified dementia, unspecified severity, with other behavioral disturbance: Secondary | ICD-10-CM | POA: Diagnosis not present

## 2022-02-16 DIAGNOSIS — J441 Chronic obstructive pulmonary disease with (acute) exacerbation: Secondary | ICD-10-CM | POA: Diagnosis not present

## 2022-02-16 DIAGNOSIS — J9601 Acute respiratory failure with hypoxia: Secondary | ICD-10-CM | POA: Diagnosis not present

## 2022-02-16 LAB — PHOSPHORUS: Phosphorus: 3.1 mg/dL (ref 2.5–4.6)

## 2022-02-16 LAB — BASIC METABOLIC PANEL
Anion gap: 7 (ref 5–15)
BUN: 17 mg/dL (ref 8–23)
CO2: 33 mmol/L — ABNORMAL HIGH (ref 22–32)
Calcium: 8.9 mg/dL (ref 8.9–10.3)
Chloride: 99 mmol/L (ref 98–111)
Creatinine, Ser: 0.88 mg/dL (ref 0.44–1.00)
GFR, Estimated: >60 mL/min (ref >60)
Glucose, Bld: 137 mg/dL — ABNORMAL HIGH (ref 70–99)
Potassium: 4.3 mmol/L (ref 3.5–5.1)
Sodium: 139 mmol/L (ref 135–145)

## 2022-02-16 LAB — MAGNESIUM: Magnesium: 2.4 mg/dL (ref 1.7–2.4)

## 2022-02-16 MED ORDER — METHYLPREDNISOLONE SODIUM SUCC 40 MG IJ SOLR
40.0000 mg | INTRAMUSCULAR | Status: DC
  Administered 2022-02-16 – 2022-02-17 (×2): 40 mg via INTRAVENOUS
  Filled 2022-02-16 (×2): qty 1

## 2022-02-16 MED ORDER — IPRATROPIUM-ALBUTEROL 0.5-2.5 (3) MG/3ML IN SOLN
3.0000 mL | Freq: Four times a day (QID) | RESPIRATORY_TRACT | Status: DC
  Administered 2022-02-16 – 2022-02-17 (×3): 3 mL via RESPIRATORY_TRACT
  Filled 2022-02-16 (×3): qty 3

## 2022-02-16 NOTE — Progress Notes (Signed)
  Progress Note   Patient: Samantha Carpenter JKD:[id number] DOB: 09/03/1940 DOA: 02/10/2022     6 DOS: the patient was seen and examined on 02/16/2022   Brief hospital course: This 81 years old female with PMH significant for COPD, hypertension, stage IIIa CKD, dementia, dyslipidemia presented the ED for the evaluation after she was found with her head down in the dining hall and appeared to be more short of breath when compared to her baseline. Patient had a fall the night prior to her admission and hit her head. When EMS arrived patient was found to have audible wheezes and received Solu-Medrol 125 mg IV and 2 back-to-back nebulizer treatment prior to being transported in the ER. Patient was also found to be having altered mental status as compared to baseline. She was placed on BiPAP for increased work of breathing. Patient was admitted for further evaluation.  Patient continues to have significant bronchospasm, steroid changed to IV.  Patient was very combative, not able to sleep at night.  Seroquel was given.  Assessment and Plan: Acute hypoxic respiratory failure: Acute Exacerbation of COPD: RSV pneumonia. Patient initially required BiPAP at time of admission.  Respiratory panel was positive for respiratory syncytial virus. Oxygenation is better today, down to 1 L.  However patient has more bronchospasm today.  I will change steroids to IV again. Continue scheduled bronchodilator.  Appreciate pulmonology consult.   Dementia with behavioral problems. Patient has significant confusion and agitation. Discussed with RN, patient is quite agitated at nighttime, but is sleeping all day at daytime.  Sleeping cycle is reversed.  She is taking high dose of Xanax twice a day, I changed to 0.5 mg every evening.  Discontinue daytime dose.  Continue Haldol at daytime as needed, also added Seroquel at night. Discussed with RN, patient slept overnight, she is no longer combative today.  Continue current  regimen.  Hypokalemia Hypomagnesemia Acute kidney injury on chronic kidney disease stage IIIb. Condition all improved.   Essential hypertension. Discontinue HCTZ.       Subjective:  Spoke with RN, patient slept well last night.  She no longer is agitated.  But she has more wheezing this morning, currently on 1 L oxygen.  Physical Exam: Vitals:   02/15/22 2041 02/15/22 2114 02/16/22 0502 02/16/22 0818  BP: (!) 149/69 116/70 (!) 140/68 (!) 145/59  Pulse: 88 86 71 79  Resp: 16 16 (!) 22 20  Temp: (!) 97.4 F (36.3 C) 98.2 F (36.8 C) 99 F (37.2 C) 98 F (36.7 C)  TempSrc: Oral Oral Oral Oral  SpO2: 95%  97% 92%  Weight:      Height:       General exam: Appears calm and comfortable  Respiratory system: Decreased breathing sounds with wheezes. Respiratory effort normal. Cardiovascular system: S1 & S2 heard, RRR. No JVD, murmurs, rubs, gallops or clicks. No pedal edema. Gastrointestinal system: Abdomen is nondistended, soft and nontender. No organomegaly or masses felt. Normal bowel sounds heard. Central nervous system: Alert and oriented x2. No focal neurological deficits. Extremities: Symmetric 5 x 5 power. Skin: No rashes, lesions or ulcers Psychiatry:Mood & affect appropriate.   Data Reviewed:  Lab results reviewed.  Family Communication: Could not able to reach family  Disposition: Status is: Inpatient Remains inpatient appropriate because: Severity of disease, IV treatment, altered mental status.  Planned Discharge Destination: Skilled nursing facility    Time spent: 50 minutes  Author: Marrion Coy, MD 02/16/2022 11:19 AM  For on call review [email or web address].

## 2022-02-16 NOTE — Care Management Important Message (Signed)
Important Message  Patient Details  Name: Samantha Carpenter MRN: [id number] Date of Birth: 1940/07/09   Medicare Important Message Given:  Yes     Johnell Comings 02/16/2022, 12:35 PM

## 2022-02-16 NOTE — Progress Notes (Addendum)
Mobility Specialist - Progress Note   02/16/22 1547  Mobility  Activity Ambulated with assistance in hallway  Level of Assistance Standby assist, set-up cues, supervision of patient - no hands on  Assistive Device Front wheel walker  Distance Ambulated (ft) 320 ft  Activity Response Tolerated well  $Mobility charge 1 Mobility     Pt sitting in recliner upon arrival, utilizing 1L with sats 98%. Pt STS and ambulation in hallway with minG. Narrow BOS. A little wobbly at times, but no LOB. Decreased safety awareness and a little distracted at times. Cued for maintaining forward gaze. Follows commands fairly well. Mild SOB during activity. Pt returned to bed with alarm set, needs in reach.    Samantha Carpenter Mobility Specialist 02/16/22, 3:53 PM

## 2022-02-17 DIAGNOSIS — J441 Chronic obstructive pulmonary disease with (acute) exacerbation: Secondary | ICD-10-CM | POA: Diagnosis not present

## 2022-02-17 LAB — CREATININE, SERUM
Creatinine, Ser: 0.98 mg/dL (ref 0.44–1.00)
GFR, Estimated: 58 mL/min — ABNORMAL LOW (ref >60)

## 2022-02-17 MED ORDER — IPRATROPIUM-ALBUTEROL 0.5-2.5 (3) MG/3ML IN SOLN
3.0000 mL | Freq: Four times a day (QID) | RESPIRATORY_TRACT | Status: DC
  Administered 2022-02-17: 3 mL via RESPIRATORY_TRACT
  Filled 2022-02-17: qty 3

## 2022-02-17 MED ORDER — PREDNISONE 20 MG PO TABS
40.0000 mg | ORAL_TABLET | Freq: Every day | ORAL | Status: DC
  Administered 2022-02-18 – 2022-02-19 (×2): 40 mg via ORAL
  Filled 2022-02-17 (×2): qty 2

## 2022-02-17 MED ORDER — QUETIAPINE FUMARATE 25 MG PO TABS
50.0000 mg | ORAL_TABLET | Freq: Every day | ORAL | Status: DC
  Administered 2022-02-17 – 2022-02-18 (×2): 50 mg via ORAL
  Filled 2022-02-17 (×2): qty 2

## 2022-02-17 MED ORDER — POLYETHYLENE GLYCOL 3350 17 G PO PACK: 34.0000 g | PACK | Freq: Two times a day (BID) | ORAL | Status: DC

## 2022-02-17 MED ORDER — IPRATROPIUM-ALBUTEROL 0.5-2.5 (3) MG/3ML IN SOLN: 3.0000 mL | RESPIRATORY_TRACT | Status: DC | PRN

## 2022-02-17 MED ORDER — ACETYLCYSTEINE 20 % IN SOLN
4.0000 mL | Freq: Two times a day (BID) | RESPIRATORY_TRACT | Status: DC
  Administered 2022-02-17 – 2022-02-19 (×4): 4 mL via RESPIRATORY_TRACT
  Filled 2022-02-17 (×6): qty 4

## 2022-02-17 MED ORDER — IPRATROPIUM-ALBUTEROL 0.5-2.5 (3) MG/3ML IN SOLN
3.0000 mL | Freq: Four times a day (QID) | RESPIRATORY_TRACT | Status: DC
  Administered 2022-02-17 – 2022-02-19 (×8): 3 mL via RESPIRATORY_TRACT
  Filled 2022-02-17 (×8): qty 3

## 2022-02-17 MED ORDER — POLYETHYLENE GLYCOL 3350 17 G PO PACK
17.0000 g | PACK | Freq: Every day | ORAL | Status: DC
  Administered 2022-02-18 – 2022-02-19 (×2): 17 g via ORAL
  Filled 2022-02-17 (×2): qty 1

## 2022-02-17 NOTE — Progress Notes (Addendum)
  PROGRESS NOTE    Fianna Snowball  IPJ:[id number] DOB: [birth year] DOA: 02/10/2022 PCP: Pcp, No  244A/244A-AA  LOS: 7 days   Brief hospital course: Lindzy Rupert is a 81 years old female with PMH significant for COPD on home oxygen, hypertension, stage IIIa CKD, dementia, presented the ED for evaluation after she was found with her head down in the dining hall and appeared to be more short of breath when compared to her baseline.   Patient had a fall the night prior to her admission and hit her head. When EMS arrived patient was found to have audible wheezes and received Solu-Medrol 125 mg IV and 2 back-to-back nebulizer treatment prior to being transported in the ER. Patient was also found to be having altered mental status as compared to baseline. She was placed on BiPAP for increased work of breathing.   Assessment & Plan:   Acute on chronic hypoxic respiratory failure: Acute Exacerbation of COPD: RSV pneumonia. Patient initially required BiPAP at time of admission.  Respiratory panel was positive for respiratory syncytial virus. On 1-2L O2 via [place] which is likely her baseline (Pt reported wearing oxygen at home but couldn't tell me how many liters). --pt was on and off IV solumedrol for bronchospasm. --pulm consulted and already clear pt for discharge. Plan: --transition back to prednisone 40 mg daily tomorrow --schedule DuoNeb QID --start Mucomyst neb BID today for mucus clearance  Dementia with behavioral problems. Patient has significant confusion and agitation.  Per nursing, patient was quite agitated at nighttime, but sleeping all day at daytime.  Sleeping cycle was reversed.  --sleep pattern and agitation seemed improved after starting nightly seroquel. 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   Essential hypertension. --cont amlodipine and losartan    DVT prophylaxis: Lovenox SQ Code Status: DNR  Family Communication:  Level of care: Progressive Dispo:   The patient is from: ALF memory unit Anticipated d/c is to: ALF memory unit Anticipated d/c date is: tomorrow   Subjective and Interval History:  Pt complained of chronic dyspnea.  RN reported more wheezing this morning.  Had large BM, per nursing.   Objective: Vitals:   02/16/22 1648 02/16/22 2008 02/17/22 0005 02/17/22 0921  BP:  (!) 155/80 (!) 159/80 136/63  Pulse:  95 88   Resp: 18 20 19    Temp:  98.1 F (36.7 C) 98 F (36.7 C)   TempSrc:  Oral Oral   SpO2:  94% 92%   Weight:      Height:        Intake/Output Summary (Last 24 hours) at 02/17/2022 1825 Last data filed at 02/16/2022 2008 Gross per 24 hour  Intake 360 ml  Output --  Net 360 ml   Filed Weights   02/10/22 1617 02/11/22 2029  Weight: 68 kg 68 kg    Examination:   Constitutional: NAD, alert, oriented to person and place HEENT: conjunctivae and lids normal, EOMI CV: No cyanosis.   RESP: normal respiratory effort, Phlegmy, on 2L Neuro: II - XII grossly intact.     Data Reviewed: I have personally reviewed labs and imaging studies  Time spent: 50 minutes  2030, MD [facility] If 7PM-7AM, please contact night-coverage 02/17/2022, 6:25 PM

## 2022-02-17 NOTE — Evaluation (Signed)
Physical Therapy Evaluation Patient Details Name: Samantha Carpenter MRN: [id number] DOB: 23-Jan-1941 Today's Date: 02/17/2022  History of Present Illness  Pt is an 81 y.o. female presenting to hospital 02/10/22 with c/o SOB and 2 falls in past 24 hours.  Pt admitted with acute COPD exacerbation, acute respiratory failure with hypoxia, frequent falls, hypokalemia, and AKI.  (+) RSV.  PMH includes COPD, emphysema, advanced dementia, CKD.  Clinical Impression  Prior to hospital admission, pt reports being independent with ambulation (occasional use of cane); per chart pt lives at memory care ALF.  During session pt oriented to person, place, general situation, and month (not day or year).  Currently pt is modified independent with bed mobility; SBA with transfers; and CGA progressing to SBA ambulating to/from bathroom (pt occasionally holding onto furniture but pt appearing steady and safe with functional mobility).  No loss of balance noted with ambulation or dynamic standing activities during session.  Pt declined to ambulate further during session (d/t wanting to rest in bed) limiting sessions activities.  Pt would benefit from skilled PT to address noted impairments and functional limitations (see below for any additional details).  Upon hospital discharge, pt would benefit from [place].      Recommendations for follow up therapy are one component of a multi-disciplinary discharge planning process, led by the attending physician.  Recommendations may be updated based on patient status, additional functional criteria and insurance authorization.  Follow Up Recommendations Home health PT      Assistance Recommended at Discharge Intermittent Supervision/Assistance  Patient can return home with the following  A little help with walking and/or transfers;A little help with bathing/dressing/bathroom;Assistance with cooking/housework;Assist for transportation;Help with stairs or ramp for entrance    Equipment  Recommendations None recommended by PT  Recommendations for Other Services       Functional Status Assessment Patient has had a recent decline in their functional status and demonstrates the ability to make significant improvements in function in a reasonable and predictable amount of time.     Precautions / Restrictions Precautions Precautions: Fall Restrictions Weight Bearing Restrictions: No      Mobility  Bed Mobility Overal bed mobility: Modified Independent             General bed mobility comments: HOB elevated; semi-supine to/from sitting without any noted difficulties    Transfers Overall transfer level: Needs assistance Equipment used: None Transfers: Sit to/from Stand Sit to Stand: Supervision           General transfer comment: fairly strong stand from bed and from low toilet with minimal UE support    Ambulation/Gait Ambulation/Gait assistance: Min guard, Supervision Gait Distance (Feet):  (25 feet x2 (to/from bathroom)) Assistive device: None Gait Pattern/deviations: Step-through pattern       General Gait Details: pt occasionally putting hands on furniture on room but pt overall steady and safe  Stairs            Wheelchair Mobility    Modified Rankin (Stroke Patients Only)       Balance Overall balance assessment: Needs assistance Sitting-balance support: No upper extremity supported, Feet supported Sitting balance-Leahy Scale: Normal Sitting balance - Comments: steady sitting reaching outside BOS   Standing balance support: No upper extremity supported Standing balance-Leahy Scale: Good Standing balance comment: no loss of balance noted with ambulation or standing activities (washing hands at sink or performing peri-care post toileting)  Pertinent Vitals/Pain Pain Assessment Pain Assessment: No/denies pain Vitals (HR and O2 on 2 L via nasal cannula) stable and WFL throughout treatment  session.    Home Living Family/patient expects to be discharged to:: Assisted living                   Additional Comments: [facility] memory care ALF per chart review    Prior Function Prior Level of Function : Patient poor historian/Family not available             Mobility Comments: Pt reports occasionally using SPC but in general does not use any AD for mobility.       Hand Dominance        Extremity/Trunk Assessment   Upper Extremity Assessment Upper Extremity Assessment: Overall WFL for tasks assessed    Lower Extremity Assessment Lower Extremity Assessment: Generalized weakness    Cervical / Trunk Assessment Cervical / Trunk Assessment: Normal  Communication   Communication: No difficulties  Cognition Arousal/Alertness: Awake/alert Behavior During Therapy: WFL for tasks assessed/performed Overall Cognitive Status: No family/caregiver present to determine baseline cognitive functioning                                 General Comments: Oriented to person, place, general situation, and month (not day or year)        General Comments  Nursing cleared pt for participation in physical therapy.  Pt agreeable to PT session.    Exercises     Assessment/Plan    PT Assessment Patient needs continued PT services  PT Problem List Decreased strength;Decreased balance;Decreased mobility       PT Treatment Interventions DME instruction;Gait training;Functional mobility training;Therapeutic activities;Therapeutic exercise;Balance training;Patient/family education    PT Goals (Current goals can be found in the Care Plan section)  Acute Rehab PT Goals Patient Stated Goal: to improve breathing PT Goal Formulation: With patient Time For Goal Achievement: 03/03/22 Potential to Achieve Goals: Fair    Frequency Min 2X/week     Co-evaluation               AM-PAC PT "6 Clicks" Mobility  Outcome Measure Help needed turning from  your back to your side while in a flat bed without using bedrails?: None Help needed moving from lying on your back to sitting on the side of a flat bed without using bedrails?: None Help needed moving to and from a bed to a chair (including a wheelchair)?: A Little Help needed standing up from a chair using your arms (e.g., wheelchair or bedside chair)?: A Little Help needed to walk in hospital room?: A Little Help needed climbing 3-5 steps with a railing? : A Little 6 Click Score: 20    End of Session Equipment Utilized During Treatment: Gait belt;Oxygen (2 L via nasal cannula) Activity Tolerance: Patient tolerated treatment well Patient left: in bed;with call bell/phone within reach;with bed alarm set Nurse Communication: Mobility status;Precautions PT Visit Diagnosis: Muscle weakness (generalized) (M62.81);History of falling (Z91.81);Other abnormalities of gait and mobility (R26.89)    Time: 7035-0093 PT Time Calculation (min) (ACUTE ONLY): 17 min   Charges:   PT Evaluation $PT Eval Low Complexity: 1 Low PT Treatments $Therapeutic Activity: 8-22 mins       Hendricks Limes, PT 02/17/22, 5:44 PM

## 2022-02-18 DIAGNOSIS — N179 Acute kidney failure, unspecified: Secondary | ICD-10-CM

## 2022-02-18 DIAGNOSIS — J441 Chronic obstructive pulmonary disease with (acute) exacerbation: Secondary | ICD-10-CM | POA: Diagnosis not present

## 2022-02-18 DIAGNOSIS — F03918 Unspecified dementia, unspecified severity, with other behavioral disturbance: Secondary | ICD-10-CM | POA: Diagnosis not present

## 2022-02-18 DIAGNOSIS — J9601 Acute respiratory failure with hypoxia: Secondary | ICD-10-CM | POA: Diagnosis not present

## 2022-02-18 NOTE — Assessment & Plan Note (Signed)
Respi Panel + for RSV. Continue symptomatic mgmt. Empiric azithromycin, continue inhalers and nebulizer

## 2022-02-18 NOTE — Progress Notes (Signed)
Progress Note   Patient: Samantha Carpenter [id number] DOB: [birth year] DOA: 02/10/2022     8 DOS: the patient was seen and examined on 02/18/2022   Brief hospital course: This 81 years old female with PMH significant for COPD, hypertension, stage IIIa CKD, dementia, dyslipidemia presented the ED for the evaluation after she was found with her head down in the dining hall and appeared to be more short of breath when compared to her baseline. Patient had a fall the night prior to her admission and hit her head. When EMS arrived patient was found to have audible wheezes and received Solu-Medrol 125 mg IV and 2 back-to-back nebulizer treatment prior to being transported in the ER. Patient was also found to be having altered mental status as compared to baseline. She was placed on BiPAP for increased work of breathing. Patient was admitted for further evaluation.  Patient continues to have significant bronchospasm, steroid changed to IV.  Patient was very combative, not able to sleep at night.  Seroquel was given.  12/3: DC tele  Assessment and Plan: * COPD with acute exacerbation (HCC) Patient with a known history of COPD who presents to the ER for evaluation of worsening shortness of breath from her baseline with increased work of breathing initially requiring BiPAP.  Now on 2 L oxygen.  Wean as able Continue bronchodilator therapy   Acute respiratory failure with hypoxia (HCC) Secondary to acute COPD exacerbation Patient presented for evaluation of worsening shortness of breath from her baseline and had increased work of breathing initially requiring BiPAP She is weaned off BiPAP.  Now she is on 2 L oxygen we will continue weaning efforts   Dementia with behavioral disturbance (HCC) Continue Exelon and as needed Haldol.  RSV (respiratory syncytial virus pneumonia) Respi Panel + for RSV. Continue symptomatic mgmt. Empiric azithromycin, continue inhalers and  nebulizer  Hypomagnesemia Replaced  Falls frequently Patient has had 2 falls before admission Patient noted to have a left frontal scalp contusion with small hematoma measuring 5 mm in thickness from her recent fall Fall precautions PT, OT recommend ALF with home health  Hypokalemia Secondary to diuretic therapy Repleted and resolved   AKI (acute kidney injury) (HCC) Patient has a baseline serum creatinine of 1.23 and today on admission it is 2.0 AKI appears to be prerenal and related to diuretic use Held hydrochlorothiazide and losartan initially.  Losartan restarted now Resolved with hydration  Lab Results  Component Value Date   CREATININE 0.98 02/17/2022   CREATININE 0.88 02/16/2022   CREATININE 1.14 (H) 02/15/2022     HTN (hypertension) Continue amlodipine & losartan Hold bisoprolol due to bradycardia with prolonged PR interval Hold HCTZ for now  Depression with anxiety Continue bupropion and Klonopin.  Panlobular emphysema (HCC) Patient with known history of COPD Seen by pulmonary        Subjective: Continues to have some shortness of breath.  No new issues  Physical Exam: Vitals:   02/17/22 0921 02/17/22 2009 02/17/22 2046 02/17/22 2305  BP: 136/63 (!) 126/99  (!) 115/43  Pulse:  95  90  Resp:  20  20  Temp:  98.8 F (37.1 C)  97.9 F (36.6 C)  TempSrc:  Oral    SpO2:  98% 99% 93%  Weight:      Height:       Constitutional: NAD, alert, oriented to person and place HEENT: conjunctivae and lids normal, EOMI CV: No cyanosis.   RESP: normal respiratory effort, on 2L Neuro: Alert  and awake, nonfocal Data Reviewed:  There are no new results to review at this time.  Family Communication: None  Disposition: Status is: Inpatient Remains inpatient appropriate because: Slow improvement in her breathing and hypoxia  Planned Discharge Destination: Home with Home Health   DVT prophylaxis-Lovenox Time spent: 35 minutes  Author: Delfino Lovett,  MD 02/18/2022 5:11 PM  For on call review [email or web address].

## 2022-02-18 NOTE — Assessment & Plan Note (Signed)
Replaced. °

## 2022-02-19 DIAGNOSIS — N179 Acute kidney failure, unspecified: Secondary | ICD-10-CM | POA: Diagnosis not present

## 2022-02-19 DIAGNOSIS — J9601 Acute respiratory failure with hypoxia: Secondary | ICD-10-CM | POA: Diagnosis not present

## 2022-02-19 DIAGNOSIS — J441 Chronic obstructive pulmonary disease with (acute) exacerbation: Secondary | ICD-10-CM | POA: Diagnosis not present

## 2022-02-19 DIAGNOSIS — F03918 Unspecified dementia, unspecified severity, with other behavioral disturbance: Secondary | ICD-10-CM | POA: Diagnosis not present

## 2022-02-19 MED ORDER — QUETIAPINE FUMARATE 50 MG PO TABS: 50.0000 mg | ORAL_TABLET | Freq: Every day | ORAL | 0 refills | Status: DC

## 2022-02-19 NOTE — NC FL2 (Signed)
[place] MEDICAID FL2 LEVEL OF CARE FORM     IDENTIFICATION  Patient Name: Samantha Carpenter Birthdate: 1940/05/31 Sex: female Admission Date (Current Location): 02/10/2022  [facility] and [place] Number:  Chiropodist and Address:  [facility], [address] [place], [place] [address]      Provider Number: [id number]  Attending Physician Name and Address:  Delfino Lovett, MD  Relative Name and Phone Number:       Current Level of Care: Hospital Recommended Level of Care: Assisted Living Facility, Other (Comment) (Memory Care) Prior Approval Number:    Date Approved/Denied:   PASRR Number:    Discharge Plan: Other (Comment) (ALF)    Current Diagnoses: Patient Active Problem List   Diagnosis Date Noted   Hypomagnesemia 02/15/2022   RSV (respiratory syncytial virus pneumonia) 02/15/2022   COPD with acute exacerbation (HCC) 02/10/2022   Hypokalemia 02/10/2022   Falls frequently 02/10/2022   Hypoglycemia 01/11/2022   AKI (acute kidney injury) (HCC) 01/10/2022   Pressure injury of skin 01/09/2022   COPD exacerbation (HCC) 01/08/2022   Prediabetes 01/08/2022   Dementia with behavioral disturbance (HCC) 11/15/2021   Hypoxia 11/15/2021   COVID-19 virus infection 11/14/2021   Right rib fracture 11/09/2020   Fall at home, initial encounter 11/09/2020   COPD (chronic obstructive pulmonary disease) (HCC) 11/09/2020   Depression with anxiety 11/09/2020   Elevated troponin 11/09/2020   Acute respiratory failure with hypoxia (HCC) 11/09/2020   HTN (hypertension) 11/09/2020   Abrasions of multiple sites    Pure hypercholesterolemia 06/09/2018   Recurrent major depressive disorder, 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   HTN, goal below 140/80 04/28/2018   CKD (chronic kidney disease) stage 3, GFR 30-59 ml/min (HCC) 04/28/2018    Orientation RESPIRATION BLADDER Height & Weight     Time, Self, Situation,  Place  Normal Continent Weight: 150 lb (68 kg) Height:  5\' 2"  (157.5 cm)  BEHAVIORAL SYMPTOMS/MOOD NEUROLOGICAL BOWEL NUTRITION STATUS      Continent Diet (low sodium heart healthy)  AMBULATORY STATUS COMMUNICATION OF NEEDS Skin   Supervision Verbally  (open wound on right arm)                       Personal Care Assistance Level of Assistance  Dressing, Feeding, Bathing Bathing Assistance: Limited assistance Feeding assistance: Limited assistance Dressing Assistance: Limited assistance     Functional Limitations Info  Sight, Hearing, Speech Sight Info: Adequate Hearing Info: Adequate Speech Info: Adequate    SPECIAL CARE FACTORS FREQUENCY  PT (By licensed PT), OT (By licensed OT)     PT Frequency: home health with [facility] OT Frequency: home health with [facility]            Contractures Contractures Info: Not present    Additional Factors Info  Code Status, Allergies Code Status Info: DNR Allergies Info: Morphine And Related, Latex            Discharge Medications: Acetaminophen Extra Strength 500 MG Tabs Take 1 tablet by mouth every 4 (four) hours as needed (pain).    Advair Diskus 250-50 MCG/ACT Aepb Generic drug: fluticasone-salmeterol Inhale 1 puff into the lungs 2 (two) times daily.    albuterol 108 (90 Base) MCG/ACT inhaler Commonly known as: VENTOLIN HFA Inhale 2 puffs into the lungs every 6 (six) hours as needed for wheezing or shortness of breath.    amLODipine 10 MG tablet Commonly known as: NORVASC Take 1  tablet (10 mg total) by mouth daily.    aspirin EC 81 MG tablet Take 81 mg by mouth daily.    bisoprolol 5 MG tablet Commonly known as: ZEBETA Take 1 tablet (5 mg total) by mouth daily.    buPROPion 150 MG 24 hr tablet Commonly known as: WELLBUTRIN XL Take 150 mg by mouth daily.    clonazePAM 1 MG tablet Commonly known as: KLONOPIN Take 1 tablet (1 mg total) by mouth 2 (two) times daily.    DULoxetine 60 MG capsule Commonly  known as: CYMBALTA Take 60 mg by mouth daily.    gabapentin 100 MG capsule Commonly known as: NEURONTIN Take 100 mg by mouth 2 (two) times daily.    haloperidol 2 MG tablet Commonly known as: HALDOL Take 1 tablet (2 mg total) by mouth every 6 (six) hours as needed for agitation.    ipratropium-albuterol 0.5-2.5 (3) MG/3ML Soln Commonly known as: DUONEB Take 3 mLs by nebulization in the morning, at noon, and at bedtime.    losartan-hydrochlorothiazide 100-12.5 MG tablet Commonly known as: HYZAAR Take 1 tablet by mouth daily.    OXcarbazepine 150 MG tablet Commonly known as: TRILEPTAL Take 150-300 mg by mouth See admin instructions. Take 150 mg (1 tablet) by mouth every morning, and 300 mg (2 tablets) at bedtime.    polyethylene glycol 17 g packet Commonly known as: MIRALAX / GLYCOLAX Take 17 g by mouth daily.    QUEtiapine 50 MG tablet Commonly known as: SEROQUEL Take 1 tablet (50 mg total) by mouth at bedtime.    rivastigmine 1.5 MG capsule Commonly known as: EXELON Take 1.5 mg by mouth 2 (two) times daily.      Relevant Imaging Results:  Relevant Lab Results:   Additional Information [id number]  Darolyn Rua, LCSW

## 2022-02-19 NOTE — Care Management Important Message (Signed)
Important Message  Patient Details  Name: Mylene Bow MRN: [id number] Date of Birth: 04/12/1940   Medicare Important Message Given:  Other (see comment)  Called the patient's room to review the Important Message from Medicare but chart shows Altered Mental Status and I do not see a HCPOA listed.    cKathy A Elder Davidian 02/19/2022, 1:32 PM

## 2022-02-19 NOTE — Progress Notes (Signed)
Physical Therapy Treatment Patient Details Name: Samantha Carpenter MRN: [id number] DOB: Feb 12, 1941 Today's Date: 02/19/2022   History of Present Illness Pt is an 80 y.o. female presenting to hospital 02/10/22 with c/o SOB and 2 falls in past 24 hours.  Pt admitted with acute COPD exacerbation, acute respiratory failure with hypoxia, frequent falls, hypokalemia, and AKI.  (+) RSV.  PMH includes COPD, emphysema, advanced dementia, CKD.    PT Comments    Pt walking around room upon PT arrival; pt's nurse present for safety.  Pt appearing very anxious and focused on getting to the bank to write "50 checks" she needed for the holiday season.  No loss of balance noted with dynamic standing activities but requiring SBA to CGA for safety d/t quick impulsive movements in room.  Pt difficult to redirect limiting therapy session.  Pt's O2 sats 88% on room air (pt would not wear her nasal cannula--nurse already aware).  Pt standing at nursing station with pt's nurse present with pt end of session.   Recommendations for follow up therapy are one component of a multi-disciplinary discharge planning process, led by the attending physician.  Recommendations may be updated based on patient status, additional functional criteria and insurance authorization.  Follow Up Recommendations  Home health PT     Assistance Recommended at Discharge Intermittent Supervision/Assistance  Patient can return home with the following A little help with walking and/or transfers;A little help with bathing/dressing/bathroom;Assistance with cooking/housework;Assist for transportation;Help with stairs or ramp for entrance   Equipment Recommendations  None recommended by PT    Recommendations for Other Services       Precautions / Restrictions Precautions Precautions: Fall Restrictions Weight Bearing Restrictions: No     Mobility  Bed Mobility               General bed mobility comments: Deferred (pt up walking in  room upon PT arrival)    Transfers Overall transfer level: Needs assistance Equipment used: None Transfers: Sit to/from Stand Sit to Stand: Supervision           General transfer comment: SBA for safety    Ambulation/Gait Ambulation/Gait assistance: Supervision, Min guard Gait Distance (Feet): 100 Feet Assistive device: None Gait Pattern/deviations: Step-through pattern       General Gait Details: pt occasionally putting hands on furniture on room but pt overall steady and safe   Stairs             Wheelchair Mobility    Modified Rankin (Stroke Patients Only)       Balance Overall balance assessment: Needs assistance         Standing balance support: No upper extremity supported Standing balance-Leahy Scale: Good Standing balance comment: no loss of balance noted but requiring SBA to CGA for safety d/t quick movements in room (pt appearing very anxious)                            Cognition Arousal/Alertness: Awake/alert Behavior During Therapy: Anxious Overall Cognitive Status: No family/caregiver present to determine baseline cognitive functioning                                 General Comments: Oriented to at least person and place        Exercises      General Comments        Pertinent Vitals/Pain Pain Assessment Pain Assessment:  No/denies pain    Home Living                          Prior Function            PT Goals (current goals can now be found in the care plan section) Acute Rehab PT Goals Patient Stated Goal: to improve breathing PT Goal Formulation: With patient Time For Goal Achievement: 03/03/22 Potential to Achieve Goals: Fair Progress towards PT goals: Progressing toward goals    Frequency    Min 2X/week      PT Plan Current plan remains appropriate    Co-evaluation              AM-PAC PT "6 Clicks" Mobility   Outcome Measure  Help needed turning from your  back to your side while in a flat bed without using bedrails?: None Help needed moving from lying on your back to sitting on the side of a flat bed without using bedrails?: None Help needed moving to and from a bed to a chair (including a wheelchair)?: A Little Help needed standing up from a chair using your arms (e.g., wheelchair or bedside chair)?: A Little Help needed to walk in hospital room?: A Little Help needed climbing 3-5 steps with a railing? : A Little 6 Click Score: 20    End of Session   Activity Tolerance: Patient tolerated treatment well Patient left:  (standing at nursing station with nurse present) Nurse Communication: Mobility status;Precautions PT Visit Diagnosis: Muscle weakness (generalized) (M62.81);History of falling (Z91.81);Other abnormalities of gait and mobility (R26.89)     Time: 4827-0786 PT Time Calculation (min) (ACUTE ONLY): 15 min  Charges:  $Therapeutic Activity: 8-22 mins                     Hendricks Limes, PT 02/19/22, 1:17 PM

## 2022-02-19 NOTE — Discharge Summary (Signed)
Physician Discharge Summary   Patient: Samantha LatinoBarbara Carpenter MRN: [id number] DOB: 07/31/1940  Admit date:     02/10/2022  Discharge date: 02/19/22  Discharge Physician: Delfino LovettVipul Lasheena Frieze   PCP: Pcp, No   Recommendations at discharge:   Follow-up with outpatient providers as requested  Discharge Diagnoses: Principal Problem:   COPD with acute exacerbation (HCC) Active Problems:   Acute respiratory failure with hypoxia (HCC)   Dementia with behavioral disturbance (HCC)   Panlobular emphysema (HCC)   Depression with anxiety   HTN (hypertension)   AKI (acute kidney injury) (HCC)   Hypokalemia   Falls frequently   Hypomagnesemia   RSV (respiratory syncytial virus pneumonia)  Resolved Problems:   * No resolved hospital problems. [facility]*  Hospital Course: This 81 years old female with PMH significant for COPD, hypertension, stage IIIa CKD, dementia, dyslipidemia presented the ED for the evaluation after she was found with her head down in the dining hall and appeared to be more short of breath when compared to her baseline. Patient had a fall the night prior to her admission and hit her head. When EMS arrived patient was found to have audible wheezes and received Solu-Medrol 125 mg IV and 2 back-to-back nebulizer treatment prior to being transported in the ER. Patient was also found to be having altered mental status as compared to baseline. She was placed on BiPAP for increased work of breathing. Patient was admitted for further evaluation.  Patient continues to have significant bronchospasm, steroid changed to IV.  Patient was very combative, not able to sleep at night.  Seroquel was given.  12/3: DC tele  Assessment and Plan: * COPD with acute exacerbation (HCC) Patient with a known history of COPD who presents to the ER for evaluation of worsening shortness of breath from her baseline with increased work of breathing initially requiring BiPAP.  Now on 2 L oxygen.  Now on room air  continue home  inhalers  Acute respiratory failure with hypoxia (HCC) Secondary to acute COPD exacerbation Patient presented for evaluation of worsening shortness of breath from her baseline and had increased work of breathing initially requiring BiPAP She is weaned off BiPAP.  Now she is on room air  Dementia with behavioral disturbance (HCC) Continue Exelon and as needed Haldol.  RSV (respiratory syncytial virus pneumonia) Respi Panel + for RSV. Continue symptomatic mgmt.  Hypomagnesemia Replaced  Falls frequently Patient has had 2 falls before admission Patient noted to have a left frontal scalp contusion with small hematoma measuring 5 mm in thickness from her recent fall Fall precautions  Hypokalemia Secondary to diuretic therapy Repleted and resolved  AKI (acute kidney injury) [facility](HCC) Patient has a baseline serum creatinine of 1.23 and on admission it is 2.0 AKI appears to be prerenal and related to diuretic use Held hydrochlorothiazide and losartan initially.  Resolved with hydration  Lab Results  Component Value Date   CREATININE 0.98 02/17/2022   CREATININE 0.88 02/16/2022   CREATININE 1.14 (H) 02/15/2022     HTN (hypertension) Resume home medicines  Depression with anxiety Continue bupropion and Klonopin.  Panlobular emphysema (HCC) Patient with known history of COPD Outpatient follow-up with pulmonary         Consultants: Pulmonary Disposition: Assisted living Diet recommendation:  Discharge Diet Orders (From admission, onward)     Start     Ordered   02/19/22 0000  Diet - low sodium heart healthy        02/19/22 1234  Carb modified diet DISCHARGE MEDICATION: Allergies as of 02/19/2022       Reactions   Morphine And Related    Latex Rash   "I break out"        Medication List     TAKE these medications    Acetaminophen Extra Strength 500 MG Tabs Take 1 tablet by mouth every 4 (four) hours as needed (pain).   Advair Diskus  250-50 MCG/ACT Aepb Generic drug: fluticasone-salmeterol Inhale 1 puff into the lungs 2 (two) times daily.   albuterol 108 (90 Base) MCG/ACT inhaler Commonly known as: VENTOLIN HFA Inhale 2 puffs into the lungs every 6 (six) hours as needed for wheezing or shortness of breath.   amLODipine 10 MG tablet Commonly known as: NORVASC Take 1 tablet (10 mg total) by mouth daily.   aspirin EC 81 MG tablet Take 81 mg by mouth daily.   bisoprolol 5 MG tablet Commonly known as: ZEBETA Take 1 tablet (5 mg total) by mouth daily.   buPROPion 150 MG 24 hr tablet Commonly known as: WELLBUTRIN XL Take 150 mg by mouth daily.   clonazePAM 1 MG tablet Commonly known as: KLONOPIN Take 1 tablet (1 mg total) by mouth 2 (two) times daily.   DULoxetine 60 MG capsule Commonly known as: CYMBALTA Take 60 mg by mouth daily.   gabapentin 100 MG capsule Commonly known as: NEURONTIN Take 100 mg by mouth 2 (two) times daily.   haloperidol 2 MG tablet Commonly known as: HALDOL Take 1 tablet (2 mg total) by mouth every 6 (six) hours as needed for agitation.   ipratropium-albuterol 0.5-2.5 (3) MG/3ML Soln Commonly known as: DUONEB Take 3 mLs by nebulization in the morning, at noon, and at bedtime.   losartan-hydrochlorothiazide 100-12.5 MG tablet Commonly known as: HYZAAR Take 1 tablet by mouth daily.   OXcarbazepine 150 MG tablet Commonly known as: TRILEPTAL Take 150-300 mg by mouth See admin instructions. Take 150 mg (1 tablet) by mouth every morning, and 300 mg (2 tablets) at bedtime.   polyethylene glycol 17 g packet Commonly known as: MIRALAX / GLYCOLAX Take 17 g by mouth daily.   QUEtiapine 50 MG tablet Commonly known as: SEROQUEL Take 1 tablet (50 mg total) by mouth at bedtime.   rivastigmine 1.5 MG capsule Commonly known as: EXELON Take 1.5 mg by mouth 2 (two) times daily.        Discharge Exam: Filed Weights   02/10/22 1617 02/11/22 2029  Weight: 68 kg 68 kg    Constitutional: NAD, alert, oriented to person and place HEENT: conjunctivae and lids normal, EOMI CV: No cyanosis.   RESP: normal respiratory effort Neuro: Alert and awake, nonfocal  Condition at discharge: fair  The results of significant diagnostics from this hospitalization (including imaging, microbiology, ancillary and laboratory) are listed below for reference.   Imaging Studies: CT HEAD WO CONTRAST ( )  Result Date: 02/10/2022 CLINICAL DATA:  Status post fall. Patient was found minimally responsive this morning EXAM: CT HEAD WITHOUT CONTRAST TECHNIQUE: Contiguous axial images were obtained from the base of the skull through the vertex without intravenous contrast. RADIATION DOSE REDUCTION: This exam was performed according to the departmental dose-optimization program which includes automated exposure control, adjustment of the mA and/or kV according to patient size and/or use of iterative reconstruction technique. COMPARISON:  01/08/2022 FINDINGS: Brain: No evidence of acute infarction, hemorrhage, hydrocephalus, extra-axial collection or mass lesion/mass effect. There is mild diffuse low-attenuation within the subcortical and periventricular white matter compatible with chronic  microvascular disease. Prominence of the sulci and ventricles compatible with brain atrophy. Vascular: No hyperdense vessel or unexpected calcification. Skull: Normal. Negative for fracture or focal lesion. Sinuses/Orbits: Mucosal thickening identified within the maxillary sinuses, there is mild pan sinus mucosal thickening. Mastoid air cells are clear. Other: Signs of left frontal scalp contusion with small hematoma measuring 5 mm in thickness. IMPRESSION: 1. No acute intracranial abnormalities. 2. Chronic microvascular disease and brain atrophy. 3. Left frontal scalp contusion with small hematoma measuring 5 mm in thickness. 4. Mild sinus inflammation. Electronically Signed   By: Signa Kell M.D.   On:  02/10/2022 14:10   DG Chest Portable 1 View  Result Date: 02/10/2022 CLINICAL DATA:  81 year old female found down.  Recent fall. EXAM: PORTABLE CHEST 1 VIEW COMPARISON:  Portable chest 01/08/2022 and earlier. FINDINGS: Portable AP upright view at 1232 hours. Lung volumes and mediastinal contours remain within normal limits. Visualized tracheal air column is within normal limits. Allowing for portable technique the lungs are clear. No pneumothorax or pleural effusion. No acute osseous abnormality identified. Paucity bowel gas in the visible abdomen. IMPRESSION: No acute cardiopulmonary abnormality or acute traumatic injury identified. Electronically Signed   By: Odessa Fleming M.D.   On: 02/10/2022 12:46    Microbiology: Results for orders placed or performed during the hospital encounter of 02/10/22  Resp Panel by RT-PCR (Flu A&B, Covid) Anterior Nasal Swab     Status: None   Collection Time: 02/10/22  2:13 PM   Specimen: Anterior Nasal Swab  Result Value Ref Range Status   SARS Coronavirus 2 by RT PCR NEGATIVE NEGATIVE Final    Comment: (NOTE) SARS-CoV-2 target nucleic acids are NOT DETECTED.  The SARS-CoV-2 RNA is generally detectable in upper respiratory specimens during the acute phase of infection. The lowest concentration of SARS-CoV-2 viral copies this assay can detect is 138 copies/mL. A negative result does not preclude SARS-Cov-2 infection and should not be used as the sole basis for treatment or other patient management decisions. A negative result may occur with  improper specimen collection/handling, submission of specimen other than nasopharyngeal swab, presence of viral mutation(s) within the areas targeted by this assay, and inadequate number of viral copies(<138 copies/mL). A negative result must be combined with clinical observations, patient history, and epidemiological information. The expected result is Negative.  Fact Sheet for Patients:   BloggerCourse.com  Fact Sheet for Healthcare Providers:  SeriousBroker.it  This test is no t yet approved or cleared by the [place] FDA and  has been authorized for detection and/or diagnosis of SARS-CoV-2 by FDA under an Emergency Use Authorization (EUA). This EUA will remain  in effect (meaning this test can be used) for the duration of the COVID-19 declaration under Section 564(b)(1) of the Act, 21 U.S.C.section 360bbb-3(b)(1), unless the authorization is terminated  or revoked sooner.       Influenza A by PCR NEGATIVE NEGATIVE Final   Influenza B by PCR NEGATIVE NEGATIVE Final    Comment: (NOTE) The Xpert Xpress SARS-CoV-2/FLU/RSV plus assay is intended as an aid in the diagnosis of influenza from Nasopharyngeal swab specimens and should not be used as a sole basis for treatment. Nasal washings and aspirates are unacceptable for Xpert Xpress SARS-CoV-2/FLU/RSV testing.  Fact Sheet for Patients: BloggerCourse.com  Fact Sheet for Healthcare Providers: SeriousBroker.it  This test is not yet approved or cleared by the [place] FDA and has been authorized for detection and/or diagnosis of SARS-CoV-2 by FDA under an Emergency Use Authorization (  EUA). This EUA will remain in effect (meaning this test can be used) for the duration of the COVID-19 declaration under Section 564(b)(1) of the Act, 21 U.S.C. section 360bbb-3(b)(1), unless the authorization is terminated or revoked.  Performed at [facility], [address] [place], [place] [address]   Respiratory (~20 pathogens) panel by PCR     Status: Abnormal   Collection Time: 02/14/22  9:05 AM   Specimen: Nasopharyngeal Swab; Respiratory  Result Value Ref Range Status   Adenovirus NOT DETECTED NOT DETECTED Final   Coronavirus 229E NOT DETECTED NOT DETECTED Final    Comment: (NOTE) The Coronavirus on  the Respiratory Panel, DOES NOT test for the novel  Coronavirus (2019 nCoV)    Coronavirus HKU1 NOT DETECTED NOT DETECTED Final   Coronavirus NL63 NOT DETECTED NOT DETECTED Final   Coronavirus OC43 NOT DETECTED NOT DETECTED Final   Metapneumovirus NOT DETECTED NOT DETECTED Final   Rhinovirus / Enterovirus NOT DETECTED NOT DETECTED Final   Influenza A NOT DETECTED NOT DETECTED Final   Influenza B NOT DETECTED NOT DETECTED Final   Parainfluenza Virus 1 NOT DETECTED NOT DETECTED Final   Parainfluenza Virus 2 NOT DETECTED NOT DETECTED Final   Parainfluenza Virus 3 NOT DETECTED NOT DETECTED Final   Parainfluenza Virus 4 NOT DETECTED NOT DETECTED Final   Respiratory Syncytial Virus DETECTED (A) NOT DETECTED Final   Bordetella pertussis NOT DETECTED NOT DETECTED Final   Bordetella Parapertussis NOT DETECTED NOT DETECTED Final   Chlamydophila pneumoniae NOT DETECTED NOT DETECTED Final   Mycoplasma pneumoniae NOT DETECTED NOT DETECTED Final    Comment: Performed at [facility], [address], [place], [place] [address]    Labs: CBC: Recent Labs  Lab 02/14/22 0634 02/15/22 0411  WBC 10.5 8.3  HGB 12.1 10.6*  HCT 37.3 32.5*  MCV 94.7 94.5  PLT 315 295   Basic Metabolic Panel: Recent Labs  Lab 02/14/22 0634 02/15/22 0411 02/16/22 0543 02/17/22 0723  NA 140 140 139  --   K 3.8 3.4* 4.3  --   CL 99 100 99  --   CO2 31 32 33*  --   GLUCOSE 105* 107* 137*  --   BUN 12 20 17   --   CREATININE 0.85 1.14* 0.88 0.98  CALCIUM 9.3 8.9 8.9  --   MG 1.6* 1.4* 2.4  --   PHOS 2.6 2.3* 3.1  --    Liver Function Tests: No results for input(s): "AST", "ALT", "ALKPHOS", "BILITOT", "PROT", "ALBUMIN" in the last 168 hours. CBG: Recent Labs  Lab 02/12/22 1332  GLUCAP 92    Discharge time spent: greater than 30 minutes.  Signed: 02/14/22, MD [facility] 02/19/2022

## 2022-02-19 NOTE — TOC Transition Note (Addendum)
Transition of Care [facility]) - CM/SW Discharge Note   Patient Details  Name: Samantha Carpenter MRN: [id number] Date of Birth: [birth year]  Transition of Care [facility]) CM/SW Contact:  Darolyn Rua, LCSW Phone Number: 02/19/2022, 12:24 PM   Clinical Narrative:     Patient will discharge to [facility] with [facility]. DC summary and fl2 to be sent to [phone]. RN to call report to [phone] ask for memory care.   EMS transport to be arranged once dc summary and fl2 faxed to facility.   Darolyn Rua, [place], MSW, [place] [phone]   Final next level of care: Memory Care Barriers to Discharge: No Barriers Identified   Patient Goals and CMS Choice   CMS Medicare.gov Compare Post Acute Care list provided to:: Patient Represenative (must comment) (sister Carney Bern)    Discharge Placement                       Discharge Plan and Services                          HH Arranged: RN, PT, OT [facility] Agency: [facility] Date [facility] Agency Contacted: 02/12/22   Representative spoke with at [facility] Agency: Evorn Gong  Social Determinants of Health (SDOH) Interventions Transportation Interventions: Intervention Not Indicated   Readmission Risk Interventions    01/11/2022    2:05 PM 01/09/2022    2:23 PM  Readmission Risk Prevention Plan  Transportation Screening  Complete  HRI or Home Care Consult Complete   Palliative Care Screening  Not Applicable  Medication Review (RN Care Manager)  Complete

## 2022-02-26 LAB — BLOOD GAS, VENOUS
Acid-Base Excess: 7.4 mmol/L — ABNORMAL HIGH (ref 0.0–2.0)
Bicarbonate: 35.6 mmol/L — ABNORMAL HIGH (ref 20.0–28.0)
O2 Saturation: 40.7 %
Patient temperature: 37
pCO2, Ven: 66 mmHg — ABNORMAL HIGH (ref 44–60)
pH, Ven: 7.34 (ref 7.25–7.43)

## 2022-03-08 ENCOUNTER — Encounter: Payer: Self-pay | Admitting: Emergency Medicine

## 2022-03-08 ENCOUNTER — Emergency Department: Payer: Medicare HMO

## 2022-03-08 ENCOUNTER — Emergency Department
Admission: EM | Admit: 2022-03-08 | Discharge: 2022-03-08 | Disposition: A | Discharge status: Skilled Nursing Facility | Payer: Medicare HMO | Attending: Emergency Medicine | Admitting: Emergency Medicine

## 2022-03-08 ENCOUNTER — Other Ambulatory Visit: Payer: Self-pay

## 2022-03-08 DIAGNOSIS — Z8616 Personal history of COVID-19: Secondary | ICD-10-CM | POA: Diagnosis not present

## 2022-03-08 DIAGNOSIS — W19XXXA Unspecified fall, initial encounter: Secondary | ICD-10-CM

## 2022-03-08 DIAGNOSIS — I129 Hypertensive chronic kidney disease with stage 1 through stage 4 chronic kidney disease, or unspecified chronic kidney disease: Secondary | ICD-10-CM | POA: Diagnosis not present

## 2022-03-08 DIAGNOSIS — N183 Chronic kidney disease, stage 3 unspecified: Secondary | ICD-10-CM | POA: Insufficient documentation

## 2022-03-08 DIAGNOSIS — F0390 Unspecified dementia without behavioral disturbance: Secondary | ICD-10-CM | POA: Diagnosis present

## 2022-03-08 DIAGNOSIS — J449 Chronic obstructive pulmonary disease, unspecified: Secondary | ICD-10-CM | POA: Insufficient documentation

## 2022-03-08 MED ORDER — ACETAMINOPHEN 500 MG PO TABS
1000.0000 mg | ORAL_TABLET | Freq: Once | ORAL | Status: AC
  Administered 2022-03-08: 1000 mg via ORAL
  Filled 2022-03-08: qty 2

## 2022-03-08 NOTE — ED Notes (Signed)
D/C information packet provided to [facility] transport.

## 2022-03-08 NOTE — ED Provider Notes (Addendum)
[facility] Provider Note    Event Date/Time   First MD Initiated Contact with Patient 03/08/22 1457     (approximate)   History   Fall   HPI  Samantha Carpenter is a 81 y.o. female past medical history of COPD, depression, hypertension, CKD who presents after a fall.  Patient tells me she was going to sit down when the chair slipped and she fell onto her buttocks.  Did hit her head.  Complains of headache and some left arm pain.  Unclear if she lost consciousness.  Also complaining of some bilateral upper back pain.  Denies shortness of breath.  Has been able to ambulate since.  No hip pain     Past Medical History:  Diagnosis Date   Chronic kidney disease    COPD (chronic obstructive pulmonary disease) (HCC)    Dementia (HCC)    Depression    Hypertension    Liver disease     Patient Active Problem List   Diagnosis Date Noted   Hypomagnesemia 02/15/2022   RSV (respiratory syncytial virus pneumonia) 02/15/2022   COPD with acute exacerbation (HCC) 02/10/2022   Hypokalemia 02/10/2022   Falls frequently 02/10/2022   Hypoglycemia 01/11/2022   AKI (acute kidney injury) (HCC) 01/10/2022   Pressure injury of skin 01/09/2022   COPD exacerbation (HCC) 01/08/2022   Prediabetes 01/08/2022   Dementia with behavioral disturbance (HCC) 11/15/2021   Hypoxia 11/15/2021   COVID-19 virus infection 11/14/2021   Right rib fracture 11/09/2020   Fall at home, initial encounter 11/09/2020   COPD (chronic obstructive pulmonary disease) (HCC) 11/09/2020   Depression with anxiety 11/09/2020   Elevated troponin 11/09/2020   Acute respiratory failure with hypoxia (HCC) 11/09/2020   HTN (hypertension) 11/09/2020   Abrasions of multiple sites    Pure hypercholesterolemia 06/09/2018   Recurrent major depressive disorder, 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   HTN, goal below 140/80 04/28/2018   CKD (chronic kidney disease) stage  3, GFR 30-59 ml/min (HCC) 04/28/2018     Physical Exam  Triage Vital Signs: ED Triage Vitals  Enc Vitals Group     BP 03/08/22 1212 (!) 169/79     Pulse Rate 03/08/22 1212 (!) 59     Resp 03/08/22 1212 18     Temp 03/08/22 1212 97.9 F (36.6 C)     Temp Source 03/08/22 1212 Oral     SpO2 03/08/22 1212 94 %     Weight 03/08/22 1557 150 lb (68 kg)     Height 03/08/22 1557 5\' 2"  (1.575 m)     Head Circumference --      Peak Flow --      Pain Score 03/08/22 1211 0     Pain Loc --      Pain Edu? --      Excl. in GC? --     Most recent vital signs: Vitals:   03/08/22 1212 03/08/22 1659  BP: (!) 169/79   Pulse: (!) 59   Resp: 18   Temp: 97.9 F (36.6 C)   SpO2: 94% 96%     General: Awake, no distress.  CV:  Good peripheral perfusion.  Resp:  Normal effort.  Lungs are clear Abd:  No distention.  Neuro:             Awake, Alert, Oriented x 2 Moves all extremities symmetrically Other:  PERRLA, EOMI,  No signs of facial trauma No midline C-spine tenderness No  midline thoracic or lumbar spine tenderness Able to range both hips bilaterally Pelvis stable nontender No swelling bony tenderness or bruising of the left arm able to range the wrist and elbow normally   ED Results / Procedures / Treatments  Labs (all labs ordered are listed, but only abnormal results are displayed) Labs Reviewed - No data to display   EKG     RADIOLOGY I reviewed and interpreted the CT scan of the brain which does not show any acute intracranial process    PROCEDURES:  Critical Care performed: No  Procedures   MEDICATIONS ORDERED IN ED: Medications  acetaminophen (TYLENOL) tablet 1,000 mg (1,000 mg Oral Given 03/08/22 1558)     IMPRESSION / MDM / ASSESSMENT AND PLAN / ED COURSE  I reviewed the triage vital signs and the nursing notes.                              Patient's presentation is most consistent with acute presentation with potential threat to life or bodily  function.  Differential diagnosis includes, but is not limited to, intracranial hemorrhage, rib fracture, vertebral fracture  Patient is a 81 year old female with underlying dementia presents after fall.  Says that she went to sit down on a chair but it slipped out from under her and she fell to the ground hitting her head.  Unclear if she lost consciousness.  Patient is somewhat confused but this is her baseline.  She has no focal neurologic deficits.  No obvious signs of trauma.  She has equal breath sounds bilaterally is complaining of some bilateral upper back pain so obtained a chest x-ray which not show any rib fracture.  She really has no midline CT or L-spine tenderness do not feel that she required imaging of the spine.  Did obtain CT head and C-spine and this is negative for acute injury.  Patient was ambulating around the department without issue.  Has no focal tenderness of the pelvis and is able to range both hips.  She is appropriate for discharge.     FINAL CLINICAL IMPRESSION(S) / ED DIAGNOSES   Final diagnoses:  Fall, initial encounter     Rx / DC Orders   ED Discharge Orders     None        Note:  This document was prepared using Dragon voice recognition software and may include unintentional dictation errors.   Georga Hacking, MD 03/08/22 1725    Georga Hacking, MD 03/08/22 [phone]

## 2022-03-08 NOTE — ED Notes (Signed)
Pt describes some pain in her neck when trying to move head, pt states no pain to arms, elbow, or shoulder but does have some chronic pain in hips

## 2022-03-08 NOTE — Discharge Instructions (Signed)
Your CAT scan of your brain did not show any injuries.

## 2022-03-08 NOTE — ED Notes (Signed)
[facility] called for transport to facility/[facility] /rep:blanca

## 2022-03-08 NOTE — ED Notes (Signed)
Pt up to restroom, able to ambulate without assistance, pt steady on feet.

## 2022-03-08 NOTE — ED Triage Notes (Signed)
Please see First Nurse Note. 

## 2022-03-08 NOTE — ED Notes (Signed)
First Nurse Note: Patient to ED via [facility] from [facility] for a witnessed fall. Patient missed her chair while trying to sit down. Patient hit her left arm and back of head. Denies LOC or blood thinners. Hx of dementia.  175/59 58 HR 98%

## 2022-04-03 ENCOUNTER — Ambulatory Visit (INDEPENDENT_AMBULATORY_CARE_PROVIDER_SITE_OTHER): Payer: Medicare HMO | Admitting: Student in an Organized Health Care Education/Training Program

## 2022-04-03 ENCOUNTER — Encounter: Payer: Self-pay | Admitting: Student in an Organized Health Care Education/Training Program

## 2022-04-03 VITALS — BP 124/80 | HR 57 | Temp 97.6°F | Ht 62.0 in | Wt 157.2 lb

## 2022-04-03 DIAGNOSIS — Z23 Encounter for immunization: Secondary | ICD-10-CM

## 2022-04-03 DIAGNOSIS — J449 Chronic obstructive pulmonary disease, unspecified: Secondary | ICD-10-CM

## 2022-04-03 MED ORDER — FLUTICASONE-SALMETEROL 250-50 MCG/ACT IN AEPB: 1.0000 | INHALATION_SPRAY | Freq: Two times a day (BID) | RESPIRATORY_TRACT | 11 refills | Status: DC

## 2022-04-03 MED ORDER — SPIRIVA RESPIMAT 2.5 MCG/ACT IN AERS: 2.0000 | INHALATION_SPRAY | Freq: Every day | RESPIRATORY_TRACT | 11 refills | Status: DC
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
## 2022-04-16 ENCOUNTER — Emergency Department: Payer: Medicare HMO

## 2022-04-16 ENCOUNTER — Other Ambulatory Visit: Payer: Self-pay

## 2022-04-16 ENCOUNTER — Inpatient Hospital Stay
Admission: EM | Admit: 2022-04-16 | Discharge: 2022-04-23 | DRG: 291 | Disposition: A | Discharge status: Skilled Nursing Facility | Payer: Medicare HMO | Source: Skilled Nursing Facility | Attending: Student in an Organized Health Care Education/Training Program | Admitting: Student in an Organized Health Care Education/Training Program

## 2022-04-16 ENCOUNTER — Encounter: Payer: Self-pay | Admitting: Internal Medicine

## 2022-04-16 DIAGNOSIS — W1800XA Striking against unspecified object with subsequent fall, initial encounter: Secondary | ICD-10-CM | POA: Diagnosis present

## 2022-04-16 DIAGNOSIS — F03A11 Unspecified dementia, mild, with agitation: Secondary | ICD-10-CM | POA: Diagnosis present

## 2022-04-16 DIAGNOSIS — Z79899 Other long term (current) drug therapy: Secondary | ICD-10-CM

## 2022-04-16 DIAGNOSIS — F03A3 Unspecified dementia, mild, with mood disturbance: Secondary | ICD-10-CM | POA: Diagnosis present

## 2022-04-16 DIAGNOSIS — Y92129 Unspecified place in nursing home as the place of occurrence of the external cause: Secondary | ICD-10-CM

## 2022-04-16 DIAGNOSIS — F03918 Unspecified dementia, unspecified severity, with other behavioral disturbance: Secondary | ICD-10-CM | POA: Diagnosis not present

## 2022-04-16 DIAGNOSIS — Z1152 Encounter for screening for COVID-19: Secondary | ICD-10-CM

## 2022-04-16 DIAGNOSIS — E876 Hypokalemia: Secondary | ICD-10-CM | POA: Diagnosis present

## 2022-04-16 DIAGNOSIS — R001 Bradycardia, unspecified: Secondary | ICD-10-CM | POA: Diagnosis present

## 2022-04-16 DIAGNOSIS — Z66 Do not resuscitate: Secondary | ICD-10-CM | POA: Diagnosis present

## 2022-04-16 DIAGNOSIS — Z91040 Latex allergy status: Secondary | ICD-10-CM

## 2022-04-16 DIAGNOSIS — T68XXXA Hypothermia, initial encounter: Secondary | ICD-10-CM | POA: Diagnosis not present

## 2022-04-16 DIAGNOSIS — Z7951 Long term (current) use of inhaled steroids: Secondary | ICD-10-CM | POA: Diagnosis not present

## 2022-04-16 DIAGNOSIS — R680 Hypothermia, not associated with low environmental temperature: Secondary | ICD-10-CM | POA: Diagnosis present

## 2022-04-16 DIAGNOSIS — I10 Essential (primary) hypertension: Secondary | ICD-10-CM | POA: Diagnosis not present

## 2022-04-16 DIAGNOSIS — F03A18 Unspecified dementia, mild, with other behavioral disturbance: Secondary | ICD-10-CM | POA: Diagnosis present

## 2022-04-16 DIAGNOSIS — J449 Chronic obstructive pulmonary disease, unspecified: Secondary | ICD-10-CM | POA: Diagnosis present

## 2022-04-16 DIAGNOSIS — Z818 Family history of other mental and behavioral disorders: Secondary | ICD-10-CM | POA: Diagnosis not present

## 2022-04-16 DIAGNOSIS — R410 Disorientation, unspecified: Secondary | ICD-10-CM | POA: Diagnosis present

## 2022-04-16 DIAGNOSIS — I5033 Acute on chronic diastolic (congestive) heart failure: Secondary | ICD-10-CM | POA: Diagnosis not present

## 2022-04-16 DIAGNOSIS — E785 Hyperlipidemia, unspecified: Secondary | ICD-10-CM | POA: Diagnosis present

## 2022-04-16 DIAGNOSIS — E871 Hypo-osmolality and hyponatremia: Secondary | ICD-10-CM | POA: Diagnosis present

## 2022-04-16 DIAGNOSIS — I5032 Chronic diastolic (congestive) heart failure: Secondary | ICD-10-CM | POA: Diagnosis present

## 2022-04-16 DIAGNOSIS — Z87891 Personal history of nicotine dependence: Secondary | ICD-10-CM

## 2022-04-16 DIAGNOSIS — F32A Depression, unspecified: Secondary | ICD-10-CM | POA: Diagnosis present

## 2022-04-16 DIAGNOSIS — F03A4 Unspecified dementia, mild, with anxiety: Secondary | ICD-10-CM | POA: Diagnosis present

## 2022-04-16 DIAGNOSIS — N189 Chronic kidney disease, unspecified: Secondary | ICD-10-CM | POA: Diagnosis present

## 2022-04-16 DIAGNOSIS — Z885 Allergy status to narcotic agent status: Secondary | ICD-10-CM | POA: Diagnosis not present

## 2022-04-16 DIAGNOSIS — F418 Other specified anxiety disorders: Secondary | ICD-10-CM | POA: Diagnosis not present

## 2022-04-16 DIAGNOSIS — M7989 Other specified soft tissue disorders: Secondary | ICD-10-CM | POA: Diagnosis present

## 2022-04-16 DIAGNOSIS — I130 Hypertensive heart and chronic kidney disease with heart failure and stage 1 through stage 4 chronic kidney disease, or unspecified chronic kidney disease: Secondary | ICD-10-CM | POA: Diagnosis not present

## 2022-04-16 LAB — CBC
HCT: 33.2 % — ABNORMAL LOW (ref 36.0–46.0)
Hemoglobin: 11.1 g/dL — ABNORMAL LOW (ref 12.0–15.0)
MCH: 30.3 pg (ref 26.0–34.0)
MCHC: 33.4 g/dL (ref 30.0–36.0)
MCV: 90.7 fL (ref 80.0–100.0)
Platelets: 242 10*3/uL (ref 150–400)
RBC: 3.66 MIL/uL — ABNORMAL LOW (ref 3.87–5.11)
RDW: 13.1 % (ref 11.5–15.5)
WBC: 4.2 10*3/uL (ref 4.0–10.5)
nRBC: 0 % (ref 0.0–0.2)

## 2022-04-16 LAB — BASIC METABOLIC PANEL
Anion gap: 7 (ref 5–15)
Anion gap: 8 (ref 5–15)
BUN: 16 mg/dL (ref 8–23)
BUN: 15 mg/dL (ref 8–23)
CO2: 23 mmol/L (ref 22–32)
CO2: 28 mmol/L (ref 22–32)
Calcium: 6.9 mg/dL — ABNORMAL LOW (ref 8.9–10.3)
Calcium: 8.6 mg/dL — ABNORMAL LOW (ref 8.9–10.3)
Chloride: 96 mmol/L — ABNORMAL LOW (ref 98–111)
Chloride: 87 mmol/L — ABNORMAL LOW (ref 98–111)
Creatinine, Ser: 0.65 mg/dL (ref 0.44–1.00)
Creatinine, Ser: 0.77 mg/dL (ref 0.44–1.00)
GFR, Estimated: >60 mL/min (ref >60)
GFR, Estimated: >60 mL/min (ref >60)
Glucose, Bld: 80 mg/dL (ref 70–99)
Glucose, Bld: 90 mg/dL (ref 70–99)
Potassium: 2.7 mmol/L — CL (ref 3.5–5.1)
Potassium: 4.2 mmol/L (ref 3.5–5.1)
Sodium: 126 mmol/L — ABNORMAL LOW (ref 135–145)
Sodium: 123 mmol/L — ABNORMAL LOW (ref 135–145)

## 2022-04-16 LAB — COMPREHENSIVE METABOLIC PANEL
ALT: 11 U/L (ref 0–44)
AST: 22 U/L (ref 15–41)
Albumin: 3.7 g/dL (ref 3.5–5.0)
Alkaline Phosphatase: 64 U/L (ref 38–126)
Anion gap: 9 (ref 5–15)
BUN: 19 mg/dL (ref 8–23)
CO2: 29 mmol/L (ref 22–32)
Calcium: 8.8 mg/dL — ABNORMAL LOW (ref 8.9–10.3)
Chloride: 84 mmol/L — ABNORMAL LOW (ref 98–111)
Creatinine, Ser: 0.91 mg/dL (ref 0.44–1.00)
GFR, Estimated: >60 mL/min (ref >60)
Glucose, Bld: 120 mg/dL — ABNORMAL HIGH (ref 70–99)
Potassium: 3.5 mmol/L (ref 3.5–5.1)
Sodium: 122 mmol/L — ABNORMAL LOW (ref 135–145)
Total Bilirubin: 0.6 mg/dL (ref 0.3–1.2)
Total Protein: 6.3 g/dL — ABNORMAL LOW (ref 6.5–8.1)

## 2022-04-16 LAB — RESP PANEL BY RT-PCR (RSV, FLU A&B, COVID)  RVPGX2
Influenza A by PCR: NEGATIVE
Influenza B by PCR: NEGATIVE
Resp Syncytial Virus by PCR: NEGATIVE
SARS Coronavirus 2 by RT PCR: NEGATIVE

## 2022-04-16 LAB — TROPONIN I (HIGH SENSITIVITY): Troponin I (High Sensitivity): 8 ng/L (ref <18)

## 2022-04-16 LAB — OSMOLALITY: Osmolality: 266 mOsm/kg — ABNORMAL LOW (ref 275–295)

## 2022-04-16 LAB — PROCALCITONIN: Procalcitonin: <0.1 ng/mL

## 2022-04-16 LAB — SODIUM, URINE, RANDOM: Sodium, Ur: 81 mmol/L

## 2022-04-16 LAB — MAGNESIUM: Magnesium: 1.6 mg/dL — ABNORMAL LOW (ref 1.7–2.4)

## 2022-04-16 LAB — OSMOLALITY, URINE: Osmolality, Ur: 272 mOsm/kg — ABNORMAL LOW (ref 300–900)

## 2022-04-16 LAB — BRAIN NATRIURETIC PEPTIDE: B Natriuretic Peptide: 109.6 pg/mL — ABNORMAL HIGH (ref 0.0–100.0)

## 2022-04-16 LAB — PHOSPHORUS: Phosphorus: 3.7 mg/dL (ref 2.5–4.6)

## 2022-04-16 MED ORDER — SODIUM CHLORIDE 1 G PO TABS
1.0000 g | ORAL_TABLET | Freq: Two times a day (BID) | ORAL | Status: DC
  Administered 2022-04-17: 1 g via ORAL
  Filled 2022-04-16 (×3): qty 1

## 2022-04-16 MED ORDER — GABAPENTIN 100 MG PO CAPS
100.0000 mg | ORAL_CAPSULE | Freq: Two times a day (BID) | ORAL | Status: DC
  Administered 2022-04-16 – 2022-04-23 (×14): 100 mg via ORAL
  Filled 2022-04-16 (×14): qty 1

## 2022-04-16 MED ORDER — MOMETASONE FURO-FORMOTEROL FUM 200-5 MCG/ACT IN AERO
2.0000 | INHALATION_SPRAY | Freq: Two times a day (BID) | RESPIRATORY_TRACT | Status: DC
  Administered 2022-04-17 – 2022-04-23 (×11): 2 via RESPIRATORY_TRACT
  Filled 2022-04-16: qty 8.8

## 2022-04-16 MED ORDER — ASPIRIN 81 MG PO TBEC
81.0000 mg | DELAYED_RELEASE_TABLET | Freq: Every day | ORAL | Status: DC
  Administered 2022-04-17 – 2022-04-23 (×7): 81 mg via ORAL
  Filled 2022-04-16 (×7): qty 1

## 2022-04-16 MED ORDER — AMLODIPINE BESYLATE 10 MG PO TABS
10.0000 mg | ORAL_TABLET | Freq: Every day | ORAL | Status: DC
  Administered 2022-04-17 – 2022-04-20 (×4): 10 mg via ORAL
  Filled 2022-04-16 (×3): qty 1
  Filled 2022-04-16: qty 2

## 2022-04-16 MED ORDER — ALBUTEROL SULFATE (2.5 MG/3ML) 0.083% IN NEBU
2.5000 mg | INHALATION_SOLUTION | RESPIRATORY_TRACT | Status: DC | PRN
  Administered 2022-04-21: 2.5 mg via RESPIRATORY_TRACT
  Filled 2022-04-16: qty 3

## 2022-04-16 MED ORDER — POLYETHYLENE GLYCOL 3350 17 G PO PACK
17.0000 g | PACK | Freq: Every day | ORAL | Status: DC
  Administered 2022-04-18 – 2022-04-22 (×5): 17 g via ORAL
  Filled 2022-04-16 (×7): qty 1

## 2022-04-16 MED ORDER — DM-GUAIFENESIN ER 30-600 MG PO TB12
1.0000 | ORAL_TABLET | Freq: Two times a day (BID) | ORAL | Status: DC | PRN
  Administered 2022-04-20: 1 via ORAL
  Filled 2022-04-16 (×2): qty 1

## 2022-04-16 MED ORDER — TIOTROPIUM BROMIDE MONOHYDRATE 18 MCG IN CAPS
1.0000 | ORAL_CAPSULE | Freq: Every day | RESPIRATORY_TRACT | Status: DC
  Administered 2022-04-17 – 2022-04-22 (×6): 18 ug via RESPIRATORY_TRACT
  Filled 2022-04-16 (×2): qty 5

## 2022-04-16 MED ORDER — OXCARBAZEPINE 150 MG PO TABS
150.0000 mg | ORAL_TABLET | Freq: Every day | ORAL | Status: DC
  Administered 2022-04-17 – 2022-04-23 (×7): 150 mg via ORAL
  Filled 2022-04-16 (×7): qty 1

## 2022-04-16 MED ORDER — IPRATROPIUM-ALBUTEROL 0.5-2.5 (3) MG/3ML IN SOLN
3.0000 mL | Freq: Four times a day (QID) | RESPIRATORY_TRACT | Status: DC
  Administered 2022-04-16 – 2022-04-17 (×5): 3 mL via RESPIRATORY_TRACT
  Filled 2022-04-16 (×5): qty 3

## 2022-04-16 MED ORDER — HALOPERIDOL 2 MG PO TABS
2.0000 mg | ORAL_TABLET | Freq: Four times a day (QID) | ORAL | Status: DC | PRN
  Administered 2022-04-19 – 2022-04-22 (×4): 2 mg via ORAL
  Filled 2022-04-16 (×5): qty 1

## 2022-04-16 MED ORDER — CLONAZEPAM 1 MG PO TABS
1.0000 mg | ORAL_TABLET | Freq: Two times a day (BID) | ORAL | Status: DC
  Administered 2022-04-16 – 2022-04-19 (×7): 1 mg via ORAL
  Filled 2022-04-16: qty 1
  Filled 2022-04-16: qty 2
  Filled 2022-04-16: qty 1
  Filled 2022-04-16: qty 2
  Filled 2022-04-16 (×3): qty 1

## 2022-04-16 MED ORDER — ENOXAPARIN SODIUM 40 MG/0.4ML IJ SOSY
40.0000 mg | PREFILLED_SYRINGE | INTRAMUSCULAR | Status: DC
  Administered 2022-04-16 – 2022-04-22 (×7): 40 mg via SUBCUTANEOUS
  Filled 2022-04-16 (×7): qty 0.4

## 2022-04-16 MED ORDER — QUETIAPINE FUMARATE 25 MG PO TABS
50.0000 mg | ORAL_TABLET | Freq: Every day | ORAL | Status: DC
  Administered 2022-04-16 – 2022-04-19 (×4): 50 mg via ORAL
  Filled 2022-04-16 (×4): qty 2

## 2022-04-16 MED ORDER — OXCARBAZEPINE 150 MG PO TABS: 150.0000 mg | ORAL_TABLET | ORAL | Status: DC

## 2022-04-16 MED ORDER — RIVASTIGMINE TARTRATE 1.5 MG PO CAPS
1.5000 mg | ORAL_CAPSULE | Freq: Two times a day (BID) | ORAL | Status: DC
  Administered 2022-04-16 – 2022-04-23 (×14): 1.5 mg via ORAL
  Filled 2022-04-16 (×15): qty 1

## 2022-04-16 MED ORDER — BUPROPION HCL ER (XL) 150 MG PO TB24
150.0000 mg | ORAL_TABLET | Freq: Every day | ORAL | Status: DC
  Administered 2022-04-17 – 2022-04-23 (×7): 150 mg via ORAL
  Filled 2022-04-16 (×7): qty 1

## 2022-04-16 MED ORDER — SODIUM CHLORIDE 1 G PO TABS
1.0000 g | ORAL_TABLET | Freq: Two times a day (BID) | ORAL | Status: DC
  Filled 2022-04-16: qty 1

## 2022-04-16 MED ORDER — POTASSIUM CHLORIDE 10 MEQ/100ML IV SOLN
10.0000 meq | INTRAVENOUS | Status: AC
  Administered 2022-04-16 (×4): 10 meq via INTRAVENOUS
  Filled 2022-04-16 (×3): qty 100

## 2022-04-16 MED ORDER — OXCARBAZEPINE 300 MG PO TABS
300.0000 mg | ORAL_TABLET | Freq: Every day | ORAL | Status: DC
  Administered 2022-04-16 – 2022-04-22 (×7): 300 mg via ORAL
  Filled 2022-04-16 (×7): qty 1

## 2022-04-16 MED ORDER — DULOXETINE HCL 30 MG PO CPEP
60.0000 mg | ORAL_CAPSULE | Freq: Every day | ORAL | Status: DC
  Administered 2022-04-17 – 2022-04-23 (×7): 60 mg via ORAL
  Filled 2022-04-16 (×5): qty 2
  Filled 2022-04-16: qty 1
  Filled 2022-04-16: qty 2

## 2022-04-16 NOTE — ED Notes (Signed)
Critical lab, potassium 2.7, sent to provider [facility] via secure chat

## 2022-04-16 NOTE — ED Triage Notes (Signed)
Pt. To ED via EMS from [facility] for increased SOB, and HTN today, with bilat foot swelling x1 week. Pt. Received 5mg  haldol and 5mg  versed via EMS. Pt. Was mildly combative when being transferred because she did not want to go. Pt. Is baseline demented. EMS states pt. Is normally alert and oriented x2 at baseline.

## 2022-04-16 NOTE — ED Notes (Signed)
Patient passed swallow screen.

## 2022-04-16 NOTE — ED Provider Notes (Signed)
[facility] Provider Note    Event Date/Time   First MD Initiated Contact with Patient 04/16/22 1138     (approximate)   History   Shortness of breath lower extremity swelling   HPI  Samantha Carpenter is a 82 y.o. female with history of chronic kidney disease, COPD, dementia, hypertension who presents with possible increased work of breathing lower extremity swelling.  Patient has dementia and is poor historian.  Was reportedly given sedative prior to transportation which is a as needed order for her     Physical Exam   Triage Vital Signs: ED Triage Vitals  Enc Vitals Group     BP 04/16/22 1135 (!) 141/53     Pulse Rate 04/16/22 1135 (!) 49     Resp 04/16/22 1135 17     Temp 04/16/22 1137 (!) 96.9 F (36.1 C)     Temp Source 04/16/22 1137 Axillary     SpO2 04/16/22 1132 95 %     Weight --      Height --      Head Circumference --      Peak Flow --      Pain Score --      Pain Loc --      Pain Edu? --      Excl. in [place]? --     Most recent vital signs: Vitals:   04/16/22 1137 04/16/22 1150  BP: (!) 141/53   Pulse: (!) 51 (!) 47  Resp: 18 18  Temp: (!) 96.9 F (36.1 C)   SpO2: 93% 100%     General: Asleep, no distress CV:  Good peripheral perfusion.  Resp:  Normal effort.  Abd:  No distention.  Soft, nontender Other:  Mild edema bilaterally   ED Results / Procedures / Treatments   Labs (all labs ordered are listed, but only abnormal results are displayed) Labs Reviewed  CBC - Abnormal; Notable for the following components:      Result Value   RBC 3.66 (*)    Hemoglobin 11.1 (*)    HCT 33.2 (*)    All other components within normal limits  COMPREHENSIVE METABOLIC PANEL - Abnormal; Notable for the following components:   Sodium 122 (*)    Chloride 84 (*)    Glucose, Bld 120 (*)    Calcium 8.8 (*)    Total Protein 6.3 (*)    All other components within normal limits  BRAIN NATRIURETIC PEPTIDE - Abnormal; Notable for the  following components:   B Natriuretic Peptide 109.6 (*)    All other components within normal limits  RESP PANEL BY RT-PCR (RSV, FLU A&B, COVID)  [id number]  TROPONIN I (HIGH SENSITIVITY)     EKG  ED ECG REPORT I, Lavonia Drafts, the attending physician, personally viewed and interpreted this ECG.  Date: 04/16/2022  Rhythm: normal sinus rhythm QRS Axis: normal Intervals: Abnormal ST/T Wave abnormalities: normal Narrative Interpretation: no evidence of acute ischemia    RADIOLOGY Chest x-ray viewed interpret by me, no acute abnormality    PROCEDURES:  Critical Care performed: yes  CRITICAL CARE Performed by: Lavonia Drafts   Total critical care time: 30 minutes  Critical care time was exclusive of separately billable procedures and treating other patients.  Critical care was necessary to treat or prevent imminent or life-threatening deterioration.  Critical care was time spent personally by me on the following activities: development of treatment plan with patient and/or surrogate as well as nursing, discussions with  consultants, evaluation of patient's response to treatment, examination of patient, obtaining history from patient or surrogate, ordering and performing treatments and interventions, ordering and review of laboratory studies, ordering and review of radiographic studies, pulse oximetry and re-evaluation of patient's condition.   Procedures   MEDICATIONS ORDERED IN ED: Medications - No data to display   IMPRESSION / MDM / [place] / ED COURSE  I reviewed the triage vital signs and the nursing notes. Patient's presentation is most consistent with acute presentation with potential threat to life or bodily function.  Patient presents with reported increased work of breathing, lower extremity swelling.  Differential includes CHF exacerbation, electrolyte abnormalities, pneumonia  Patient is somewhat sedated here which led to nurse starting 2 L  nasal cannula oxygen to keep her saturations elevated.  Viral PCR is negative  White blood cell count is normal, no pneumonia on chest x-ray, lab work notable for sodium of 122 which is critically low  Will require admission for sodium correction  I have consulted the hospitalist        FINAL CLINICAL IMPRESSION(S) / ED DIAGNOSES   Final diagnoses:  Acute hyponatremia     Rx / DC Orders   ED Discharge Orders     None        Note:  This document was prepared using Dragon voice recognition software and may include unintentional dictation errors.   Lavonia Drafts, MD 04/16/22 1316

## 2022-04-16 NOTE — H&P (Signed)
History and Physical    Samantha Carpenter DOB: December 03, 1940 DOA: 04/16/2022  Referring MD/NP/PA:   PCP: Pcp, No   Patient coming from:  The patient is coming from SNF  Chief Complaint: SOB and leg edema  HPI: Samantha Carpenter is a 82 y.o. female with medical history significant of dCHF, hypertension, hyperlipidemia COPD, dementia, depression with anxiety, who presents with shortness breath and leg edema.  Pt has dementia, cannot provide accurate medical history.  I have tried to call her sister and niece both of them did not pick up the phone.  I could not leave message since mailbox is either not set up or full, therefore, most of the history is obtained by discussing the case with ED physician, per EMS report, and with the nursing staff.  Per report, pt was noted to have SOB and bilateral leg edema in facility. Pt was agitated and reportedly given Haldol and Versed prior to transportation. When I saw pt in ED, she is confused. She knows her own name, not orientated to the place and time.  Not sure about baseline mental status level.  Patient seems to have shortness breath, but no respiratory distress.  No active cough noted.  No active nausea, vomiting or diarrhea noted.  Not sure if patient has symptoms of UTI.  Does not seem to have pain anywhere, does not seem to have abdominal pain or chest pain.  She moves all extremities.  Data reviewed independently and ED Course: pt was found to have WBC 4.2, BMP 109, sodium 122, GFR> 60, temperature 96.6, blood pressure 141/53, heart rate 47-50, RR 18, oxygen saturation 93% on room air.  Chest x-ray showed interstitial opacity.  Patient is admitted to telemetry bed as inpatient    EKG: I have personally reviewed.  Sinus rhythm, QTc 469, heart rate 52, nonspecific T wave change.   Review of Systems: Could not review due to dementia.   Allergy:  Allergies  Allergen Reactions   Morphine And Related    Latex Rash    "I break out"     Past Medical History:  Diagnosis Date   Chronic kidney disease    COPD (chronic obstructive pulmonary disease) (HCC)    Dementia ([place])    Depression    Hypertension    Liver disease     Past Surgical History:  Procedure Laterality Date   ABDOMINAL HYSTERECTOMY     CHOLECYSTECTOMY     ESOPHAGOGASTRODUODENOSCOPY N/A 11/20/2019   Procedure: ESOPHAGOGASTRODUODENOSCOPY (EGD);  Surgeon: Lesly Rubenstein, MD;  Location: [facility] ENDOSCOPY;  Service: Gastroenterology;  Laterality: N/A;   face tuck      Social History:  reports that she quit smoking about 12 years ago. She has never used smokeless tobacco. She reports that she does not currently use alcohol. She reports that she does not currently use drugs.  Family History:  Family History  Problem Relation Age of Onset   Bipolar disorder Brother    Schizophrenia Brother    Bipolar disorder Sister    Schizophrenia Sister    Drug abuse Son      Prior to Admission medications   Medication Sig Start Date End Date Taking? Authorizing Provider  Acetaminophen Extra Strength 500 MG TABS Take 1 tablet by mouth every 4 (four) hours as needed (pain). 10/31/21   [provider]  ADVAIR DISKUS 250-50 MCG/ACT AEPB Inhale 1 puff into the lungs 2 (two) times daily. 10/23/21   [provider]  albuterol (VENTOLIN HFA) 108 (90  Base) MCG/ACT inhaler Inhale 2 puffs into the lungs every 6 (six) hours as needed for wheezing or shortness of breath. 05/05/20   [provider]  amLODipine (NORVASC) 10 MG tablet Take 1 tablet (10 mg total) by mouth daily. 11/17/21   Alford Highland, MD  aspirin EC 81 MG tablet Take 81 mg by mouth daily.    [provider]  bisoprolol (ZEBETA) 5 MG tablet Take 1 tablet (5 mg total) by mouth daily. 11/17/21   Alford Highland, MD  buPROPion (WELLBUTRIN XL) 150 MG 24 hr tablet Take 150 mg by mouth daily. 10/27/19   [provider]  clonazePAM (KLONOPIN) 1 MG tablet Take 1 tablet (1 mg  total) by mouth 2 (two) times daily. 11/16/21   Alford Highland, MD  DULoxetine (CYMBALTA) 60 MG capsule Take 60 mg by mouth daily. 08/25/18   [provider]  fluticasone-salmeterol (ADVAIR) 250-50 MCG/ACT AEPB Inhale 1 puff into the lungs in the morning and at bedtime. 04/03/22   Raechel Chute, MD  gabapentin (NEURONTIN) 100 MG capsule Take 100 mg by mouth 2 (two) times daily. 06/18/18   [provider]  haloperidol (HALDOL) 2 MG tablet Take 1 tablet (2 mg total) by mouth every 6 (six) hours as needed for agitation. 11/16/21   Alford Highland, MD  ipratropium-albuterol (DUONEB) 0.5-2.5 (3) MG/3ML SOLN Take 3 mLs by nebulization in the morning, at noon, and at bedtime. 12/28/21   [provider]  losartan-hydrochlorothiazide (HYZAAR) 100-12.5 MG tablet Take 1 tablet by mouth daily. 02/20/21   [provider]  OXcarbazepine (TRILEPTAL) 150 MG tablet Take 150-300 mg by mouth See admin instructions. Take 150 mg (1 tablet) by mouth every morning, and 300 mg (2 tablets) at bedtime. 06/12/18   [provider]  polyethylene glycol (MIRALAX / GLYCOLAX) 17 g packet Take 17 g by mouth daily. 10/25/21   [provider]  QUEtiapine (SEROQUEL) 50 MG tablet Take 1 tablet (50 mg total) by mouth at bedtime. 02/19/22 03/21/22  Delfino Lovett, MD  rivastigmine (EXELON) 1.5 MG capsule Take 1.5 mg by mouth 2 (two) times daily. 03/28/20 02/10/22  [provider]  Tiotropium Bromide Monohydrate (SPIRIVA RESPIMAT) 2.5 MCG/ACT AERS Inhale 2 puffs into the lungs daily. 04/03/22   Raechel Chute, MD    Physical Exam: Vitals:   04/16/22 1150 04/16/22 1300 04/16/22 1430 04/16/22 1700  BP:  111/60 (!) 163/62 (!) 162/73  Pulse: (!) 47 (!) 50 (!) 54 (!) 56  Resp: 18 20 15 16   Temp:    98.1 F (36.7 C)  TempSrc:      SpO2: 100% 100% 100% 100%   General: Not in acute distress HEENT:       Eyes: PERRL, EOMI, no scleral icterus.       ENT: No discharge from the ears and  nose       Neck: positive JVD, no bruit, no mass felt. Heme: No neck lymph node enlargement. Cardiac: S1/S2, RRR, No murmurs, No gallops or rubs. Respiratory: has fine crackles bilaterally GI: Soft, nondistended, nontender, no organomegaly, BS present. GU: No hematuria Ext: 1+ pitting leg edema bilaterally. 1+DP/PT pulse bilaterally. Musculoskeletal: No joint deformities, No joint redness or warmth, no limitation of ROM in spin. Skin: No rashes.  Neuro: Confused, not orientated to place and time.  She knows her own name, cranial nerves II-XII grossly intact, moves all extremities. Psych: Patient is not psychotic, no suicidal or hemocidal ideation.  Labs on Admission: I have personally reviewed following  labs and imaging studies  CBC: Recent Labs  Lab 04/16/22 1139  WBC 4.2  HGB 11.1*  HCT 33.2*  MCV 90.7  PLT 540   Basic Metabolic Panel: Recent Labs  Lab 04/16/22 1139 04/16/22 1410  NA 122* 126*  K 3.5 2.7*  CL 84* 96*  CO2 29 23  GLUCOSE 120* 80  BUN 19 16  CREATININE 0.91 0.65  CALCIUM 8.8* 6.9*   GFR: Estimated Creatinine Clearance: 49.3 mL/min (by C-G formula based on SCr of 0.65 mg/dL). Liver Function Tests: Recent Labs  Lab 04/16/22 1139  AST 22  ALT 11  ALKPHOS 64  BILITOT 0.6  PROT 6.3*  ALBUMIN 3.7   No results for input(s): "LIPASE", "AMYLASE" in the last 168 hours. No results for input(s): "AMMONIA" in the last 168 hours. Coagulation Profile: No results for input(s): "INR", "PROTIME" in the last 168 hours. Cardiac Enzymes: No results for input(s): "CKTOTAL", "CKMB", "CKMBINDEX", "TROPONINI" in the last 168 hours. BNP (last 3 results) No results for input(s): "PROBNP" in the last 8760 hours. HbA1C: No results for input(s): "HGBA1C" in the last 72 hours. CBG: No results for input(s): "GLUCAP" in the last 168 hours. Lipid Profile: No results for input(s): "CHOL", "HDL", "LDLCALC", "TRIG", "CHOLHDL", "LDLDIRECT" in the last 72 hours. Thyroid  Function Tests: No results for input(s): "TSH", "T4TOTAL", "FREET4", "T3FREE", "THYROIDAB" in the last 72 hours. Anemia Panel: No results for input(s): "VITAMINB12", "FOLATE", "FERRITIN", "TIBC", "IRON", "RETICCTPCT" in the last 72 hours. Urine analysis:    Component Value Date/Time   COLORURINE STRAW (A) 11/13/2021 1702   APPEARANCEUR CLEAR (A) 11/13/2021 1702   LABSPEC 1.009 11/13/2021 1702   PHURINE 7.0 11/13/2021 1702   GLUCOSEU NEGATIVE 11/13/2021 1702   HGBUR NEGATIVE 11/13/2021 1702   BILIRUBINUR NEGATIVE 11/13/2021 1702   KETONESUR NEGATIVE 11/13/2021 1702   PROTEINUR NEGATIVE 11/13/2021 1702   NITRITE NEGATIVE 11/13/2021 1702   LEUKOCYTESUR NEGATIVE 11/13/2021 1702   Sepsis Labs: @LABRCNTIP (procalcitonin:4,lacticidven:4) ) Recent Results (from the past 240 hour(s))  Resp panel by RT-PCR (RSV, Flu A&B, Covid) Anterior Nasal Swab     Status: None   Collection Time: 04/16/22 11:46 AM   Specimen: Anterior Nasal Swab  Result Value Ref Range Status   SARS Coronavirus 2 by RT PCR NEGATIVE NEGATIVE Final    Comment: (NOTE) SARS-CoV-2 target nucleic acids are NOT DETECTED.  The SARS-CoV-2 RNA is generally detectable in upper respiratory specimens during the acute phase of infection. The lowest concentration of SARS-CoV-2 viral copies this assay can detect is 138 copies/mL. A negative result does not preclude SARS-Cov-2 infection and should not be used as the sole basis for treatment or other patient management decisions. A negative result may occur with  improper specimen collection/handling, submission of specimen other than nasopharyngeal swab, presence of viral mutation(s) within the areas targeted by this assay, and inadequate number of viral copies(<138 copies/mL). A negative result must be combined with clinical observations, patient history, and epidemiological information. The expected result is Negative.  Fact Sheet for Patients:   EntrepreneurPulse.com.au  Fact Sheet for Healthcare Providers:  IncredibleEmployment.be  This test is no t yet approved or cleared by the [place] FDA and  has been authorized for detection and/or diagnosis of SARS-CoV-2 by FDA under an Emergency Use Authorization (EUA). This EUA will remain  in effect (meaning this test can be used) for the duration of the COVID-19 declaration under Section 564(b)(1) of the Act, 21 U.S.C.section 360bbb-3(b)(1), unless the authorization is terminated  or revoked  sooner.       Influenza A by PCR NEGATIVE NEGATIVE Final   Influenza B by PCR NEGATIVE NEGATIVE Final    Comment: (NOTE) The Xpert Xpress SARS-CoV-2/FLU/RSV plus assay is intended as an aid in the diagnosis of influenza from Nasopharyngeal swab specimens and should not be used as a sole basis for treatment. Nasal washings and aspirates are unacceptable for Xpert Xpress SARS-CoV-2/FLU/RSV testing.  Fact Sheet for Patients: EntrepreneurPulse.com.au  Fact Sheet for Healthcare Providers: IncredibleEmployment.be  This test is not yet approved or cleared by the [place] FDA and has been authorized for detection and/or diagnosis of SARS-CoV-2 by FDA under an Emergency Use Authorization (EUA). This EUA will remain in effect (meaning this test can be used) for the duration of the COVID-19 declaration under Section 564(b)(1) of the Act, 21 U.S.C. section 360bbb-3(b)(1), unless the authorization is terminated or revoked.     Resp Syncytial Virus by PCR NEGATIVE NEGATIVE Final    Comment: (NOTE) Fact Sheet for Patients: EntrepreneurPulse.com.au  Fact Sheet for Healthcare Providers: IncredibleEmployment.be  This test is not yet approved or cleared by the [place] FDA and has been authorized for detection and/or diagnosis of SARS-CoV-2 by FDA under an Emergency Use  Authorization (EUA). This EUA will remain in effect (meaning this test can be used) for the duration of the COVID-19 declaration under Section 564(b)(1) of the Act, 21 U.S.C. section 360bbb-3(b)(1), unless the authorization is terminated or revoked.  Performed at [facility], [address] [place], [place] [address]      Radiological Exams on Admission: DG Chest [facility] 1 View  Result Date: 04/16/2022 CLINICAL DATA:  Pt to ED via EMS from [facility] for increased SOB, and HTN today, with bilat foot swelling x1 week. Pt hx dementia, COPD EXAM: PORTABLE CHEST - 1 VIEW COMPARISON:  03/08/2022 FINDINGS: Mild bibasilar interstitial opacities. No confluent airspace disease. Heart size and mediastinal contours are within normal limits. Aortic Atherosclerosis (ICD10-170.0). No effusion. Visualized bones unremarkable. IMPRESSION: Mild bibasilar interstitial opacities, new since previous, may represent subsegmental atelectasis versus early infiltrates or edema. Electronically Signed   By: Lucrezia Europe M.D.   On: 04/16/2022 13:20      Assessment/Plan Principal Problem:   Acute on chronic diastolic CHF (congestive heart failure) (HCC) Active Problems:   Hyponatremia   HTN (hypertension)   COPD (chronic obstructive pulmonary disease) (HCC)   Dementia with behavioral disturbance (HCC)   Hypothermia   Depression with anxiety   Assessment and Plan:  Acute on chronic diastolic CHF (congestive heart failure) ([place]): 2D echo on 12/07/2020 showed EF > 50% with grade 2 diastolic dysfunction.  Patient has 1+ leg edema, positive JVD, elevated BNP, interstitial edema on chest x-ray, clinically consistent with CHF exacerbation.  Since patient has severe hyponatremia with sodium 122, and also patient does not have oxygen desaturation, I will hold off IV Lasix, and treat hyponatremia first.  When hyponatremia is corrected, will start on IV Lasix. -Admitted to telemetry bed as inpatient -Watch  volume status closely -Hold off IV Lasix now -Consult renal, Dr. Candiss Norse  Hyponatremia: Sodium 122 - Will check urine sodium, urine osmolality, serum osmolality. - Fluid restriction - Sodium chloride tablet 1 g twice daily - f/u by BMP q6h - avoid over correction too fast due to risk of central pontine myelinolysis -consulted Dr. Candiss Norse of renal  HTN (hypertension) -Hold the Hyzaar due to hyponatremia -IV hydralazine as needed -Amlodipine -Hold the beta-blocker due to bradycardia  COPD (chronic  obstructive pulmonary disease) ([place]): No wheezing on auscultation -Bronchodilators  Dementia with behavioral disturbance and depression with anxiety -As needed Haldol Exelon, Seroquel, Cymbalta, Klonopin, Wellbutrin  Hypothermia -Bair hugger       DVT ppx:  SQ Lovenox  Code Status: DNR (patient has DNR document from her facility)  Family Communication:  I have tried to call her sister and niece both of them did not pick up the phone.  I could not leave message since mailbox is either not set up or full  Disposition Plan:  Anticipate discharge back to previous environment, SNF  Consults called:  Dr. Candiss Norse of renal  Admission status and Level of care: Telemetry Cardiac: as inpt     Dispo: The patient is from: SNF              Anticipated d/c is to: SNF              Anticipated d/c date is: 2 days              Patient currently is not medically stable to d/c.    Severity of Illness:  The appropriate patient status for this patient is INPATIENT. Inpatient status is judged to be reasonable and necessary in order to provide the required intensity of service to ensure the patient's safety. The patient's presenting symptoms, physical exam findings, and initial radiographic and laboratory data in the context of their chronic comorbidities is felt to place them at high risk for further clinical deterioration. Furthermore, it is not anticipated that the patient will be medically stable  for discharge from the hospital within 2 midnights of admission.   * I certify that at the point of admission it is my clinical judgment that the patient will require inpatient hospital care spanning beyond 2 midnights from the point of admission due to high intensity of service, high risk for further deterioration and high frequency of surveillance required.*       Date of Service 04/16/2022    Ivor [facility]   If 7PM-7AM, please contact night-coverage [email or web address] 04/16/2022, 6:47 PM

## 2022-04-16 NOTE — ED Notes (Signed)
Patient is alert to self. But will not participate in swallow screen test for me at this time to move her off of NPO. States she does not want to. Will attempt later in the shift.

## 2022-04-16 NOTE — ED Notes (Signed)
Pt unable to participate in swallow screen. Pt will not follow commands. Pt will not open eyes nor answer questions. Pt is moving around in bed.

## 2022-04-17 DIAGNOSIS — I5033 Acute on chronic diastolic (congestive) heart failure: Secondary | ICD-10-CM | POA: Diagnosis not present

## 2022-04-17 LAB — BASIC METABOLIC PANEL
Anion gap: 7 (ref 5–15)
Anion gap: 6 (ref 5–15)
Anion gap: 2 — ABNORMAL LOW (ref 5–15)
Anion gap: 8 (ref 5–15)
BUN: 14 mg/dL (ref 8–23)
BUN: 15 mg/dL (ref 8–23)
BUN: 17 mg/dL (ref 8–23)
BUN: 20 mg/dL (ref 8–23)
CO2: 29 mmol/L (ref 22–32)
CO2: 30 mmol/L (ref 22–32)
CO2: 30 mmol/L (ref 22–32)
CO2: 31 mmol/L (ref 22–32)
Calcium: 8.6 mg/dL — ABNORMAL LOW (ref 8.9–10.3)
Calcium: 8.6 mg/dL — ABNORMAL LOW (ref 8.9–10.3)
Calcium: 8.2 mg/dL — ABNORMAL LOW (ref 8.9–10.3)
Calcium: 8.6 mg/dL — ABNORMAL LOW (ref 8.9–10.3)
Chloride: 87 mmol/L — ABNORMAL LOW (ref 98–111)
Chloride: 91 mmol/L — ABNORMAL LOW (ref 98–111)
Chloride: 92 mmol/L — ABNORMAL LOW (ref 98–111)
Chloride: 89 mmol/L — ABNORMAL LOW (ref 98–111)
Creatinine, Ser: 0.66 mg/dL (ref 0.44–1.00)
Creatinine, Ser: 0.83 mg/dL (ref 0.44–1.00)
Creatinine, Ser: 1.16 mg/dL — ABNORMAL HIGH (ref 0.44–1.00)
Creatinine, Ser: 0.96 mg/dL (ref 0.44–1.00)
GFR, Estimated: >60 mL/min (ref >60)
GFR, Estimated: >60 mL/min (ref >60)
GFR, Estimated: 47 mL/min — ABNORMAL LOW (ref >60)
GFR, Estimated: 59 mL/min — ABNORMAL LOW (ref >60)
Glucose, Bld: 97 mg/dL (ref 70–99)
Glucose, Bld: 92 mg/dL (ref 70–99)
Glucose, Bld: 150 mg/dL — ABNORMAL HIGH (ref 70–99)
Glucose, Bld: 125 mg/dL — ABNORMAL HIGH (ref 70–99)
Potassium: 3.7 mmol/L (ref 3.5–5.1)
Potassium: 3.9 mmol/L (ref 3.5–5.1)
Potassium: 3.9 mmol/L (ref 3.5–5.1)
Potassium: 4 mmol/L (ref 3.5–5.1)
Sodium: 123 mmol/L — ABNORMAL LOW (ref 135–145)
Sodium: 127 mmol/L — ABNORMAL LOW (ref 135–145)
Sodium: 124 mmol/L — ABNORMAL LOW (ref 135–145)
Sodium: 128 mmol/L — ABNORMAL LOW (ref 135–145)

## 2022-04-17 LAB — TSH: TSH: 4.727 u[IU]/mL — ABNORMAL HIGH (ref 0.350–4.500)

## 2022-04-17 LAB — CBC
HCT: 36 % (ref 36.0–46.0)
Hemoglobin: 12.1 g/dL (ref 12.0–15.0)
MCH: 30.2 pg (ref 26.0–34.0)
MCHC: 33.6 g/dL (ref 30.0–36.0)
MCV: 89.8 fL (ref 80.0–100.0)
Platelets: 258 10*3/uL (ref 150–400)
RBC: 4.01 MIL/uL (ref 3.87–5.11)
RDW: 12.9 % (ref 11.5–15.5)
WBC: 4.9 10*3/uL (ref 4.0–10.5)
nRBC: 0 % (ref 0.0–0.2)

## 2022-04-17 LAB — CBG MONITORING, ED: Glucose-Capillary: 90 mg/dL (ref 70–99)

## 2022-04-17 LAB — MAGNESIUM: Magnesium: 1.8 mg/dL (ref 1.7–2.4)

## 2022-04-17 LAB — OSMOLALITY, URINE: Osmolality, Ur: 488 mOsm/kg (ref 300–900)

## 2022-04-17 LAB — SODIUM, URINE, RANDOM: Sodium, Ur: 64 mmol/L

## 2022-04-17 MED ORDER — IPRATROPIUM-ALBUTEROL 0.5-2.5 (3) MG/3ML IN SOLN
3.0000 mL | Freq: Three times a day (TID) | RESPIRATORY_TRACT | Status: DC
  Administered 2022-04-18 (×3): 3 mL via RESPIRATORY_TRACT
  Filled 2022-04-17 (×6): qty 3

## 2022-04-17 MED ORDER — FUROSEMIDE 10 MG/ML IJ SOLN
40.0000 mg | Freq: Once | INTRAMUSCULAR | Status: AC
  Administered 2022-04-17: 40 mg via INTRAVENOUS
  Filled 2022-04-17: qty 4

## 2022-04-17 NOTE — ED Notes (Signed)
Nurse in room to check on patient. Patient has soiled brief, large loose BM. Patient cleaned, linens changed. Patient denies any other needs.

## 2022-04-17 NOTE — Hospital Course (Addendum)
Samantha Carpenter is a 82 y.o. female with medical history significant of dCHF, hypertension, hyperlipidemia COPD, dementia, depression with anxiety, who presented from SNF on 04/16/2022 for evaluation of shortness breath and leg edema.  Due to dementia, patient is unable to provide history, and attempts to reach family at time of admission were unsuccessful.  History in H&P taken from reports provided by facility staff and EMS.  Evaluation in the ED and physical exam are consistent with decompensated heart failure.  Chest xray with interstitial edema.  She was admitted to medicine service and started on IV diuresis.

## 2022-04-17 NOTE — Assessment & Plan Note (Signed)
Not acutely exacerbated. --Bronchodilators

## 2022-04-17 NOTE — ED Notes (Signed)
Nurse in room to check patient. Patient has wet brief, Patient cleaned and brief changed. Patient denies any other needs at this time.

## 2022-04-17 NOTE — Assessment & Plan Note (Addendum)
Echo on 12/07/2020 showed EF > 50% with grade 2 diastolic dysfunction.  Presented with 1+ LE edema, positive JVD, elevated BNP, interstitial edema on chest x-ray, clinically consistent with CHF exacerbation.   Due to hyponatremia with Na 122, we are correcting her sodium to a safe level before proceeding with diuresis, since her respiratory status is stable. Sodium level responded to diuresis. --Start IV Lasix 20 mg BID  --Monitor renal function, electrolytes --Monitor volume status - strict I/O's, daily weights

## 2022-04-17 NOTE — Assessment & Plan Note (Signed)
Continue Exelon, Seroquel, Cymbalta, Klonopin, Wellbutrin. PRN Haldol for agitation

## 2022-04-17 NOTE — Progress Notes (Addendum)
Progress Note   Patient: Samantha Carpenter CZY:[id number] DOB: [birth year] DOA: 04/16/2022     1 DOS: the patient was seen and examined on 04/17/2022   Brief hospital course: Samantha Carpenter is a 82 y.o. female with medical history significant of dCHF, hypertension, hyperlipidemia COPD, dementia, depression with anxiety, who presented from SNF on 04/16/2022 for evaluation of shortness breath and leg edema.  Due to dementia, patient is unable to provide history, and attempts to reach family at time of admission were unsuccessful.  History in H&P taken from reports provided by facility staff and EMS.  Evaluation in the ED and physical exam are consistent with decompensated heart failure.  Chest xray with interstitial edema.  She was admitted to medicine service and started on IV diuresis.      Assessment and Plan: * Acute on chronic diastolic CHF (congestive heart failure) ([place]) Echo on 12/07/2020 showed EF > 50% with grade 2 diastolic dysfunction.  Presented with 1+ LE edema, positive JVD, elevated BNP, interstitial edema on chest x-ray, clinically consistent with CHF exacerbation.   Due to hyponatremia with Na 122, we are correcting her sodium to a safe level before proceeding with diuresis, since her respiratory status is stable. --Monitor volume status - strict I/O's, daily weights --Hold off IV Lasix now, start diuresis once sodium improved  Hyponatremia Na 122 on admission.  Started on fluid restriction and salt tablets on admission.   Na today is improved at 127. --Nephrology consulted on admission, appreciate input --Monitor BMP closely --Continue fluid restriction, salt tablets  HTN (hypertension) Hold Hyzaar due to hyponatremia. Hold beta blocker with bradycardia. Continue amlodipine PRN hydralazine  COPD (chronic obstructive pulmonary disease) ([place]) Not acutely exacerbated. --Bronchodilators  Dementia with behavioral disturbance (HCC) Continue Exelon, Seroquel, Cymbalta, Klonopin,  Wellbutrin. PRN Haldol for agitation  Hypothermia POA, resolved  Depression with anxiety Continue home meds (see dementia)  Hypokalemia POA with K 2.7.  Potassium was replaced. K today 3.9 Monitor and replace PRN.        Subjective: Pt seen in ED, holding for a bed.  She was sleeping comfortable, would not open her eyes, but did respond to me.  She denies trouble breathing, pain or feeling sick.  No acute events reported.  Physical Exam: Vitals:   04/17/22 0830 04/17/22 1000 04/17/22 1300 04/17/22 1400  BP: (!) 132/54 (!) 146/60 (!) 135/51 (!) 154/61  Pulse: (!) 50 70 60 (!) 56  Resp: 20 18 16 16   Temp:  98.9 F (37.2 C)  98.6 F (37 C)  TempSrc:    Oral  SpO2: 100% 100%     General exam: sleeping comfortable, no acute distress HEENT: keeps eyes closed, moist mucus membranes, hearing grossly normal  Respiratory system: CTAB diminished bases, no wheezes, rales or rhonchi, normal respiratory effort. Cardiovascular system: normal S1/S2, RRR, +JVD, 1+ BLE edema.   Gastrointestinal system: soft, NT, ND, no HSM felt, +bowel sounds. Central nervous system: unable to assess due to somnolence, pt is responsive to verbal and tactile stimulus Extremities: moves all  no edema, normal tone Skin: dry, intact, normal temperature, normal color, No rashes, lesions or ulcers Psychiatry: unable to assess due to somnolence   Data Reviewed:  Notable labs -- -Na improved 127,  Ca 8.6. CBC normal  Family Communication: None present. Will attempt to call.  Disposition: Status is: Inpatient Remains inpatient appropriate because: remains hyponatremic and volume overloaded as above.    Planned Discharge Destination: Skilled nursing facility    Time spent: 2  minutes  Author: Ezekiel Slocumb, DO 04/17/2022 2:47 PM  For on call review [email or web address].

## 2022-04-17 NOTE — Assessment & Plan Note (Addendum)
Na 122 on admission.  Hypotonic (serum osm 266). Seems acute, sodium was normal 139-140 in December. Started on fluid restriction and salt tablets on admission.    Na today 127 >> 124 this afternoon. --Nephrology consulted on admission, appreciate input --Monitor BMP closely --Continue fluid restriction,  --Stop salt tablets started on admission as they will cause fluid retention and she has signs of fluid overload already --Follow pending urine sodium & urine osmolality --Add on TSH --Check AM cortisol --Suspect this is hypervolemia & will give IV Lasix 40 mg x 1 this afternoon & repeat BMP later this evening to assess response

## 2022-04-17 NOTE — Assessment & Plan Note (Signed)
POA, resolved. 

## 2022-04-17 NOTE — Progress Notes (Signed)
Received pt awake in bed from ED. Pleasantly confused and is only oriented to self but able to follow commands. Not aggressive or restless. Pt was able to correctly identify what year it is but had a difficult time remembering what brought her to the hospital. Unable to complete admission notes for now. Reoriented pt to time, person and place, ineffective. Due night meds given. Applied anti skid socks. Bed alarms are on and kept call light within reach.

## 2022-04-17 NOTE — Assessment & Plan Note (Signed)
POA with K 2.7.  Potassium was replaced. K today 3.9 Monitor and replace PRN.

## 2022-04-17 NOTE — Assessment & Plan Note (Signed)
Continue home meds (see dementia)

## 2022-04-17 NOTE — Assessment & Plan Note (Signed)
Hold Hyzaar due to hyponatremia. Hold beta blocker with bradycardia. Continue amlodipine PRN hydralazine

## 2022-04-18 ENCOUNTER — Inpatient Hospital Stay
Admit: 2022-04-18 | Discharge: 2022-04-18 | Disposition: A | Discharge status: Home / Self Care | Payer: Medicare HMO | Attending: Internal Medicine | Admitting: Internal Medicine

## 2022-04-18 DIAGNOSIS — I5033 Acute on chronic diastolic (congestive) heart failure: Secondary | ICD-10-CM | POA: Diagnosis not present

## 2022-04-18 LAB — GLUCOSE, CAPILLARY
Glucose-Capillary: 82 mg/dL (ref 70–99)
Glucose-Capillary: 180 mg/dL — ABNORMAL HIGH (ref 70–99)
Glucose-Capillary: 140 mg/dL — ABNORMAL HIGH (ref 70–99)

## 2022-04-18 LAB — BASIC METABOLIC PANEL
Anion gap: 7 (ref 5–15)
BUN: 21 mg/dL (ref 8–23)
CO2: 32 mmol/L (ref 22–32)
Calcium: 8.6 mg/dL — ABNORMAL LOW (ref 8.9–10.3)
Chloride: 89 mmol/L — ABNORMAL LOW (ref 98–111)
Creatinine, Ser: 0.9 mg/dL (ref 0.44–1.00)
GFR, Estimated: >60 mL/min (ref >60)
Glucose, Bld: 102 mg/dL — ABNORMAL HIGH (ref 70–99)
Potassium: 3.9 mmol/L (ref 3.5–5.1)
Sodium: 128 mmol/L — ABNORMAL LOW (ref 135–145)

## 2022-04-18 LAB — T4, FREE: Free T4: 0.79 ng/dL (ref 0.61–1.12)

## 2022-04-18 LAB — PHOSPHORUS: Phosphorus: 4 mg/dL (ref 2.5–4.6)

## 2022-04-18 LAB — ECHOCARDIOGRAM COMPLETE
AR max vel: 2.81 cm2
AV Area VTI: 2.98 cm2
AV Area mean vel: 2.72 cm2
AV Mean grad: 4 mmHg
AV Peak grad: 6.9 mmHg
Ao pk vel: 1.31 m/s
Area-P 1/2: 2.06 cm2
S' Lateral: 2.2 cm

## 2022-04-18 LAB — MAGNESIUM: Magnesium: 1.6 mg/dL — ABNORMAL LOW (ref 1.7–2.4)

## 2022-04-18 LAB — CORTISOL-AM, BLOOD: Cortisol - AM: 1.3 ug/dL — ABNORMAL LOW (ref 6.7–22.6)

## 2022-04-18 MED ORDER — FUROSEMIDE 10 MG/ML IJ SOLN
20.0000 mg | Freq: Two times a day (BID) | INTRAMUSCULAR | Status: DC
  Administered 2022-04-18 – 2022-04-22 (×10): 20 mg via INTRAVENOUS
  Filled 2022-04-18 (×11): qty 2

## 2022-04-18 MED ORDER — MAGNESIUM SULFATE 4 GM/100ML IV SOLN
4.0000 g | Freq: Once | INTRAVENOUS | Status: AC
  Administered 2022-04-18: 4 g via INTRAVENOUS
  Filled 2022-04-18: qty 100

## 2022-04-18 MED ORDER — COSYNTROPIN 0.25 MG IJ SOLR
0.2500 mg | Freq: Once | INTRAMUSCULAR | Status: AC
  Administered 2022-04-19: 0.25 mg via INTRAVENOUS
  Filled 2022-04-18: qty 0.25

## 2022-04-18 NOTE — Evaluation (Signed)
Occupational Therapy Evaluation Patient Details Name: Samantha Carpenter MRN: [id number] DOB: 07/30/1940 Today's Date: 04/18/2022   History of Present Illness 82 y.o. female with medical history significant of dCHF, hypertension, hyperlipidemia COPD, dementia, depression with anxiety, who presented from SNF on 04/16/2022 for evaluation of shortness breath and leg edema.   Clinical Impression   Patient presenting with decreased Ind in self care,balance, functional mobility/transfers, endurance, and safety awareness. Patient reports being mod I at baseline and living at [facility]. Pt endorses having SPC but does not use it for mobility and is Ind in self care tasks. No family present to confirm baseline.  Patient currently functioning at supervision overall without use of AD for functional mobility and grooming tasks in standing. Patient will benefit from acute OT to increase overall independence in the areas of ADLs, functional mobility, and safety awareness in order to safely discharge back to ALF.     Recommendations for follow up therapy are one component of a multi-disciplinary discharge planning process, led by the attending physician.  Recommendations may be updated based on patient status, additional functional criteria and insurance authorization.   Follow Up Recommendations  Home health OT     Assistance Recommended at Discharge Intermittent Supervision/Assistance  Patient can return home with the following A little help with walking and/or transfers;A little help with bathing/dressing/bathroom;Assist for transportation;Assistance with cooking/housework;Direct supervision/assist for financial management;Direct supervision/assist for medications management    Functional Status Assessment  Patient has had a recent decline in their functional status and demonstrates the ability to make significant improvements in function in a reasonable and predictable amount of time.   Equipment Recommendations  None recommended by OT       Precautions / Restrictions Precautions Precautions: Fall Restrictions Weight Bearing Restrictions: No      Mobility Bed Mobility Overal bed mobility: Modified Independent                  Transfers Overall transfer level: Modified independent Equipment used: Rolling walker (2 wheels)                      Balance Overall balance assessment: Needs assistance                                         ADL either performed or assessed with clinical judgement   ADL Overall ADL's : Needs assistance/impaired                                       General ADL Comments: supervision overall for ambulation and grooming tasks standing at sink     Vision Patient Visual Report: No change from baseline              Pertinent Vitals/Pain Pain Assessment Pain Assessment: No/denies pain     Hand Dominance Right   Extremity/Trunk Assessment Upper Extremity Assessment Upper Extremity Assessment: Defer to OT evaluation   Lower Extremity Assessment Lower Extremity Assessment: Overall WFL for tasks assessed   Cervical / Trunk Assessment Cervical / Trunk Assessment: Normal   Communication Communication Communication: No difficulties   Cognition Arousal/Alertness: Awake/alert Behavior During Therapy: WFL for tasks assessed/performed Overall Cognitive Status: History of cognitive impairments - at baseline  General Comments: Pt is oriented to self and locations. She is very pleasant and cooperative and follows commands with increased time.                Home Living Family/patient expects to be discharged to:: Assisted living                             Home Equipment: Kasandra Knudsen - single point   Additional Comments: [facility] per chart review      Prior Functioning/Environment Prior Level  of Function : Patient poor historian/Family not available             Mobility Comments: Pt reports occasionally using SPC but in general does not use any AD for mobility. Reports falls ADLs Comments: Pt endorses Ind with self care tasks        OT Problem List: Decreased strength;Decreased activity tolerance;Impaired balance (sitting and/or standing);Decreased knowledge of use of DME or AE;Decreased safety awareness      OT Treatment/Interventions: Self-care/ADL training;Therapeutic exercise;Therapeutic activities;DME and/or AE instruction;Balance training;Patient/family education;Energy conservation    OT Goals(Current goals can be found in the care plan section) Acute Rehab OT Goals Patient Stated Goal: to get better OT Goal Formulation: With patient Time For Goal Achievement: 05/02/22 Potential to Achieve Goals: Good ADL Goals Pt Will Perform Grooming: with modified independence;standing Pt Will Perform Lower Body Dressing: with modified independence;sit to/from stand Pt Will Transfer to Toilet: with modified independence;ambulating Pt Will Perform Toileting - Clothing Manipulation and hygiene: with modified independence;sit to/from stand  OT Frequency: Min 2X/week       AM-PAC OT "6 Clicks" Daily Activity     Outcome Measure Help from another person eating meals?: None Help from another person taking care of personal grooming?: None Help from another person toileting, which includes using toliet, bedpan, or urinal?: None Help from another person bathing (including washing, rinsing, drying)?: None Help from another person to put on and taking off regular upper body clothing?: None Help from another person to put on and taking off regular lower body clothing?: None 6 Click Score: 24   End of Session Equipment Utilized During Treatment: Oxygen Nurse Communication: Mobility status  Activity Tolerance: Patient tolerated treatment well Patient left: in bed;with call  bell/phone within reach;with bed alarm set  OT Visit Diagnosis: Unsteadiness on feet (R26.81)                Time: 0946-1000 OT Time Calculation (min): 14 min Charges:  OT General Charges $OT Visit: 1 Visit OT Evaluation $OT Eval Moderate Complexity: 1 [address] [place], OTR/L , CBIS ascom [phone]  04/18/22, 1:46 PM

## 2022-04-18 NOTE — Progress Notes (Signed)
*  PRELIMINARY RESULTS* Echocardiogram 2D Echocardiogram has been performed.  Samantha Carpenter 04/18/2022, 11:15 AM

## 2022-04-18 NOTE — Evaluation (Signed)
Physical Therapy Evaluation Patient Details Name: Samantha Carpenter MRN: [id number] DOB: September 27, 1940 Today's Date: 04/18/2022  History of Present Illness  82 y.o. female with medical history significant of dCHF, hypertension, hyperlipidemia COPD, dementia, depression with anxiety, who presented from SNF on 04/16/2022 for evaluation of shortness breath and leg edema.  Clinical Impression  Patient received in bed, she is slightly confused, but pleasant and agreeable to PT assessment. Patient is mod I with bed mobility and supervision for transfers. She is able to ambulate with RW 25 feet with min guard. No lob, slightly impulsive with decreased safety awareness. Patient will continue to benefit from skilled PT to improve safety and strength.        Recommendations for follow up therapy are one component of a multi-disciplinary discharge planning process, led by the attending physician.  Recommendations may be updated based on patient status, additional functional criteria and insurance authorization.  Follow Up Recommendations Home health PT      Assistance Recommended at Discharge Frequent or constant Supervision/Assistance  Patient can return home with the following  A little help with walking and/or transfers;A little help with bathing/dressing/bathroom    Equipment Recommendations Rolling walker (2 wheels)  Recommendations for Other Services       Functional Status Assessment Patient has had a recent decline in their functional status and demonstrates the ability to make significant improvements in function in a reasonable and predictable amount of time.     Precautions / Restrictions Precautions Precautions: Fall Restrictions Weight Bearing Restrictions: No      Mobility  Bed Mobility Overal bed mobility: Modified Independent                  Transfers Overall transfer level: Modified independent Equipment used: Rolling walker (2 wheels)                     Ambulation/Gait Ambulation/Gait assistance: Supervision Gait Distance (Feet): 25 Feet Assistive device: Rolling walker (2 wheels) Gait Pattern/deviations: Step-through pattern Gait velocity: normal     General Gait Details: patient is slightly impulsive. No lob.  Stairs            Wheelchair Mobility    Modified Rankin (Stroke Patients Only)       Balance Overall balance assessment: Needs assistance Sitting-balance support: Feet supported Sitting balance-Leahy Scale: Good     Standing balance support: Bilateral upper extremity supported, During functional activity, Reliant on assistive device for balance Standing balance-Leahy Scale: Fair Standing balance comment: slightly impulsive, decreased safety awareness                             Pertinent Vitals/Pain Pain Assessment Pain Assessment: No/denies pain Breathing: normal Negative Vocalization: none Facial Expression: smiling or inexpressive Body Language: relaxed Consolability: no need to console PAINAD Score: 0    Home Living Family/patient expects to be discharged to:: Assisted living                 Home Equipment: Kasandra Knudsen - single point Additional Comments: [facility] per chart review    Prior Function Prior Level of Function : Patient poor historian/Family not available             Mobility Comments: Pt reports occasionally using SPC but in general does not use any AD for mobility. Reports falls ADLs Comments: Pt endorses Ind with self care tasks     Hand Dominance  Dominant Hand: Right    Extremity/Trunk Assessment   Upper Extremity Assessment Upper Extremity Assessment: Defer to OT evaluation    Lower Extremity Assessment Lower Extremity Assessment: Overall WFL for tasks assessed    Cervical / Trunk Assessment Cervical / Trunk Assessment: Normal  Communication   Communication: No difficulties  Cognition Arousal/Alertness:  Awake/alert Behavior During Therapy: WFL for tasks assessed/performed Overall Cognitive Status: History of cognitive impairments - at baseline                                 General Comments: Pt is oriented to self and locations. She is very pleasant and cooperative and follows commands with increased time.        General Comments      Exercises     Assessment/Plan    PT Assessment Patient needs continued PT services  PT Problem List Decreased strength;Decreased activity tolerance;Decreased mobility;Decreased safety awareness;Decreased knowledge of use of DME;Decreased cognition       PT Treatment Interventions DME instruction;Therapeutic exercise;Gait training;Balance training;Functional mobility training;Therapeutic activities;Patient/family education;Neuromuscular re-education    PT Goals (Current goals can be found in the Care Plan section)  Acute Rehab PT Goals Patient Stated Goal: to go home PT Goal Formulation: With patient Time For Goal Achievement: 04/25/22 Potential to Achieve Goals: Good    Frequency Min 2X/week     Co-evaluation               AM-PAC PT "6 Clicks" Mobility  Outcome Measure Help needed turning from your back to your side while in a flat bed without using bedrails?: None Help needed moving from lying on your back to sitting on the side of a flat bed without using bedrails?: None Help needed moving to and from a bed to a chair (including a wheelchair)?: A Little Help needed standing up from a chair using your arms (e.g., wheelchair or bedside chair)?: A Little Help needed to walk in hospital room?: A Little Help needed climbing 3-5 steps with a railing? : A Lot 6 Click Score: 19    End of Session Equipment Utilized During Treatment: Gait belt;Oxygen Activity Tolerance: Patient tolerated treatment well Patient left: in chair;with call bell/phone within reach;with chair alarm set Nurse Communication: Mobility status PT  Visit Diagnosis: Unsteadiness on feet (R26.81);Muscle weakness (generalized) (M62.81);Difficulty in walking, not elsewhere classified (R26.2)    Time: 1310-1335 PT Time Calculation (min) (ACUTE ONLY): 25 min   Charges:   PT Evaluation $PT Eval Moderate Complexity: 1 Mod PT Treatments $Gait Training: 8-22 mins        Mort Smelser, PT, GCS 04/18/22,1:43 PM

## 2022-04-18 NOTE — Progress Notes (Signed)
Progress Note   Patient: Samantha Carpenter PIR:[id number] DOB: [birth year] DOA: 04/16/2022     2 DOS: the patient was seen and examined on 04/18/2022   Brief hospital course: Samantha Carpenter is a 82 y.o. female with medical history significant of dCHF, hypertension, hyperlipidemia COPD, dementia, depression with anxiety, who presented from SNF on 04/16/2022 for evaluation of shortness breath and leg edema.  Due to dementia, patient is unable to provide history, and attempts to reach family at time of admission were unsuccessful.  History in H&P taken from reports provided by facility staff and EMS.  Evaluation in the ED and physical exam are consistent with decompensated heart failure.  Chest xray with interstitial edema.  She was admitted to medicine service and started on IV diuresis.      Assessment and Plan: * Acute on chronic diastolic CHF (congestive heart failure) ([place]) Echo on 12/07/2020 showed EF > 50% with grade 2 diastolic dysfunction.  Presented with 1+ LE edema, positive JVD, elevated BNP, interstitial edema on chest x-ray, clinically consistent with CHF exacerbation.   Due to hyponatremia with Na 122, we are correcting her sodium to a safe level before proceeding with diuresis, since her respiratory status is stable. Sodium level responded to diuresis. --Start IV Lasix 20 mg BID  --Monitor renal function, electrolytes --Monitor volume status - strict I/O's, daily weights  Hyponatremia Na 122 on admission.  Hypotonic (serum osm 266). Seems acute, sodium was normal 139-140 in December. Started on fluid restriction and salt tablets on admission.    Na today 127 >> 124 on fluid restriction and salt tablets only.   1/30 Given diuresis and salt tabs stopped. 1/31 Na improved 124 >> 128. --Continue diuresis as outlined --Monitor BMP closely --Continue fluid restriction,  --Stop salt tablets, they cause fluid retention --Mild elevation of TSH 4.727, add on free T4 --Low AM cortisol 1.3.   Check Cosyntropin stim test tomorrow.  HTN (hypertension) Hold Hyzaar due to hyponatremia. Hold beta blocker with bradycardia. Continue amlodipine PRN hydralazine  COPD (chronic obstructive pulmonary disease) ([place]) Not acutely exacerbated. --Bronchodilators  Dementia with behavioral disturbance (HCC) Continue Exelon, Seroquel, Cymbalta, Klonopin, Wellbutrin. PRN Haldol for agitation  Hypothermia POA, resolved  Depression with anxiety Continue home meds (see dementia)  Hypokalemia POA with K 2.7.  Potassium was replaced. K today 3.9 Monitor and replace PRN.        Subjective: Pt awake resting in bed when seen this AM. She reports feeling fine.  Pleasantly confused due to dementia, can't provide history.  No acute events reported.  Physical Exam: Vitals:   04/18/22 0733 04/18/22 0832 04/18/22 1129 04/18/22 1410  BP: (!) 148/60  (!) 130/51   Pulse: 63 64 64 65  Resp: 18 18 18 18   Temp: 97.8 F (36.6 C)  97.7 F (36.5 C)   TempSrc:      SpO2: 100% 96% 96%    General exam: awake & alert, no acute distress HEENT: moist mucus membranes, hearing grossly normal  Respiratory system: CTAB diminished bases, no wheezes, rales or rhonchi, normal respiratory effort. Cardiovascular system: normal S1/S2, RRR, +JVD, improved BLE edema.   Gastrointestinal system: soft, NT, ND Central nervous system: grossly non-focal, normal speech, alert & oriented to self Extremities: moves all  no edema, normal tone Skin: dry, intact, normal temperature Psychiatry: normal mood & affect, abnormal judgement and insight due to dementia   Data Reviewed:  Notable labs -- -Na improved 124>>128, Cl 89, glucose 102, Mg 1.6    AM  cortisol low 1.3   Family Communication: None present. Will attempt to call.  Disposition: Status is: Inpatient Remains inpatient appropriate because: remains hyponatremic and volume overloaded as above.    Planned Discharge Destination: Skilled nursing  facility    Time spent: 46 minutes  Author: Ezekiel Slocumb, DO 04/18/2022 2:57 PM  For on call review [email or web address].

## 2022-04-19 ENCOUNTER — Inpatient Hospital Stay: Payer: Medicare HMO

## 2022-04-19 DIAGNOSIS — I5033 Acute on chronic diastolic (congestive) heart failure: Secondary | ICD-10-CM | POA: Diagnosis not present

## 2022-04-19 LAB — BASIC METABOLIC PANEL
Anion gap: 4 — ABNORMAL LOW (ref 5–15)
BUN: 25 mg/dL — ABNORMAL HIGH (ref 8–23)
CO2: 36 mmol/L — ABNORMAL HIGH (ref 22–32)
Calcium: 8.5 mg/dL — ABNORMAL LOW (ref 8.9–10.3)
Chloride: 89 mmol/L — ABNORMAL LOW (ref 98–111)
Creatinine, Ser: 0.99 mg/dL (ref 0.44–1.00)
GFR, Estimated: 57 mL/min — ABNORMAL LOW (ref >60)
Glucose, Bld: 107 mg/dL — ABNORMAL HIGH (ref 70–99)
Potassium: 3.5 mmol/L (ref 3.5–5.1)
Sodium: 129 mmol/L — ABNORMAL LOW (ref 135–145)

## 2022-04-19 LAB — GLUCOSE, CAPILLARY
Glucose-Capillary: 101 mg/dL — ABNORMAL HIGH (ref 70–99)
Glucose-Capillary: 183 mg/dL — ABNORMAL HIGH (ref 70–99)
Glucose-Capillary: 86 mg/dL (ref 70–99)

## 2022-04-19 LAB — MAGNESIUM: Magnesium: 2.5 mg/dL — ABNORMAL HIGH (ref 1.7–2.4)

## 2022-04-19 MED ORDER — OLANZAPINE 5 MG PO TBDP
5.0000 mg | ORAL_TABLET | Freq: Every day | ORAL | Status: DC
  Filled 2022-04-19: qty 1

## 2022-04-19 MED ORDER — HALOPERIDOL LACTATE 5 MG/ML IJ SOLN
2.0000 mg | Freq: Once | INTRAMUSCULAR | Status: AC
  Administered 2022-04-19: 2 mg via INTRAMUSCULAR
  Filled 2022-04-19: qty 1

## 2022-04-19 MED ORDER — CLONAZEPAM 0.25 MG PO TBDP
1.0000 mg | ORAL_TABLET | Freq: Two times a day (BID) | ORAL | Status: DC
  Administered 2022-04-20 – 2022-04-23 (×7): 1 mg via ORAL
  Filled 2022-04-19 (×7): qty 4

## 2022-04-19 MED ORDER — QUETIAPINE FUMARATE 25 MG PO TABS
50.0000 mg | ORAL_TABLET | Freq: Every day | ORAL | Status: DC
  Administered 2022-04-20 – 2022-04-22 (×3): 50 mg via ORAL
  Filled 2022-04-19 (×3): qty 2

## 2022-04-19 MED ORDER — HALOPERIDOL LACTATE 5 MG/ML IJ SOLN
1.0000 mg | INTRAMUSCULAR | Status: AC
  Administered 2022-04-19: 1 mg via INTRAMUSCULAR
  Filled 2022-04-19: qty 1

## 2022-04-19 NOTE — Progress Notes (Signed)
Occupational Therapy Treatment Patient Details Name: Samantha Carpenter MRN: [id number] DOB: Jun 27, 1940 Today's Date: 04/19/2022   History of present illness 82 y.o. female with medical history significant of dCHF, hypertension, hyperlipidemia COPD, dementia, depression with anxiety, who presented from SNF on 04/16/2022 for evaluation of shortness breath and leg edema.   OT comments  Upon entering the room, pt is seated in recliner chair with home clothing donned. She is pleasant but oriented to self only and believes herself to be at a restaurant or store. She asks multiple times , " What time are you closing?" Pt pulls all contents of purse out into her lap and given multiple cues to don socks to go to bathroom per her request. Pt taking ~ 13 minutes to place items in purse and dons socks with supervision. Pt ambulates in room without use of AD to bathroom and performs toilet transfer, hygiene, and clothing management without assistance. Pt stands at sink for hand hygiene. OT suggested pt brushes teeth and she instead picks up toothbrush and scrubs rings with it. Pt then puts her purse on her shoulder and ambulates 200' with supervision and returns back to room. She is unable to navigate and locate room without assistance. Pt returns to seated position on recliner chair with chair alarm activated and all needs within reach. Pt may be close to baseline level.    Recommendations for follow up therapy are one component of a multi-disciplinary discharge planning process, led by the attending physician.  Recommendations may be updated based on patient status, additional functional criteria and insurance authorization.    Follow Up Recommendations  No OT follow up     Assistance Recommended at Discharge Intermittent Supervision/Assistance  Patient can return home with the following  A little help with walking and/or transfers;A little help with bathing/dressing/bathroom;Assist for transportation;Assistance  with cooking/housework;Direct supervision/assist for financial management;Direct supervision/assist for medications management   Equipment Recommendations  None recommended by OT       Precautions / Restrictions Precautions Precautions: Fall Restrictions Weight Bearing Restrictions: No       Mobility Bed Mobility               General bed mobility comments: seated in recliner chair at beginning/end of session    Transfers Overall transfer level: Modified independent Equipment used: None                     Balance Overall balance assessment: Needs assistance Sitting-balance support: Feet supported Sitting balance-Leahy Scale: Good     Standing balance support: During functional activity Standing balance-Leahy Scale: Good Standing balance comment: slightly impulsive, decreased safety awareness                           ADL either performed or assessed with clinical judgement   ADL Overall ADL's : Needs assistance/impaired                     Lower Body Dressing: Supervision/safety;Sitting/lateral leans   Toilet Transfer: Supervision/safety;Rolling walker (2 wheels)   Toileting- Clothing Manipulation and Hygiene: Supervision/safety;Sit to/from stand       Functional mobility during ADLs: Supervision/safety       Vision Patient Visual Report: No change from baseline            Cognition Arousal/Alertness: Awake/alert Behavior During Therapy: WFL for tasks assessed/performed Overall Cognitive Status: History of cognitive impairments - at baseline  General Comments: Pt is oriented to self only. Pleasantly confused during session.                   Pertinent Vitals/ Pain       Pain Assessment Pain Assessment: No/denies pain  Home Living Family/patient expects to be discharged to:: Assisted living                                             Frequency  Min 2X/week        Progress Toward Goals  OT Goals(current goals can now be found in the care plan section)  Progress towards OT goals: Progressing toward goals  Acute Rehab OT Goals Patient Stated Goal: to get better OT Goal Formulation: With patient Time For Goal Achievement: 05/02/22 Potential to Achieve Goals: Good  Plan Frequency remains appropriate;Discharge plan needs to be updated       AM-PAC OT "6 Clicks" Daily Activity     Outcome Measure   Help from another person eating meals?: None Help from another person taking care of personal grooming?: None Help from another person toileting, which includes using toliet, bedpan, or urinal?: None Help from another person bathing (including washing, rinsing, drying)?: None Help from another person to put on and taking off regular upper body clothing?: None Help from another person to put on and taking off regular lower body clothing?: None 6 Click Score: 24    End of Session    OT Visit Diagnosis: Unsteadiness on feet (R26.81)   Activity Tolerance Patient tolerated treatment well   Patient Left with call bell/phone within reach;in chair;with chair alarm set   Nurse Communication Mobility status        Time: 2774-1287 OT Time Calculation (min): 23 min  Charges: OT General Charges $OT Visit: 1 Visit OT Treatments $Self Care/Home Management : 8-22 mins $Therapeutic Activity: 8-22 mins  Darleen Crocker, MS, OTR/L , CBIS ascom [phone]  04/19/22, 4:15 PM

## 2022-04-19 NOTE — Progress Notes (Signed)
PT Cancellation Note  Patient Details Name: Samantha Carpenter MRN: [id number] DOB: May 28, 1940   Cancelled Treatment:    Reason Eval/Treat Not Completed: Patient declined, no reason specified Patient looking for her shoes, confused. Does not want to go walk right now. Will re-attempt at later time/date. (NT states she walked with her earlier.)   Devyn Griffing 04/19/2022, 2:15 PM

## 2022-04-19 NOTE — Progress Notes (Signed)
Pt has an episode of agitation at around 2300H. Pt was not combative but she will not stay in bed. Pt also tends to wander outside her room. PRN Haldol 2mg  tab given. Pt was able to sleep afterwards.

## 2022-04-19 NOTE — Progress Notes (Signed)
Progress Note   Patient: Samantha Carpenter XBM:[id number] DOB: 02-Apr-1940 DOA: 04/16/2022     3 DOS: the patient was seen and examined on 04/19/2022   Brief hospital course: Samantha Carpenter is a 82 y.o. female with medical history significant of dCHF, hypertension, hyperlipidemia COPD, dementia, depression with anxiety, who presented from SNF on 04/16/2022 for evaluation of shortness breath and leg edema.  Due to dementia, patient is unable to provide history, and attempts to reach family at time of admission were unsuccessful.  History in H&P taken from reports provided by facility staff and EMS.  Evaluation in the ED and physical exam are consistent with decompensated heart failure.  Chest xray with interstitial edema.  She was admitted to medicine service and started on IV diuresis.    04/19/2022: The patient was seen and examined at bedside.  There were no acute events overnight.  Her sodium level is improving 129.  Potential discharge to SNF tomorrow.  Assessment and Plan:  Acute on chronic diastolic CHF (congestive heart failure) ([place]) Echo on 12/07/2020 showed EF > 50% with grade 2 diastolic dysfunction.  Presented with 1+ LE edema, positive JVD, elevated BNP, interstitial edema on chest x-ray, clinically consistent with CHF exacerbation.   Due to hyponatremia with Na 122, we are correcting her sodium to a safe level before proceeding with diuresis, since her respiratory status is stable. Sodium level responded to diuresis. --Start IV Lasix 20 mg BID  --Monitor renal function, electrolytes --Monitor volume status - strict I/O's, daily weights  Hyponatremia, improving Na 122 on admission.  Hypotonic (serum osm 266). Seems acute, sodium was normal 139-140 in December. Started on fluid restriction and salt tablets on admission.    Na today 127 >> 124 on fluid restriction and salt tablets only.   1/30 Given diuresis and salt tabs stopped. 1/31 Na improved 124 >> 128. 04/19/2022 sodium improved to  129. Continue fluid restriction. --Continue diuresis as outlined --Monitor BMP closely --Stop salt tablets, they cause fluid retention --Mild elevation of TSH 4.727, add on free T4 --Low AM cortisol 1.3.  Check Cosyntropin stim test tomorrow.  HTN (hypertension) Hold Hyzaar due to hyponatremia. Hold beta blocker with bradycardia. Continue amlodipine PRN hydralazine  COPD (chronic obstructive pulmonary disease) ([place]) Not acutely exacerbated. --Bronchodilators  Dementia with behavioral disturbance (HCC) Continue Exelon, Seroquel, Cymbalta, Klonopin, Wellbutrin. PRN Haldol for agitation  Resolved hypothermia POA, resolved  Depression with anxiety Continue home meds (see dementia)  Resolved, post repletion hypokalemia        Physical Exam: Vitals:   04/19/22 0114 04/19/22 0439 04/19/22 0700 04/19/22 1123  BP: (!) 134/57 (!) 125/38 (!) 163/61 (!) 153/57  Pulse: 70 65 64 82  Resp: 20 16 17 18   Temp: 98.1 F (36.7 C) 97.7 F (36.5 C) 97.8 F (36.6 C) 97.8 F (36.6 C)  TempSrc: Oral Axillary    SpO2: 96% 100% 96% 96%  Weight:  71.8 kg     General exam: awake & alert, no acute distress HEENT: moist mucus membranes, hearing grossly normal  Respiratory system: CTAB diminished bases, no wheezes, rales or rhonchi, normal respiratory effort. Cardiovascular system: normal S1/S2, RRR, +JVD, improved BLE edema.   Gastrointestinal system: soft, NT, ND Central nervous system: grossly non-focal, normal speech, alert & oriented to self Extremities: moves all  no edema, normal tone Skin: dry, intact, normal temperature Psychiatry: normal mood & affect, abnormal judgement and insight due to dementia   Data Reviewed:  Notable labs -- -Na improved 124>>128, Cl 89,  glucose 102, Mg 1.6    AM cortisol low 1.3   Family Communication: None present. Will attempt to call.  Disposition: Status is: Inpatient Remains inpatient appropriate because: remains hyponatremic and volume  overloaded as above.    Planned Discharge Destination: Skilled nursing facility    Time spent: 46 minutes  Author: Kayleen Memos, DO 04/19/2022 3:37 PM  For on call review [email or web address].

## 2022-04-19 NOTE — Care Management Important Message (Signed)
Important Message  Patient Details  Name: Samantha Carpenter MRN: [id number] Date of Birth: 08/15/1940   Medicare Important Message Given:  Yes     Dannette Psalm 04/19/2022, 2:50 PM

## 2022-04-19 NOTE — Progress Notes (Signed)
Lab was unable to get 2nd blood draw to check for cortisol levels. Pt is confused and is refusing to get her blood drawn.

## 2022-04-20 ENCOUNTER — Other Ambulatory Visit (HOSPITAL_COMMUNITY): Payer: Self-pay

## 2022-04-20 DIAGNOSIS — I5033 Acute on chronic diastolic (congestive) heart failure: Secondary | ICD-10-CM | POA: Diagnosis not present

## 2022-04-20 LAB — BASIC METABOLIC PANEL
Anion gap: 12 (ref 5–15)
Anion gap: 9 (ref 5–15)
BUN: 25 mg/dL — ABNORMAL HIGH (ref 8–23)
BUN: 26 mg/dL — ABNORMAL HIGH (ref 8–23)
CO2: 29 mmol/L (ref 22–32)
CO2: 30 mmol/L (ref 22–32)
Calcium: 9 mg/dL (ref 8.9–10.3)
Calcium: 8.7 mg/dL — ABNORMAL LOW (ref 8.9–10.3)
Chloride: 87 mmol/L — ABNORMAL LOW (ref 98–111)
Chloride: 89 mmol/L — ABNORMAL LOW (ref 98–111)
Creatinine, Ser: 1.15 mg/dL — ABNORMAL HIGH (ref 0.44–1.00)
Creatinine, Ser: 1.05 mg/dL — ABNORMAL HIGH (ref 0.44–1.00)
GFR, Estimated: 47 mL/min — ABNORMAL LOW (ref >60)
GFR, Estimated: 53 mL/min — ABNORMAL LOW (ref >60)
Glucose, Bld: 130 mg/dL — ABNORMAL HIGH (ref 70–99)
Glucose, Bld: 128 mg/dL — ABNORMAL HIGH (ref 70–99)
Potassium: 4 mmol/L (ref 3.5–5.1)
Potassium: 4 mmol/L (ref 3.5–5.1)
Sodium: 128 mmol/L — ABNORMAL LOW (ref 135–145)
Sodium: 128 mmol/L — ABNORMAL LOW (ref 135–145)

## 2022-04-20 LAB — GLUCOSE, CAPILLARY: Glucose-Capillary: 104 mg/dL — ABNORMAL HIGH (ref 70–99)

## 2022-04-20 LAB — SODIUM: Sodium: 127 mmol/L — ABNORMAL LOW (ref 135–145)

## 2022-04-20 MED ORDER — SENNOSIDES-DOCUSATE SODIUM 8.6-50 MG PO TABS
1.0000 | ORAL_TABLET | Freq: Two times a day (BID) | ORAL | Status: DC
  Administered 2022-04-20 – 2022-04-22 (×6): 1 via ORAL
  Filled 2022-04-20 (×6): qty 1

## 2022-04-20 MED ORDER — SODIUM CHLORIDE 1 G PO TABS
1.0000 g | ORAL_TABLET | Freq: Two times a day (BID) | ORAL | Status: DC
  Administered 2022-04-20 – 2022-04-23 (×5): 1 g via ORAL
  Filled 2022-04-20 (×6): qty 1

## 2022-04-20 MED ORDER — SPIRONOLACTONE 25 MG PO TABS
25.0000 mg | ORAL_TABLET | Freq: Every day | ORAL | Status: DC
  Administered 2022-04-20 – 2022-04-23 (×4): 25 mg via ORAL
  Filled 2022-04-20 (×4): qty 1

## 2022-04-20 NOTE — Progress Notes (Signed)
Progress Note   Patient: Samantha Carpenter EUM:[id number] DOB: 1940-08-24 DOA: 04/16/2022     4 DOS: the patient was seen and examined on 04/20/2022   Brief hospital course: Delbra Zellars is a 82 y.o. female with medical history significant of dCHF, hypertension, hyperlipidemia COPD, dementia, depression with anxiety, who presented from SNF on 04/16/2022 for evaluation of shortness breath and leg edema.  Due to dementia, patient is unable to provide history, and attempts to reach family at time of admission were unsuccessful.  History in H&P taken from reports provided by facility staff and EMS.  Evaluation in the ED and physical exam are consistent with decompensated heart failure.  Chest xray with interstitial edema.  She was admitted to medicine service and started on IV diuresis.    04/20/2022: The patient was seen and examined at bedside.  She is alert and answering to questions appropriately.  Overnight confusion and a fall, hit the back of her head.  CT head was nonacute.  Surveyor, quantity ordered.  On fall precautions.  Repeated sodium 127 from 129, started salt tablet.  Assessment and Plan:  Acute on chronic diastolic CHF (congestive heart failure) ([place]) Echo on 12/07/2020 showed EF > 50% with grade 2 diastolic dysfunction.  Presented with 1+ LE edema, positive JVD, elevated BNP, interstitial edema on chest x-ray, clinically consistent with CHF exacerbation.   Due to hyponatremia with Na 122, we are correcting her sodium to a safe level before proceeding with diuresis, since her respiratory status is stable. Sodium level responded to diuresis. --Start IV Lasix 20 mg BID  --Monitor renal function, electrolytes --Monitor volume status - strict I/O's, daily weights  Hyponatremia, sodium level downtrending Na 122 on admission. Sodium 129, repeated 127 Start salt tablets 1 g twice daily Continue fluid restriction. --Continue diuresis as outlined --Monitor BMP closely  HTN (hypertension) BP  is at goal. Hold Hyzaar due to hyponatremia. Hold beta blocker with bradycardia. Continue amlodipine PRN hydralazine  COPD (chronic obstructive pulmonary disease) ([place]) Not acutely exacerbated. --Bronchodilators  Mild dementia with behavioral disturbance (HCC) Continue Exelon, Seroquel, Cymbalta, Klonopin, Wellbutrin. PRN Haldol for agitation Monitor QTc  Resolved hypothermia POA, resolved  Depression with anxiety Continue home meds (see dementia)  Resolved, post repletion hypokalemia        Physical Exam: Vitals:   04/20/22 0547 04/20/22 0813 04/20/22 1226 04/20/22 1527  BP: (!) 140/48 (!) 162/53 (!) 140/58 124/84  Pulse: 66 74 62 65  Resp: 16 17 18 17   Temp:  97.6 F (36.4 C) 97.6 F (36.4 C) 97.7 F (36.5 C)  TempSrc:  Oral Oral   SpO2: 98% 98% 94% 93%  Weight:       General exam: Well-developed well-nourished in no acute distress patient is alert and responds to questions appropriately. HEENT: moist mucus membranes, hearing grossly normal  Respiratory system: Clear to auscultation no wheezes no rales.  Cardiovascular system: Regular rate and rhythm no rubs or gallops.  Gastrointestinal system: Soft nontender normal bowel sounds present Central nervous system: grossly non-focal, normal speech, alert & oriented to self Extremities: moves all  no edema, normal tone Skin: dry, intact, normal temperature Psychiatry: Mood is appropriate for condition and setting.  Data Reviewed:  Notable labs -- -Na improved 124>>128, Cl 89, glucose 102, Mg 1.6    AM cortisol low 1.3   Family Communication: None present. Will attempt to call.  Disposition: Status is: Inpatient Remains inpatient appropriate because: remains hyponatremic and volume overloaded as above.    Planned Discharge  Destination: Skilled nursing facility    Time spent: 46 minutes  Author: Kayleen Memos, DO 04/20/2022 6:15 PM  For on call review [email or web address].

## 2022-04-20 NOTE — Progress Notes (Signed)
       CROSS COVER NOTE  NAME: Samantha Carpenter MRN: [id number] DOB : [birth year]     HPI/Events of Note   Overnight even patient with dementia, chf, depression/anxiety admitted from SNF with SOB and LE edema on multiple antipsychotics antianxiety meds including benzos became very agitated and combative/ aggressive requiring security to be called. Did fall and braised hed on wall with minor injury  Assessment and  Interventions   Assessment: Patient post sedation calm  Pain behaviors with head palpated but skin break not noted. NO are of hematoma or bogginess Head CT negative for acute findings Plan: Sitter Fall precautions Environmental delirium prevention/treatment measures       Kathlene Cote NP [facility]

## 2022-04-20 NOTE — TOC Benefit Eligibility Note (Signed)
Patient Teacher, English as a foreign language completed.    The patient is currently admitted and upon discharge could be taking Farxiga 10 mg.  The current 30 day co-pay is $62.00 Can use copay card.   The patient is insured through [place], [place] Patient Advocate Specialist [place] Patient Advocate Team Direct Number: [phone]  Fax: [phone]

## 2022-04-20 NOTE — Progress Notes (Signed)
PT Cancellation Note  Patient Details Name: Samantha Carpenter MRN: [id number] DOB: Oct 29, 1940   Cancelled Treatment:    Reason Eval/Treat Not Completed: Patient declined, no reason specified [name]. Offered to come back after her show finished, she still refused. Will continue to follow.   Rue Tinnel 04/20/2022, 3:04 PM

## 2022-04-20 NOTE — Plan of Care (Signed)
  Problem: Education: Goal: Ability to demonstrate management of disease process will improve Outcome: Progressing Goal: Ability to verbalize understanding of medication therapies will improve Outcome: Progressing Goal: Individualized Educational Video(s) Outcome: Progressing   Problem: Activity: Goal: Capacity to carry out activities will improve Outcome: Progressing   Problem: Cardiac: Goal: Ability to achieve and maintain adequate cardiopulmonary perfusion will improve Outcome: Progressing   Problem: Education: Goal: Knowledge of disease or condition will improve Outcome: Progressing Goal: Knowledge of the prescribed therapeutic regimen will improve Outcome: Progressing Goal: Individualized Educational Video(s) Outcome: Progressing   Problem: Activity: Goal: Ability to tolerate increased activity will improve Outcome: Progressing Goal: Will verbalize the importance of balancing activity with adequate rest periods Outcome: Progressing   Problem: Respiratory: Goal: Ability to maintain a clear airway will improve Outcome: Progressing Goal: Levels of oxygenation will improve Outcome: Progressing Goal: Ability to maintain adequate ventilation will improve Outcome: Progressing   Problem: Education: Goal: Knowledge of General Education information will improve Description: Including pain rating scale, medication(s)/side effects and non-pharmacologic comfort measures Outcome: Progressing

## 2022-04-20 NOTE — TOC Initial Note (Signed)
Transition of Care [facility]) - Initial/Assessment Note    Patient Details  Name: Samantha Carpenter MRN: [id number] Date of Birth: October 27, 1940  Transition of Care [facility]) CM/SW Contact:    Laurena Slimmer, RN Phone Number: 04/20/2022, 2:21 PM  Clinical Narrative:                 Patient is  from [facility]. PT recommendation for [facility].  Called Dustin at the [employer] to confirm preference for [facility] agencies. Facility is contracted with [facility]. Referral sent to [facility] from [facility].  RW requested via [employer].         Patient Goals and CMS Choice            Expected Discharge Plan and Services                                              Prior Living Arrangements/Services                       Activities of Daily Living      Permission Sought/Granted                  Emotional Assessment              Admission diagnosis:  Acute hyponatremia [E87.1] Acute on chronic diastolic CHF (congestive heart failure) ([place]) [I50.33] Patient Active Problem List   Diagnosis Date Noted   Acute on chronic diastolic CHF (congestive heart failure) ([place]) 04/16/2022   Hyponatremia 04/16/2022   Hypothermia 04/16/2022   Hypomagnesemia 02/15/2022   RSV (respiratory syncytial virus pneumonia) 02/15/2022   COPD with acute exacerbation ([place]) 02/10/2022   Hypokalemia 02/10/2022   Falls frequently 02/10/2022   Hypoglycemia 01/11/2022   AKI (acute kidney injury) ([place]) 01/10/2022   Pressure injury of skin 01/09/2022   COPD exacerbation ([place]) 01/08/2022   Prediabetes 01/08/2022   Dementia with behavioral disturbance ([place]) 11/15/2021   Hypoxia 11/15/2021   COVID-19 virus infection 11/14/2021   Right rib fracture 11/09/2020   Fall at home, initial encounter 11/09/2020   COPD (chronic obstructive pulmonary disease) ([place]) 11/09/2020   Depression with anxiety 11/09/2020   Elevated troponin 11/09/2020   Acute respiratory failure with hypoxia ([place]) 11/09/2020    HTN (hypertension) 11/09/2020   Abrasions of multiple sites    Pure hypercholesterolemia 06/09/2018   Recurrent major depressive disorder, in partial remission ([place]) 04/28/2018   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 [place] [address] Phone: [phone] Fax: [phone]     Social Determinants of Health (SDOH) Social History: SDOH Screenings   Food Insecurity: No Food Insecurity (09/02/2018)  Transportation Needs: No Transportation Needs (02/15/2022)  Financial Resource Strain: Low Risk  (09/02/2018)  Physical Activity: Sufficiently Active (09/02/2018)  Social Connections: Unknown (09/02/2018)  Stress: No Stress Concern Present (09/02/2018)  Tobacco Use: Medium Risk (04/16/2022)   SDOH Interventions:     Readmission Risk Interventions  01/11/2022    2:05 PM 01/09/2022    2:23 PM  Readmission Risk Prevention Plan  Transportation Screening  Complete  HRI or Home Care Consult Complete   Palliative Care Screening  Not Applicable  Medication Review (RN Care Manager)  Complete

## 2022-04-21 LAB — BASIC METABOLIC PANEL
Anion gap: 8 (ref 5–15)
BUN: 21 mg/dL (ref 8–23)
CO2: 31 mmol/L (ref 22–32)
Calcium: 8.9 mg/dL (ref 8.9–10.3)
Chloride: 90 mmol/L — ABNORMAL LOW (ref 98–111)
Creatinine, Ser: 0.86 mg/dL (ref 0.44–1.00)
GFR, Estimated: >60 mL/min (ref >60)
Glucose, Bld: 99 mg/dL (ref 70–99)
Potassium: 4 mmol/L (ref 3.5–5.1)
Sodium: 129 mmol/L — ABNORMAL LOW (ref 135–145)

## 2022-04-21 LAB — CBC
HCT: 32.8 % — ABNORMAL LOW (ref 36.0–46.0)
Hemoglobin: 11 g/dL — ABNORMAL LOW (ref 12.0–15.0)
MCH: 30.2 pg (ref 26.0–34.0)
MCHC: 33.5 g/dL (ref 30.0–36.0)
MCV: 90.1 fL (ref 80.0–100.0)
Platelets: 230 10*3/uL (ref 150–400)
RBC: 3.64 MIL/uL — ABNORMAL LOW (ref 3.87–5.11)
RDW: 13.2 % (ref 11.5–15.5)
WBC: 4.6 10*3/uL (ref 4.0–10.5)
nRBC: 0 % (ref 0.0–0.2)

## 2022-04-21 LAB — GLUCOSE, CAPILLARY
Glucose-Capillary: 105 mg/dL — ABNORMAL HIGH (ref 70–99)
Glucose-Capillary: 181 mg/dL — ABNORMAL HIGH (ref 70–99)

## 2022-04-21 MED ORDER — HYDRALAZINE HCL 20 MG/ML IJ SOLN
10.0000 mg | Freq: Four times a day (QID) | INTRAMUSCULAR | Status: DC | PRN
  Administered 2022-04-21 (×2): 10 mg via INTRAVENOUS
  Filled 2022-04-21 (×2): qty 1

## 2022-04-21 MED ORDER — ACETAMINOPHEN 325 MG PO TABS
650.0000 mg | ORAL_TABLET | Freq: Four times a day (QID) | ORAL | Status: DC | PRN
  Administered 2022-04-21 – 2022-04-22 (×2): 650 mg via ORAL
  Filled 2022-04-21 (×2): qty 2

## 2022-04-21 MED ORDER — AMLODIPINE BESYLATE 10 MG PO TABS
10.0000 mg | ORAL_TABLET | Freq: Every day | ORAL | Status: DC
  Administered 2022-04-21 – 2022-04-23 (×3): 10 mg via ORAL
  Filled 2022-04-21 (×4): qty 1

## 2022-04-21 NOTE — Progress Notes (Signed)
. Progress Note   PatientBuna Carpenter VOH:[id number] DOB: 26-Jan-1941 DOA: 04/16/2022     5 DOS: the patient was seen and examined on 04/21/2022   Brief hospital course: Samantha Carpenter is a 82 y.o. female with medical history significant of dCHF, hypertension, hyperlipidemia COPD, dementia, depression with anxiety, who presented from SNF on 04/16/2022 for evaluation of shortness breath and leg edema.  Due to dementia, patient is unable to provide history, and attempts to reach family at time of admission were unsuccessful.  History in H&P taken from reports provided by facility staff and EMS.  Evaluation in the ED and physical exam are consistent with decompensated heart failure.  Chest xray with interstitial edema.  She was admitted to medicine service and started on IV diuresis.    04/20/2022: The patient was seen and examined at bedside.  She is alert and answering to questions appropriately.  Overnight confusion and a fall, hit the back of her head.  CT head was nonacute.  Surveyor, quantity ordered.  On fall precautions.  Repeated sodium 127 from 129, started salt tablet.  2/3 : Patient seen and examined this morning.  No overnight events.  Patient continues to stay on 2 L nasal cannula saturating 93%.  Labs with improving sodium 128-128-129.  Patient continues to stay on salt tablets.  Improved creatinine today.  Assessment and Plan:  Acute on chronic diastolic CHF (congestive heart failure)   Echo on 12/07/2020 showed EF > 50% with grade 2 diastolic dysfunction.  Presented with 1+ LE edema, positive JVD, elevated BNP, interstitial edema on chest x-ray, clinically consistent with CHF exacerbation.   Due to hyponatremia with Na 122, we are correcting her sodium to a safe level before proceeding with diuresis, since her respiratory status is stable. Sodium level responded to diuresis. -- Continue IV Lasix 20 mg BID  --Monitor renal function, electrolytes --Monitor volume status - strict I/O's,  daily weights patient still positive volume status.  Hyponatremia, sodium level downtrending Na 122 on admission. Sodium 129, repeated 127 today 129. Start salt tablets 1 g twice daily Continue fluid restriction. --Continue diuresis as outlined --Monitor BMP closely  HTN (hypertension) BP is at goal. Hold Hyzaar due to hyponatremia. Hold beta blocker with bradycardia. Continue amlodipine PRN hydralazine  COPD (chronic obstructive pulmonary disease) ([place]) Not acutely exacerbated. --Bronchodilators  Mild dementia with behavioral disturbance (HCC) Continue Exelon, Seroquel, Cymbalta, Klonopin, Wellbutrin. PRN Haldol for agitation Monitor QTc  Resolved hypothermia POA, resolved  Depression with anxiety Continue home meds (see dementia)  Resolved, post repletion hypokalemia        Physical Exam: Vitals:   04/21/22 0404 04/21/22 0511 04/21/22 0751 04/21/22 1125  BP:  (!) 185/70 (!) 160/57 123/62  Pulse:  81 76 79  Resp:   18 18  Temp:  99.6 F (37.6 C) 97.9 F (36.6 C) 98 F (36.7 C)  TempSrc:  Oral    SpO2:  97% 93% 92%  Weight: 65.3 kg      General exam: Well-developed well-nourished in no acute distress patient is alert and responds to questions appropriately. HEENT: moist mucus membranes, hearing grossly normal  Respiratory system: Clear to auscultation no wheezes no rales.  Cardiovascular system: Regular rate and rhythm no rubs or gallops.  Gastrointestinal system: Soft nontender normal bowel sounds present Central nervous system: grossly non-focal, normal speech, alert & oriented to self Extremities: moves all  no edema, normal tone Skin: dry, intact, normal temperature Psychiatry: Mood is appropriate for condition and setting.  Data Reviewed:  Notable labs -- -Na improved 124>>128-129, Cl 89, glucose 102, Mg 1.6    AM cortisol low 1.3   Family Communication: None present. Will attempt to call.  Disposition: Status is: Inpatient Remains inpatient  appropriate because: remains hyponatremic and volume overloaded as above.    Planned Discharge Destination: Skilled nursing facility    Time spent: 31 min  Author: Oran Rein, MD 04/21/2022 2:23 PM  For on call review [email or web address].

## 2022-04-22 LAB — GLUCOSE, CAPILLARY: Glucose-Capillary: 94 mg/dL (ref 70–99)

## 2022-04-22 NOTE — Progress Notes (Signed)
.. Progress Note   Patient: Samantha Carpenter WUJ:[id number] DOB: 04/24/1940 DOA: 04/16/2022     6 DOS: the patient was seen and examined on 04/22/2022   Brief hospital course: Denisia Harpole is a 82 y.o. female with medical history significant of dCHF, hypertension, hyperlipidemia COPD, dementia, depression with anxiety, who presented from SNF on 04/16/2022 for evaluation of shortness breath and leg edema.  Due to dementia, patient is unable to provide history, and attempts to reach family at time of admission were unsuccessful.  History in H&P taken from reports provided by facility staff and EMS.  Evaluation in the ED and physical exam are consistent with decompensated heart failure.  Chest xray with interstitial edema.  She was admitted to medicine service and started on IV diuresis.    04/20/2022: The patient was seen and examined at bedside.  She is alert and answering to questions appropriately.  Overnight confusion and a fall, hit the back of her head.  CT head was nonacute.  Surveyor, quantity ordered.  On fall precautions.  Repeated sodium 127 from 129, started salt tablet.  2/3 : Patient seen and examined this morning.  No overnight events.  Patient continues to stay on 2 L nasal cannula saturating 93%.  Labs with improving sodium 128-128-129.  Patient continues to stay on salt tablets.  Improved creatinine today.  2/4 : Patient seen and examined this morning eating breakfast.  No overnight events.  Feeling depressed.  Continues to stay on 2 L saturating 94 to 95%.  Input output is net positive.  Patient in no respiratory distress dispo to skilled nursing facility by tomorrow.  Assessment and Plan:  Acute on chronic diastolic CHF (congestive heart failure)   Echo on 12/07/2020 showed EF > 50% with grade 2 diastolic dysfunction.  Presented with 1+ LE edema, positive JVD, elevated BNP, interstitial edema on chest x-ray, clinically consistent with CHF exacerbation.   Due to hyponatremia with Na 122,  we are correcting her sodium to a safe level before proceeding with diuresis, since her respiratory status is stable. Sodium level responded to diuresis. -- Continue IV Lasix 20 mg BID  --Monitor renal function, electrolytes --Monitor volume status - strict I/O's, daily weights patient still positive volume status.  Hyponatremia, sodium level downtrending Na 122 on admission. Sodium 129, repeated 127 today 129. Start salt tablets 1 g twice daily Continue fluid restriction. --Continue diuresis as outlined --Monitor BMP closely  HTN (hypertension) BP is at goal. Hold Hyzaar due to hyponatremia. Hold beta blocker with bradycardia. Continue amlodipine PRN hydralazine  COPD (chronic obstructive pulmonary disease) ([place]) Not acutely exacerbated. --Bronchodilators  Mild dementia with behavioral disturbance (HCC) Continue Exelon, Seroquel, Cymbalta, Klonopin, Wellbutrin. PRN Haldol for agitation Monitor QTc  Resolved hypothermia POA, resolved  Depression with anxiety Continue home meds (see dementia)  Resolved, post repletion hypokalemia        Physical Exam: Vitals:   04/22/22 0029 04/22/22 0334 04/22/22 0736 04/22/22 0800  BP: (!) 143/53 (!) 159/61 (!) 170/52   Pulse: 74 73 73   Resp: 18 19 16    Temp: 98.1 F (36.7 C)   98 F (36.7 C)  TempSrc: Oral   Oral  SpO2: 100% 99% 96%   Weight:       General exam: Well-developed well-nourished in no acute distress patient is alert and responds to questions appropriately. HEENT: moist mucus membranes, hearing grossly normal  Respiratory system: Clear to auscultation no wheezes no rales.   Cardiovascular system: Regular rate and rhythm no  rubs or gallops.   Gastrointestinal system: Soft nontender normal bowel sounds present  Central nervous system: grossly non-focal, normal speech, alert & oriented to self Extremities: moves all  no edema, normal tone Skin: dry, intact, normal temperature Psychiatry: Mood is appropriate  for condition and setting.  Data Reviewed:  Notable labs -- -Na improved 124>>128-129, Cl 89, glucose 102, Mg 1.6    AM cortisol low 1.3   Family Communication: None present. Will attempt to call.  Disposition: Status is: Inpatient Remains inpatient appropriate because: remains hyponatremic and volume overloaded as above.    Planned Discharge Destination: Skilled nursing facility    Time spent: 31 min  Author: Oran Rein, MD 04/22/2022 11:46 AM  For on call review [email or web address].

## 2022-04-23 DIAGNOSIS — E871 Hypo-osmolality and hyponatremia: Principal | ICD-10-CM

## 2022-04-23 DIAGNOSIS — I5033 Acute on chronic diastolic (congestive) heart failure: Secondary | ICD-10-CM | POA: Diagnosis not present

## 2022-04-23 LAB — COMPREHENSIVE METABOLIC PANEL
ALT: 14 U/L (ref 0–44)
AST: 22 U/L (ref 15–41)
Albumin: 3.9 g/dL (ref 3.5–5.0)
Alkaline Phosphatase: 63 U/L (ref 38–126)
Anion gap: 12 (ref 5–15)
BUN: 31 mg/dL — ABNORMAL HIGH (ref 8–23)
CO2: 30 mmol/L (ref 22–32)
Calcium: 9.2 mg/dL (ref 8.9–10.3)
Chloride: 90 mmol/L — ABNORMAL LOW (ref 98–111)
Creatinine, Ser: 1.21 mg/dL — ABNORMAL HIGH (ref 0.44–1.00)
GFR, Estimated: 44 mL/min — ABNORMAL LOW (ref >60)
Glucose, Bld: 103 mg/dL — ABNORMAL HIGH (ref 70–99)
Potassium: 3.8 mmol/L (ref 3.5–5.1)
Sodium: 132 mmol/L — ABNORMAL LOW (ref 135–145)
Total Bilirubin: 0.6 mg/dL (ref 0.3–1.2)
Total Protein: 6.5 g/dL (ref 6.5–8.1)

## 2022-04-23 LAB — GLUCOSE, CAPILLARY
Glucose-Capillary: 115 mg/dL — ABNORMAL HIGH (ref 70–99)
Glucose-Capillary: 237 mg/dL — ABNORMAL HIGH (ref 70–99)

## 2022-04-23 MED ORDER — SODIUM CHLORIDE 1 G PO TABS: 1.0000 g | ORAL_TABLET | Freq: Two times a day (BID) | ORAL | Status: DC

## 2022-04-23 MED ORDER — FUROSEMIDE 40 MG PO TABS: 40.0000 mg | ORAL_TABLET | Freq: Every day | ORAL | Status: DC

## 2022-04-23 MED ORDER — SPIRONOLACTONE 25 MG PO TABS: 25.0000 mg | ORAL_TABLET | Freq: Every day | ORAL | Status: DC

## 2022-04-23 NOTE — NC FL2 (Signed)
[place] LEVEL OF CARE FORM     IDENTIFICATION  Patient Name: Samantha Carpenter Birthdate: Sep 09, 1940 Sex: female Admission Date (Current Location): 04/16/2022  [place] and [place] Number:  Engineering geologist and Address:  [facility], [address], [place], [place] [address]      Provider Number: [id number]  Attending Physician Name and Address:  Richarda Osmond, MD  Relative Name and Phone Number:  Baxter Hire, [phone]    Current Level of Care: Hospital Recommended Level of Care: [place] Prior Approval Number:    Date Approved/Denied:   PASRR Number:    Discharge Plan: Other (Comment) (ALF)    Current Diagnoses: Patient Active Problem List   Diagnosis Date Noted   Acute hyponatremia 04/23/2022   Acute on chronic diastolic CHF (congestive heart failure) ([place]) 04/16/2022   Hyponatremia 04/16/2022   Hypothermia 04/16/2022   Hypomagnesemia 02/15/2022   RSV (respiratory syncytial virus pneumonia) 02/15/2022   COPD with acute exacerbation ([place]) 02/10/2022   Hypokalemia 02/10/2022   Falls frequently 02/10/2022   Hypoglycemia 01/11/2022   AKI (acute kidney injury) ([place]) 01/10/2022   Pressure injury of skin 01/09/2022   COPD exacerbation ([place]) 01/08/2022   Prediabetes 01/08/2022   Dementia with behavioral disturbance ([place]) 11/15/2021   Hypoxia 11/15/2021   COVID-19 virus infection 11/14/2021   Right rib fracture 11/09/2020   Fall at home, initial encounter 11/09/2020   COPD (chronic obstructive pulmonary disease) ([place]) 11/09/2020   Depression with anxiety 11/09/2020   Elevated troponin 11/09/2020   Acute respiratory failure with hypoxia ([place]) 11/09/2020   HTN (hypertension) 11/09/2020   Abrasions of multiple sites    Pure hypercholesterolemia 06/09/2018   Recurrent major depressive disorder, in partial remission ([place]) 04/28/2018   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/28/2018   HTN, goal below 140/80  04/28/2018   CKD (chronic kidney disease) stage 3, GFR 30-59 ml/min (HCC) 04/28/2018    Orientation RESPIRATION BLADDER Height & Weight     Self, Time  Normal External catheter Weight: 67.6 kg Height:     BEHAVIORAL SYMPTOMS/MOOD NEUROLOGICAL BOWEL NUTRITION STATUS     (n/a) Continent Diet  AMBULATORY STATUS COMMUNICATION OF NEEDS Skin   Limited Assist Verbally Bruising                       Personal Care Assistance Level of Assistance  Bathing, Feeding, Dressing Bathing Assistance: Limited assistance Feeding assistance: Limited assistance Dressing Assistance: Limited assistance     Functional Limitations Info             SPECIAL CARE FACTORS FREQUENCY  PT (By licensed PT), OT (By licensed OT) (Ecchymosis, arm and hand)                    Contractures Contractures Info: Not present    Additional Factors Info  Code Status, Allergies Code Status Info: DNR Allergies Info: Morphine and related, Latex           Current Medications (04/23/2022):  This is the current hospital active medication list TAKE these medications     Acetaminophen Extra Strength 500 MG Tabs Take 1 tablet by mouth every 4 (four) hours as needed (pain).    albuterol 108 (90 Base) MCG/ACT inhaler Commonly known as: VENTOLIN HFA Inhale 2 puffs into the lungs every 6 (six) hours as needed for wheezing or shortness of breath.    amLODipine 10 MG tablet Commonly known as: NORVASC Take 1 tablet (  10 mg total) by mouth daily.    aspirin EC 81 MG tablet Take 81 mg by mouth daily.    bisoprolol 5 MG tablet Commonly known as: ZEBETA Take 1 tablet (5 mg total) by mouth daily.    buPROPion 150 MG 24 hr tablet Commonly known as: WELLBUTRIN XL Take 150 mg by mouth daily.    clonazePAM 1 MG tablet Commonly known as: KLONOPIN Take 1 tablet (1 mg total) by mouth 2 (two) times daily.    DULoxetine 60 MG capsule Commonly known as: CYMBALTA Take 60 mg by mouth daily.     fluticasone-salmeterol 250-50 MCG/ACT Aepb Commonly known as: ADVAIR Inhale 1 puff into the lungs in the morning and at bedtime. What changed: additional instructions    furosemide 40 MG tablet Commonly known as: LASIX Take 1 tablet (40 mg total) by mouth daily. Start taking on: April 24, 2022    gabapentin 100 MG capsule Commonly known as: NEURONTIN Take 100 mg by mouth 2 (two) times daily.    haloperidol 2 MG tablet Commonly known as: HALDOL Take 1 tablet (2 mg total) by mouth every 6 (six) hours as needed for agitation.    ipratropium-albuterol 0.5-2.5 (3) MG/3ML Soln Commonly known as: DUONEB Take 3 mLs by nebulization in the morning, at noon, and at bedtime.    OXcarbazepine 150 MG tablet Commonly known as: TRILEPTAL Take 150-300 mg by mouth See admin instructions. Take 150 mg (1 tablet) by mouth every morning, and 300 mg (2 tablets) at bedtime.    polyethylene glycol 17 g packet Commonly known as: MIRALAX / GLYCOLAX Take 17 g by mouth daily.    QUEtiapine 50 MG tablet Commonly known as: SEROQUEL Take 1 tablet (50 mg total) by mouth at bedtime.    rivastigmine 1.5 MG capsule Commonly known as: EXELON Take 1.5 mg by mouth 2 (two) times daily.    sodium chloride 1 g tablet Take 1 tablet (1 g total) by mouth 2 (two) times daily with a meal.    Spiriva Respimat 2.5 MCG/ACT Aers Generic drug: Tiotropium Bromide Monohydrate Inhale 2 puffs into the lungs daily.    spironolactone 25 MG tablet Commonly known as: ALDACTONE Take 1 tablet (25 mg total) by mouth daily. Start taking on: April 24, 2022         Discharge Medications: Please see discharge summary for a list of discharge medications.  Relevant Imaging Results:  Relevant Lab Results:   Additional Information SS# [id number]  Laurena Slimmer, RN

## 2022-04-23 NOTE — Discharge Summary (Signed)
Physician Discharge Summary  Patient: Samantha Carpenter QVZ:[id number] DOB: 10/25/1940   Code Status: DNR Admit date: 04/16/2022 Discharge date: 04/23/2022 Disposition: Skilled nursing facility, PT, OT, nurse aid, and RN PCP: Pcp, No  Recommendations for Outpatient Follow-up:  Follow up with PCP within 1-2 weeks Regarding general hospital follow up and preventative care Recommend following metabolic panel to monitor renal function and electrolytes on diuretics. Cr increased to 1.2 on day of discharge which was not AKI from baseline about 1. Na+ and K+ were WNL. Continued on spironolactone and sodium tablets as listed below. Follow up with cardiology Regarding advanced heart failure  Discharge Diagnoses:  Principal Problem:   Acute on chronic diastolic CHF (congestive heart failure) ([place]) Active Problems:   Hyponatremia   HTN (hypertension)   COPD (chronic obstructive pulmonary disease) (HCC)   Dementia with behavioral disturbance ([place])   Hypothermia   Depression with anxiety   Hypokalemia  Brief Hospital Course Summary: Samantha Carpenter is a 82 y.o. female with medical history significant of dCHF, hypertension, hyperlipidemia COPD, dementia, depression with anxiety, who presented from SNF on 04/16/2022 for evaluation of shortness breath and leg edema.  Due to dementia, patient is unable to provide history, and attempts to reach family at time of admission were unsuccessful.  History in H&P taken from reports provided by facility staff and EMS.   Evaluation in the ED and physical exam are consistent with decompensated heart failure.  Chest xray with interstitial edema.  She was admitted to medicine service and started on IV diuresis.   1/31: echo showed EF 60-65%. g1dd 2/2: The patient was seen and examined at bedside.  She is alert and answering to questions appropriately.  Overnight confusion and a fall, hit the back of her head.  CT head was nonacute. Likely secondary to hyponatremia in  setting of diuresis. started salt tablet. 2/3: remains on 2L [place] and continues to diurese. Na+ improving.  2/4: weaned to room air 2/5: remains stable ORA. Baseline mental status. Na+ WNL while on diuretics and salt tablet supplementation. Also added spironolactone for better BP control as well as improve potassium which is currently WNL.Euvolemic on exam.  She is stable to return to SNF with PT/OT.   Discharge Condition: Good, improved Recommended discharge diet: Regular healthy diet  Consultations: None   Procedures/Studies: echo  Allergies as of 04/23/2022       Reactions   Morphine And Related    Latex Rash   "I break out"        Medication List     STOP taking these medications    losartan-hydrochlorothiazide 100-12.5 MG tablet Commonly known as: HYZAAR       TAKE these medications    Acetaminophen Extra Strength 500 MG Tabs Take 1 tablet by mouth every 4 (four) hours as needed (pain).   albuterol 108 (90 Base) MCG/ACT inhaler Commonly known as: VENTOLIN HFA Inhale 2 puffs into the lungs every 6 (six) hours as needed for wheezing or shortness of breath.   amLODipine 10 MG tablet Commonly known as: NORVASC Take 1 tablet (10 mg total) by mouth daily.   aspirin EC 81 MG tablet Take 81 mg by mouth daily.   bisoprolol 5 MG tablet Commonly known as: ZEBETA Take 1 tablet (5 mg total) by mouth daily.   buPROPion 150 MG 24 hr tablet Commonly known as: WELLBUTRIN XL Take 150 mg by mouth daily.   clonazePAM 1 MG tablet Commonly known as: KLONOPIN Take 1 tablet (1  mg total) by mouth 2 (two) times daily.   DULoxetine 60 MG capsule Commonly known as: CYMBALTA Take 60 mg by mouth daily.   fluticasone-salmeterol 250-50 MCG/ACT Aepb Commonly known as: ADVAIR Inhale 1 puff into the lungs in the morning and at bedtime. What changed: additional instructions   furosemide 40 MG tablet Commonly known as: LASIX Take 1 tablet (40 mg total) by mouth daily. Start  taking on: April 24, 2022   gabapentin 100 MG capsule Commonly known as: NEURONTIN Take 100 mg by mouth 2 (two) times daily.   haloperidol 2 MG tablet Commonly known as: HALDOL Take 1 tablet (2 mg total) by mouth every 6 (six) hours as needed for agitation.   ipratropium-albuterol 0.5-2.5 (3) MG/3ML Soln Commonly known as: DUONEB Take 3 mLs by nebulization in the morning, at noon, and at bedtime.   OXcarbazepine 150 MG tablet Commonly known as: TRILEPTAL Take 150-300 mg by mouth See admin instructions. Take 150 mg (1 tablet) by mouth every morning, and 300 mg (2 tablets) at bedtime.   polyethylene glycol 17 g packet Commonly known as: MIRALAX / GLYCOLAX Take 17 g by mouth daily.   QUEtiapine 50 MG tablet Commonly known as: SEROQUEL Take 1 tablet (50 mg total) by mouth at bedtime.   rivastigmine 1.5 MG capsule Commonly known as: EXELON Take 1.5 mg by mouth 2 (two) times daily.   sodium chloride 1 g tablet Take 1 tablet (1 g total) by mouth 2 (two) times daily with a meal.   Spiriva Respimat 2.5 MCG/ACT Aers Generic drug: Tiotropium Bromide Monohydrate Inhale 2 puffs into the lungs daily.   spironolactone 25 MG tablet Commonly known as: ALDACTONE Take 1 tablet (25 mg total) by mouth daily. Start taking on: April 24, 2022               Durable Medical Equipment  (From admission, onward)           Start     Ordered   04/20/22 [phone]  For home use only DME Walker rolling  Once       Question Answer Comment  Walker: With 5 Inch Wheels   Patient needs a walker to treat with the following condition Weakness generalized      04/20/22 0943             Subjective   Pt reports feeling well. She denies LE swelling, SOB. She is able to transfer and ambulate to bathroom independently. She would like to go home as she has no clean clothes here.   All questions and concerns were addressed at time of discharge.  Objective  Blood pressure (!) 158/79, pulse  87, temperature 97.7 F (36.5 C), resp. rate (!) 22, weight 67.6 kg, SpO2 97 %.   General: Pt is alert, awake, not in acute distress Cardiovascular: RRR, S1/S2 +, no rubs, no gallops, extremities well perfused. Neg JVD.  Respiratory: CTA bilaterally, no wheezing, no rhonchi Abdominal: Soft, NT, ND, bowel sounds + Extremities: no edema, no cyanosis  The results of significant diagnostics from this hospitalization (including imaging, microbiology, ancillary and laboratory) are listed below for reference.   Imaging studies: CT HEAD WO CONTRAST (5MM)  Result Date: 04/20/2022 CLINICAL DATA:  Head trauma EXAM: CT HEAD WITHOUT CONTRAST TECHNIQUE: Contiguous axial images were obtained from the base of the skull through the vertex without intravenous contrast. RADIATION DOSE REDUCTION: This exam was performed according to the departmental dose-optimization program which includes automated exposure control, adjustment of the  mA and/or kV according to patient size and/or use of iterative reconstruction technique. COMPARISON:  03/08/2022 FINDINGS: Brain: There is no mass, hemorrhage or extra-axial collection. The size and configuration of the ventricles and extra-axial CSF spaces are normal. There is hypoattenuation of the white matter, most commonly indicating chronic small vessel disease. Vascular: Atherosclerotic calcification of the internal carotid arteries at the skull base. No abnormal hyperdensity of the major intracranial arteries or dural venous sinuses. Skull: The visualized skull base, calvarium and extracranial soft tissues are normal. Sinuses/Orbits: No fluid levels or advanced mucosal thickening of the visualized paranasal sinuses. No mastoid or middle ear effusion. The orbits are normal. IMPRESSION: 1. No acute intracranial abnormality. 2. Chronic small vessel disease. Electronically Signed   By: Ulyses Jarred M.D.   On: 04/20/2022 00:59   ECHOCARDIOGRAM COMPLETE  Result Date: 04/18/2022     ECHOCARDIOGRAM REPORT   Patient Name:   GORDANA KEWLEY Date of Exam: 04/18/2022 Medical Rec #:  [id number]     Height:       62.0 in Accession #:    [id number]    Weight:       157.2 lb Date of Birth:  January 25, 1941    BSA:          1.726 m Patient Age:    70 years      BP:           148/60 mmHg Patient Gender: F             HR:           63 bpm. 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                  COPD; Risk Factors:Hypertension. CKD, dementia.  Sonographer:     Sherrie Sport Referring Phys:  [id number] KELLY A GRIFFITH Diagnosing Phys: Isaias Cowman MD  Sonographer Comments: Technically challenging study due to limited acoustic windows, no parasternal window and suboptimal apical window. Image acquisition challenging due to COPD. IMPRESSIONS  1. Left ventricular ejection fraction, by estimation, is 60 to 65%. The left ventricle has normal function. The left ventricle has no regional wall motion abnormalities. Left ventricular diastolic parameters are consistent with Grade I diastolic dysfunction (impaired relaxation).  2. Right ventricular systolic function is normal. The right ventricular size is normal.  3. The mitral valve is normal in structure. Mild mitral valve regurgitation. No evidence of mitral stenosis.  4. The aortic valve is normal in structure. Aortic valve regurgitation is not visualized. No aortic stenosis is present.  5. The inferior vena cava is normal in size with greater than 50% respiratory variability, suggesting right atrial pressure of 3 mmHg. FINDINGS  Left Ventricle: Left ventricular ejection fraction, by estimation, is 60 to 65%. The left ventricle has normal function. The left ventricle has no regional wall motion abnormalities. The left ventricular internal cavity size was normal in size. There is  no left ventricular  hypertrophy. Left ventricular diastolic parameters are consistent with Grade I diastolic dysfunction (impaired relaxation). Right Ventricle: The right ventricular size is normal. No increase in right ventricular wall thickness. Right ventricular systolic function is normal. Left Atrium: Left atrial size was normal in size. Right Atrium: Right atrial size was normal in size. Pericardium: There is no evidence of pericardial effusion.  Mitral Valve: The mitral valve is normal in structure. Mild mitral valve regurgitation. No evidence of mitral valve stenosis. Tricuspid Valve: The tricuspid valve is normal in structure. Tricuspid valve regurgitation is mild . No evidence of tricuspid stenosis. Aortic Valve: The aortic valve is normal in structure. Aortic valve regurgitation is not visualized. No aortic stenosis is present. Aortic valve mean gradient measures 4.0 mmHg. Aortic valve peak gradient measures 6.9 mmHg. Aortic valve area, by VTI measures 2.98 cm. Pulmonic Valve: The pulmonic valve was normal in structure. Pulmonic valve regurgitation is not visualized. No evidence of pulmonic stenosis. Aorta: The aortic root is normal in size and structure. Venous: The inferior vena cava is normal in size with greater than 50% respiratory variability, suggesting right atrial pressure of 3 mmHg. IAS/Shunts: No atrial level shunt detected by color flow Doppler.  LEFT VENTRICLE PLAX 2D LVIDd:         3.30 cm   Diastology LVIDs:         2.20 cm   LV e' medial:    6.53 cm/s LV PW:         1.50 cm   LV E/e' medial:  12.2 LV IVS:        1.10 cm   LV e' lateral:   7.40 cm/s LVOT diam:     2.00 cm   LV E/e' lateral: 10.8 LV SV:         86 LV SV Index:   50 LVOT Area:     3.14 cm  RIGHT VENTRICLE RV Basal diam:  3.60 cm RV Mid diam:    3.30 cm RV S prime:     14.10 cm/s TAPSE (M-mode): 2.8 cm LEFT ATRIUM             Index        RIGHT ATRIUM           Index LA diam:        3.40 cm 1.97 cm/m   RA Area:     14.50 cm LA Vol (A2C):    47.3 ml 27.41 ml/m  RA Volume:   35.10 ml  20.34 ml/m LA Vol (A4C):   39.1 ml 22.66 ml/m LA Biplane Vol: 46.4 ml 26.89 ml/m  AORTIC VALVE AV Area (Vmax):    2.81 cm AV Area (Vmean):   2.72 cm AV Area (VTI):     2.98 cm AV Vmax:           131.00 cm/s AV Vmean:          97.300 cm/s AV VTI:            0.290 m AV Peak Grad:      6.9 mmHg AV Mean Grad:      4.0 mmHg LVOT Vmax:         117.00 cm/s LVOT Vmean:        84.200 cm/s LVOT VTI:          0.275 m LVOT/AV VTI ratio: 0.95 MITRAL VALVE               TRICUSPID VALVE MV Area (PHT): 2.06 cm    TR Peak grad:   34.8 mmHg MV Decel Time: 369 msec    TR Vmax:        295.00 cm/s MV E velocity: 79.70 cm/s MV A velocity: 90.10 cm/s  SHUNTS MV E/A ratio:  0.88        Systemic VTI:  0.28 m  Systemic Diam: 2.00 cm Isaias Cowman MD Electronically signed by Isaias Cowman MD Signature Date/Time: 04/18/2022/1:03:29 PM    Final    DG Chest Port 1 View  Result Date: 04/16/2022 CLINICAL DATA:  Pt to ED via EMS from [facility] for increased SOB, and HTN today, with bilat foot swelling x1 week. Pt hx dementia, COPD EXAM: PORTABLE CHEST - 1 VIEW COMPARISON:  03/08/2022 FINDINGS: Mild bibasilar interstitial opacities. No confluent airspace disease. Heart size and mediastinal contours are within normal limits. Aortic Atherosclerosis (ICD10-170.0). No effusion. Visualized bones unremarkable. IMPRESSION: Mild bibasilar interstitial opacities, new since previous, may represent subsegmental atelectasis versus early infiltrates or edema. Electronically Signed   By: Lucrezia Europe M.D.   On: 04/16/2022 13:20    Labs: Basic Metabolic Panel: Recent Labs  Lab 04/16/22 2033 04/17/22 0138 04/17/22 0529 04/17/22 1455 04/18/22 0230 04/19/22 0518 04/20/22 0823 04/20/22 1835 04/20/22 2156 04/21/22 0531 04/23/22 0530  NA 123*   < > 127*   < > 128* 129* 127* 128* 128* 129* 132*  K 4.2   < > 3.9   < > 3.9 3.5  --  4.0 4.0 4.0 3.8  CL 87*   <  > 91*   < > 89* 89*  --  87* 89* 90* 90*  CO2 28   < > 30   < > 32 36*  --  29 30 31 30   GLUCOSE 90   < > 92   < > 102* 107*  --  130* 128* 99 103*  BUN 15   < > 15   < > 21 25*  --  25* 26* 21 31*  CREATININE 0.77   < > 0.83   < > 0.90 0.99  --  1.15* 1.05* 0.86 1.21*  CALCIUM 8.6*   < > 8.6*   < > 8.6* 8.5*  --  9.0 8.7* 8.9 9.2  MG 1.6*  --  1.8  --  1.6* 2.5*  --   --   --   --   --   PHOS 3.7  --   --   --  4.0  --   --   --   --   --   --    < > = values in this interval not displayed.   CBC: Recent Labs  Lab 04/16/22 1139 04/17/22 0529 04/21/22 0531  WBC 4.2 4.9 4.6  HGB 11.1* 12.1 11.0*  HCT 33.2* 36.0 32.8*  MCV 90.7 89.8 90.1  PLT 242 258 230   Microbiology: Results for orders placed or performed during the hospital encounter of 04/16/22  Resp panel by RT-PCR (RSV, Flu A&B, Covid) Anterior Nasal Swab     Status: None   Collection Time: 04/16/22 11:46 AM   Specimen: Anterior Nasal Swab  Result Value Ref Range Status   SARS Coronavirus 2 by RT PCR NEGATIVE NEGATIVE Final    Comment: (NOTE) SARS-CoV-2 target nucleic acids are NOT DETECTED.  The SARS-CoV-2 RNA is generally detectable in upper respiratory specimens during the acute phase of infection. The lowest concentration of SARS-CoV-2 viral copies this assay can detect is 138 copies/mL. A negative result does not preclude SARS-Cov-2 infection and should not be used as the sole basis for treatment or other patient management decisions. A negative result may occur with  improper specimen collection/handling, submission of specimen other than nasopharyngeal swab, presence of viral mutation(s) within the areas targeted by this assay, and inadequate number of viral copies(<138 copies/mL). A  negative result must be combined with clinical observations, patient history, and epidemiological information. The expected result is Negative.  Fact Sheet for Patients:  BloggerCourse.com  Fact Sheet  for Healthcare Providers:  SeriousBroker.it  This test is no t yet approved or cleared by the [place] FDA and  has been authorized for detection and/or diagnosis of SARS-CoV-2 by FDA under an Emergency Use Authorization (EUA). This EUA will remain  in effect (meaning this test can be used) for the duration of the COVID-19 declaration under Section 564(b)(1) of the Act, 21 U.S.C.section 360bbb-3(b)(1), unless the authorization is terminated  or revoked sooner.       Influenza A by PCR NEGATIVE NEGATIVE Final   Influenza B by PCR NEGATIVE NEGATIVE Final    Comment: (NOTE) The Xpert Xpress SARS-CoV-2/FLU/RSV plus assay is intended as an aid in the diagnosis of influenza from Nasopharyngeal swab specimens and should not be used as a sole basis for treatment. Nasal washings and aspirates are unacceptable for Xpert Xpress SARS-CoV-2/FLU/RSV testing.  Fact Sheet for Patients: BloggerCourse.com  Fact Sheet for Healthcare Providers: SeriousBroker.it  This test is not yet approved or cleared by the [place] FDA and has been authorized for detection and/or diagnosis of SARS-CoV-2 by FDA under an Emergency Use Authorization (EUA). This EUA will remain in effect (meaning this test can be used) for the duration of the COVID-19 declaration under Section 564(b)(1) of the Act, 21 U.S.C. section 360bbb-3(b)(1), unless the authorization is terminated or revoked.     Resp Syncytial Virus by PCR NEGATIVE NEGATIVE Final    Comment: (NOTE) Fact Sheet for Patients: BloggerCourse.com  Fact Sheet for Healthcare Providers: SeriousBroker.it  This test is not yet approved or cleared by the [place] FDA and has been authorized for detection and/or diagnosis of SARS-CoV-2 by FDA under an Emergency Use Authorization (EUA). This EUA will remain in effect (meaning  this test can be used) for the duration of the COVID-19 declaration under Section 564(b)(1) of the Act, 21 U.S.C. section 360bbb-3(b)(1), unless the authorization is terminated or revoked.  Performed at [facility], [address] [place], [place] [address]    Time coordinating discharge: Over 30 minutes  Leeroy Bock, MD  [facility] 04/23/2022, 11:23 AM

## 2022-04-23 NOTE — TOC Transition Note (Signed)
Transition of Care [facility]) - CM/SW Discharge Note   Patient Details  Name: Samantha Carpenter MRN: [id number] Date of Birth: 01-28-1941  Transition of [facility]) CM/SW Contact:  Laurena Slimmer, RN Phone Number: 04/23/2022, 11:28 AM   Clinical Narrative:    Damaris Schooner with Rachel Bo at [facility]. Patient will discharge today to room #220. Nurse will call report to [phone].  Discharge summary and FL2 faxed to [phone]. Facility will transport patient.  Spoke with sister Baxter Hire, [phone].  TOC signing off.           Patient Goals and CMS Choice      Discharge Placement                         Discharge Plan and Services Additional resources added to the After Visit Summary for                                       Social Determinants of Health (SDOH) Interventions SDOH Screenings   Food Insecurity: No Food Insecurity (09/02/2018)  Transportation Needs: No Transportation Needs (02/15/2022)  Financial Resource Strain: Low Risk  (09/02/2018)  Physical Activity: Sufficiently Active (09/02/2018)  Social Connections: Unknown (09/02/2018)  Stress: No Stress Concern Present (09/02/2018)  Tobacco Use: Medium Risk (04/16/2022)     Readmission Risk Interventions    01/11/2022    2:05 PM 01/09/2022    2:23 PM  Readmission Risk Prevention Plan  Transportation Screening  Complete  HRI or Home Care Consult Complete   Palliative Care Screening  Not Applicable  Medication Review (RN Care Manager)  Complete

## 2022-04-28 ENCOUNTER — Other Ambulatory Visit: Payer: Self-pay

## 2022-04-28 ENCOUNTER — Emergency Department
Admission: EM | Admit: 2022-04-28 | Discharge: 2022-04-28 | Disposition: A | Discharge status: Skilled Nursing Facility | Payer: Medicare HMO | Attending: Emergency Medicine | Admitting: Emergency Medicine

## 2022-04-28 ENCOUNTER — Emergency Department: Payer: Medicare HMO

## 2022-04-28 DIAGNOSIS — N189 Chronic kidney disease, unspecified: Secondary | ICD-10-CM | POA: Diagnosis not present

## 2022-04-28 DIAGNOSIS — Z1152 Encounter for screening for COVID-19: Secondary | ICD-10-CM | POA: Insufficient documentation

## 2022-04-28 DIAGNOSIS — I129 Hypertensive chronic kidney disease with stage 1 through stage 4 chronic kidney disease, or unspecified chronic kidney disease: Secondary | ICD-10-CM | POA: Diagnosis not present

## 2022-04-28 DIAGNOSIS — J441 Chronic obstructive pulmonary disease with (acute) exacerbation: Secondary | ICD-10-CM | POA: Diagnosis not present

## 2022-04-28 DIAGNOSIS — R0602 Shortness of breath: Secondary | ICD-10-CM | POA: Diagnosis present

## 2022-04-28 DIAGNOSIS — F0390 Unspecified dementia without behavioral disturbance: Secondary | ICD-10-CM | POA: Diagnosis not present

## 2022-04-28 LAB — CBC WITH DIFFERENTIAL/PLATELET
Abs Immature Granulocytes: 0.03 10*3/uL (ref 0.00–0.07)
Basophils Absolute: 0 10*3/uL (ref 0.0–0.1)
Basophils Relative: 1 %
Eosinophils Absolute: 0.3 10*3/uL (ref 0.0–0.5)
Eosinophils Relative: 5 %
HCT: 31.9 % — ABNORMAL LOW (ref 36.0–46.0)
Hemoglobin: 10.1 g/dL — ABNORMAL LOW (ref 12.0–15.0)
Immature Granulocytes: 1 %
Lymphocytes Relative: 16 %
Lymphs Abs: 1 10*3/uL (ref 0.7–4.0)
MCH: 29.2 pg (ref 26.0–34.0)
MCHC: 31.7 g/dL (ref 30.0–36.0)
MCV: 92.2 fL (ref 80.0–100.0)
Monocytes Absolute: 0.6 10*3/uL (ref 0.1–1.0)
Monocytes Relative: 9 %
Neutro Abs: 4.5 10*3/uL (ref 1.7–7.7)
Neutrophils Relative %: 68 %
Platelets: 253 10*3/uL (ref 150–400)
RBC: 3.46 MIL/uL — ABNORMAL LOW (ref 3.87–5.11)
RDW: 13.6 % (ref 11.5–15.5)
WBC: 6.5 10*3/uL (ref 4.0–10.5)
nRBC: 0 % (ref 0.0–0.2)

## 2022-04-28 LAB — COMPREHENSIVE METABOLIC PANEL
ALT: 12 U/L (ref 0–44)
AST: 19 U/L (ref 15–41)
Albumin: 3.9 g/dL (ref 3.5–5.0)
Alkaline Phosphatase: 54 U/L (ref 38–126)
Anion gap: 9 (ref 5–15)
BUN: 25 mg/dL — ABNORMAL HIGH (ref 8–23)
CO2: 32 mmol/L (ref 22–32)
Calcium: 9.3 mg/dL (ref 8.9–10.3)
Chloride: 95 mmol/L — ABNORMAL LOW (ref 98–111)
Creatinine, Ser: 0.95 mg/dL (ref 0.44–1.00)
GFR, Estimated: 59 mL/min — ABNORMAL LOW (ref >60)
Glucose, Bld: 105 mg/dL — ABNORMAL HIGH (ref 70–99)
Potassium: 3.6 mmol/L (ref 3.5–5.1)
Sodium: 136 mmol/L (ref 135–145)
Total Bilirubin: 0.4 mg/dL (ref 0.3–1.2)
Total Protein: 6.7 g/dL (ref 6.5–8.1)

## 2022-04-28 LAB — TYPE AND SCREEN
ABO/RH(D): A POS
Antibody Screen: NEGATIVE

## 2022-04-28 LAB — RESP PANEL BY RT-PCR (RSV, FLU A&B, COVID)  RVPGX2
Influenza A by PCR: NEGATIVE
Influenza B by PCR: NEGATIVE
Resp Syncytial Virus by PCR: NEGATIVE
SARS Coronavirus 2 by RT PCR: NEGATIVE

## 2022-04-28 LAB — BRAIN NATRIURETIC PEPTIDE: B Natriuretic Peptide: 240.1 pg/mL — ABNORMAL HIGH (ref 0.0–100.0)

## 2022-04-28 MED ORDER — AMOXICILLIN-POT CLAVULANATE 875-125 MG PO TABS: 1.0000 | ORAL_TABLET | Freq: Two times a day (BID) | ORAL | 0 refills | Status: AC

## 2022-04-28 MED ORDER — AMOXICILLIN-POT CLAVULANATE 875-125 MG PO TABS
1.0000 | ORAL_TABLET | Freq: Once | ORAL | Status: AC
  Administered 2022-04-28: 1 via ORAL
  Filled 2022-04-28: qty 1

## 2022-04-28 MED ORDER — PREDNISONE 20 MG PO TABS
60.0000 mg | ORAL_TABLET | Freq: Once | ORAL | Status: AC
  Administered 2022-04-28: 60 mg via ORAL
  Filled 2022-04-28: qty 3

## 2022-04-28 MED ORDER — IPRATROPIUM-ALBUTEROL 0.5-2.5 (3) MG/3ML IN SOLN
3.0000 mL | Freq: Once | RESPIRATORY_TRACT | Status: AC
  Administered 2022-04-28: 3 mL via RESPIRATORY_TRACT
  Filled 2022-04-28: qty 3

## 2022-04-28 MED ORDER — PREDNISONE 50 MG PO TABS: ORAL_TABLET | ORAL | 0 refills | Status: DC

## 2022-04-28 NOTE — Discharge Instructions (Signed)
Your cough and shortness of breath is likely from a COPD exacerbation.  Please take the antibiotic twice a day for the next 5 days.  Please also take the prednisone once daily for the next 5 days.  Please continue to use your inhalers and nebulizer treatments in addition to taking your Lasix.

## 2022-04-28 NOTE — ED Triage Notes (Addendum)
Per ems, they were called out to the [facility] for weakness and dark brown emesis and increase mucous production, denies any actual nausea, d/c from hospital a week ago for CHF, hx of copd but staff states they have not been giving her the breathing treatments, no hx of thinners per ems  Given 1 breathing treatment of albuterol  Pt arrives on neb mask, audible expiratory wheezing, staff states at baseline with dementia, ambulatory for ems

## 2022-04-28 NOTE — ED Provider Notes (Signed)
[facility] Provider Note    Event Date/Time   First MD Initiated Contact with Patient 04/28/22 1142     (approximate)   History   Weakness and Shortness of Breath   HPI  Samantha Carpenter is a 82 y.o. female medical history hypertension, dementia, COPD, CKD who presents because of increased sputum production.  Per nursing who spoke with EMS they were called out because of weakness and dark brown emesis and increased mucus production.  EMS thinks she had vomiting what looked like coffee-ground but were not sure if it was coughing and patient has had increased cough production.  Facility told EMS that they did not give her her breathing treatment last night because of concern for flash pulmonary edema.  Patient tells me she has had increasing cough that looks green.  She thinks she may have vomited but is not sure denies any nausea denies belly pain denies shortness of breath fevers chills.  Does endorse lower extremity swelling.  No chest pain.   Patient was last admitted 5 days ago for acute on chronic diastolic heart failure.  EF was 60 to 65% with grade 1 diastolic dysfunction.  Patient was diuresed and weaned from 2 L to room air.  Past Medical History:  Diagnosis Date   Chronic kidney disease    COPD (chronic obstructive pulmonary disease) ([place])    Dementia ([place])    Depression    Hypertension    Liver disease     Patient Active Problem List   Diagnosis Date Noted   Acute hyponatremia 04/23/2022   Acute on chronic diastolic CHF (congestive heart failure) ([place]) 04/16/2022   Hyponatremia 04/16/2022   Hypothermia 04/16/2022   Hypomagnesemia 02/15/2022   RSV (respiratory syncytial virus pneumonia) 02/15/2022   COPD with acute exacerbation ([place]) 02/10/2022   Hypokalemia 02/10/2022   Falls frequently 02/10/2022   Hypoglycemia 01/11/2022   AKI (acute kidney injury) ([place]) 01/10/2022   Pressure injury of skin 01/09/2022   COPD exacerbation ([place]) 01/08/2022    Prediabetes 01/08/2022   Dementia with behavioral disturbance ([place]) 11/15/2021   Hypoxia 11/15/2021   COVID-19 virus infection 11/14/2021   Right rib fracture 11/09/2020   Fall at home, initial encounter 11/09/2020   COPD (chronic obstructive pulmonary disease) ([place]) 11/09/2020   Depression with anxiety 11/09/2020   Elevated troponin 11/09/2020   Acute respiratory failure with hypoxia ([place]) 11/09/2020   HTN (hypertension) 11/09/2020   Abrasions of multiple sites    Pure hypercholesterolemia 06/09/2018   Recurrent major depressive disorder, 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 --  Excl. in [place]? --     Most recent vital signs: Vitals:   04/28/22 1416 04/28/22 1430  BP:  (!) 152/92  Pulse:  69  Resp: 17 (!) 21  Temp:    SpO2:  99%     General: Awake, appears chronically ill CV:  Good peripheral perfusion.  Pitting edema 1+ bilaterally symmetric Resp:  Patient has audible wheezing wet cough but is able to speak in full sentences, diffuse expiratory and inspiratory wheezing with rhonchi throughout Abd:  No distention.  Abdomen is soft nontender Neuro:             Awake, Alert, Oriented x 3  Other:  Brown stool on rectal exam   ED Results / Procedures / Treatments  Labs (all labs ordered are listed, but only abnormal results are displayed) Labs Reviewed   COMPREHENSIVE METABOLIC PANEL - Abnormal; Notable for the following components:      Result Value   Chloride 95 (*)    Glucose, Bld 105 (*)    BUN 25 (*)    GFR, Estimated 59 (*)    All other components within normal limits  CBC WITH DIFFERENTIAL/PLATELET - Abnormal; Notable for the following components:   RBC 3.46 (*)    Hemoglobin 10.1 (*)    HCT 31.9 (*)    All other components within normal limits  BRAIN NATRIURETIC PEPTIDE - Abnormal; Notable for the following components:   B Natriuretic Peptide 240.1 (*)    All other components within normal limits  RESP PANEL BY RT-PCR (RSV, FLU A&B, COVID)  RVPGX2  TYPE AND SCREEN     EKG  EKG reviewed interpreted myself shows sinus rhythm normal axis normal intervals no acute ischemic changes   RADIOLOGY I reviewed and interpreted the chest x-ray which is negative for infiltrate or pulmonary edema   PROCEDURES:  Critical Care performed: No  .1-3 Lead EKG Interpretation  Performed by: Rada Hay, MD Authorized by: Rada Hay, MD     Interpretation: normal     ECG rate assessment: normal     Rhythm: sinus rhythm     Ectopy: none     Conduction: normal     The patient is on the cardiac monitor to evaluate for evidence of arrhythmia and/or significant heart rate changes.   MEDICATIONS ORDERED IN ED: Medications  predniSONE (DELTASONE) tablet 60 mg (60 mg Oral Given 04/28/22 1209)  ipratropium-albuterol (DUONEB) 0.5-2.5 (3) MG/3ML nebulizer solution 3 mL (3 mLs Nebulization Given 04/28/22 1209)  ipratropium-albuterol (DUONEB) 0.5-2.5 (3) MG/3ML nebulizer solution 3 mL (3 mLs Nebulization Given 04/28/22 1209)  amoxicillin-clavulanate (AUGMENTIN) 875-125 MG per tablet 1 tablet (1 tablet Oral Given 04/28/22 1437)     IMPRESSION / MDM / ASSESSMENT AND PLAN / ED COURSE  I reviewed the triage vital signs and the nursing notes.                              Patient's presentation is most consistent with acute  presentation with potential threat to life or bodily function.  Differential diagnosis includes, but is not limited to, COPD exacerbation, pneumonia, bronchitis, pulmonary edema, upper GI bleed  Patient is a 82 year old female with close CHF diastolic and COPD who presents because of both coughing and emesis.  Somewhat unclear about the exact nature of patient's symptoms apparently she had cough that was productive of brown sputum but then there was also some report of may be  coffee-ground emesis.  Patient does not recall any coffee-ground emesis says that her vomit and cough looked green.  She denies dyspnea currently.  Facility says she is at her baseline in terms of mental status.  On exam she does look short of breath with audible wheezing and wheezing on lung exam.  She does have trace pitting edema.  Abdomen is benign and on rectal exam she has brown stool.   Patient's chest x-ray does not have any lobar infiltrate.  Hemoglobin is 10, was 1110 days ago.  No leukocytosis.  COVID and flu negative.  BNP mildly elevated at 240.  CMP overall reassuring.  Stable renal function.  Given patient's wheezing, I do think this is likely COPD.  There is no clear pulmonary edema on chest x-ray.  Will give DuoNebs and steroids and reassess.  Currently the facility had not given nebs today or yesterday because of concern for precipitating flash pulmonary edema.  Patient feeling improved after nebs and steroids.  She is saturating well on room air.  Wheezing has improved says she feels back to baseline.  I have low suspicion for GI bleed at this time given brown stool on rectal exam suspect there may have been some posttussive emesis and sputum may have been dark.  Given likely COPD exacerbation with increased feeding production we will give antibiotics with Augmentin.  Prescribed 5-day course of prednisone.  FINAL CLINICAL IMPRESSION(S) / ED DIAGNOSES   Final diagnoses:  COPD exacerbation ([place])     Rx / DC  Orders   ED Discharge Orders          Ordered    amoxicillin-clavulanate (AUGMENTIN) 875-125 MG tablet  2 times daily        04/28/22 1422    predniSONE (DELTASONE) 50 MG tablet        04/28/22 1422             Note:  This document was prepared using Dragon voice recognition software and may include unintentional dictation errors.   Rada Hay, MD 04/28/22 [phone]

## 2022-04-28 NOTE — ED Notes (Signed)
Report to courtney at the [facility] at [facility]- she stated we would need to coordinate transportation as they do not have it available on the weekends

## 2022-04-28 NOTE — ED Notes (Signed)
Updated sister, Romie Minus, about what is going on

## 2022-06-29 ENCOUNTER — Emergency Department
Admission: EM | Admit: 2022-06-29 | Discharge: 2022-06-30 | Disposition: A | Discharge status: Skilled Nursing Facility | Payer: Medicare HMO | Attending: Emergency Medicine | Admitting: Emergency Medicine

## 2022-06-29 ENCOUNTER — Emergency Department: Payer: Medicare HMO

## 2022-06-29 ENCOUNTER — Other Ambulatory Visit: Payer: Self-pay

## 2022-06-29 ENCOUNTER — Encounter: Payer: Self-pay | Admitting: Emergency Medicine

## 2022-06-29 DIAGNOSIS — J449 Chronic obstructive pulmonary disease, unspecified: Secondary | ICD-10-CM | POA: Diagnosis not present

## 2022-06-29 DIAGNOSIS — I129 Hypertensive chronic kidney disease with stage 1 through stage 4 chronic kidney disease, or unspecified chronic kidney disease: Secondary | ICD-10-CM | POA: Diagnosis not present

## 2022-06-29 DIAGNOSIS — S0990XA Unspecified injury of head, initial encounter: Secondary | ICD-10-CM | POA: Insufficient documentation

## 2022-06-29 DIAGNOSIS — R4781 Slurred speech: Secondary | ICD-10-CM | POA: Diagnosis not present

## 2022-06-29 DIAGNOSIS — Z79899 Other long term (current) drug therapy: Secondary | ICD-10-CM | POA: Diagnosis not present

## 2022-06-29 DIAGNOSIS — F0390 Unspecified dementia without behavioral disturbance: Secondary | ICD-10-CM | POA: Insufficient documentation

## 2022-06-29 DIAGNOSIS — W19XXXA Unspecified fall, initial encounter: Secondary | ICD-10-CM | POA: Diagnosis not present

## 2022-06-29 DIAGNOSIS — N189 Chronic kidney disease, unspecified: Secondary | ICD-10-CM | POA: Insufficient documentation

## 2022-06-29 DIAGNOSIS — Y92129 Unspecified place in nursing home as the place of occurrence of the external cause: Secondary | ICD-10-CM | POA: Diagnosis not present

## 2022-06-29 DIAGNOSIS — Z7951 Long term (current) use of inhaled steroids: Secondary | ICD-10-CM | POA: Diagnosis not present

## 2022-06-29 DIAGNOSIS — Z7982 Long term (current) use of aspirin: Secondary | ICD-10-CM | POA: Insufficient documentation

## 2022-06-29 DIAGNOSIS — R4182 Altered mental status, unspecified: Secondary | ICD-10-CM | POA: Insufficient documentation

## 2022-06-29 LAB — BASIC METABOLIC PANEL
Anion gap: 14 (ref 5–15)
BUN: 30 mg/dL — ABNORMAL HIGH (ref 8–23)
CO2: 28 mmol/L (ref 22–32)
Calcium: 9.1 mg/dL (ref 8.9–10.3)
Chloride: 93 mmol/L — ABNORMAL LOW (ref 98–111)
Creatinine, Ser: 1.44 mg/dL — ABNORMAL HIGH (ref 0.44–1.00)
GFR, Estimated: 36 mL/min — ABNORMAL LOW (ref >60)
Glucose, Bld: 118 mg/dL — ABNORMAL HIGH (ref 70–99)
Potassium: 3.8 mmol/L (ref 3.5–5.1)
Sodium: 135 mmol/L (ref 135–145)

## 2022-06-29 LAB — CBC
HCT: 36.7 % (ref 36.0–46.0)
Hemoglobin: 11.9 g/dL — ABNORMAL LOW (ref 12.0–15.0)
MCH: 30.3 pg (ref 26.0–34.0)
MCHC: 32.4 g/dL (ref 30.0–36.0)
MCV: 93.4 fL (ref 80.0–100.0)
Platelets: 253 10*3/uL (ref 150–400)
RBC: 3.93 MIL/uL (ref 3.87–5.11)
RDW: 13.8 % (ref 11.5–15.5)
WBC: 6.2 10*3/uL (ref 4.0–10.5)
nRBC: 0 % (ref 0.0–0.2)

## 2022-06-29 LAB — TROPONIN I (HIGH SENSITIVITY): Troponin I (High Sensitivity): 11 ng/L (ref <18)

## 2022-06-29 MED ORDER — SODIUM CHLORIDE 0.9 % IV BOLUS (SEPSIS)
500.0000 mL | Freq: Once | INTRAVENOUS | Status: AC
  Administered 2022-06-30: 500 mL via INTRAVENOUS
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
## 2022-06-29 NOTE — ED Triage Notes (Signed)
Pt presents via [facility] from the [facility] for a fall and AMS - multiple falls today. Hx of dementia and staff at SNF states that she falls frequently. Per EMS, pt is slower to respond when promoted. Pt denies complaints at this time. Denies CP or SOB.   VSS with EMS [id number]

## 2022-06-30 ENCOUNTER — Emergency Department: Payer: Medicare HMO

## 2022-06-30 ENCOUNTER — Encounter: Payer: Self-pay | Admitting: Radiology

## 2022-06-30 LAB — URINE DRUG SCREEN, QUALITATIVE (ARMC ONLY)
Amphetamines, Ur Screen: NOT DETECTED
Barbiturates, Ur Screen: NOT DETECTED
Benzodiazepine, Ur Scrn: POSITIVE — AB
Cannabinoid 50 Ng, Ur ~~LOC~~: NOT DETECTED
Cocaine Metabolite,Ur ~~LOC~~: NOT DETECTED
MDMA (Ecstasy)Ur Screen: NOT DETECTED
Methadone Scn, Ur: NOT DETECTED
Opiate, Ur Screen: NOT DETECTED
Phencyclidine (PCP) Ur S: NOT DETECTED
Tricyclic, Ur Screen: POSITIVE — AB

## 2022-06-30 LAB — BLOOD GAS, VENOUS
Acid-Base Excess: 9 mmol/L — ABNORMAL HIGH (ref 0.0–2.0)
Bicarbonate: 35.9 mmol/L — ABNORMAL HIGH (ref 20.0–28.0)
O2 Saturation: 80.1 %
Patient temperature: 37
pCO2, Ven: 58 mmHg (ref 44–60)
pH, Ven: 7.4 (ref 7.25–7.43)
pO2, Ven: 55 mmHg — ABNORMAL HIGH (ref 32–45)

## 2022-06-30 LAB — URINALYSIS, ROUTINE W REFLEX MICROSCOPIC
Bilirubin Urine: NEGATIVE
Glucose, UA: NEGATIVE mg/dL
Hgb urine dipstick: NEGATIVE
Ketones, ur: NEGATIVE mg/dL
Nitrite: NEGATIVE
Protein, ur: NEGATIVE mg/dL
Specific Gravity, Urine: 1.017 (ref 1.005–1.030)
pH: 5 (ref 5.0–8.0)

## 2022-06-30 LAB — HEPATIC FUNCTION PANEL
ALT: 12 U/L (ref 0–44)
AST: 21 U/L (ref 15–41)
Albumin: 4 g/dL (ref 3.5–5.0)
Alkaline Phosphatase: 72 U/L (ref 38–126)
Bilirubin, Direct: <0.1 mg/dL (ref 0.0–0.2)
Total Bilirubin: 0.3 mg/dL (ref 0.3–1.2)
Total Protein: 6.8 g/dL (ref 6.5–8.1)

## 2022-06-30 LAB — ETHANOL: Alcohol, Ethyl (B): <10 mg/dL (ref <10)

## 2022-06-30 LAB — TROPONIN I (HIGH SENSITIVITY): Troponin I (High Sensitivity): 8 ng/L (ref <18)

## 2022-06-30 LAB — AMMONIA: Ammonia: 23 umol/L (ref 9–35)

## 2022-06-30 LAB — TSH: TSH: 3.131 u[IU]/mL (ref 0.350–4.500)

## 2022-06-30 MED ORDER — ACETAMINOPHEN 500 MG PO TABS
1000.0000 mg | ORAL_TABLET | Freq: Once | ORAL | Status: AC
  Administered 2022-06-30: 1000 mg via ORAL
  Filled 2022-06-30: qty 2
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
## 2022-06-30 NOTE — ED Notes (Signed)
Patient ambulated with assistance to the bathroom.  

## 2022-06-30 NOTE — ED Notes (Signed)
[facility] called for transport to the [facility] of [address]

## 2022-06-30 NOTE — ED Notes (Signed)
Called X3 to [facility] to give report to nurse. No answer.

## 2022-07-01 LAB — URINE CULTURE: Culture: NO GROWTH

## 2022-08-03 ENCOUNTER — Encounter: Payer: Self-pay | Admitting: Emergency Medicine

## 2022-08-03 ENCOUNTER — Emergency Department: Payer: Medicare HMO

## 2022-08-03 ENCOUNTER — Emergency Department
Admission: EM | Admit: 2022-08-03 | Discharge: 2022-08-03 | Disposition: A | Discharge status: Home / Self Care | Payer: Medicare HMO | Attending: Emergency Medicine | Admitting: Emergency Medicine

## 2022-08-03 ENCOUNTER — Other Ambulatory Visit: Payer: Self-pay

## 2022-08-03 DIAGNOSIS — S51812A Laceration without foreign body of left forearm, initial encounter: Secondary | ICD-10-CM | POA: Diagnosis present

## 2022-08-03 DIAGNOSIS — R410 Disorientation, unspecified: Secondary | ICD-10-CM

## 2022-08-03 DIAGNOSIS — F0390 Unspecified dementia without behavioral disturbance: Secondary | ICD-10-CM | POA: Insufficient documentation

## 2022-08-03 DIAGNOSIS — X58XXXA Exposure to other specified factors, initial encounter: Secondary | ICD-10-CM | POA: Insufficient documentation

## 2022-08-03 LAB — CBC WITH DIFFERENTIAL/PLATELET
Abs Immature Granulocytes: 0.03 10*3/uL (ref 0.00–0.07)
Basophils Absolute: 0 10*3/uL (ref 0.0–0.1)
Basophils Relative: 1 %
Eosinophils Absolute: 0.6 10*3/uL — ABNORMAL HIGH (ref 0.0–0.5)
Eosinophils Relative: 7 %
HCT: 39.9 % (ref 36.0–46.0)
Hemoglobin: 12.5 g/dL (ref 12.0–15.0)
Immature Granulocytes: 0 %
Lymphocytes Relative: 18 %
Lymphs Abs: 1.5 10*3/uL (ref 0.7–4.0)
MCH: 29.8 pg (ref 26.0–34.0)
MCHC: 31.3 g/dL (ref 30.0–36.0)
MCV: 95.2 fL (ref 80.0–100.0)
Monocytes Absolute: 0.5 10*3/uL (ref 0.1–1.0)
Monocytes Relative: 6 %
Neutro Abs: 5.8 10*3/uL (ref 1.7–7.7)
Neutrophils Relative %: 68 %
Platelets: 293 10*3/uL (ref 150–400)
RBC: 4.19 MIL/uL (ref 3.87–5.11)
RDW: 13.7 % (ref 11.5–15.5)
WBC: 8.5 10*3/uL (ref 4.0–10.5)
nRBC: 0 % (ref 0.0–0.2)

## 2022-08-03 LAB — COMPREHENSIVE METABOLIC PANEL
ALT: 13 U/L (ref 0–44)
AST: 21 U/L (ref 15–41)
Albumin: 4.5 g/dL (ref 3.5–5.0)
Alkaline Phosphatase: 101 U/L (ref 38–126)
Anion gap: 10 (ref 5–15)
BUN: 24 mg/dL — ABNORMAL HIGH (ref 8–23)
CO2: 29 mmol/L (ref 22–32)
Calcium: 9.4 mg/dL (ref 8.9–10.3)
Chloride: 98 mmol/L (ref 98–111)
Creatinine, Ser: 1.04 mg/dL — ABNORMAL HIGH (ref 0.44–1.00)
GFR, Estimated: 53 mL/min — ABNORMAL LOW (ref >60)
Glucose, Bld: 144 mg/dL — ABNORMAL HIGH (ref 70–99)
Potassium: 4.1 mmol/L (ref 3.5–5.1)
Sodium: 137 mmol/L (ref 135–145)
Total Bilirubin: 0.4 mg/dL (ref 0.3–1.2)
Total Protein: 7.8 g/dL (ref 6.5–8.1)

## 2022-08-03 LAB — URINALYSIS, ROUTINE W REFLEX MICROSCOPIC
Bilirubin Urine: NEGATIVE
Glucose, UA: NEGATIVE mg/dL
Hgb urine dipstick: NEGATIVE
Ketones, ur: NEGATIVE mg/dL
Leukocytes,Ua: NEGATIVE
Nitrite: NEGATIVE
Protein, ur: NEGATIVE mg/dL
Specific Gravity, Urine: 1.014 (ref 1.005–1.030)
pH: 7 (ref 5.0–8.0)
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
## 2022-08-03 NOTE — ED Provider Notes (Signed)
[facility] Provider Note    Event Date/Time   First MD Initiated Contact with Patient 08/03/22 [phone]     (approximate)   History   Altered Mental Status   HPI  Samantha Carpenter is a 82 y.o. female with history of dementia here with altered mental status.  The patient reportedly became agitated and combative at her facility today.  She has history of similar presentations in the past.  She reportedly barricaded herself in her room.  She sustained a small skin tear while doing so.  She was sent here for further evaluation.  She is now back to her baseline and calm.  No other recent major medical changes.  No known recent medication changes.  She is unable to provide history my assessment.     Physical Exam   Triage Vital Signs: ED Triage Vitals  Enc Vitals Group     BP 08/03/22 0028 (!) 162/59     Pulse Rate 08/03/22 0028 (!) 52     Resp 08/03/22 0028 16     Temp 08/03/22 0028 97.8 F (36.6 C)     Temp Source 08/03/22 0028 Oral     SpO2 08/03/22 0021 98 %     Weight 08/03/22 0033 150 lb (68 kg)     Height 08/03/22 0033 5\' 4"  (1.626 m)     Head Circumference --      Peak Flow --      Pain Score 08/03/22 0030 0     Pain Loc --      Pain Edu? --      Excl. in GC? --     Most recent vital signs: Vitals:   08/03/22 0028 08/03/22 0345  BP: (!) 162/59 (!) 160/64  Pulse: (!) 52 (!) 56  Resp: 16 (!) 23  Temp: 97.8 F (36.6 C)   SpO2:  97%     General: Awake, no distress.  CV:  Good peripheral perfusion.  Resp:  Normal work of breathing.  Abd:  No distention.  Other:  Superficial skin tear to the left arm.  No deep lacerations.  Patient is calm and cooperative.   ED Results / Procedures / Treatments   Labs (all labs ordered are listed, but only abnormal results are displayed) Labs Reviewed  CBC WITH DIFFERENTIAL/PLATELET - Abnormal; Notable for the following components:      Result Value   Eosinophils Absolute 0.6 (*)    All other  components within normal limits  COMPREHENSIVE METABOLIC PANEL - Abnormal; Notable for the following components:   Glucose, Bld 144 (*)    BUN 24 (*)    Creatinine, Ser 1.04 (*)    GFR, Estimated 53 (*)    All other components within normal limits  URINALYSIS, ROUTINE W REFLEX MICROSCOPIC - Abnormal; Notable for the following components:   Color, Urine YELLOW (*)    APPearance CLEAR (*)    All other components within normal limits     EKG Normal sinus rhythm, ventricular rate 59.  QRS 101, QTc 451.  No acute ST elevations or depressions no acute evidence of acute ischemia or infarct.   RADIOLOGY Chest x-ray: No acute abnormality CT head: No acute abnormality   I also independently reviewed and agree with radiologist interpretations.   PROCEDURES:  Critical Care performed: No   MEDICATIONS ORDERED IN ED: Medications - No data to display   IMPRESSION / MDM / ASSESSMENT AND PLAN / ED COURSE  I reviewed the triage vital  signs and the nursing notes.                              Differential diagnosis includes, but is not limited to, behavioral issue, acute encephalopathy in the setting of UTI or pneumonia, dehydration, polypharmacy  Patient's presentation is most consistent with acute presentation with potential threat to life or bodily function.  The patient is on the cardiac monitor to evaluate for evidence of arrhythmia and/or significant heart rate changes    82 yo F with PMHx dementia here with reported agitation/confusion. Pt calm and at baseline per report here. Afebrile, well appearing. No signs of trauma. CT head: NAICA. CXR Clear. CBC, lytes wnl. Skin tear dressed. No apparent emergent pathology. Will d/c with outpt follow-up.  FINAL CLINICAL IMPRESSION(S) / ED DIAGNOSES   Final diagnoses:  Confusion  Skin tear of left forearm without complication, initial encounter     Rx / DC Orders   ED Discharge Orders     None        Note:  This document  was prepared using Dragon voice recognition software and may include unintentional dictation errors.   Shaune Pollack, MD 08/03/22 [phone]

## 2022-08-07 ENCOUNTER — Other Ambulatory Visit: Payer: Self-pay

## 2022-08-07 ENCOUNTER — Emergency Department: Payer: Medicare HMO

## 2022-08-07 ENCOUNTER — Emergency Department
Admission: EM | Admit: 2022-08-07 | Discharge: 2022-08-07 | Disposition: A | Discharge status: Home / Self Care | Payer: Medicare HMO | Attending: Emergency Medicine | Admitting: Emergency Medicine

## 2022-08-07 DIAGNOSIS — R079 Chest pain, unspecified: Secondary | ICD-10-CM | POA: Diagnosis not present

## 2022-08-07 DIAGNOSIS — R001 Bradycardia, unspecified: Secondary | ICD-10-CM | POA: Insufficient documentation

## 2022-08-07 LAB — CBC
HCT: 36.8 % (ref 36.0–46.0)
Hemoglobin: 11.5 g/dL — ABNORMAL LOW (ref 12.0–15.0)
MCH: 29.7 pg (ref 26.0–34.0)
MCHC: 31.3 g/dL (ref 30.0–36.0)
MCV: 95.1 fL (ref 80.0–100.0)
Platelets: 238 10*3/uL (ref 150–400)
RBC: 3.87 MIL/uL (ref 3.87–5.11)
RDW: 14.1 % (ref 11.5–15.5)
WBC: 6.8 10*3/uL (ref 4.0–10.5)
nRBC: 0 % (ref 0.0–0.2)

## 2022-08-07 LAB — HEPATIC FUNCTION PANEL
ALT: 12 U/L (ref 0–44)
AST: 18 U/L (ref 15–41)
Albumin: 4.1 g/dL (ref 3.5–5.0)
Alkaline Phosphatase: 85 U/L (ref 38–126)
Bilirubin, Direct: <0.1 mg/dL (ref 0.0–0.2)
Total Bilirubin: 0.3 mg/dL (ref 0.3–1.2)
Total Protein: 6.7 g/dL (ref 6.5–8.1)

## 2022-08-07 LAB — BASIC METABOLIC PANEL WITH GFR
Anion gap: 7 (ref 5–15)
BUN: 24 mg/dL — ABNORMAL HIGH (ref 8–23)
CO2: 32 mmol/L (ref 22–32)
Calcium: 8.8 mg/dL — ABNORMAL LOW (ref 8.9–10.3)
Chloride: 100 mmol/L (ref 98–111)
Creatinine, Ser: 1.03 mg/dL — ABNORMAL HIGH (ref 0.44–1.00)
GFR, Estimated: 54 mL/min — ABNORMAL LOW (ref >60)
Glucose, Bld: 135 mg/dL — ABNORMAL HIGH (ref 70–99)
Potassium: 3.6 mmol/L (ref 3.5–5.1)
Sodium: 139 mmol/L (ref 135–145)

## 2022-08-07 LAB — TROPONIN I (HIGH SENSITIVITY)
Troponin I (High Sensitivity): 9 ng/L (ref <18)
Troponin I (High Sensitivity): 8 ng/L (ref <18)

## 2022-08-07 LAB — LIPASE, BLOOD: Lipase: 33 U/L (ref 11–51)

## 2022-08-07 MED ORDER — ALUM & MAG HYDROXIDE-SIMETH 200-200-20 MG/5ML PO SUSP
30.0000 mL | Freq: Once | ORAL | Status: AC
  Administered 2022-08-07: 30 mL via ORAL
  Filled 2022-08-07: qty 30

## 2022-08-07 MED ORDER — LIDOCAINE VISCOUS HCL 2 % MT SOLN
15.0000 mL | Freq: Once | OROMUCOSAL | Status: AC
  Administered 2022-08-07: 15 mL via ORAL
  Filled 2022-08-07: qty 15

## 2022-08-07 MED ORDER — SUCRALFATE 1 G PO TABS: 1.0000 g | ORAL_TABLET | Freq: Four times a day (QID) | ORAL | 0 refills | Status: DC

## 2022-08-07 MED ORDER — FAMOTIDINE 20 MG PO TABS: 20.0000 mg | ORAL_TABLET | Freq: Every day | ORAL | 1 refills | Status: DC

## 2022-08-07 NOTE — ED Notes (Signed)
[facility] contacted if they can pick up pt. They said their transportation staff was off today.

## 2022-08-07 NOTE — ED Notes (Signed)
Pt ambulated to bathroom with this RN. Tolerated well. Had BM and urinated.

## 2022-08-07 NOTE — ED Notes (Signed)
Pt refused for this RN to attempt contacting pt's sister or niece who are listed contacts in her chart.

## 2022-08-07 NOTE — ED Provider Notes (Signed)
[facility] Provider Note    Event Date/Time   First MD Initiated Contact with Patient 08/07/22 0901     (approximate)   History   Chest Pain   HPI  Samantha Carpenter is a 82 y.o. female who presents to the emergency department today because of concerns for chest pain.  She tells me that it is been going on for 2 weeks.  It does slightly wax and wane.  Is located in her lower central chest.  She denies anything that makes it better or worse.  The patient denies any associated shortness of breath.  Denies similar pain in the past.  Denies any fevers.      Physical Exam   Triage Vital Signs: ED Triage Vitals  Enc Vitals Group     BP 08/07/22 0904 (!) 151/64     Pulse Rate 08/07/22 0904 70     Resp 08/07/22 0904 (!) 26     Temp 08/07/22 0908 98 F (36.7 C)     Temp Source 08/07/22 0908 Oral     SpO2 08/07/22 0912 97 %     Weight 08/07/22 0908 154 lb 5.2 oz (70 kg)     Height 08/07/22 0908 5\' 4"  (1.626 m)     Head Circumference --      Peak Flow --      Pain Score 08/07/22 0908 7     Pain Loc --      Pain Edu? --      Excl. in GC? --     Most recent vital signs: Vitals:   08/07/22 0908 08/07/22 0912  BP:    Pulse:    Resp:    Temp: 98 F (36.7 C)   SpO2:  97%   General: Awake, alert, not completely oriented. CV:  Good peripheral perfusion. Bradycardia. Resp:  Normal effort. Lungs clear. Abd:  No distention.     ED Results / Procedures / Treatments   Labs (all labs ordered are listed, but only abnormal results are displayed) Labs Reviewed  BASIC METABOLIC PANEL - Abnormal; Notable for the following components:      Result Value   Glucose, Bld 135 (*)    BUN 24 (*)    Creatinine, Ser 1.03 (*)    Calcium 8.8 (*)    GFR, Estimated 54 (*)    All other components within normal limits  CBC - Abnormal; Notable for the following components:   Hemoglobin 11.5 (*)    All other components within normal limits  LIPASE, BLOOD  HEPATIC  FUNCTION PANEL  TROPONIN I (HIGH SENSITIVITY)  TROPONIN I (HIGH SENSITIVITY)     EKG   I, Phineas Semen, attending physician, personally viewed and interpreted this EKG  EKG Time: 0905 Rate: 50 Rhythm: sinus bradycardia Axis: normal Intervals: qtc 428 QRS: narrow ST changes: no st elevation Impression: abnormal ekg   RADIOLOGY I independently interpreted and visualized the CXR. My interpretation: No pneumonia Radiology interpretation:  IMPRESSION:  1. No acute cardiopulmonary process.  2. Mild chronic bilateral interstitial thickening is unchanged from  multiple prior radiographs.      PROCEDURES:  Critical Care performed: No   MEDICATIONS ORDERED IN ED: Medications - No data to display   IMPRESSION / MDM / ASSESSMENT AND PLAN / ED COURSE  I reviewed the triage vital signs and the nursing notes.  Differential diagnosis includes, but is not limited to, ACS, pneumonia, esophagitis, PE, dissection  Patient's presentation is most consistent with acute presentation with potential threat to life or bodily function.   The patient is on the cardiac monitor to evaluate for evidence of arrhythmia and/or significant heart rate changes.  Patient presented to the emergency department today with 2-week history of chest pain.  It does wax and wane.  On exam lungs without concerning findings.  Heart rate slightly bradycardic although no concerning arrhythmias.  EKG without ST elevation.  Chest x-ray without pneumonia or pneumothorax.  Blood work with troponin negative x 2.  At this time somewhat unclear etiology however I do wonder if esophagitis likely.  Discussed this with the patient.  Given reassuring workup and low clinical suspicion for dissection or PE I think is reasonable for patient to be discharged back to living facility. Encouraged patient to follow up with PCPs.      FINAL CLINICAL IMPRESSION(S) / ED DIAGNOSES   Final diagnoses:   Nonspecific chest pain    Note:  This document was prepared using Dragon voice recognition software and may include unintentional dictation errors.    Phineas Semen, MD 08/07/22 [phone]

## 2022-08-07 NOTE — ED Triage Notes (Signed)
Pt had chest pain below the sternum at 7a this morning. From [place] at [facility]. Dementia hx, pt A&Ox4. Pt on 81 mg of aspirin daily, EMS gave her more to meet protocol for chest pain. EMS vitals BP 166/66 HR 52  EKG SB with 1st degree HB Temp 98.8  CBG 201

## 2022-10-13 ENCOUNTER — Emergency Department: Payer: Medicare HMO

## 2022-10-13 ENCOUNTER — Other Ambulatory Visit: Payer: Self-pay

## 2022-10-13 ENCOUNTER — Emergency Department
Admission: EM | Admit: 2022-10-13 | Discharge: 2022-10-14 | Disposition: A | Discharge status: Skilled Nursing Facility | Payer: Medicare HMO | Attending: Emergency Medicine | Admitting: Emergency Medicine

## 2022-10-13 DIAGNOSIS — F0390 Unspecified dementia without behavioral disturbance: Secondary | ICD-10-CM | POA: Insufficient documentation

## 2022-10-13 DIAGNOSIS — W1839XA Other fall on same level, initial encounter: Secondary | ICD-10-CM | POA: Insufficient documentation

## 2022-10-13 DIAGNOSIS — I10 Essential (primary) hypertension: Secondary | ICD-10-CM | POA: Insufficient documentation

## 2022-10-13 DIAGNOSIS — S0990XA Unspecified injury of head, initial encounter: Secondary | ICD-10-CM | POA: Insufficient documentation

## 2022-10-13 DIAGNOSIS — S62101A Fracture of unspecified carpal bone, right wrist, initial encounter for closed fracture: Secondary | ICD-10-CM | POA: Insufficient documentation

## 2022-10-13 DIAGNOSIS — J449 Chronic obstructive pulmonary disease, unspecified: Secondary | ICD-10-CM | POA: Diagnosis not present

## 2022-10-13 DIAGNOSIS — M79641 Pain in right hand: Secondary | ICD-10-CM | POA: Diagnosis present

## 2022-10-13 NOTE — ED Triage Notes (Addendum)
Pt to ed from [facility] for a fall. Pt was walking to get icecream and she fell and struck her forehead on the door frame. Pt is alert but pt has HX of dementia. Pt has several fingers that show evidence of new bruising and swelling. Pt also has some expiratory wheezing and complaining of Rib pain.

## 2022-10-14 NOTE — ED Notes (Signed)
Patient removed splint. " I can't use my arm with this gadget on it." Provider made aware of situation.

## 2022-10-14 NOTE — ED Notes (Addendum)
Pt eating ice cream  °
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
## 2022-10-14 NOTE — ED Notes (Signed)
Pt walked out of room. NT asked pt what pt needs were. Pt sts she will leave and walk out and that she is hungry. Nt redirected pt back to room.

## 2022-10-14 NOTE — ED Provider Notes (Signed)
[facility] Provider Note    Event Date/Time   First MD Initiated Contact with Patient 10/14/22 [phone]     (approximate)   History   Fall   HPI  Samantha Carpenter is a 82 y.o. female with history of dementia, COPD, hypertension presenting to the emergency department for evaluation after a fall.  Patient was walking to get ice cream when she fell and hit her head on the door frame.  Also complaining of rib pain and noted to have some bruising of her fingers in triage.  Patient denies preceding chest pain, shortness of breath, lightheadedness.  Complains of pain in her right hand on my evaluation.      Physical Exam   Triage Vital Signs: ED Triage Vitals  Encounter Vitals Group     BP 10/13/22 2256 (!) 165/67     Systolic BP Percentile --      Diastolic BP Percentile --      Pulse Rate 10/13/22 2252 67     Resp 10/13/22 2252 18     Temp 10/13/22 2252 98 F (36.7 C)     Temp Source 10/13/22 2252 Oral     SpO2 10/13/22 2252 100 %     Weight --      Height 10/13/22 2252 5\' 4"  (1.626 m)     Head Circumference --      Peak Flow --      Pain Score --      Pain Loc --      Pain Education --      Exclude from Growth Chart --     Most recent vital signs: Vitals:   10/14/22 0300 10/14/22 0330  BP: (!) 162/59 (!) 185/70  Pulse: (!) 55 (!) 58  Resp:  16  Temp:    SpO2: 94% 90%   Nursing notes and vital signs reviewed.  General: Adult female, laying in bed, awake interactive Head: Atraumatic Chest: Symmetric chest rise, no significant tenderness to palpation at the time my evaluation Cardiac: Regular rhythm and rate.  Respiratory: Lungs clear to auscultation Abdomen: Soft, nondistended. No tenderness to palpation.  Pelvis: Stable in AP and lateral compression. No tenderness to palpation. MSK: No deformity to bilateral upper and lower extremity.  Freely range bilateral upper and lower extremities.  There is some tenderness to palpation along the right  wrist that patient is able to partly range the wrist.   Neuro: Alert, oriented to situation. Normal sensation to light touch in bilateral upper and lower extremity. Skin: No evidence of burns or lacerations.  ED Results / Procedures / Treatments   Labs (all labs ordered are listed, but only abnormal results are displayed) Labs Reviewed - No data to display   EKG EKG independently reviewed interpreted by myself (ER attending) demonstrates:    RADIOLOGY Imaging independently reviewed and interpreted by myself demonstrates:  CT head without acute bleed CT C-spine without acute fracture Rib x-Samantha Carpenter without acute fracture Hand x-Samantha Carpenter with suspected fracture of triquetrum and radial styloid  PROCEDURES:  Critical Care performed: No  Procedures   MEDICATIONS ORDERED IN ED: Medications - No data to display   IMPRESSION / MDM / ASSESSMENT AND PLAN / ED COURSE  I reviewed the triage vital signs and the nursing notes.  Differential diagnosis includes, but is not limited to, internal bleed, skull fracture, spine fracture, pneumothorax, rib fracture, hand or wrist fracture  Patient's presentation is most consistent with acute presentation with potential threat to life or  bodily function.  82 year old female presenting to the emergency department for evaluation after a ground-level fall.  Imaging notable for radial styloid chip fracture and possible acute chip fracture of the dorsal triquetrium.  Given only a chip fracture of the radial styloid, I did initially have a volar splint placed on the patient.  However, she did remove this shortly after placement.  She is adamant that she does not think that she has a fracture in her wrist.  Fractures fortunately not significantly displaced on x-Samantha Carpenter.  I do not think the benefit of sedating the patient to keep the splint in place outweigh the risks and would not be sustainable for the duration of fracture healing.  Will send patient home with supplies  to replace splint, but plan for discharge currently.  Strict return precautions provided.      FINAL CLINICAL IMPRESSION(S) / ED DIAGNOSES   Final diagnoses:  Closed head injury, initial encounter  Closed fracture of right wrist, initial encounter     Rx / DC Orders   ED Discharge Orders     None        Note:  This document was prepared using Dragon voice recognition software and may include unintentional dictation errors.   [name], MD 10/14/22 [phone]

## 2022-10-14 NOTE — Discharge Instructions (Addendum)
You were seen in the ER after a fall.  Your x-[name] of your wrist was concerning for fracture.  We did place a splint on you, but you did not wish to keep this in place.  Please follow-up with orthopedics for further evaluation.  Return to the ER for new or worsening symptoms.

## 2022-10-14 NOTE — ED Notes (Signed)
called to [facility] for pt transport to facility[facility]/rep:jenna .

## 2022-10-14 NOTE — ED Notes (Addendum)
Patient is resting comfortably with bed in lowest position to the floor. Bed alarm activate and fall risk sign placed on door.

## 2022-10-14 NOTE — ED Notes (Signed)
Pt woke up, walked to toilet with assistance. EMS arrived for transport to facility. Wrist splint re wrapped with ace wrap.

## 2022-10-14 NOTE — ED Notes (Signed)
Pt sleeping with unlabored respirations. Pending transport to [facility].

## 2022-10-20 ENCOUNTER — Other Ambulatory Visit: Payer: Self-pay

## 2022-10-20 ENCOUNTER — Emergency Department: Payer: Medicare HMO

## 2022-10-20 ENCOUNTER — Emergency Department
Admission: EM | Admit: 2022-10-20 | Discharge: 2022-10-20 | Disposition: A | Discharge status: Home / Self Care | Payer: Medicare HMO | Attending: Emergency Medicine | Admitting: Emergency Medicine

## 2022-10-20 DIAGNOSIS — N189 Chronic kidney disease, unspecified: Secondary | ICD-10-CM | POA: Insufficient documentation

## 2022-10-20 DIAGNOSIS — J449 Chronic obstructive pulmonary disease, unspecified: Secondary | ICD-10-CM | POA: Insufficient documentation

## 2022-10-20 DIAGNOSIS — S52614D Nondisplaced fracture of right ulna styloid process, subsequent encounter for closed fracture with routine healing: Secondary | ICD-10-CM | POA: Insufficient documentation

## 2022-10-20 DIAGNOSIS — S62114D Nondisplaced fracture of triquetrum [cuneiform] bone, right wrist, subsequent encounter for fracture with routine healing: Secondary | ICD-10-CM | POA: Insufficient documentation

## 2022-10-20 DIAGNOSIS — W19XXXD Unspecified fall, subsequent encounter: Secondary | ICD-10-CM | POA: Diagnosis not present

## 2022-10-20 DIAGNOSIS — S6991XD Unspecified injury of right wrist, hand and finger(s), subsequent encounter: Secondary | ICD-10-CM | POA: Diagnosis present

## 2022-10-20 DIAGNOSIS — F0390 Unspecified dementia without behavioral disturbance: Secondary | ICD-10-CM | POA: Insufficient documentation

## 2022-10-20 DIAGNOSIS — I129 Hypertensive chronic kidney disease with stage 1 through stage 4 chronic kidney disease, or unspecified chronic kidney disease: Secondary | ICD-10-CM | POA: Diagnosis not present

## 2022-10-20 NOTE — Discharge Instructions (Signed)
Your x-rays show that the fracture of your right wrist is unchanged.  Please wear the wrist brace at all times until seen by orthopedics.  I have attached information for Dr. Ernest Pine who is the orthopedic doctor.  Please schedule an appointment with him for next week.  You can take Tylenol as needed for your pain.
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
## 2022-10-20 NOTE — ED Notes (Signed)
Report given to [place] at [facility] [address]
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
## 2022-10-29 ENCOUNTER — Emergency Department: Payer: Medicare HMO

## 2022-10-29 ENCOUNTER — Other Ambulatory Visit: Payer: Self-pay

## 2022-10-29 ENCOUNTER — Emergency Department
Admission: EM | Admit: 2022-10-29 | Discharge: 2022-10-29 | Disposition: A | Discharge status: Home / Self Care | Payer: Medicare HMO | Attending: Emergency Medicine | Admitting: Emergency Medicine

## 2022-10-29 DIAGNOSIS — R300 Dysuria: Secondary | ICD-10-CM | POA: Insufficient documentation

## 2022-10-29 DIAGNOSIS — X58XXXA Exposure to other specified factors, initial encounter: Secondary | ICD-10-CM | POA: Diagnosis not present

## 2022-10-29 DIAGNOSIS — J449 Chronic obstructive pulmonary disease, unspecified: Secondary | ICD-10-CM | POA: Diagnosis not present

## 2022-10-29 DIAGNOSIS — Z20822 Contact with and (suspected) exposure to covid-19: Secondary | ICD-10-CM | POA: Insufficient documentation

## 2022-10-29 DIAGNOSIS — N189 Chronic kidney disease, unspecified: Secondary | ICD-10-CM | POA: Insufficient documentation

## 2022-10-29 DIAGNOSIS — R350 Frequency of micturition: Secondary | ICD-10-CM | POA: Diagnosis not present

## 2022-10-29 DIAGNOSIS — Y92129 Unspecified place in nursing home as the place of occurrence of the external cause: Secondary | ICD-10-CM | POA: Insufficient documentation

## 2022-10-29 DIAGNOSIS — S0083XA Contusion of other part of head, initial encounter: Secondary | ICD-10-CM | POA: Insufficient documentation

## 2022-10-29 DIAGNOSIS — S0990XA Unspecified injury of head, initial encounter: Secondary | ICD-10-CM | POA: Diagnosis present

## 2022-10-29 DIAGNOSIS — M542 Cervicalgia: Secondary | ICD-10-CM | POA: Insufficient documentation

## 2022-10-29 DIAGNOSIS — F03C0 Unspecified dementia, severe, without behavioral disturbance, psychotic disturbance, mood disturbance, and anxiety: Secondary | ICD-10-CM | POA: Diagnosis not present

## 2022-10-29 DIAGNOSIS — I129 Hypertensive chronic kidney disease with stage 1 through stage 4 chronic kidney disease, or unspecified chronic kidney disease: Secondary | ICD-10-CM | POA: Insufficient documentation

## 2022-10-29 LAB — URINALYSIS, W/ REFLEX TO CULTURE (INFECTION SUSPECTED)
Bilirubin Urine: NEGATIVE
Glucose, UA: NEGATIVE mg/dL
Hgb urine dipstick: NEGATIVE
Ketones, ur: NEGATIVE mg/dL
Nitrite: NEGATIVE
Protein, ur: NEGATIVE mg/dL
Specific Gravity, Urine: 1.013 (ref 1.005–1.030)
pH: 6 (ref 5.0–8.0)

## 2022-10-29 LAB — COMPREHENSIVE METABOLIC PANEL
ALT: 17 U/L (ref 0–44)
AST: 23 U/L (ref 15–41)
Albumin: 4.6 g/dL (ref 3.5–5.0)
Alkaline Phosphatase: 101 U/L (ref 38–126)
Anion gap: 11 (ref 5–15)
BUN: 19 mg/dL (ref 8–23)
CO2: 29 mmol/L (ref 22–32)
Calcium: 9.3 mg/dL (ref 8.9–10.3)
Chloride: 97 mmol/L — ABNORMAL LOW (ref 98–111)
Creatinine, Ser: 1.05 mg/dL — ABNORMAL HIGH (ref 0.44–1.00)
GFR, Estimated: 53 mL/min — ABNORMAL LOW (ref >60)
Glucose, Bld: 114 mg/dL — ABNORMAL HIGH (ref 70–99)
Potassium: 4 mmol/L (ref 3.5–5.1)
Sodium: 137 mmol/L (ref 135–145)
Total Bilirubin: 0.8 mg/dL (ref 0.3–1.2)
Total Protein: 7.8 g/dL (ref 6.5–8.1)

## 2022-10-29 LAB — CBC
HCT: 43.9 % (ref 36.0–46.0)
Hemoglobin: 13.9 g/dL (ref 12.0–15.0)
MCH: 30.5 pg (ref 26.0–34.0)
MCHC: 31.7 g/dL (ref 30.0–36.0)
MCV: 96.5 fL (ref 80.0–100.0)
Platelets: 227 10*3/uL (ref 150–400)
RBC: 4.55 MIL/uL (ref 3.87–5.11)
RDW: 13.4 % (ref 11.5–15.5)
WBC: 7.7 10*3/uL (ref 4.0–10.5)
nRBC: 0 % (ref 0.0–0.2)

## 2022-10-29 LAB — SARS CORONAVIRUS 2 BY RT PCR: SARS Coronavirus 2 by RT PCR: NEGATIVE

## 2022-10-29 MED ORDER — IBUPROFEN 400 MG PO TABS
400.0000 mg | ORAL_TABLET | Freq: Once | ORAL | Status: AC
  Administered 2022-10-29: 400 mg via ORAL
  Filled 2022-10-29: qty 1

## 2022-10-29 MED ORDER — ACETAMINOPHEN 500 MG PO TABS
1000.0000 mg | ORAL_TABLET | Freq: Once | ORAL | Status: AC
  Administered 2022-10-29: 1000 mg via ORAL
  Filled 2022-10-29: qty 2

## 2022-10-29 MED ORDER — SODIUM CHLORIDE 0.9 % IV BOLUS: 1000.0000 mL | Freq: Once | INTRAVENOUS | Status: DC

## 2022-10-29 NOTE — ED Notes (Signed)
First Nurse Note: Patient to ED via [facility] from the [facility] [place] for [facility]. Patient has hx of dementia- not acting like self per staff and holding the back of her neck. Bruising to forehead- facility  denies any recent falls. VS WNL with EMS.

## 2022-10-29 NOTE — Discharge Instructions (Signed)
Your lab tests and imaging today are all okay.  Please follow-up with your doctor for continued evaluation of your symptoms.

## 2022-10-29 NOTE — ED Notes (Signed)
[facility]  CALLED  FOR  TRANSPORT  TO  [facility] 

## 2022-10-29 NOTE — ED Notes (Signed)
Report called to [facility] [address]

## 2022-10-29 NOTE — ED Provider Notes (Signed)
[facility] Provider Note    Event Date/Time   First MD Initiated Contact with Patient 10/29/22 1503     (approximate)   History   Chief Complaint: Altered Mental Status   HPI  Samantha Carpenter is a 82 y.o. female with a history of hypertension CKD COPD dementia who was sent to the ED from her skilled nursing facility due to "not acting herself" today.  No focal complaints reported by EMS. To me, patient complains of neck pain.  She also notes pain at her forehead where she has a bruise.  She says the bruise happened a few weeks ago.  Denies any recent falls.  No chest pain or shortness of breath or belly pain.  She does endorse dysuria and urinary frequency.      Physical Exam   Triage Vital Signs: ED Triage Vitals  Encounter Vitals Group     BP 10/29/22 1343 (!) 161/63     Systolic BP Percentile --      Diastolic BP Percentile --      Pulse Rate 10/29/22 1343 (!) 55     Resp 10/29/22 1343 20     Temp 10/29/22 1343 98.7 F (37.1 C)     Temp Source 10/29/22 1343 Oral     SpO2 10/29/22 1343 93 %     Weight --      Height 10/29/22 1345 5\' 4"  (1.626 m)     Head Circumference --      Peak Flow --      Pain Score 10/29/22 1344 6     Pain Loc --      Pain Education --      Exclude from Growth Chart --     Most recent vital signs: Vitals:   10/29/22 1343  BP: (!) 161/63  Pulse: (!) 55  Resp: 20  Temp: 98.7 F (37.1 C)  SpO2: 93%    General: Awake, no distress.  CV:  Good peripheral perfusion.  Regular rate and rhythm, normal distal pulses Resp:  Normal effort.  Clear to auscultation bilaterally Abd:  No distention.  Soft nontender Other:  No lower extremity edema.  Moist oral mucosa.  There is bruising on the left forehead without laceration or hematoma.   ED Results / Procedures / Treatments   Labs (all labs ordered are listed, but only abnormal results are displayed) Labs Reviewed  COMPREHENSIVE METABOLIC PANEL - Abnormal; Notable  for the following components:      Result Value   Chloride 97 (*)    Glucose, Bld 114 (*)    Creatinine, Ser 1.05 (*)    GFR, Estimated 53 (*)    All other components within normal limits  URINALYSIS, W/ REFLEX TO CULTURE (INFECTION SUSPECTED) - Abnormal; Notable for the following components:   Color, Urine YELLOW (*)    APPearance CLEAR (*)    Leukocytes,Ua TRACE (*)    Bacteria, UA RARE (*)    All other components within normal limits  SARS CORONAVIRUS 2 BY RT PCR  CBC  CBG MONITORING, ED     EKG    RADIOLOGY CT head interpreted by me, appears unremarkable.  Radiology report reviewed.  CT cervical spine unremarkable.  Chest x-ray unremarkable.   PROCEDURES:  Procedures   MEDICATIONS ORDERED IN ED: Medications  acetaminophen (TYLENOL) tablet 1,000 mg (1,000 mg Oral Given 10/29/22 1642)  ibuprofen (ADVIL) tablet 400 mg (400 mg Oral Given 10/29/22 1642)     IMPRESSION / MDM /  ASSESSMENT AND PLAN / ED COURSE  I reviewed the triage vital signs and the nursing notes.  DDx: Intracranial hemorrhage, C-spine fracture, concussion, AKI, electrolyte abnormality, anemia, UTI, pneumonia, COVID  Patient's presentation is most consistent with acute presentation with potential threat to life or bodily function.  Patient presents with concern for altered mental status in the setting of chronic dementia.  Has evidence of subacute head trauma.  Patient history is limited.  Will check labs, CT head and cervical spine, chest x-ray.   Clinical Course as of 10/29/22 1830  Mon Oct 29, 2022  1655 CT Cervical Spine Wo Contrast [PS]    Clinical Course User Index [PS] Sharman Cheek, MD    ----------------------------------------- 6:30 PM on 10/29/2022 ----------------------------------------- Workup reassuring.  Stable for discharge and outpatient follow-up.   FINAL CLINICAL IMPRESSION(S) / ED DIAGNOSES   Final diagnoses:  Severe dementia, unspecified dementia type,  unspecified whether behavioral, psychotic, or mood disturbance or anxiety (HCC)     Rx / DC Orders   ED Discharge Orders     None        Note:  This document was prepared using Dragon voice recognition software and may include unintentional dictation errors.   Sharman Cheek, MD 10/29/22 [phone]

## 2022-10-29 NOTE — ED Triage Notes (Signed)
See first nurse note. 

## 2022-10-31 ENCOUNTER — Other Ambulatory Visit: Payer: Self-pay

## 2022-10-31 ENCOUNTER — Emergency Department: Payer: Medicare HMO

## 2022-10-31 ENCOUNTER — Emergency Department
Admission: EM | Admit: 2022-10-31 | Discharge: 2022-10-31 | Disposition: A | Discharge status: Home / Self Care | Payer: Medicare HMO | Attending: Emergency Medicine | Admitting: Emergency Medicine

## 2022-10-31 DIAGNOSIS — M542 Cervicalgia: Secondary | ICD-10-CM | POA: Insufficient documentation

## 2022-10-31 DIAGNOSIS — S0990XA Unspecified injury of head, initial encounter: Secondary | ICD-10-CM | POA: Diagnosis present

## 2022-10-31 DIAGNOSIS — F0390 Unspecified dementia without behavioral disturbance: Secondary | ICD-10-CM | POA: Diagnosis not present

## 2022-10-31 DIAGNOSIS — S0083XA Contusion of other part of head, initial encounter: Secondary | ICD-10-CM | POA: Insufficient documentation

## 2022-10-31 DIAGNOSIS — W19XXXA Unspecified fall, initial encounter: Secondary | ICD-10-CM | POA: Insufficient documentation

## 2022-10-31 DIAGNOSIS — E876 Hypokalemia: Secondary | ICD-10-CM | POA: Diagnosis not present

## 2022-10-31 LAB — CBC
HCT: 39.3 % (ref 36.0–46.0)
Hemoglobin: 12.8 g/dL (ref 12.0–15.0)
MCH: 30.8 pg (ref 26.0–34.0)
MCHC: 32.6 g/dL (ref 30.0–36.0)
MCV: 94.5 fL (ref 80.0–100.0)
Platelets: 210 10*3/uL (ref 150–400)
RBC: 4.16 MIL/uL (ref 3.87–5.11)
RDW: 13.2 % (ref 11.5–15.5)
WBC: 6.9 10*3/uL (ref 4.0–10.5)
nRBC: 0 % (ref 0.0–0.2)

## 2022-10-31 LAB — BASIC METABOLIC PANEL
Anion gap: 9 (ref 5–15)
BUN: 16 mg/dL (ref 8–23)
CO2: 30 mmol/L (ref 22–32)
Calcium: 8.9 mg/dL (ref 8.9–10.3)
Chloride: 99 mmol/L (ref 98–111)
Creatinine, Ser: 0.9 mg/dL (ref 0.44–1.00)
GFR, Estimated: >60 mL/min (ref >60)
Glucose, Bld: 114 mg/dL — ABNORMAL HIGH (ref 70–99)
Potassium: 3.1 mmol/L — ABNORMAL LOW (ref 3.5–5.1)
Sodium: 138 mmol/L (ref 135–145)

## 2022-10-31 NOTE — Discharge Instructions (Addendum)
Seen at the emergency department for a fall.  She had a CT scan of her head and her neck that did not show any broken bones.  Her lab work was normal.  Continue to monitor for any falls.  PT and OT evaluation recommended if not already following with her.

## 2022-10-31 NOTE — ED Provider Notes (Signed)
[facility] Provider Note    Event Date/Time   First MD Initiated Contact with Patient 10/31/22 1620     (approximate)   History   Fall   HPI  Samantha Carpenter is a 82 y.o. female past medical history significant for dementia, who presents to the emergency department with a fall.  Patient is at [facility] and presented to the emergency department after a fall.  Uncertain if there was loss of consciousness but patient was found to have trauma to her head.  Complaining of pain to her neck in triage.  Patient complaining to me to get the cervical spine collar off of her.  Asking for [employer].     Physical Exam   Triage Vital Signs: ED Triage Vitals [10/31/22 1602]  Encounter Vitals Group     BP (!) 189/72     Systolic BP Percentile      Diastolic BP Percentile      Pulse Rate 62     Resp 16     Temp 98 F (36.7 C)     Temp Source Oral     SpO2 92 %     Weight      Height      Head Circumference      Peak Flow      Pain Score      Pain Loc      Pain Education      Exclude from Growth Chart     Most recent vital signs: Vitals:   10/31/22 1602  BP: (!) 189/72  Pulse: 62  Resp: 16  Temp: 98 F (36.7 C)  SpO2: 92%    Physical Exam Constitutional:      Appearance: She is well-developed.  HENT:     Head:     Comments: Hematoma to the left forehead Eyes:     Extraocular Movements: Extraocular movements intact.     Conjunctiva/sclera: Conjunctivae normal.  Neck:     Comments: Cervical collar in place Cardiovascular:     Rate and Rhythm: Regular rhythm.  Pulmonary:     Effort: No respiratory distress.  Abdominal:     General: There is no distension.     Tenderness: There is no abdominal tenderness.  Musculoskeletal:        General: Normal range of motion.     Cervical back: Normal range of motion and neck supple. No tenderness.     Comments: No midline thoracic or lumbar tenderness to palpation.  No pain or tenderness  palpation to the chest wall.  Moving bilateral upper and lower extremities.  No tenderness to palpation to bilateral upper or lower extremities.  No tenderness palpation to bilateral hips.  Skin:    General: Skin is warm.     Capillary Refill: Capillary refill takes less than 2 seconds.  Neurological:     Mental Status: She is alert. Mental status is at baseline.     IMPRESSION / MDM / ASSESSMENT AND PLAN / ED COURSE  I reviewed the triage vital signs and the nursing notes.  Differential diagnosis including concussion, intracranial hemorrhage, cervical spine fracture, dehydration, electrolyte abnormality  EKG  I, Corena Herter, the attending physician, personally viewed and interpreted this ECG.   Rate: Normal  Rhythm: Normal sinus  Axis: Normal  Intervals: Normal  ST&T Change: None.  Significant artifact  No tachycardic or bradycardic dysrhythmias while on cardiac telemetry.  RADIOLOGY my interpretation of imaging: CT scan of the head without signs  of intracranial hemorrhage.  CT scan of the head and CT scan of the cervical spine without acute traumatic injury on read  LABS (all labs ordered are listed, but only abnormal results are displayed) Labs interpreted as -    Labs Reviewed  BASIC METABOLIC PANEL - Abnormal; Notable for the following components:      Result Value   Potassium 3.1 (*)    Glucose, Bld 114 (*)    All other components within normal limits  CBC     MDM  Lab work overall reassuring, mild hypokalemia.  Glucose within normal limits.  No significant anemia.  Patient appears to be at her mental status baseline.  Moving all extremities and has no focal exam findings consistent with a CVA.  No tenderness to bilateral pelvis and no other bony tenderness.  Drinking a Coca[employer] without any difficulties.  Will notified facility.  Denies dysuria.     PROCEDURES:  Critical Care performed: No  Procedures  Patient's presentation is most consistent with  acute complicated illness / injury requiring diagnostic workup.   MEDICATIONS ORDERED IN ED: Medications - No data to display  FINAL CLINICAL IMPRESSION(S) / ED DIAGNOSES   Final diagnoses:  Fall, initial encounter     Rx / DC Orders   ED Discharge Orders     None        Note:  This document was prepared using Dragon voice recognition software and may include unintentional dictation errors.   Corena Herter, MD 10/31/22 1729

## 2022-10-31 NOTE — ED Notes (Signed)
Patient assisted to restroom and helped back to strecther

## 2022-10-31 NOTE — ED Triage Notes (Signed)
Pt reports neck pain and per ems has had multiple falls-c-collar applied. Pt is in recliner in triage area

## 2022-10-31 NOTE — ED Triage Notes (Signed)
FIRST RN: pt from the [facility] [place] via [facility] with reports of multiple falls. Pt has dementia so unsure if LOC. Pt has knot to back of head.    190/166 95RA 60HR

## 2022-11-03 ENCOUNTER — Emergency Department
Admission: EM | Admit: 2022-11-03 | Discharge: 2022-11-03 | Disposition: A | Discharge status: Home / Self Care | Payer: Medicare HMO | Attending: Emergency Medicine | Admitting: Emergency Medicine

## 2022-11-03 ENCOUNTER — Emergency Department: Payer: Medicare HMO

## 2022-11-03 ENCOUNTER — Other Ambulatory Visit: Payer: Self-pay

## 2022-11-03 DIAGNOSIS — I5033 Acute on chronic diastolic (congestive) heart failure: Secondary | ICD-10-CM | POA: Insufficient documentation

## 2022-11-03 DIAGNOSIS — J449 Chronic obstructive pulmonary disease, unspecified: Secondary | ICD-10-CM | POA: Diagnosis not present

## 2022-11-03 DIAGNOSIS — F0390 Unspecified dementia without behavioral disturbance: Secondary | ICD-10-CM | POA: Insufficient documentation

## 2022-11-03 DIAGNOSIS — W050XXA Fall from non-moving wheelchair, initial encounter: Secondary | ICD-10-CM | POA: Diagnosis not present

## 2022-11-03 DIAGNOSIS — Z8616 Personal history of COVID-19: Secondary | ICD-10-CM | POA: Insufficient documentation

## 2022-11-03 DIAGNOSIS — S0990XA Unspecified injury of head, initial encounter: Secondary | ICD-10-CM

## 2022-11-03 DIAGNOSIS — I130 Hypertensive heart and chronic kidney disease with heart failure and stage 1 through stage 4 chronic kidney disease, or unspecified chronic kidney disease: Secondary | ICD-10-CM | POA: Diagnosis not present

## 2022-11-03 DIAGNOSIS — W19XXXA Unspecified fall, initial encounter: Secondary | ICD-10-CM

## 2022-11-03 DIAGNOSIS — S0083XA Contusion of other part of head, initial encounter: Secondary | ICD-10-CM | POA: Diagnosis not present

## 2022-11-03 DIAGNOSIS — N183 Chronic kidney disease, stage 3 unspecified: Secondary | ICD-10-CM | POA: Insufficient documentation

## 2022-11-03 NOTE — Discharge Instructions (Addendum)
Your CT scans were negative. Please return for any new, worsening, or change in symptoms or other concerns.  It was a pleasure caring for you today.

## 2022-11-03 NOTE — ED Provider Notes (Signed)
[facility] Provider Note    Event Date/Time   First MD Initiated Contact with Patient 11/03/22 1244     (approximate)   History   Fall   HPI  Samantha Carpenter is a 82 y.o. female with a past medical history of hypertension, CKD, COPD, dementia at the memory care unit of [facility] who presents today for evaluation after a fall.  Per EMS, the patient was sitting in the wheelchair when the back of the wheelchair gave out and she fell backwards, and struck her head.  There was no loss of consciousness.  Patient declines any pain currently.  There is been no change in mental status according to EMS.  She is not on anticoagulation.  Patient Active Problem List   Diagnosis Date Noted   Acute hyponatremia 04/23/2022   Acute on chronic diastolic CHF (congestive heart failure) (HCC) 04/16/2022   Hyponatremia 04/16/2022   Hypothermia 04/16/2022   Hypomagnesemia 02/15/2022   RSV (respiratory syncytial virus pneumonia) 02/15/2022   COPD with acute exacerbation (HCC) 02/10/2022   Hypokalemia 02/10/2022   Falls frequently 02/10/2022   Hypoglycemia 01/11/2022   AKI (acute kidney injury) (HCC) 01/10/2022   Pressure injury of skin 01/09/2022   COPD exacerbation (HCC) 01/08/2022   Prediabetes 01/08/2022   Dementia with behavioral disturbance (HCC) 11/15/2021   Hypoxia 11/15/2021   COVID-19 virus infection 11/14/2021   Right rib fracture 11/09/2020   Fall at home, initial encounter 11/09/2020   COPD (chronic obstructive pulmonary disease) (HCC) 11/09/2020   Depression with anxiety 11/09/2020   Elevated troponin 11/09/2020   Acute respiratory failure with hypoxia (HCC) 11/09/2020   HTN (hypertension) 11/09/2020   Abrasions of multiple sites    Pure hypercholesterolemia 06/09/2018   Recurrent major depressive disorder, 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   HTN, goal below 140/80 04/28/2018   CKD (chronic kidney disease)  stage 3, GFR 30-59 ml/min (HCC) 04/28/2018          Physical Exam   Triage Vital Signs: ED Triage Vitals  Encounter Vitals Group     BP 11/03/22 1249 (!) 156/60     Systolic BP Percentile --      Diastolic BP Percentile --      Pulse Rate 11/03/22 1249 63     Resp 11/03/22 1249 18     Temp 11/03/22 1247 98.4 F (36.9 C)     Temp Source 11/03/22 1247 Oral     SpO2 11/03/22 1249 93 %     Weight 11/03/22 1247 147 lb 11.3 oz (67 kg)     Height 11/03/22 1247 5\' 4"  (1.626 m)     Head Circumference --      Peak Flow --      Pain Score 11/03/22 1247 6     Pain Loc --      Pain Education --      Exclude from Growth Chart --     Most recent vital signs: Vitals:   11/03/22 1249 11/03/22 1546  BP: (!) 156/60 (!) 186/71  Pulse: 63 66  Resp: 18   Temp:    SpO2: 93% 99%    Physical Exam Vitals and nursing note reviewed.  Constitutional:      General: Awake and alert. No acute distress.    Appearance: Normal appearance. The patient is normal weight.  HENT:     Head: Normocephalic.  Old appearing area of ecchymosis to her left forehead.  No hematoma  Mouth: Mucous membranes are moist.  Eyes:     General: PERRL. Normal EOMs        Right eye: No discharge.        Left eye: No discharge.     Conjunctiva/sclera: Conjunctivae normal.  Cardiovascular:     Rate and Rhythm: Normal rate and regular rhythm.     Pulses: Normal pulses.  Pulmonary:     Effort: Pulmonary effort is normal. No respiratory distress.     Breath sounds: Normal breath sounds.  No chest wall tenderness Abdominal:     Abdomen is soft. There is no abdominal tenderness. No rebound or guarding. No distention. Musculoskeletal:        General: No swelling. Normal range of motion.     Cervical back: Normal range of motion and neck supple. No midline cervical spine tenderness.  Full range of motion of neck.  Negative Spurling test.  Negative Lhermitte sign.  Normal strength and sensation in bilateral upper  extremities. Normal grip strength bilaterally.  Normal intrinsic muscle function of the hand bilaterally.  Normal radial pulses bilaterally. Pelvis stable.  Negative logroll of the hip bilaterally.  Able to lift both legs up off of the stretcher without pain.  No pain with passive range of motion of bilateral hips, knees, ankles.  No pain with passive range of motion of bilateral shoulders, elbows, wrists.  Normal active range of motion of full upper and lower extremities. Skin:    General: Skin is warm and dry.     Capillary Refill: Capillary refill takes less than 2 seconds.     Findings: No rash.  Neurological:     Mental Status: The patient is awake and alert.  Moving all 4 extremities normally, face is symmetric, at her baseline.     ED Results / Procedures / Treatments   Labs (all labs ordered are listed, but only abnormal results are displayed) Labs Reviewed - No data to display   EKG     RADIOLOGY I independently reviewed and interpreted imaging and agree with radiologists findings.     PROCEDURES:  Critical Care performed:   Procedures   MEDICATIONS ORDERED IN ED: Medications - No data to display   IMPRESSION / MDM / ASSESSMENT AND PLAN / ED COURSE  I reviewed the triage vital signs and the nursing notes.   Differential diagnosis includes, but is not limited to, intracranial hemorrhage, fracture, dislocation, cervical spine injury.  Patient is awake and alert, hemodynamically stable and afebrile.  She is at her mental baseline per EMS.  There was no syncope, chest pain, or preceding events, patient really fell out of the chair because the back of the chair gave out.  I reviewed the patient's chart.  She has been seen multiple times in the past for similar problems and has known advanced dementia.  She has no pain with range of motion of her upper and lower extremities with active and passive range of motion.  There is no evidence of trauma on exam.  She is  able to move all of her limbs normally.  She is at her mental baseline.  CT head and neck obtained per [place] criteria are negative for any acute findings.  Lumbar spine was also obtained given that she landed on her back, and this was negative as well.  There is no thoracic spine tenderness on exam.  RN discussed with her nursing home the results and they feel comfortable taking her back.  Patient was escorted back  by EMS.  We discussed return precautions and outpatient follow-up and this was placed on her paperwork for her facility to see as well.   Patient's presentation is most consistent with acute complicated illness / injury requiring diagnostic workup.    FINAL CLINICAL IMPRESSION(S) / ED DIAGNOSES   Final diagnoses:  Fall, initial encounter  Injury of head, initial encounter     Rx / DC Orders   ED Discharge Orders     None        Note:  This document was prepared using Dragon voice recognition software and may include unintentional dictation errors.   Jackelyn Hoehn, PA-C 11/03/22 1842    Janith Lima, MD 11/03/22 [phone]

## 2022-11-03 NOTE — ED Triage Notes (Signed)
Pt from [facility] [address] and had a witnessed fall out of the wheelchair. The back of the wheelchair collapsed and pt fell backwards but did not hit head per staff or have LOC. Pt complaining of back hip and head. EMS vitals: BP 132/52 HR 57 93% RA CBG 174

## 2022-11-06 ENCOUNTER — Emergency Department: Payer: Medicare HMO

## 2022-11-06 ENCOUNTER — Observation Stay
Admission: EM | Admit: 2022-11-06 | Discharge: 2022-11-08 | Disposition: A | Discharge status: Skilled Nursing Facility | Payer: Medicare HMO | Attending: Osteopathic Medicine | Admitting: Osteopathic Medicine

## 2022-11-06 ENCOUNTER — Other Ambulatory Visit: Payer: Self-pay

## 2022-11-06 DIAGNOSIS — F0390 Unspecified dementia without behavioral disturbance: Secondary | ICD-10-CM | POA: Diagnosis not present

## 2022-11-06 DIAGNOSIS — R7989 Other specified abnormal findings of blood chemistry: Secondary | ICD-10-CM | POA: Insufficient documentation

## 2022-11-06 DIAGNOSIS — I5032 Chronic diastolic (congestive) heart failure: Secondary | ICD-10-CM | POA: Insufficient documentation

## 2022-11-06 DIAGNOSIS — W050XXA Fall from non-moving wheelchair, initial encounter: Secondary | ICD-10-CM | POA: Diagnosis not present

## 2022-11-06 DIAGNOSIS — I110 Hypertensive heart disease with heart failure: Secondary | ICD-10-CM | POA: Insufficient documentation

## 2022-11-06 DIAGNOSIS — Z91040 Latex allergy status: Secondary | ICD-10-CM | POA: Diagnosis not present

## 2022-11-06 DIAGNOSIS — Z7982 Long term (current) use of aspirin: Secondary | ICD-10-CM | POA: Diagnosis not present

## 2022-11-06 DIAGNOSIS — Z1152 Encounter for screening for COVID-19: Secondary | ICD-10-CM | POA: Insufficient documentation

## 2022-11-06 DIAGNOSIS — Z79899 Other long term (current) drug therapy: Secondary | ICD-10-CM | POA: Insufficient documentation

## 2022-11-06 DIAGNOSIS — S0990XA Unspecified injury of head, initial encounter: Secondary | ICD-10-CM | POA: Insufficient documentation

## 2022-11-06 DIAGNOSIS — J441 Chronic obstructive pulmonary disease with (acute) exacerbation: Principal | ICD-10-CM | POA: Insufficient documentation

## 2022-11-06 DIAGNOSIS — Y92009 Unspecified place in unspecified non-institutional (private) residence as the place of occurrence of the external cause: Secondary | ICD-10-CM

## 2022-11-06 DIAGNOSIS — Z87891 Personal history of nicotine dependence: Secondary | ICD-10-CM | POA: Insufficient documentation

## 2022-11-06 DIAGNOSIS — F418 Other specified anxiety disorders: Secondary | ICD-10-CM | POA: Diagnosis present

## 2022-11-06 DIAGNOSIS — I10 Essential (primary) hypertension: Secondary | ICD-10-CM | POA: Diagnosis present

## 2022-11-06 DIAGNOSIS — W19XXXA Unspecified fall, initial encounter: Secondary | ICD-10-CM

## 2022-11-06 DIAGNOSIS — F03918 Unspecified dementia, unspecified severity, with other behavioral disturbance: Secondary | ICD-10-CM | POA: Diagnosis present

## 2022-11-06 LAB — COMPREHENSIVE METABOLIC PANEL
ALT: 17 U/L (ref 0–44)
AST: 20 U/L (ref 15–41)
Albumin: 3.6 g/dL (ref 3.5–5.0)
Alkaline Phosphatase: 86 U/L (ref 38–126)
Anion gap: 11 (ref 5–15)
BUN: 14 mg/dL (ref 8–23)
CO2: 31 mmol/L (ref 22–32)
Calcium: 8.8 mg/dL — ABNORMAL LOW (ref 8.9–10.3)
Chloride: 94 mmol/L — ABNORMAL LOW (ref 98–111)
Creatinine, Ser: 0.82 mg/dL (ref 0.44–1.00)
GFR, Estimated: >60 mL/min (ref >60)
Glucose, Bld: 158 mg/dL — ABNORMAL HIGH (ref 70–99)
Potassium: 3.5 mmol/L (ref 3.5–5.1)
Sodium: 136 mmol/L (ref 135–145)
Total Bilirubin: 0.7 mg/dL (ref 0.3–1.2)
Total Protein: 6.9 g/dL (ref 6.5–8.1)

## 2022-11-06 LAB — CBC WITH DIFFERENTIAL/PLATELET
Abs Immature Granulocytes: 0.02 10*3/uL (ref 0.00–0.07)
Basophils Absolute: 0.1 10*3/uL (ref 0.0–0.1)
Basophils Relative: 1 %
Eosinophils Absolute: 0.6 10*3/uL — ABNORMAL HIGH (ref 0.0–0.5)
Eosinophils Relative: 7 %
HCT: 36.2 % (ref 36.0–46.0)
Hemoglobin: 12 g/dL (ref 12.0–15.0)
Immature Granulocytes: 0 %
Lymphocytes Relative: 10 %
Lymphs Abs: 0.8 10*3/uL (ref 0.7–4.0)
MCH: 30.9 pg (ref 26.0–34.0)
MCHC: 33.1 g/dL (ref 30.0–36.0)
MCV: 93.3 fL (ref 80.0–100.0)
Monocytes Absolute: 0.8 10*3/uL (ref 0.1–1.0)
Monocytes Relative: 10 %
Neutro Abs: 6.3 10*3/uL (ref 1.7–7.7)
Neutrophils Relative %: 72 %
Platelets: 223 10*3/uL (ref 150–400)
RBC: 3.88 MIL/uL (ref 3.87–5.11)
RDW: 13.2 % (ref 11.5–15.5)
WBC: 8.6 10*3/uL (ref 4.0–10.5)
nRBC: 0 % (ref 0.0–0.2)

## 2022-11-06 LAB — SARS CORONAVIRUS 2 BY RT PCR: SARS Coronavirus 2 by RT PCR: NEGATIVE

## 2022-11-06 LAB — TROPONIN I (HIGH SENSITIVITY)
Troponin I (High Sensitivity): 21 ng/L — ABNORMAL HIGH (ref <18)
Troponin I (High Sensitivity): 19 ng/L — ABNORMAL HIGH (ref <18)

## 2022-11-06 MED ORDER — HALOPERIDOL LACTATE 5 MG/ML IJ SOLN: 1.0000 mg | INTRAMUSCULAR | Status: DC | PRN

## 2022-11-06 MED ORDER — ALBUTEROL SULFATE (2.5 MG/3ML) 0.083% IN NEBU
2.5000 mg | INHALATION_SOLUTION | Freq: Once | RESPIRATORY_TRACT | Status: AC
  Administered 2022-11-06: 2.5 mg via RESPIRATORY_TRACT
  Filled 2022-11-06: qty 3

## 2022-11-06 MED ORDER — METHYLPREDNISOLONE SODIUM SUCC 125 MG IJ SOLR
125.0000 mg | Freq: Once | INTRAMUSCULAR | Status: AC
  Administered 2022-11-06: 125 mg via INTRAMUSCULAR
  Filled 2022-11-06: qty 2

## 2022-11-06 MED ORDER — ASPIRIN 81 MG PO TBEC
81.0000 mg | DELAYED_RELEASE_TABLET | Freq: Every day | ORAL | Status: DC
  Administered 2022-11-08: 81 mg via ORAL
  Filled 2022-11-06 (×2): qty 1

## 2022-11-06 MED ORDER — QUETIAPINE FUMARATE 25 MG PO TABS: 50.0000 mg | ORAL_TABLET | Freq: Every day | ORAL | Status: DC

## 2022-11-06 MED ORDER — DULOXETINE HCL 60 MG PO CPEP
60.0000 mg | ORAL_CAPSULE | Freq: Every day | ORAL | Status: DC
  Administered 2022-11-08: 60 mg via ORAL
  Filled 2022-11-06: qty 2
  Filled 2022-11-06: qty 1
  Filled 2022-11-06: qty 2
  Filled 2022-11-06: qty 1

## 2022-11-06 MED ORDER — METHYLPREDNISOLONE SODIUM SUCC 40 MG IJ SOLR
40.0000 mg | Freq: Two times a day (BID) | INTRAMUSCULAR | Status: AC
  Administered 2022-11-07 (×2): 40 mg via INTRAVENOUS
  Filled 2022-11-06 (×2): qty 1

## 2022-11-06 MED ORDER — ACETAMINOPHEN 325 MG PO TABS: 650.0000 mg | ORAL_TABLET | Freq: Four times a day (QID) | ORAL | Status: DC | PRN

## 2022-11-06 MED ORDER — ACETAMINOPHEN 650 MG RE SUPP: 650.0000 mg | Freq: Four times a day (QID) | RECTAL | Status: DC | PRN

## 2022-11-06 MED ORDER — RIVASTIGMINE TARTRATE 1.5 MG PO CAPS
1.5000 mg | ORAL_CAPSULE | Freq: Two times a day (BID) | ORAL | Status: DC
  Administered 2022-11-07 – 2022-11-08 (×2): 1.5 mg via ORAL
  Filled 2022-11-06 (×4): qty 1

## 2022-11-06 MED ORDER — BUPROPION HCL ER (XL) 150 MG PO TB24: 150.0000 mg | ORAL_TABLET | Freq: Every day | ORAL | Status: DC

## 2022-11-06 MED ORDER — IPRATROPIUM-ALBUTEROL 0.5-2.5 (3) MG/3ML IN SOLN
3.0000 mL | Freq: Four times a day (QID) | RESPIRATORY_TRACT | Status: DC
  Administered 2022-11-07 – 2022-11-08 (×5): 3 mL via RESPIRATORY_TRACT
  Filled 2022-11-06 (×5): qty 3

## 2022-11-06 MED ORDER — LOSARTAN POTASSIUM 50 MG PO TABS
100.0000 mg | ORAL_TABLET | Freq: Every day | ORAL | Status: DC
  Administered 2022-11-08: 100 mg via ORAL
  Filled 2022-11-06 (×2): qty 2

## 2022-11-06 MED ORDER — ONDANSETRON HCL 4 MG PO TABS: 4.0000 mg | ORAL_TABLET | Freq: Four times a day (QID) | ORAL | Status: DC | PRN

## 2022-11-06 MED ORDER — METHYLPREDNISOLONE SODIUM SUCC 125 MG IJ SOLR: 125.0000 mg | Freq: Once | INTRAMUSCULAR | Status: DC

## 2022-11-06 MED ORDER — ONDANSETRON HCL 4 MG/2ML IJ SOLN: 4.0000 mg | Freq: Four times a day (QID) | INTRAMUSCULAR | Status: DC | PRN

## 2022-11-06 MED ORDER — BISOPROLOL FUMARATE 5 MG PO TABS
5.0000 mg | ORAL_TABLET | Freq: Every day | ORAL | Status: DC
  Administered 2022-11-08: 5 mg via ORAL
  Filled 2022-11-06 (×2): qty 1

## 2022-11-06 MED ORDER — ALBUTEROL SULFATE (2.5 MG/3ML) 0.083% IN NEBU: 2.5000 mg | INHALATION_SOLUTION | RESPIRATORY_TRACT | Status: DC | PRN

## 2022-11-06 MED ORDER — PREDNISONE 20 MG PO TABS
40.0000 mg | ORAL_TABLET | Freq: Every day | ORAL | Status: DC
  Administered 2022-11-08: 40 mg via ORAL
  Filled 2022-11-06: qty 2

## 2022-11-06 MED ORDER — AMLODIPINE BESYLATE 5 MG PO TABS
10.0000 mg | ORAL_TABLET | Freq: Every day | ORAL | Status: DC
  Administered 2022-11-08: 10 mg via ORAL
  Filled 2022-11-06 (×2): qty 2

## 2022-11-06 MED ORDER — FUROSEMIDE 40 MG PO TABS
40.0000 mg | ORAL_TABLET | Freq: Every day | ORAL | Status: DC
  Administered 2022-11-08: 40 mg via ORAL
  Filled 2022-11-06 (×2): qty 1

## 2022-11-06 MED ORDER — SPIRONOLACTONE 25 MG PO TABS
25.0000 mg | ORAL_TABLET | Freq: Every day | ORAL | Status: DC
  Administered 2022-11-08: 25 mg via ORAL
  Filled 2022-11-06 (×2): qty 1

## 2022-11-06 MED ORDER — FAMOTIDINE 20 MG PO TABS
20.0000 mg | ORAL_TABLET | Freq: Every day | ORAL | Status: DC
  Administered 2022-11-08: 20 mg via ORAL
  Filled 2022-11-06 (×2): qty 1

## 2022-11-06 MED ORDER — ENOXAPARIN SODIUM 40 MG/0.4ML IJ SOSY
40.0000 mg | PREFILLED_SYRINGE | INTRAMUSCULAR | Status: DC
  Administered 2022-11-08: 40 mg via SUBCUTANEOUS
  Filled 2022-11-06 (×2): qty 0.4

## 2022-11-06 NOTE — ED Notes (Signed)
First Nurse Note: Patient to ED via [facility] from the [facility] [place]. Pt fell back out of the wheelchair hitting the back of head. No LOC but did become more sleepy and lethargic. Hx of dementia.

## 2022-11-06 NOTE — ED Notes (Signed)
Pt. Has been repositioned in bed several times by staff after attempting to make her way to the end of the bed. Pt. Reminded that she needs to stay in bed.

## 2022-11-06 NOTE — Assessment & Plan Note (Signed)
BP slightly elevated Continue home meds of losartan, bisoprolol, Catapres and amlodipine

## 2022-11-06 NOTE — Assessment & Plan Note (Signed)
Mild hypoxia Patient with known COPD not on oxygen but has been on BiPAP with past exacerbations Scheduled and as needed nebulized bronchodilators IV steroids Supplemental oxygen to keep sats over 92%

## 2022-11-06 NOTE — Assessment & Plan Note (Addendum)
Patient reportedly restless getting out of bed the ED Continue quetiapine, rivastigmine Delirium precautions Blinds open during the day, closed at night, frequent reorientation, minimize nighttime interruptions When possible, minimize use of narcotics, benzodiazepines, anti cholingeneric, and antihistamine If delirium becomes problematic,Haldol 0.5-1 mg q6/prn.  Geodon , if needed - 10 mg q8 PRN severe agitation

## 2022-11-06 NOTE — ED Notes (Signed)
Pt. Attempting to scooch down bed, to get out. Pt. Is obviously confused, states, "I want to go... but I'm not going back there because she's there... I'm going to tell my mom that you aren't letting me get out of here." This RN attempted to reassure pt. Pt. Repositioned by this RN and Sherilyn Cooter, RN. Grip socks applied to pt's feet. Bed alarm turned on and active. Fall bracelet applied to pt. Pt. Repositioned for comfort. Staff in the area notified pt. Is a fall risk, room door left open for increased visibility,.

## 2022-11-06 NOTE — ED Notes (Signed)
Pt. Resting in bed, eyes closed, lights dimmed, chest rise and fall, NAD.

## 2022-11-06 NOTE — ED Notes (Signed)
Pt. Repositioned in bed by this RN and [place], PCT. Pt. Continues to mover her way down towards end of bed. This RN encourages pt. To rest and relax until transport arrives. Reassures pt. That she will be going home to the [facility] soon. Pt. Resting, eyes closed, chest rise and fall, states, "ok, if that's best."

## 2022-11-06 NOTE — Assessment & Plan Note (Signed)
Continue duloxetine and Wellbutrin

## 2022-11-06 NOTE — ED Notes (Signed)
This RN to bedside to check on pt. Pt. States she is wet, and has removed her pants. This RN notifies pt. The will get her cleaned up and repositioned. Sherilyn Cooter, RN to bedside to assist. Pt's linens, chucks, clothing changed. Dry gown and brief applied. Pt. Repositioned for comfort. Warm blanket provided. This RN noticed pt. Appears to be breathing harder, with some wheezing and tachypnea. Christiane Ha, PA notified. Transport arrived as pt. Being reassessed, transport declined due to pt's change in status. Continuous oxygen monitoring applied. 2L [place] applied.

## 2022-11-06 NOTE — ED Notes (Signed)
Pt to CT

## 2022-11-06 NOTE — ED Notes (Signed)
This RN discussed with PA, that pt. Is confused and prone to pulling at lines and monitoring. PA acknowledged and states he will order meds IM, and blood draw can be obtained with straight stick as opposed to IV access.

## 2022-11-06 NOTE — Assessment & Plan Note (Signed)
Frequent falls Patient slid out of her wheelchair and hit her head before she was caught CT head and C-spine nontraumatic Neurochecks Fall precautions

## 2022-11-06 NOTE — Assessment & Plan Note (Signed)
Slightly elevated troponin to 19 likely demand ischemia from wheezing

## 2022-11-06 NOTE — ED Notes (Signed)
Pt back from ct

## 2022-11-06 NOTE — Assessment & Plan Note (Signed)
Clinically euvolemic Continue spironolactone, losartan, furosemide and bisoprolol

## 2022-11-06 NOTE — ED Provider Notes (Addendum)
[facility] Provider Note  Patient Contact: 5:30 PM (approximate)   History   Fall   HPI  Samantha Carpenter is a 82 y.o. female who presents the emergency department from long-term care facility.  Patient was sent for evaluation of possible head injury.  Patient was in a wheelchair, slipped out and hit the back of her head.  No loss of consciousness.  Patient is roughly at her baseline.  Patient is demented, cannot provide any of her own history.  Facility is contacted and they advised that they just wanted imaging of her head to ensure no injury after falling out of her wheelchair.     Physical Exam   Triage Vital Signs: ED Triage Vitals  Encounter Vitals Group     BP 11/06/22 1331 138/63     Systolic BP Percentile --      Diastolic BP Percentile --      Pulse Rate 11/06/22 1331 (!) 58     Resp 11/06/22 1331 17     Temp 11/06/22 1331 98.2 F (36.8 C)     Temp Source 11/06/22 1331 Oral     SpO2 11/06/22 1331 96 %     Weight 11/06/22 1330 147 lb 11.3 oz (67 kg)     Height 11/06/22 1330 5\' 4"  (1.626 m)     Head Circumference --      Peak Flow --      Pain Score --      Pain Loc --      Pain Education --      Exclude from Growth Chart --     Most recent vital signs: Vitals:   11/06/22 2105 11/06/22 2115  BP: (!) 181/56   Pulse: 67 76  Resp: (!) 26 (!) 29  Temp:    SpO2: 94% 90%     General: Alert and in no acute distress. Eyes:  PERRL. EOMI. Head: No acute traumatic findings  Neck: No stridor. No cervical spine tenderness to palpation.  Cardiovascular:  Good peripheral perfusion Respiratory: Normal respiratory effort without tachypnea or retractions. Lungs CTAB.  Musculoskeletal: Full range of motion to all extremities.  Neurologic:  No gross focal neurologic deficits are appreciated.  Skin:   No rash noted Other:   ED Results / Procedures / Treatments   Labs (all labs ordered are listed, but only abnormal results are  displayed) Labs Reviewed  COMPREHENSIVE METABOLIC PANEL - Abnormal; Notable for the following components:      Result Value   Chloride 94 (*)    Glucose, Bld 158 (*)    Calcium 8.8 (*)    All other components within normal limits  CBC WITH DIFFERENTIAL/PLATELET - Abnormal; Notable for the following components:   Eosinophils Absolute 0.6 (*)    All other components within normal limits  TROPONIN I (HIGH SENSITIVITY) - Abnormal; Notable for the following components:   Troponin I (High Sensitivity) 21 (*)    All other components within normal limits  TROPONIN I (HIGH SENSITIVITY) - Abnormal; Notable for the following components:   Troponin I (High Sensitivity) 19 (*)    All other components within normal limits  SARS CORONAVIRUS 2 BY RT PCR  URINALYSIS, ROUTINE W REFLEX MICROSCOPIC     EKG     RADIOLOGY  I personally viewed, evaluated, and interpreted these images as part of my medical decision making, as well as reviewing the written report by the radiologist.  ED Provider Interpretation: No acute traumatic findings on CT  scan of the head.  CT CHEST WO CONTRAST  Result Date: 11/06/2022 CLINICAL DATA:  Abnormal xray - lung nodule, >= 1 cm nodular opacity on xray, short of breath EXAM: CT CHEST WITHOUT CONTRAST TECHNIQUE: Multidetector CT imaging of the chest was performed following the standard protocol without IV contrast. RADIATION DOSE REDUCTION: This exam was performed according to the departmental dose-optimization program which includes automated exposure control, adjustment of the mA and/or kV according to patient size and/or use of iterative reconstruction technique. COMPARISON:  11/09/2020 FINDINGS: Cardiovascular: Extensive multi-vessel coronary artery calcification. Global cardiac size within normal limits. No pericardial effusion. The central pulmonary arteries are of normal caliber. Extensive atherosclerotic calcification within the thoracic aorta. No aortic aneurysm.  Mediastinum/Nodes: No enlarged mediastinal or axillary lymph nodes. Thyroid gland, trachea, and esophagus demonstrate no significant findings. Lungs/Pleura: Moderate emphysema. There is asymmetric bronchial wall thickening and scattered airway impaction within the left lower lobe in keeping with an infectious or inflammatory bronchiolitis. The nodule noted on previously performed chest radiograph corresponds to multiple healed left anterior rib fractures with exuberant callus formation involving the left fourth rib. There are, however, development of several thin walled cavitary nodules within the left upper lobe at axial image # 54 and right upper lobe at axial image # 58, series 3 which are indeterminate, possibly the sequela of interval infection or inflammation. These measure 7 mm in mean diameter. No pneumothorax or pleural effusion. Upper Abdomen: No acute abnormality. Musculoskeletal: Multiple remote midthoracic compression deformities are noted. As noted above, multiple healed left anterior rib fractures involving ribs 4-8. No acute bone abnormality. No lytic or blastic bone lesion. IMPRESSION: 1. The nodule noted on previously performed chest radiograph corresponds to multiple healed left anterior rib fractures with exuberant callus formation involving the left fourth rib. 2. Development of several thin walled cavitary nodules within the left upper lobe and right upper lobe which are indeterminate, possibly the sequela of interval infection or inflammation. Non-contrast chest CT at 3-6 months is recommended. If the nodules are stable at time of repeat CT, then future CT at 18-24 months (from today's scan) is considered optional for low-risk patients, but is recommended for high-risk patients. This recommendation follows the consensus statement: Guidelines for Management of Incidental Pulmonary Nodules Detected on CT Images: From the Fleischner Society 2017; Radiology 2017; 284:228-243. 3. Asymmetric  bronchial wall thickening and scattered airway impaction within the left lower lobe in keeping with an infectious or inflammatory bronchiolitis. 4. Extensive multi-vessel coronary artery calcification. Aortic Atherosclerosis (ICD10-I70.0) and Emphysema (ICD10-J43.9). Electronically Signed   By: Helyn Numbers M.D.   On: 11/06/2022 22:54   DG Chest 2 View  Result Date: 11/06/2022 CLINICAL DATA:  Wheezing EXAM: CHEST - 2 VIEW COMPARISON:  Radiographs 10/29/2022 FINDINGS: Stable cardiomediastinal silhouette. Aortic atherosclerotic calcification. Hyperinflation and chronic bronchitic change. No focal consolidation, pleural effusion, or pneumothorax. 1.3 cm nodular opacity projecting over the left mid lung. No displaced rib fractures. Chronic compression of a upper thoracic vertebral body. IMPRESSION: No acute cardiopulmonary disease. 1.3 cm nodular opacity projecting over the left mid lung. CT may be helpful for further evaluation. Electronically Signed   By: Minerva Fester M.D.   On: 11/06/2022 20:55   CT HEAD WO CONTRAST ( )  Result Date: 11/06/2022 CLINICAL DATA:  Head trauma, minor. Additional history obtained from electronic MEDICAL RECORD NUMBERFall from wheelchair (hitting back of head). Subsequent lethargy. History of dementia. EXAM: CT HEAD WITHOUT CONTRAST TECHNIQUE: Contiguous axial images were obtained from the base of  the skull through the vertex without intravenous contrast. RADIATION DOSE REDUCTION: This exam was performed according to the departmental dose-optimization program which includes automated exposure control, adjustment of the mA and/or kV according to patient size and/or use of iterative reconstruction technique. COMPARISON:  Head CT 11/03/2022. FINDINGS: Brain: Generalized cerebral atrophy. Patchy and ill-defined hypoattenuation within the cerebral white matter, nonspecific but compatible with chronic small vessel ischemic disease. There is no acute intracranial hemorrhage. No  demarcated cortical infarct. No extra-axial fluid collection. No evidence of an intracranial mass. No midline shift. Vascular: No hyperdense vessel.  Atherosclerotic calcifications. Skull: No calvarial fracture or aggressive osseous lesion. Sinuses/Orbits: No mass or acute finding within the imaged orbits. No significant paranasal sinus disease. IMPRESSION: 1. No evidence of an acute intracranial abnormality. 2. Parenchymal atrophy and chronic small vessel ischemic disease. Electronically Signed   By: Jackey Loge D.O.   On: 11/06/2022 16:20    PROCEDURES:  Critical Care performed: No  Procedures   MEDICATIONS ORDERED IN ED: Medications  albuterol (PROVENTIL) (2.5 MG/3ML) 0.083% nebulizer solution 2.5 mg (2.5 mg Nebulization Given 11/06/22 2027)  methylPREDNISolone sodium succinate (SOLU-MEDROL) 125 mg/2 mL injection 125 mg (125 mg Intramuscular Given 11/06/22 2045)  albuterol (PROVENTIL) (2.5 MG/3ML) 0.083% nebulizer solution 2.5 mg (2.5 mg Nebulization Given 11/06/22 2135)     IMPRESSION / MDM / ASSESSMENT AND PLAN / ED COURSE  I reviewed the triage vital signs and the nursing notes.                                 Differential diagnosis includes, but is not limited to, skull fracture, intracranial hemorrhage, concussion   Patient's presentation is most consistent with acute presentation with potential threat to life or bodily function.   Patient's diagnosis is consistent with minor head injury.  Patient presents emergency department after slipping out of her wheelchair and hitting her head.  Patient was demented, unable to provide history.  Facility was contacted and they advised they wanted imaging of her head to ensure no traumatic findings.  Patient is moving all 4 extremities.  Does not seem to be bothered by palpation of the extremities.  There is no other acute findings and with negative head and imaging feel that patient is stable for discharge..  Patient may follow-up primary  care.  Facility is advised of return precautions.  Patient is given ED precautions to return to the ED for any worsening or new symptoms.  Addendum: Patient was for discharge, has been waiting for EMS transport.  As EMS arrived, nursing staff went into ready the patient and noticed that she had a breathing change with wheezing, increased respiratory rate.  She was still maintaining O2 saturation at 98% without oxygen though 2 L were placed given the increased respiratory effort.  Patient was wheezing on my assessment of the patient.  Strong history of emphysema, COPD.  This time patient will have labs, x-ray, EKG, steroids and breathing treatment.  Will reassess after treatments and labs have returned.  Patient continued to have wheezing despite albuterol and steroid treatment.  Given the change in status patient had labs, further imaging of the chest.  X-ray was concerning for possible opacity and CT was recommended.  CT without contrast was performed with evidence that this opacity was actually bony callus from her previous rib fracture.  There is findings concerning for bronchiolitic changes of inflammatory versus infectious component.  White blood cell  count is reassuring, no recent fevers, no cough.  It does appear that patient may not of had her daily medications including her daily inhalers.  I feel that likely with the increased activity that patient is likely experiencing COPD/emphysema exacerbation.  As patient will desaturate to 90% in the bed when oxygen is removed I feel that patient should be best benefited by admission for ongoing meds until her wheezing and oxygenation improved.  Will admit to the hospitalist team at this time.     FINAL CLINICAL IMPRESSION(S) / ED DIAGNOSES   Final diagnoses:  Minor head injury, initial encounter  COPD exacerbation (HCC)     Rx / DC Orders   ED Discharge Orders     None        Note:  This document was prepared using Dragon voice  recognition software and may include unintentional dictation errors.   Racheal Patches, PA-C 11/06/22 1740    Wilene Pharo, Renold Don 11/06/22 2335    Sharman Cheek, MD 11/11/22 1227

## 2022-11-06 NOTE — ED Notes (Signed)
[facility]  CALLED  FOR  TRANSPORT  TO  [facility] 

## 2022-11-06 NOTE — ED Triage Notes (Signed)
See first nurse note. Pt cannot state why she is here. Denies fall. No obvious injuries. Disoriented.

## 2022-11-06 NOTE — ED Notes (Signed)
[name], PCT at [facility] updated on pt's condition and that she will receive more testing and treatment. Dwaine Gale states that she will call pt's sister, and POA and update her.

## 2022-11-06 NOTE — H&P (Signed)
History and Physical    Patient: Samantha Carpenter ZOX:[id number] DOB: Mar 02, 1941 DOA: 11/06/2022 DOS: the patient was seen and examined on 11/06/2022 PCP: Pcp, No  Patient coming from: SNF  Chief Complaint:  Chief Complaint  Patient presents with   Fall    HPI: Samantha Carpenter is a 82 y.o. female with medical history significant for dCHF, hypertension, hyperlipidemia, COPD, dementia, depression with anxiety with multiple recent falls who was sent to the ED after she slid to the floor out of her wheelchair onto the floor and hit her head. She had no LOC. Patient had a negative trauma workup on the ED but then started wheezing in the ED and has continued to wheeze with sats in the low 90s requiring multiple rounds  of duonebs so observation requested.  Patient unable to contribute to history due to dementia. ED course and data reviewed: Tachypneic to 26-30 with O2 sats 90 to 92% on room air improving to 96 on O2.  Afebrile Labs: CBC WNL, CMP unremarkable, troponin 9, BNP not done.  COVID-negative. EKG, personally viewed and interpreted showing NSR at 73 with nonspecific ST-T wave changes. Chest x-ray was nonacute but showed a 1.3 cm nodular density Follow-up CT chest showed several thin-walled cavitary nodules within the left upper and right upper lobe of indeterminate nature with recommendation for noncontrast CT chest in 3 to 6 months (please see full report As mentioned above, patient treated with multiple rounds of DuoNebs and Solu-Medrol Hospitalist consulted for admission.    Past Medical History:  Diagnosis Date   Chronic kidney disease    COPD (chronic obstructive pulmonary disease) (HCC)    Dementia (HCC)    Depression    Hypertension    Liver disease    Past Surgical History:  Procedure Laterality Date   ABDOMINAL HYSTERECTOMY     CHOLECYSTECTOMY     ESOPHAGOGASTRODUODENOSCOPY N/A 11/20/2019   Procedure: ESOPHAGOGASTRODUODENOSCOPY (EGD);  Surgeon: Regis Bill, MD;   Location: [facility] ENDOSCOPY;  Service: Gastroenterology;  Laterality: N/A;   face tuck     Social History:  reports that she quit smoking about 13 years ago. She has never used smokeless tobacco. She reports that she does not currently use alcohol. She reports that she does not currently use drugs.  Allergies  Allergen Reactions   Morphine And Codeine    Latex Rash    "I break out"    Family History  Problem Relation Age of Onset   Bipolar disorder Brother    Schizophrenia Brother    Bipolar disorder Sister    Schizophrenia Sister    Drug abuse Son     Prior to Admission medications   Medication Sig Start Date End Date Taking? Authorizing Provider  Acetaminophen Extra Strength 500 MG TABS Take 1 tablet by mouth every 4 (four) hours as needed (pain). 10/31/21   [provider]  albuterol (VENTOLIN HFA) 108 (90 Base) MCG/ACT inhaler Inhale 2 puffs into the lungs every 6 (six) hours as needed for wheezing or shortness of breath. 05/05/20   [provider]  amLODipine (NORVASC) 10 MG tablet Take 1 tablet (10 mg total) by mouth daily. 11/17/21   Alford Highland, MD  aspirin EC 81 MG tablet Take 81 mg by mouth daily.    [provider]  bisoprolol (ZEBETA) 5 MG tablet Take 1 tablet (5 mg total) by mouth daily. 11/17/21   Alford Highland, MD  buPROPion (WELLBUTRIN XL) 150 MG 24 hr tablet Take 150 mg by mouth daily.  10/27/19   [provider]  clonazePAM (KLONOPIN) 1 MG tablet Take 1 tablet (1 mg total) by mouth 2 (two) times daily. 11/16/21   Alford Highland, MD  cloNIDine (CATAPRES) 0.1 MG tablet Take 0.1 mg by mouth 2 (two) times daily as needed.    [provider]  DULoxetine (CYMBALTA) 60 MG capsule Take 60 mg by mouth daily. 08/25/18   [provider]  famotidine (PEPCID) 20 MG tablet Take 1 tablet (20 mg total) by mouth daily. 08/07/22 08/07/23  Phineas Semen, MD  fluticasone-salmeterol (ADVAIR) 250-50 MCG/ACT AEPB Inhale 1 puff into the  lungs in the morning and at bedtime. Patient taking differently: Inhale 1 puff into the lungs in the morning and at bedtime. Morning and bedtime for COPD 04/03/22   Raechel Chute, MD  furosemide (LASIX) 40 MG tablet Take 1 tablet (40 mg total) by mouth daily. 04/24/22   Leeroy Bock, MD  gabapentin (NEURONTIN) 100 MG capsule Take 100 mg by mouth 2 (two) times daily. 06/18/18   [provider]  haloperidol (HALDOL) 2 MG tablet Take 1 tablet (2 mg total) by mouth every 6 (six) hours as needed for agitation. 11/16/21   Alford Highland, MD  ipratropium-albuterol (DUONEB) 0.5-2.5 (3) MG/3ML SOLN Take 3 mLs by nebulization in the morning, at noon, and at bedtime. 12/28/21   [provider]  losartan (COZAAR) 50 MG tablet Take 1.5 tablets by mouth daily. 10/02/22   [provider]  MAGNESIUM OXIDE 400 PO Take 1 tablet by mouth 2 (two) times daily as needed.    [provider]  OXcarbazepine (TRILEPTAL) 150 MG tablet Take 150-300 mg by mouth See admin instructions. Take 150 mg (1 tablet) by mouth every morning, and 300 mg (2 tablets) at bedtime. 06/12/18   [provider]  polyethylene glycol (MIRALAX / GLYCOLAX) 17 g packet Take 17 g by mouth daily. 10/25/21   [provider]  predniSONE (DELTASONE) 50 MG tablet Take once daily for 5 days Patient not taking: Reported on 10/14/2022 04/28/22   Georga Hacking, MD  QUEtiapine (SEROQUEL) 50 MG tablet Take 1 tablet (50 mg total) by mouth at bedtime. 02/19/22 10/14/22  Delfino Lovett, MD  rivastigmine (EXELON) 1.5 MG capsule Take 1.5 mg by mouth 2 (two) times daily. 03/28/20 10/14/22  [provider]  senna-docusate (SENNA PLUS) 8.6-50 MG tablet Take 1 tablet by mouth 2 (two) times daily.    [provider]  sodium chloride 1 g tablet Take 1 tablet (1 g total) by mouth 2 (two) times daily with a meal. 04/23/22   Leeroy Bock, MD  spironolactone (ALDACTONE) 25 MG tablet Take 1 tablet (25 mg  total) by mouth daily. 04/24/22   Leeroy Bock, MD  sucralfate (CARAFATE) 1 g tablet Take 1 tablet (1 g total) by mouth 4 (four) times daily. Patient not taking: Reported on 10/14/2022 08/07/22   Phineas Semen, MD  Tiotropium Bromide Monohydrate (SPIRIVA RESPIMAT) 2.5 MCG/ACT AERS Inhale 2 puffs into the lungs daily. 04/03/22   Raechel Chute, MD    Physical Exam: Vitals:   11/06/22 2030 11/06/22 2045 11/06/22 2105 11/06/22 2115  BP:   (!) 181/56   Pulse: 71 73 67 76  Resp:  (!) 30 (!) 26 (!) 29  Temp:      TempSrc:      SpO2: 97% 92% 94% 90%  Weight:      Height:       Physical Exam Vitals and nursing note  reviewed.  Constitutional:      General: She is not in acute distress.    Comments: Patient in mittens, sitter at bedside  HENT:     Head: Normocephalic and atraumatic.  Cardiovascular:     Rate and Rhythm: Normal rate and regular rhythm.     Heart sounds: Normal heart sounds.  Pulmonary:     Effort: Tachypnea present.     Breath sounds: Normal breath sounds.  Abdominal:     Palpations: Abdomen is soft.     Tenderness: There is no abdominal tenderness.  Neurological:     Mental Status: She is disoriented.  Psychiatric:        Behavior: Behavior is uncooperative.     Labs on Admission: I have personally reviewed following labs and imaging studies  CBC: Recent Labs  Lab 10/31/22 1606 11/06/22 2045  WBC 6.9 8.6  NEUTROABS  --  6.3  HGB 12.8 12.0  HCT 39.3 36.2  MCV 94.5 93.3  PLT 210 223   Basic Metabolic Panel: Recent Labs  Lab 10/31/22 1606 11/06/22 2045  NA 138 136  K 3.1* 3.5  CL 99 94*  CO2 30 31  GLUCOSE 114* 158*  BUN 16 14  CREATININE 0.90 0.82  CALCIUM 8.9 8.8*   GFR: Estimated Creatinine Clearance: 48.9 mL/min (by C-G formula based on SCr of 0.82 mg/dL). Liver Function Tests: Recent Labs  Lab 11/06/22 2045  AST 20  ALT 17  ALKPHOS 86  BILITOT 0.7  PROT 6.9  ALBUMIN 3.6   No results for input(s): "LIPASE", "AMYLASE" in  the last 168 hours. No results for input(s): "AMMONIA" in the last 168 hours. Coagulation Profile: No results for input(s): "INR", "PROTIME" in the last 168 hours. Cardiac Enzymes: No results for input(s): "CKTOTAL", "CKMB", "CKMBINDEX", "TROPONINI" in the last 168 hours. BNP (last 3 results) No results for input(s): "PROBNP" in the last 8760 hours. HbA1C: No results for input(s): "HGBA1C" in the last 72 hours. CBG: No results for input(s): "GLUCAP" in the last 168 hours. Lipid Profile: No results for input(s): "CHOL", "HDL", "LDLCALC", "TRIG", "CHOLHDL", "LDLDIRECT" in the last 72 hours. Thyroid Function Tests: No results for input(s): "TSH", "T4TOTAL", "FREET4", "T3FREE", "THYROIDAB" in the last 72 hours. Anemia Panel: No results for input(s): "VITAMINB12", "FOLATE", "FERRITIN", "TIBC", "IRON", "RETICCTPCT" in the last 72 hours. Urine analysis:    Component Value Date/Time   COLORURINE YELLOW (A) 10/29/2022 1720   APPEARANCEUR CLEAR (A) 10/29/2022 1720   LABSPEC 1.013 10/29/2022 1720   PHURINE 6.0 10/29/2022 1720   GLUCOSEU NEGATIVE 10/29/2022 1720   HGBUR NEGATIVE 10/29/2022 1720   BILIRUBINUR NEGATIVE 10/29/2022 1720   KETONESUR NEGATIVE 10/29/2022 1720   PROTEINUR NEGATIVE 10/29/2022 1720   NITRITE NEGATIVE 10/29/2022 1720   LEUKOCYTESUR TRACE (A) 10/29/2022 1720    Radiological Exams on Admission: CT CHEST WO CONTRAST  Result Date: 11/06/2022 CLINICAL DATA:  Abnormal xray - lung nodule, >= 1 cm nodular opacity on xray, short of breath EXAM: CT CHEST WITHOUT CONTRAST TECHNIQUE: Multidetector CT imaging of the chest was performed following the standard protocol without IV contrast. RADIATION DOSE REDUCTION: This exam was performed according to the departmental dose-optimization program which includes automated exposure control, adjustment of the mA and/or kV according to patient size and/or use of iterative reconstruction technique. COMPARISON:  11/09/2020 FINDINGS:  Cardiovascular: Extensive multi-vessel coronary artery calcification. Global cardiac size within normal limits. No pericardial effusion. The central pulmonary arteries are of normal caliber. Extensive atherosclerotic calcification within the thoracic aorta.  No aortic aneurysm. Mediastinum/Nodes: No enlarged mediastinal or axillary lymph nodes. Thyroid gland, trachea, and esophagus demonstrate no significant findings. Lungs/Pleura: Moderate emphysema. There is asymmetric bronchial wall thickening and scattered airway impaction within the left lower lobe in keeping with an infectious or inflammatory bronchiolitis. The nodule noted on previously performed chest radiograph corresponds to multiple healed left anterior rib fractures with exuberant callus formation involving the left fourth rib. There are, however, development of several thin walled cavitary nodules within the left upper lobe at axial image # 54 and right upper lobe at axial image # 58, series 3 which are indeterminate, possibly the sequela of interval infection or inflammation. These measure 7 mm in mean diameter. No pneumothorax or pleural effusion. Upper Abdomen: No acute abnormality. Musculoskeletal: Multiple remote midthoracic compression deformities are noted. As noted above, multiple healed left anterior rib fractures involving ribs 4-8. No acute bone abnormality. No lytic or blastic bone lesion. IMPRESSION: 1. The nodule noted on previously performed chest radiograph corresponds to multiple healed left anterior rib fractures with exuberant callus formation involving the left fourth rib. 2. Development of several thin walled cavitary nodules within the left upper lobe and right upper lobe which are indeterminate, possibly the sequela of interval infection or inflammation. Non-contrast chest CT at 3-6 months is recommended. If the nodules are stable at time of repeat CT, then future CT at 18-24 months (from today's scan) is considered optional for  low-risk patients, but is recommended for high-risk patients. This recommendation follows the consensus statement: Guidelines for Management of Incidental Pulmonary Nodules Detected on CT Images: From the Fleischner Society 2017; Radiology 2017; 284:228-243. 3. Asymmetric bronchial wall thickening and scattered airway impaction within the left lower lobe in keeping with an infectious or inflammatory bronchiolitis. 4. Extensive multi-vessel coronary artery calcification. Aortic Atherosclerosis (ICD10-I70.0) and Emphysema (ICD10-J43.9). Electronically Signed   By: Helyn Numbers M.D.   On: 11/06/2022 22:54   DG Chest 2 View  Result Date: 11/06/2022 CLINICAL DATA:  Wheezing EXAM: CHEST - 2 VIEW COMPARISON:  Radiographs 10/29/2022 FINDINGS: Stable cardiomediastinal silhouette. Aortic atherosclerotic calcification. Hyperinflation and chronic bronchitic change. No focal consolidation, pleural effusion, or pneumothorax. 1.3 cm nodular opacity projecting over the left mid lung. No displaced rib fractures. Chronic compression of a upper thoracic vertebral body. IMPRESSION: No acute cardiopulmonary disease. 1.3 cm nodular opacity projecting over the left mid lung. CT may be helpful for further evaluation. Electronically Signed   By: Minerva Fester M.D.   On: 11/06/2022 20:55   CT HEAD WO CONTRAST ( )  Result Date: 11/06/2022 CLINICAL DATA:  Head trauma, minor. Additional history obtained from electronic MEDICAL RECORD NUMBERFall from wheelchair (hitting back of head). Subsequent lethargy. History of dementia. EXAM: CT HEAD WITHOUT CONTRAST TECHNIQUE: Contiguous axial images were obtained from the base of the skull through the vertex without intravenous contrast. RADIATION DOSE REDUCTION: This exam was performed according to the departmental dose-optimization program which includes automated exposure control, adjustment of the mA and/or kV according to patient size and/or use of iterative reconstruction technique.  COMPARISON:  Head CT 11/03/2022. FINDINGS: Brain: Generalized cerebral atrophy. Patchy and ill-defined hypoattenuation within the cerebral white matter, nonspecific but compatible with chronic small vessel ischemic disease. There is no acute intracranial hemorrhage. No demarcated cortical infarct. No extra-axial fluid collection. No evidence of an intracranial mass. No midline shift. Vascular: No hyperdense vessel.  Atherosclerotic calcifications. Skull: No calvarial fracture or aggressive osseous lesion. Sinuses/Orbits: No mass or acute finding within the imaged orbits. No significant  paranasal sinus disease. IMPRESSION: 1. No evidence of an acute intracranial abnormality. 2. Parenchymal atrophy and chronic small vessel ischemic disease. Electronically Signed   By: Jackey Loge D.O.   On: 11/06/2022 16:20     Data Reviewed: Relevant notes from primary care and specialist visits, past discharge summaries as available in EHR, including Care Everywhere. Prior diagnostic testing as pertinent to current admission diagnoses Updated medications and problem lists for reconciliation ED course, including vitals, labs, imaging, treatment and response to treatment Triage notes, nursing and pharmacy notes and ED provider's notes Notable results as noted in HPI   Assessment and Plan: * COPD with acute exacerbation (HCC) Mild hypoxia Patient with known COPD not on oxygen but has been on BiPAP with past exacerbations Scheduled and as needed nebulized bronchodilators IV steroids Supplemental oxygen to keep sats over 92%  Fall at home, initial encounter Frequent falls Patient slid out of her wheelchair and hit her head before she was caught CT head and C-spine nontraumatic Neurochecks Fall precautions  HTN (hypertension) BP slightly elevated Continue home meds of losartan, bisoprolol, Catapres and amlodipine  Elevated troponin Slightly elevated troponin to 19 likely demand ischemia from  wheezing  Chronic diastolic CHF (congestive heart failure) (HCC) Clinically euvolemic Continue spironolactone, losartan, furosemide and bisoprolol  Dementia with behavioral disturbance (HCC) Patient reportedly restless getting out of bed the ED Continue quetiapine, rivastigmine Delirium precautions Blinds open during the day, closed at night, frequent reorientation, minimize nighttime interruptions When possible, minimize use of narcotics, benzodiazepines, anti cholingeneric, and antihistamine If delirium becomes problematic,Haldol 0.5-1 mg q6/prn.  Geodon , if needed - 10 mg q8 PRN severe agitation    Depression with anxiety Continue duloxetine and Wellbutrin       DVT prophylaxis: Lovenox  Consults: none  Advance Care Planning:   Code Status: Prior   Family Communication: none  Disposition Plan: Back to previous home environment  Severity of Illness: The appropriate patient status for this patient is OBSERVATION. Observation status is judged to be reasonable and necessary in order to provide the required intensity of service to ensure the patient's safety. The patient's presenting symptoms, physical exam findings, and initial radiographic and laboratory data in the context of their medical condition is felt to place them at decreased risk for further clinical deterioration. Furthermore, it is anticipated that the patient will be medically stable for discharge from the hospital within 2 midnights of admission.   Author: Andris Baumann, MD 11/06/2022 11:36 PM  For on call review [email or web address].

## 2022-11-07 DIAGNOSIS — J441 Chronic obstructive pulmonary disease with (acute) exacerbation: Secondary | ICD-10-CM

## 2022-11-07 MED ORDER — LORAZEPAM 2 MG/ML IJ SOLN: 0.5000 mg | Freq: Three times a day (TID) | INTRAMUSCULAR | Status: DC | PRN

## 2022-11-07 MED ORDER — HALOPERIDOL LACTATE 5 MG/ML IJ SOLN: 2.0000 mg | INTRAMUSCULAR | Status: DC | PRN

## 2022-11-07 MED ORDER — QUETIAPINE FUMARATE 25 MG PO TABS
50.0000 mg | ORAL_TABLET | Freq: Every day | ORAL | Status: DC
  Administered 2022-11-07: 50 mg via ORAL
  Filled 2022-11-07: qty 2

## 2022-11-07 NOTE — Hospital Course (Addendum)
Samantha Carpenter is a 82 y.o. female with medical history significant for dCHF, hypertension, hyperlipidemia, COPD, dementia, depression with anxiety with multiple recent falls who was sent to the ED via [facility] from the [facility] [place] after she slid to the floor out of her wheelchair onto the floor and hit her head. She had no LOC.  08/20: Patient had a negative trauma workup on the ED but then started wheezing in the ED and has continued to wheeze with sats in the low 90s requiring multiple rounds duonebs. CT chest showed several thin-walled cavitary nodules within the left upper and right upper lobe of indeterminate nature with recommendation for noncontrast CT chest in 3 to 6 months. Hospitalist consulted for admission/observation.  Patient unable to contribute to history due to dementia.  08/21: 93% on 2L O2. Pt is confused, disoriented, grabbing at her O2 cannula, sundowning. Suspect needs memory care, unsafe d/c back to assisted living at this time      Consultants:  none  Procedures: none      ASSESSMENT & PLAN:   Principal Problem:   COPD with acute exacerbation (HCC) Active Problems:   Fall at home, initial encounter   Elevated troponin   HTN (hypertension)   Dementia with behavioral disturbance (HCC)   Chronic diastolic CHF (congestive heart failure) (HCC)   Depression with anxiety   COPD with acute exacerbation (HCC) Mild hypoxia not on oxygen at baseline has been on BiPAP with past exacerbations Scheduled and as needed nebulized bronchodilators IV steroids Supplemental oxygen to keep sats over 92%   Fall at home (SNF long-term), initial encounter Frequent falls Patient slid out of her wheelchair and hit her head before she was caught CT head and C-spine nontraumatic Fall precautions PT/OT   HTN (hypertension) BP slightly elevated on admission  Continue home meds of losartan, bisoprolol, Catapres and amlodipine   Elevated troponin Slightly elevated troponin  to 19  likely demand ischemia from wheezing Trended down Repeat if chest pain    Chronic diastolic CHF (congestive heart failure) (HCC) Clinically euvolemic Continue spironolactone, losartan, furosemide and bisoprolol   Dementia with behavioral disturbance (HCC) Patient reportedly restless getting out of bed the ED Continue quetiapine, rivastigmine Delirium precautions Blinds open during the day, closed at night, frequent reorientation, minimize nighttime interruptions When possible, minimize use of narcotics, benzodiazepines, anti cholingeneric, and antihistamine If delirium becomes problematic,Haldol 0.5-1 mg q6/prn.  Geodon , if needed - 10 mg q8 PRN severe agitation    Depression with anxiety Continue duloxetine and Wellbutrin      DVT prophylaxis: lovenox  Pertinent IV fluids/nutrition: no continuous IV fluids  Central lines / invasive devices: none  Code Status: DNR ACP documentation reviewed: DNR on file   Current Admission Status: observation  TOC needs / Dispo plan: may need placement memory care, assist as able Barriers to discharge / significant pending items: unsafe dispo back to assisted living, high risk readmisison

## 2022-11-07 NOTE — Plan of Care (Signed)
  Problem: Education: Goal: Knowledge of disease or condition will improve Outcome: Progressing   Problem: Activity: Goal: Ability to tolerate increased activity will improve Outcome: Progressing   Problem: Respiratory: Goal: Ability to maintain a clear airway will improve Outcome: Progressing   Problem: Education: Goal: Knowledge of General Education information will improve Description: Including pain rating scale, medication(s)/side effects and non-pharmacologic comfort measures Outcome: Progressing   Problem: Health Behavior/Discharge Planning: Goal: Ability to manage health-related needs will improve Outcome: Progressing   Problem: Coping: Goal: Level of anxiety will decrease Outcome: Progressing   Problem: Elimination: Goal: Will not experience complications related to bowel motility Outcome: Progressing

## 2022-11-07 NOTE — Progress Notes (Signed)
PROGRESS NOTE    Samantha Carpenter   WNU:[id number] DOB: 1940-07-17  DOA: 11/06/2022 Date of Service: 11/07/22 PCP: Oneita Hurt, No     Brief Narrative / Hospital Course:  Samantha Carpenter is a 82 y.o. female with medical history significant for dCHF, hypertension, hyperlipidemia, COPD, dementia, depression with anxiety with multiple recent falls who was sent to the ED via [facility] from the [facility] [place] after she slid to the floor out of her wheelchair onto the floor and hit her head. She had no LOC.  08/20: Patient had a negative trauma workup on the ED but then started wheezing in the ED and has continued to wheeze with sats in the low 90s requiring multiple rounds duonebs. CT chest showed several thin-walled cavitary nodules within the left upper and right upper lobe of indeterminate nature with recommendation for noncontrast CT chest in 3 to 6 months. Hospitalist consulted for admission/observation.  Patient unable to contribute to history due to dementia.  08/21: 93% on 2L O2. Pt is confused, disoriented, grabbing at her O2 cannula, sundowning. Suspect needs memory care, unsafe d/c back to assisted living at this time      Consultants:  none  Procedures: none      ASSESSMENT & PLAN:   Principal Problem:   COPD with acute exacerbation (HCC) Active Problems:   Fall at home, initial encounter   Elevated troponin   HTN (hypertension)   Dementia with behavioral disturbance (HCC)   Chronic diastolic CHF (congestive heart failure) (HCC)   Depression with anxiety   COPD with acute exacerbation (HCC) Mild hypoxia not on oxygen at baseline has been on BiPAP with past exacerbations Scheduled and as needed nebulized bronchodilators IV steroids Supplemental oxygen to keep sats over 92%   Fall at home (SNF long-term), initial encounter Frequent falls Patient slid out of her wheelchair and hit her head before she was caught CT head and C-spine nontraumatic Fall precautions PT/OT    HTN (hypertension) BP slightly elevated on admission  Continue home meds of losartan, bisoprolol, Catapres and amlodipine   Elevated troponin Slightly elevated troponin to 19  likely demand ischemia from wheezing Trended down Repeat if chest pain    Chronic diastolic CHF (congestive heart failure) (HCC) Clinically euvolemic Continue spironolactone, losartan, furosemide and bisoprolol   Dementia with behavioral disturbance (HCC) Patient reportedly restless getting out of bed the ED Continue quetiapine, rivastigmine Delirium precautions Blinds open during the day, closed at night, frequent reorientation, minimize nighttime interruptions When possible, minimize use of narcotics, benzodiazepines, anti cholingeneric, and antihistamine If delirium becomes problematic,Haldol 0.5-1 mg q6/prn.  Geodon , if needed - 10 mg q8 PRN severe agitation    Depression with anxiety Continue duloxetine and Wellbutrin      DVT prophylaxis: lovenox  Pertinent IV fluids/nutrition: no continuous IV fluids  Central lines / invasive devices: none  Code Status: DNR ACP documentation reviewed: DNR on file   Current Admission Status: observation  TOC needs / Dispo plan: may need placement memory care, assist as able Barriers to discharge / significant pending items: unsafe dispo back to assisted living, high risk readmisison              Subjective / Brief ROS:  Patient reports no concerns but is confused  Denies CP/SOB.    Family Communication: spoke on phone w/ sister who confirms patient's baseline     Objective Findings:  Vitals:   11/07/22 0721 11/07/22 0800 11/07/22 1212 11/07/22 1614  BP: (!) 146/87 (!) 117/44 Marland Kitchen)  132/57 (!) 158/66  Pulse: (!) 102 (!) 57 72 76  Resp: 17 17 17 16   Temp: 98.5 F (36.9 C) 97.8 F (36.6 C) 98.3 F (36.8 C) 99.2 F (37.3 C)  TempSrc: Oral     SpO2: 97%   93%  Weight:      Height:       No intake or output data in the 24 hours ending  11/07/22 1730 Filed Weights   11/06/22 1330  Weight: 67 kg    Examination:  Physical Exam Constitutional:      General: She is not in acute distress.    Appearance: Normal appearance.  Cardiovascular:     Rate and Rhythm: Normal rate and regular rhythm.  Pulmonary:     Effort: Pulmonary effort is normal.     Breath sounds: Normal breath sounds.  Abdominal:     Palpations: Abdomen is soft.  Neurological:     Mental Status: Mental status is at baseline. She is disoriented.          Scheduled Medications:   amLODipine  10 mg Oral Daily   aspirin EC  81 mg Oral Daily   bisoprolol  5 mg Oral Daily   DULoxetine  60 mg Oral Daily   enoxaparin (LOVENOX) injection  40 mg Subcutaneous Q24H   famotidine  20 mg Oral Daily   furosemide  40 mg Oral Daily   ipratropium-albuterol  3 mL Nebulization Q6H   losartan  100 mg Oral Daily   methylPREDNISolone (SOLU-MEDROL) injection  40 mg Intravenous Q12H   Followed by   Melene Muller ON 11/08/2022] predniSONE  40 mg Oral Q breakfast   QUEtiapine  50 mg Oral QHS   rivastigmine  1.5 mg Oral BID   spironolactone  25 mg Oral Daily    Continuous Infusions:   PRN Medications:  acetaminophen **OR** acetaminophen, albuterol, LORazepam, ondansetron **OR** ondansetron (ZOFRAN) IV  Antimicrobials from admission:  Anti-infectives (From admission, onward)    None           Data Reviewed:  I have personally reviewed the following...  CBC: Recent Labs  Lab 11/06/22 2045  WBC 8.6  NEUTROABS 6.3  HGB 12.0  HCT 36.2  MCV 93.3  PLT 223   Basic Metabolic Panel: Recent Labs  Lab 11/06/22 2045  NA 136  K 3.5  CL 94*  CO2 31  GLUCOSE 158*  BUN 14  CREATININE 0.82  CALCIUM 8.8*   GFR: Estimated Creatinine Clearance: 48.9 mL/min (by C-G formula based on SCr of 0.82 mg/dL). Liver Function Tests: Recent Labs  Lab 11/06/22 2045  AST 20  ALT 17  ALKPHOS 86  BILITOT 0.7  PROT 6.9  ALBUMIN 3.6   No results for input(s):  "LIPASE", "AMYLASE" in the last 168 hours. No results for input(s): "AMMONIA" in the last 168 hours. Coagulation Profile: No results for input(s): "INR", "PROTIME" in the last 168 hours. Cardiac Enzymes: No results for input(s): "CKTOTAL", "CKMB", "CKMBINDEX", "TROPONINI" in the last 168 hours. BNP (last 3 results) No results for input(s): "PROBNP" in the last 8760 hours. HbA1C: No results for input(s): "HGBA1C" in the last 72 hours. CBG: No results for input(s): "GLUCAP" in the last 168 hours. Lipid Profile: No results for input(s): "CHOL", "HDL", "LDLCALC", "TRIG", "CHOLHDL", "LDLDIRECT" in the last 72 hours. Thyroid Function Tests: No results for input(s): "TSH", "T4TOTAL", "FREET4", "T3FREE", "THYROIDAB" in the last 72 hours. Anemia Panel: No results for input(s): "VITAMINB12", "FOLATE", "FERRITIN", "TIBC", "IRON", "RETICCTPCT" in the last  72 hours. Most Recent Urinalysis On File:     Component Value Date/Time   COLORURINE YELLOW (A) 10/29/2022 1720   APPEARANCEUR CLEAR (A) 10/29/2022 1720   LABSPEC 1.013 10/29/2022 1720   PHURINE 6.0 10/29/2022 1720   GLUCOSEU NEGATIVE 10/29/2022 1720   HGBUR NEGATIVE 10/29/2022 1720   BILIRUBINUR NEGATIVE 10/29/2022 1720   KETONESUR NEGATIVE 10/29/2022 1720   PROTEINUR NEGATIVE 10/29/2022 1720   NITRITE NEGATIVE 10/29/2022 1720   LEUKOCYTESUR TRACE (A) 10/29/2022 1720   Sepsis Labs: @LABRCNTIP (procalcitonin:4,lacticidven:4) Microbiology: Recent Results (from the past 240 hour(s))  SARS Coronavirus 2 by RT PCR (hospital order, performed in [facility] hospital lab) *cepheid single result test* Anterior Nasal Swab     Status: None   Collection Time: 10/29/22  3:32 PM   Specimen: Anterior Nasal Swab  Result Value Ref Range Status   SARS Coronavirus 2 by RT PCR NEGATIVE NEGATIVE Final    Comment: (NOTE) SARS-CoV-2 target nucleic acids are NOT DETECTED.  The SARS-CoV-2 RNA is generally detectable in upper and lower respiratory specimens  during the acute phase of infection. The lowest concentration of SARS-CoV-2 viral copies this assay can detect is 250 copies / mL. A negative result does not preclude SARS-CoV-2 infection and should not be used as the sole basis for treatment or other patient management decisions.  A negative result may occur with improper specimen collection / handling, submission of specimen other than nasopharyngeal swab, presence of viral mutation(s) within the areas targeted by this assay, and inadequate number of viral copies (<250 copies / mL). A negative result must be combined with clinical observations, patient history, and epidemiological information.  Fact Sheet for Patients:   RoadLapTop.co.za  Fact Sheet for Healthcare Providers: http://kim-miller.com/  This test is not yet approved or  cleared by the [place] FDA and has been authorized for detection and/or diagnosis of SARS-CoV-2 by FDA under an Emergency Use Authorization (EUA).  This EUA will remain in effect (meaning this test can be used) for the duration of the COVID-19 declaration under Section 564(b)(1) of the Act, 21 U.S.C. section 360bbb-3(b)(1), unless the authorization is terminated or revoked sooner.  Performed at [facility], [address] [place], [place] [address]   SARS Coronavirus 2 by RT PCR (hospital order, performed in [facility] hospital lab) *cepheid single result test* Anterior Nasal Swab     Status: None   Collection Time: 11/06/22  9:35 PM   Specimen: Anterior Nasal Swab  Result Value Ref Range Status   SARS Coronavirus 2 by RT PCR NEGATIVE NEGATIVE Final    Comment: (NOTE) SARS-CoV-2 target nucleic acids are NOT DETECTED.  The SARS-CoV-2 RNA is generally detectable in upper and lower respiratory specimens during the acute phase of infection. The lowest concentration of SARS-CoV-2 viral copies this assay can detect is 250 copies / mL. A  negative result does not preclude SARS-CoV-2 infection and should not be used as the sole basis for treatment or other patient management decisions.  A negative result may occur with improper specimen collection / handling, submission of specimen other than nasopharyngeal swab, presence of viral mutation(s) within the areas targeted by this assay, and inadequate number of viral copies (<250 copies / mL). A negative result must be combined with clinical observations, patient history, and epidemiological information.  Fact Sheet for Patients:   RoadLapTop.co.za  Fact Sheet for Healthcare Providers: http://kim-miller.com/  This test is not yet approved or  cleared by the [place] and has been authorized  for detection and/or diagnosis of SARS-CoV-2 by FDA under an Emergency Use Authorization (EUA).  This EUA will remain in effect (meaning this test can be used) for the duration of the COVID-19 declaration under Section 564(b)(1) of the Act, 21 U.S.C. section 360bbb-3(b)(1), unless the authorization is terminated or revoked sooner.  Performed at [facility], [address], [place], [place] [address]       Radiology Studies last 3 days: CT CHEST WO CONTRAST  Result Date: 11/06/2022 CLINICAL DATA:  Abnormal xray - lung nodule, >= 1 cm nodular opacity on xray, short of breath EXAM: CT CHEST WITHOUT CONTRAST TECHNIQUE: Multidetector CT imaging of the chest was performed following the standard protocol without IV contrast. RADIATION DOSE REDUCTION: This exam was performed according to the departmental dose-optimization program which includes automated exposure control, adjustment of the mA and/or kV according to patient size and/or use of iterative reconstruction technique. COMPARISON:  11/09/2020 FINDINGS: Cardiovascular: Extensive multi-vessel coronary artery calcification. Global cardiac size within normal limits. No  pericardial effusion. The central pulmonary arteries are of normal caliber. Extensive atherosclerotic calcification within the thoracic aorta. No aortic aneurysm. Mediastinum/Nodes: No enlarged mediastinal or axillary lymph nodes. Thyroid gland, trachea, and esophagus demonstrate no significant findings. Lungs/Pleura: Moderate emphysema. There is asymmetric bronchial wall thickening and scattered airway impaction within the left lower lobe in keeping with an infectious or inflammatory bronchiolitis. The nodule noted on previously performed chest radiograph corresponds to multiple healed left anterior rib fractures with exuberant callus formation involving the left fourth rib. There are, however, development of several thin walled cavitary nodules within the left upper lobe at axial image # 54 and right upper lobe at axial image # 58, series 3 which are indeterminate, possibly the sequela of interval infection or inflammation. These measure 7 mm in mean diameter. No pneumothorax or pleural effusion. Upper Abdomen: No acute abnormality. Musculoskeletal: Multiple remote midthoracic compression deformities are noted. As noted above, multiple healed left anterior rib fractures involving ribs 4-8. No acute bone abnormality. No lytic or blastic bone lesion. IMPRESSION: 1. The nodule noted on previously performed chest radiograph corresponds to multiple healed left anterior rib fractures with exuberant callus formation involving the left fourth rib. 2. Development of several thin walled cavitary nodules within the left upper lobe and right upper lobe which are indeterminate, possibly the sequela of interval infection or inflammation. Non-contrast chest CT at 3-6 months is recommended. If the nodules are stable at time of repeat CT, then future CT at 18-24 months (from today's scan) is considered optional for low-risk patients, but is recommended for high-risk patients. This recommendation follows the consensus statement:  Guidelines for Management of Incidental Pulmonary Nodules Detected on CT Images: From the Fleischner Society 2017; Radiology 2017; 284:228-243. 3. Asymmetric bronchial wall thickening and scattered airway impaction within the left lower lobe in keeping with an infectious or inflammatory bronchiolitis. 4. Extensive multi-vessel coronary artery calcification. Aortic Atherosclerosis (ICD10-I70.0) and Emphysema (ICD10-J43.9). Electronically Signed   By: Helyn Numbers M.D.   On: 11/06/2022 22:54   DG Chest 2 View  Result Date: 11/06/2022 CLINICAL DATA:  Wheezing EXAM: CHEST - 2 VIEW COMPARISON:  Radiographs 10/29/2022 FINDINGS: Stable cardiomediastinal silhouette. Aortic atherosclerotic calcification. Hyperinflation and chronic bronchitic change. No focal consolidation, pleural effusion, or pneumothorax. 1.3 cm nodular opacity projecting over the left mid lung. No displaced rib fractures. Chronic compression of a upper thoracic vertebral body. IMPRESSION: No acute cardiopulmonary disease. 1.3 cm nodular opacity projecting over the left mid lung. CT  may be helpful for further evaluation. Electronically Signed   By: Minerva Fester M.D.   On: 11/06/2022 20:55   CT HEAD WO CONTRAST ( )  Result Date: 11/06/2022 CLINICAL DATA:  Head trauma, minor. Additional history obtained from electronic MEDICAL RECORD NUMBERFall from wheelchair (hitting back of head). Subsequent lethargy. History of dementia. EXAM: CT HEAD WITHOUT CONTRAST TECHNIQUE: Contiguous axial images were obtained from the base of the skull through the vertex without intravenous contrast. RADIATION DOSE REDUCTION: This exam was performed according to the departmental dose-optimization program which includes automated exposure control, adjustment of the mA and/or kV according to patient size and/or use of iterative reconstruction technique. COMPARISON:  Head CT 11/03/2022. FINDINGS: Brain: Generalized cerebral atrophy. Patchy and ill-defined hypoattenuation  within the cerebral white matter, nonspecific but compatible with chronic small vessel ischemic disease. There is no acute intracranial hemorrhage. No demarcated cortical infarct. No extra-axial fluid collection. No evidence of an intracranial mass. No midline shift. Vascular: No hyperdense vessel.  Atherosclerotic calcifications. Skull: No calvarial fracture or aggressive osseous lesion. Sinuses/Orbits: No mass or acute finding within the imaged orbits. No significant paranasal sinus disease. IMPRESSION: 1. No evidence of an acute intracranial abnormality. 2. Parenchymal atrophy and chronic small vessel ischemic disease. Electronically Signed   By: Jackey Loge D.O.   On: 11/06/2022 16:20             LOS: 0 days       Sunnie Nielsen, DO [facility] 11/07/2022, 5:30 PM    Dictation software may have been used to generate the above note. Typos may occur and escape review in typed/dictated notes. Please contact Dr Lyn Hollingshead directly for clarity if needed.  Staff may message me via secure chat in Epic  but this may not receive an immediate response,  please page me for urgent matters!  If 7PM-7AM, please contact night coverage [email or web address]

## 2022-11-07 NOTE — TOC Progression Note (Signed)
Transition of Care [facility]) - Progression Note    Patient Details  Name: Samantha Carpenter MRN: [id number] Date of Birth: May 20, 1940  Transition of Care [facility] CM/SW Contact  Garret Reddish, RN Phone Number: 11/07/2022, 11:38 AM  Clinical Narrative:   Chart reviewed.  Noted that patient is a resident at [facility] of [address]  I have left a voice message for their resident coordinator to return my call.  Patient was admitted with COPD with Acute exacerbation.  Patient receiving IV steroids and breathing treatments as needed.  Monitoring for o2 needs.    TOC will continue to follow for discharge planning.          Expected Discharge Plan and Services                                               Social Determinants of Health (SDOH) Interventions SDOH Screenings   Food Insecurity: No Food Insecurity (11/07/2022)  Housing: Low Risk  (11/07/2022)  Transportation Needs: No Transportation Needs (11/07/2022)  Utilities: Not At Risk (11/07/2022)  Financial Resource Strain: Low Risk  (09/02/2018)  Physical Activity: Sufficiently Active (09/02/2018)  Social Connections: Unknown (09/02/2018)  Stress: No Stress Concern Present (09/02/2018)  Tobacco Use: Medium Risk (10/29/2022)    Readmission Risk Interventions    01/11/2022    2:05 PM 01/09/2022    2:23 PM  Readmission Risk Prevention Plan  Transportation Screening  Complete  HRI or Home Care Consult Complete   Palliative Care Screening  Not Applicable  Medication Review (RN Care Manager)  Complete

## 2022-11-07 NOTE — ED Notes (Addendum)
Pt pulled oxygen [place] off, as well as ear pulse ox. Dr Para March ordered Im haldol, meds given.  Pt in hallway bed .

## 2022-11-08 DIAGNOSIS — J441 Chronic obstructive pulmonary disease with (acute) exacerbation: Secondary | ICD-10-CM | POA: Diagnosis not present

## 2022-11-08 MED ORDER — SPIRONOLACTONE 25 MG PO TABS: 12.5000 mg | ORAL_TABLET | Freq: Every day | ORAL | Status: DC

## 2022-11-08 MED ORDER — LOSARTAN POTASSIUM 100 MG PO TABS: 50.0000 mg | ORAL_TABLET | Freq: Every day | ORAL | Status: DC

## 2022-11-08 MED ORDER — PREDNISONE 20 MG PO TABS: 40.0000 mg | ORAL_TABLET | Freq: Every day | ORAL | Status: AC

## 2022-11-08 MED ORDER — FUROSEMIDE 40 MG PO TABS: 20.0000 mg | ORAL_TABLET | Freq: Every day | ORAL | Status: DC

## 2022-11-08 NOTE — [place] FL2 (Signed)
[place] MEDICAID FL2 LEVEL OF CARE FORM     IDENTIFICATION  Patient Name: Samantha Carpenter Birthdate: 1941-02-23 Sex: female Admission Date (Current Location): 11/06/2022  [place] and [place] Number:  Chiropodist and Address:  [facility], [address], [place], [place] [address]      Provider Number: [id number]  Attending Physician Name and Address:  Sunnie Nielsen, DO  Relative Name and Phone Number:  Robert Bellow [phone]    Current Level of Care: Hospital Recommended Level of Care: Memory Care Prior Approval Number:    Date Approved/Denied:   PASRR Number:    Discharge Plan: Other (Comment) (Memory Care Unit)    Current Diagnoses: Patient Active Problem List   Diagnosis Date Noted   Acute hyponatremia 04/23/2022   Chronic diastolic CHF (congestive heart failure) (HCC) 04/16/2022   Hyponatremia 04/16/2022   Hypothermia 04/16/2022   Hypomagnesemia 02/15/2022   RSV (respiratory syncytial virus pneumonia) 02/15/2022   COPD with acute exacerbation (HCC) 02/10/2022   Hypokalemia 02/10/2022   Falls frequently 02/10/2022   Hypoglycemia 01/11/2022   AKI (acute kidney injury) (HCC) 01/10/2022   Pressure injury of skin 01/09/2022   COPD exacerbation (HCC) 01/08/2022   Prediabetes 01/08/2022   Dementia with behavioral disturbance (HCC) 11/15/2021   Hypoxia 11/15/2021   COVID-19 virus infection 11/14/2021   Right rib fracture 11/09/2020   Fall at home, initial encounter 11/09/2020   COPD (chronic obstructive pulmonary disease) (HCC) 11/09/2020   Depression with anxiety 11/09/2020   Elevated troponin 11/09/2020   Acute respiratory failure with hypoxia (HCC) 11/09/2020   HTN (hypertension) 11/09/2020   Abrasions of multiple sites    Pure hypercholesterolemia 06/09/2018   Recurrent major depressive disorder, 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   HTN, goal below 140/80 04/28/2018    CKD (chronic kidney disease) stage 3, GFR 30-59 ml/min (HCC) 04/28/2018    Orientation RESPIRATION BLADDER Height & Weight     Self  O2 (2L per [place]) Incontinent Weight: 67 kg Height:  5\' 4"  (162.6 cm)  BEHAVIORAL SYMPTOMS/MOOD NEUROLOGICAL BOWEL NUTRITION STATUS      Incontinent  (See Discharge Summary)  AMBULATORY STATUS COMMUNICATION OF NEEDS Skin   Extensive Assist Verbally Normal                       Personal Care Assistance Level of Assistance  Bathing, Feeding, Dressing Bathing Assistance: Maximum assistance Feeding assistance: Limited assistance Dressing Assistance: Maximum assistance     Functional Limitations Info  Sight, Hearing, Speech Sight Info: Adequate Hearing Info: Adequate Speech Info: Adequate    SPECIAL CARE FACTORS FREQUENCY                       Contractures Contractures Info: Not present    Additional Factors Info  Code Status, Allergies Code Status Info: DNR Allergies Info: Morphine And Codeine, Latex           Current Medications (11/08/2022):  This is the current hospital active medication list Current Facility-Administered Medications  Medication Dose Route Frequency Provider Last Rate Last Admin   acetaminophen (TYLENOL) tablet 650 mg  650 mg Oral Q6H PRN Andris Baumann, MD       Or   acetaminophen (TYLENOL) suppository 650 mg  650 mg Rectal Q6H PRN Andris Baumann, MD       albuterol (PROVENTIL) (2.5 MG/3ML) 0.083% nebulizer solution 2.5 mg  2.5 mg Nebulization Q2H PRN  Andris Baumann, MD       amLODipine (NORVASC) tablet 10 mg  10 mg Oral Daily Andris Baumann, MD   10 mg at 11/08/22 2841   aspirin EC tablet 81 mg  81 mg Oral Daily Andris Baumann, MD   81 mg at 11/08/22 3244   bisoprolol (ZEBETA) tablet 5 mg  5 mg Oral Daily Andris Baumann, MD   5 mg at 11/08/22 0837   DULoxetine (CYMBALTA) DR capsule 60 mg  60 mg Oral Daily Andris Baumann, MD   60 mg at 11/08/22 0832   enoxaparin (LOVENOX) injection 40 mg  40 mg  Subcutaneous Q24H Lindajo Royal V, MD   40 mg at 11/08/22 0837   famotidine (PEPCID) tablet 20 mg  20 mg Oral Daily Andris Baumann, MD   20 mg at 11/08/22 0102   furosemide (LASIX) tablet 40 mg  40 mg Oral Daily Lindajo Royal V, MD   40 mg at 11/08/22 7253   ipratropium-albuterol (DUONEB) 0.5-2.5 (3) MG/3ML nebulizer solution 3 mL  3 mL Nebulization Q6H Lindajo Royal V, MD   3 mL at 11/08/22 0737   LORazepam (ATIVAN) injection 0.5-1 mg  0.5-1 mg Intravenous Q8H PRN Sunnie Nielsen, DO       losartan (COZAAR) tablet 100 mg  100 mg Oral Daily Lindajo Royal V, MD   100 mg at 11/08/22 0833   ondansetron (ZOFRAN) tablet 4 mg  4 mg Oral Q6H PRN Andris Baumann, MD       Or   ondansetron [facility]) injection 4 mg  4 mg Intravenous Q6H PRN Andris Baumann, MD       predniSONE (DELTASONE) tablet 40 mg  40 mg Oral Q breakfast Lindajo Royal V, MD   40 mg at 11/08/22 6644   QUEtiapine (SEROQUEL) tablet 50 mg  50 mg Oral QHS Sunnie Nielsen, DO   50 mg at 11/07/22 2225   rivastigmine (EXELON) capsule 1.5 mg  1.5 mg Oral BID Andris Baumann, MD   1.5 mg at 11/08/22 0347   spironolactone (ALDACTONE) tablet 25 mg  25 mg Oral Daily Andris Baumann, MD   25 mg at 11/08/22 4259     Discharge Medications: Please see discharge summary for a list of discharge medications. TAKE these medications     Acetaminophen Extra Strength 500 MG Tabs Take 500 mg by mouth every 4 (four) hours as needed (pain).    albuterol 108 (90 Base) MCG/ACT inhaler Commonly known as: VENTOLIN HFA Inhale 2 puffs into the lungs every 6 (six) hours as needed for wheezing or shortness of breath.    aspirin EC 81 MG tablet Take 81 mg by mouth daily.    bisoprolol 5 MG tablet Commonly known as: ZEBETA Take 1 tablet (5 mg total) by mouth daily.    clonazePAM 0.5 MG tablet Commonly known as: KLONOPIN Take 0.5 mg by mouth 3 (three) times daily. What changed: Another medication with the same name was removed. Continue taking this  medication, and follow the directions you see here.    DULoxetine 60 MG capsule Commonly known as: CYMBALTA Take 60 mg by mouth daily.    famotidine 20 MG tablet Commonly known as: Pepcid Take 1 tablet (20 mg total) by mouth daily.    fluticasone-salmeterol 250-50 MCG/ACT Aepb Commonly known as: ADVAIR Inhale 1 puff into the lungs in the morning and at bedtime. What changed: additional instructions    furosemide 40 MG tablet Commonly known  as: LASIX Take 0.5 tablets (20 mg total) by mouth daily. What changed: how much to take    gabapentin 100 MG capsule Commonly known as: NEURONTIN Take 100 mg by mouth 2 (two) times daily.    haloperidol 2 MG tablet Commonly known as: HALDOL Take 1 tablet (2 mg total) by mouth every 6 (six) hours as needed for agitation. What changed: Another medication with the same name was removed. Continue taking this medication, and follow the directions you see here.    ipratropium-albuterol 0.5-2.5 (3) MG/3ML Soln Commonly known as: DUONEB Take 3 mLs by nebulization in the morning, at noon, and at bedtime.    losartan 100 MG tablet Commonly known as: COZAAR Take 0.5 tablets (50 mg total) by mouth daily. What changed:  how much to take Another medication with the same name was removed. Continue taking this medication, and follow the directions you see here.    MAGNESIUM OXIDE 400 PO Take 1 tablet by mouth 2 (two) times daily as needed.    OLANZapine 5 MG tablet Commonly known as: ZYPREXA Take 2.5 mg by mouth 2 (two) times daily. At 1200 and 1800.    OXcarbazepine 150 MG tablet Commonly known as: TRILEPTAL Take 300 mg by mouth at bedtime.    OXcarbazepine 150 MG tablet Commonly known as: TRILEPTAL Take 150 mg by mouth in the morning.    polyethylene glycol 17 g packet Commonly known as: MIRALAX / GLYCOLAX Take 17 g by mouth daily.    predniSONE 20 MG tablet Commonly known as: DELTASONE Take 2 tablets (40 mg total) by mouth daily with  breakfast for 3 days. Start taking on: November 09, 2022 What changed:  medication strength how much to take how to take this when to take this additional instructions    QUEtiapine 50 MG tablet Commonly known as: SEROQUEL Take 1 tablet (50 mg total) by mouth at bedtime.    rivastigmine 1.5 MG capsule Commonly known as: EXELON Take 1.5 mg by mouth 2 (two) times daily.    Senna Plus 8.6-50 MG tablet Generic drug: senna-docusate Take 1 tablet by mouth at bedtime.    sodium chloride 1 g tablet Take 1 tablet (1 g total) by mouth 2 (two) times daily with a meal.    Spiriva Respimat 2.5 MCG/ACT Aers Generic drug: Tiotropium Bromide Monohydrate Inhale 2 puffs into the lungs daily.    spironolactone 25 MG tablet Commonly known as: ALDACTONE Take 0.5 tablets (12.5 mg total) by mouth daily. What changed: how much to take              Relevant Imaging Results:  Relevant Lab Results:   Additional Information [id number]  Garret Reddish, RN

## 2022-11-08 NOTE — Evaluation (Signed)
Occupational Therapy Evaluation Patient Details Name: Samantha Carpenter MRN: [id number] DOB: Nov 02, 1940 Today's Date: 11/08/2022   History of Present Illness Pt is an 82 year old female presenting to th eED after she slid to the floor out of her wheelchair onto the floor and hit her head. She had no LOC. Admitted with COPD with acute exacerbation     PMH significant for dCHF, hypertension, hyperlipidemia, COPD, dementia, depression   Clinical Impression   Pt is greeted in bed, alert, oriented to self and place. Pt is agreeable to OT evaluation. Pt requires increased time for processing and one step direction following, presents with impaired STM, safety awareness. Pt does have cognitive deficits at baseline. PTA pt lives at memory care at [facility] per chart review, pt unable to report where she lives. Pt presents with deficits in strength, endurance, activity tolerance, balance, cognition all affecting safe and optimal ADL completion. Pt will benefit from further skilled OT to address functional deficits and to facilitate safe ADL completion. OT will follow acutely.   Of note: pt on 2.5 L via [place], spo2 >90%, on RA prior to mobility, pt 88% on RA, therefore 2.5 L via [place] donned throughout mobility with spo2 >90%, HR 81 bpm following mobility and BP 140/96 (MAP 105). Pt did complain of dizziness throughout, however initial BP recorded before mobility was WNL.       If plan is discharge home, recommend the following: A little help with walking and/or transfers;A little help with bathing/dressing/bathroom;Assistance with cooking/housework;Direct supervision/assist for medications management;Assist for transportation;Supervision due to cognitive status;Direct supervision/assist for financial management;Help with stairs or ramp for entrance    Functional Status Assessment  Patient has had a recent decline in their functional status and demonstrates the ability to make significant improvements in  function in a reasonable and predictable amount of time.  Equipment Recommendations  BSC/3in1    Recommendations for Other Services       Precautions / Restrictions Precautions Precautions: Fall Precaution Comments: watch spo2 Restrictions Weight Bearing Restrictions: No      Mobility Bed Mobility Overal bed mobility: Needs Assistance Bed Mobility: Supine to Sit     Supine to sit: Supervision, HOB elevated          Transfers Overall transfer level: Needs assistance Equipment used: Rolling walker (2 wheels), 2 person hand held assist Transfers: Sit to/from Stand Sit to Stand: Contact guard assist, Min assist           General transfer comment: intermittent vcs for technique/safety      Balance Overall balance assessment: Needs assistance Sitting-balance support: Feet supported Sitting balance-Leahy Scale: Good     Standing balance support: No upper extremity supported Standing balance-Leahy Scale: Poor Standing balance comment: F standing balance with use of RW                           ADL either performed or assessed with clinical judgement   ADL Overall ADL's : Needs assistance/impaired Eating/Feeding: Set up;Sitting   Grooming: Wash/dry face;Sitting;Supervision/safety Grooming Details (indicate cue type and reason): anticipate CGA for standing grooming tasks             Lower Body Dressing: Moderate assistance Lower Body Dressing Details (indicate cue type and reason): socks in bed Toilet Transfer: Contact guard assist;Minimal assistance Toilet Transfer Details (indicate cue type and reason): simulated to bedside chair via HHA         Functional mobility during  ADLs: Contact guard assist;Minimal assistance;Rolling walker (2 wheels);Cueing for safety (household distances in room)       Vision Patient Visual Report: No change from baseline              Pertinent Vitals/Pain Pain Assessment Pain Assessment: No/denies pain      Extremity/Trunk Assessment Upper Extremity Assessment Upper Extremity Assessment: Generalized weakness   Lower Extremity Assessment Lower Extremity Assessment: Generalized weakness       Communication Communication Communication: No apparent difficulties Cueing Techniques: Verbal cues;Tactile cues;Visual cues   Cognition Arousal: Alert Behavior During Therapy: Flat affect Overall Cognitive Status: No family/caregiver present to determine baseline cognitive functioning Area of Impairment: Orientation, Attention, Memory, Following commands, Safety/judgement, Awareness, Problem solving                 Orientation Level: Disoriented to, Time, Situation Current Attention Level: Focused Memory: Decreased short-term memory Following Commands: Follows one step commands with increased time Safety/Judgement: Decreased awareness of deficits, Decreased awareness of safety Awareness: Emergent Problem Solving: Decreased initiation, Difficulty sequencing, Requires verbal cues, Slow processing, Requires tactile cues          Exercises Other Exercises Other Exercises: edu re: role of OT, role of rehab, safe ADL completion   Shoulder Instructions      Home Living Family/patient expects to be discharged to:: Assisted living                             Home Equipment: Gilmer Mor - single point   Additional Comments: [facility] memory care [facility]F per chart review, pt endorses she has been using a mwc lately      Prior Functioning/Environment Prior Level of Function : Patient poor historian/Family not available;History of Falls (last six months)             Mobility Comments: Pt unable to provide clear description of mobility device used, endorses she utilized mwc at some point, fall history ADLs Comments: pt reports she performs dressing, grooming, feeding with MOD I, intermittent supervision for bathing with/without DME, assist for IADLs; pt is a ?historian  however        OT Problem List: Decreased strength;Decreased activity tolerance;Decreased cognition;Decreased knowledge of use of DME or AE;Decreased safety awareness;Decreased knowledge of precautions;Impaired balance (sitting and/or standing)      OT Treatment/Interventions: Self-care/ADL training;DME and/or AE instruction;Therapeutic activities;Balance training;Therapeutic exercise;Patient/family education;Energy conservation    OT Goals(Current goals can be found in the care plan section) Acute Rehab OT Goals Patient Stated Goal: get to chair OT Goal Formulation: With patient Time For Goal Achievement: 11/22/22 Potential to Achieve Goals: Good ADL Goals Pt Will Perform Grooming: with supervision;standing Pt Will Perform Lower Body Dressing: with supervision;sit to/from stand Pt Will Transfer to Toilet: with supervision;ambulating Pt Will Perform Toileting - Clothing Manipulation and hygiene: with supervision;sit to/from stand  OT Frequency: Min 1X/week    Co-evaluation PT/OT/SLP Co-Evaluation/Treatment: Yes Reason for Co-Treatment: Necessary to address cognition/behavior during functional activity;To address functional/ADL transfers   OT goals addressed during session: ADL's and self-care      AM-PAC OT "6 Clicks" Daily Activity     Outcome Measure Help from another person eating meals?: None Help from another person taking care of personal grooming?: None Help from another person toileting, which includes using toliet, bedpan, or urinal?: A Little Help from another person bathing (including washing, rinsing, drying)?: A Lot Help from another person to put on and taking off  regular upper body clothing?: A Little Help from another person to put on and taking off regular lower body clothing?: A Lot 6 Click Score: 18   End of Session Equipment Utilized During Treatment: Rolling walker (2 wheels) Nurse Communication: Mobility status;Other (comment) (vitals)  Activity  Tolerance: Patient tolerated treatment well Patient left: in chair;with call bell/phone within reach;with chair alarm set  OT Visit Diagnosis: Other abnormalities of gait and mobility (R26.89)                Time: 4782-9562 OT Time Calculation (min): 22 min Charges:  OT General Charges $OT Visit: 1 Visit OT Evaluation $OT Eval Moderate Complexity: 1 Mod  Oleta Mouse, OTD OTR/L  11/08/22, 11:16 AM

## 2022-11-08 NOTE — TOC Transition Note (Signed)
Transition of Care [facility]) - CM/SW Discharge Note   Patient Details  Name: Samantha Carpenter MRN: [id number] Date of Birth: 10-30-1940  Transition [facility]) CM/SW Contact:  Garret Reddish, RN Phone Number: 11/08/2022, 1:12 PM   Clinical Narrative:   Chart reviewed.  I have spoken with Deanna Artis, Admission Coordinator at [facility].  I have informed her that patient would be a discharge for today.  I have informed Deanna Artis that patient would require home 02 at the facility and home health PT at the facility. I have informed Deanna Artis that I have arranged home 02 with [facility].  Deanna Artis informs me that their facility use [facility] for home health needs.  I have faxed Keisha patient's Fl 2 to [phone].    I have asked John with [facility] to arrange home 02 for Mrs. Dantin at the facility.  I have informed Jonny Ruiz that EMS will transport patient to the facility today and that patient will need 02 set up at the facility.    I have spoken with Shanda Bumps with [facility].  I have informed her that patient will need home health PT at the facility.  I have faxed Shanda Bumps patient's H and P, facesheet, and home health orders to [phone].    I have informed patient's sister [name] that patient would be a discharge for today.    [facility] will transport patient to the facility today.     Final next level of care: Memory Care Barriers to Discharge: No Barriers Identified   Patient Goals and CMS Choice   Choice offered to / list presented to : Adult Children  Discharge Placement                Patient chooses bed at:  ([facility]) Patient to be transferred to facility by: [facility] Name of family member notified: Dionisio David  [phone] Patient and family notified of of transfer: 11/08/22  Discharge Plan and Services Additional resources added to the After Visit Summary for                    DME Agency: [facility] Date DME Agency Contacted: 11/08/22 Time DME  Agency Contacted: 1230 Representative spoke with at DME Agency: Esperanza Richters Arranged: PT [facility] Agency: [facility] Date [facility] Agency Contacted: 11/08/22 Time HH Agency Contacted: 1230 Representative spoke with at [facility] Agency: Shanda Bumps  Social Determinants of Health (SDOH) Interventions SDOH Screenings   Food Insecurity: No Food Insecurity (11/07/2022)  Housing: Low Risk  (11/07/2022)  Transportation Needs: No Transportation Needs (11/07/2022)  Utilities: Not At Risk (11/07/2022)  Financial Resource Strain: Low Risk  (09/02/2018)  Physical Activity: Sufficiently Active (09/02/2018)  Social Connections: Unknown (09/02/2018)  Stress: No Stress Concern Present (09/02/2018)  Tobacco Use: Medium Risk (10/29/2022)     Readmission Risk Interventions    01/11/2022    2:05 PM 01/09/2022    2:23 PM  Readmission Risk Prevention Plan  Transportation Screening  Complete  HRI or Home Care Consult Complete   Palliative Care Screening  Not Applicable  Medication Review (RN Care Manager)  Complete

## 2022-11-08 NOTE — TOC Progression Note (Signed)
Transition of Care [facility]) - Progression Note    Patient Details  Name: Samantha Carpenter MRN: [id number] Date of Birth: 05/23/1940  [facility]) CM/SW Contact  Garret Reddish, RN Phone Number: 11/08/2022, 10:54 AM  Clinical Narrative:   Chart reviewed.  Noted that patient was admitted with COPD with acute exacerbation.    I have received a call from [place], Facilities manager at [facility].  She informs me that patient was in the Memory Care unit at their facility.  She reports that patient was able to get around with a cane.  She reports that for the last month patient has been falling a lot and has the staff has had to use a wheelchair at the facility.    Deanna Artis reports that patient is able to return back to the facility when she is stable.    I have spoken with Robert Bellow patient's sister and she would like for patient to return to [facility] on discharge.    TOC will continue to follow for discharge planning.           Expected Discharge Plan and Services                                               Social Determinants of Health (SDOH) Interventions SDOH Screenings   Food Insecurity: No Food Insecurity (11/07/2022)  Housing: Low Risk  (11/07/2022)  Transportation Needs: No Transportation Needs (11/07/2022)  Utilities: Not At Risk (11/07/2022)  Financial Resource Strain: Low Risk  (09/02/2018)  Physical Activity: Sufficiently Active (09/02/2018)  Social Connections: Unknown (09/02/2018)  Stress: No Stress Concern Present (09/02/2018)  Tobacco Use: Medium Risk (10/29/2022)    Readmission Risk Interventions    01/11/2022    2:05 PM 01/09/2022    2:23 PM  Readmission Risk Prevention Plan  Transportation Screening  Complete  HRI or Home Care Consult Complete   Palliative Care Screening  Not Applicable  Medication Review (RN Care Manager)  Complete

## 2022-11-08 NOTE — Progress Notes (Addendum)
SATURATION QUALIFICATIONS: (This note is used to comply with regulatory documentation for home oxygen)   Patient Saturations on Room Air while Ambulating = 88%  Patient Saturations on 2.5 Liters of oxygen while Ambulating = >90%   Please briefly explain why patient needs home oxygen: to maintain oxygen saturations within normal limits

## 2022-11-08 NOTE — Discharge Summary (Signed)
Physician Discharge Summary   Patient: Samantha Carpenter MRN: [id number]  DOB: 1940/06/22   Admit:     Date of Admission: 11/06/2022 Admitted from: SNF   Discharge: Date of discharge: 11/08/22 Disposition: Skilled nursing facility Condition at discharge: fair  CODE STATUS: DNR - form on chart      Discharge Physician: Sunnie Nielsen, DO [facility]     PCP: Pcp, No  Recommendations for Outpatient Follow-up:  Follow up with PCP Pcp, No in 1-2 weeks Please obtain labs/tests: CBC, BMP, Blood pressure in 2-3 days  Please follow up on the following pending results: none= Fall precautions    Discharge Instructions     Diet - low sodium heart healthy   Complete by: As directed    Increase activity slowly   Complete by: As directed          Discharge Diagnoses: Principal Problem:   COPD with acute exacerbation (HCC) Active Problems:   Fall at home, initial encounter   Elevated troponin   HTN (hypertension)   Dementia with behavioral disturbance (HCC)   Chronic diastolic CHF (congestive heart failure) (HCC)   Depression with anxiety       Hospital Course: Samantha Carpenter is a 82 y.o. female with medical history significant for dCHF, hypertension, hyperlipidemia, COPD, dementia, depression with anxiety with multiple recent falls who was sent to the ED via [facility] from the [facility] after she slid to the floor out of her wheelchair onto the floor and hit her head. She had no LOC.  08/20: Patient had a negative trauma workup on the ED but then started wheezing in the ED and has continued to wheeze with sats in the low 90s requiring multiple rounds duonebs. CT chest showed several thin-walled cavitary nodules within the left upper and right upper lobe of indeterminate nature with recommendation for noncontrast CT chest in 3 to 6 months. Hospitalist consulted for admission/observation.  Patient unable to contribute to history due to dementia.  08/21: 93%  on 2L O2. Pt is confused, disoriented, grabbing at her O2 cannula, sundowning. Pt's sister notes this sounds like baseline. Suspect needs memory care, unsafe d/c back to assisted living at this time given multiple recent visits for falls.  08/22: Pt from memory care, not assisted living, ok for discharge back      Consultants:  none  Procedures: none      ASSESSMENT & PLAN:    COPD with acute exacerbation (HCC) Mild acute on chronic hypoxic respiratory failure - resolved not on oxygen at baseline has been on BiPAP with past exacerbations Scheduled and as needed nebulized bronchodilators IV steroids Supplemental oxygen to keep sats over 92%   Fall at home (SNF long-term), initial encounter Frequent falls Patient slid out of her wheelchair and hit her head before she was caught CT head and C-spine nontraumatic Fall precautions PT/OT   HTN (hypertension) BP slightly elevated on admission  Continue home meds of losartan, bisoprolol, Catapres and amlodipine   Elevated troponin Slightly elevated troponin to 19  likely demand ischemia from wheezing Trended down Repeat if chest pain    Chronic diastolic CHF (congestive heart failure) (HCC) Clinically euvolemic Continue spironolactone, losartan, furosemide and bisoprolol   Dementia with behavioral disturbance (HCC) Patient reportedly restless getting out of bed the ED Continue quetiapine, rivastigmine Delirium precautions Blinds open during the day, closed at night, frequent reorientation, minimize nighttime interruptions When possible, minimize use of narcotics, benzodiazepines, anti cholingeneric, and antihistamine   Depression  with anxiety Continue duloxetine and Wellbutrin            Discharge Instructions  Allergies as of 11/08/2022       Reactions   Morphine And Codeine    Latex Rash   "I break out"        Medication List     STOP taking these medications    amLODipine 10 MG  tablet Commonly known as: NORVASC   buPROPion 150 MG 24 hr tablet Commonly known as: WELLBUTRIN XL   cloNIDine 0.1 MG tablet Commonly known as: CATAPRES   sucralfate 1 g tablet Commonly known as: Carafate       TAKE these medications    Acetaminophen Extra Strength 500 MG Tabs Take 500 mg by mouth every 4 (four) hours as needed (pain).   albuterol 108 (90 Base) MCG/ACT inhaler Commonly known as: VENTOLIN HFA Inhale 2 puffs into the lungs every 6 (six) hours as needed for wheezing or shortness of breath.   aspirin EC 81 MG tablet Take 81 mg by mouth daily.   bisoprolol 5 MG tablet Commonly known as: ZEBETA Take 1 tablet (5 mg total) by mouth daily.   clonazePAM 0.5 MG tablet Commonly known as: KLONOPIN Take 0.5 mg by mouth 3 (three) times daily. What changed: Another medication with the same name was removed. Continue taking this medication, and follow the directions you see here.   DULoxetine 60 MG capsule Commonly known as: CYMBALTA Take 60 mg by mouth daily.   famotidine 20 MG tablet Commonly known as: Pepcid Take 1 tablet (20 mg total) by mouth daily.   fluticasone-salmeterol 250-50 MCG/ACT Aepb Commonly known as: ADVAIR Inhale 1 puff into the lungs in the morning and at bedtime. What changed: additional instructions   furosemide 40 MG tablet Commonly known as: LASIX Take 0.5 tablets (20 mg total) by mouth daily. What changed: how much to take   gabapentin 100 MG capsule Commonly known as: NEURONTIN Take 100 mg by mouth 2 (two) times daily.   haloperidol 2 MG tablet Commonly known as: HALDOL Take 1 tablet (2 mg total) by mouth every 6 (six) hours as needed for agitation. What changed: Another medication with the same name was removed. Continue taking this medication, and follow the directions you see here.   ipratropium-albuterol 0.5-2.5 (3) MG/3ML Soln Commonly known as: DUONEB Take 3 mLs by nebulization in the morning, at noon, and at bedtime.    losartan 100 MG tablet Commonly known as: COZAAR Take 0.5 tablets (50 mg total) by mouth daily. What changed:  how much to take Another medication with the same name was removed. Continue taking this medication, and follow the directions you see here.   MAGNESIUM OXIDE 400 PO Take 1 tablet by mouth 2 (two) times daily as needed.   OLANZapine 5 MG tablet Commonly known as: ZYPREXA Take 2.5 mg by mouth 2 (two) times daily. At 1200 and 1800.   OXcarbazepine 150 MG tablet Commonly known as: TRILEPTAL Take 300 mg by mouth at bedtime.   OXcarbazepine 150 MG tablet Commonly known as: TRILEPTAL Take 150 mg by mouth in the morning.   polyethylene glycol 17 g packet Commonly known as: MIRALAX / GLYCOLAX Take 17 g by mouth daily.   predniSONE 20 MG tablet Commonly known as: DELTASONE Take 2 tablets (40 mg total) by mouth daily with breakfast for 3 days. Start taking on: November 09, 2022 What changed:  medication strength how much to take how to take this  when to take this additional instructions   QUEtiapine 50 MG tablet Commonly known as: SEROQUEL Take 1 tablet (50 mg total) by mouth at bedtime.   rivastigmine 1.5 MG capsule Commonly known as: EXELON Take 1.5 mg by mouth 2 (two) times daily.   Senna Plus 8.6-50 MG tablet Generic drug: senna-docusate Take 1 tablet by mouth at bedtime.   sodium chloride 1 g tablet Take 1 tablet (1 g total) by mouth 2 (two) times daily with a meal.   Spiriva Respimat 2.5 MCG/ACT Aers Generic drug: Tiotropium Bromide Monohydrate Inhale 2 puffs into the lungs daily.   spironolactone 25 MG tablet Commonly known as: ALDACTONE Take 0.5 tablets (12.5 mg total) by mouth daily. What changed: how much to take         Follow-up Information     [facility]   Why: As needed Contact information: [address] [place] [address] [phone]                 Allergies  Allergen Reactions   Morphine And  Codeine    Latex Rash    "I break out"     Subjective: pt reports feeling fine today, no CP/SOB   Discharge Exam: BP (!) 110/51 (BP Location: Left Arm)   Pulse 68   Temp (!) 97.5 F (36.4 C) (Oral)   Resp 16   Ht 5\' 4"  (1.626 m)   Wt 67 kg   SpO2 100%   BMI 25.35 kg/m  General: Pt is alert, awake, not in acute distress Cardiovascular: RRR, S1/S2 +, no rubs, no gallops Respiratory: CTA bilaterally, no wheezing, no rhonchi Abdominal: Soft, NT, ND, bowel sounds + Extremities: no edema, no cyanosis     The results of significant diagnostics from this hospitalization (including imaging, microbiology, ancillary and laboratory) are listed below for reference.     Microbiology: Recent Results (from the past 240 hour(s))  SARS Coronavirus 2 by RT PCR (hospital order, performed in [facility] hospital lab) *cepheid single result test* Anterior Nasal Swab     Status: None   Collection Time: 10/29/22  3:32 PM   Specimen: Anterior Nasal Swab  Result Value Ref Range Status   SARS Coronavirus 2 by RT PCR NEGATIVE NEGATIVE Final    Comment: (NOTE) SARS-CoV-2 target nucleic acids are NOT DETECTED.  The SARS-CoV-2 RNA is generally detectable in upper and lower respiratory specimens during the acute phase of infection. The lowest concentration of SARS-CoV-2 viral copies this assay can detect is 250 copies / mL. A negative result does not preclude SARS-CoV-2 infection and should not be used as the sole basis for treatment or other patient management decisions.  A negative result may occur with improper specimen collection / handling, submission of specimen other than nasopharyngeal swab, presence of viral mutation(s) within the areas targeted by this assay, and inadequate number of viral copies (<250 copies / mL). A negative result must be combined with clinical observations, patient history, and epidemiological information.  Fact Sheet for Patients:    RoadLapTop.co.za  Fact Sheet for Healthcare Providers: http://kim-miller.com/  This test is not yet approved or  cleared by the [place] FDA and has been authorized for detection and/or diagnosis of SARS-CoV-2 by FDA under an Emergency Use Authorization (EUA).  This EUA will remain in effect (meaning this test can be used) for the duration of the COVID-19 declaration under Section 564(b)(1) of the Act, 21 U.S.C. section 360bbb-3(b)(1), unless the authorization is terminated or revoked  sooner.  Performed at [facility], [address] [place], [place] [address]   SARS Coronavirus 2 by RT PCR (hospital order, performed in [facility] hospital lab) *cepheid single result test* Anterior Nasal Swab     Status: None   Collection Time: 11/06/22  9:35 PM   Specimen: Anterior Nasal Swab  Result Value Ref Range Status   SARS Coronavirus 2 by RT PCR NEGATIVE NEGATIVE Final    Comment: (NOTE) SARS-CoV-2 target nucleic acids are NOT DETECTED.  The SARS-CoV-2 RNA is generally detectable in upper and lower respiratory specimens during the acute phase of infection. The lowest concentration of SARS-CoV-2 viral copies this assay can detect is 250 copies / mL. A negative result does not preclude SARS-CoV-2 infection and should not be used as the sole basis for treatment or other patient management decisions.  A negative result may occur with improper specimen collection / handling, submission of specimen other than nasopharyngeal swab, presence of viral mutation(s) within the areas targeted by this assay, and inadequate number of viral copies (<250 copies / mL). A negative result must be combined with clinical observations, patient history, and epidemiological information.  Fact Sheet for Patients:   RoadLapTop.co.za  Fact Sheet for Healthcare Providers: http://kim-miller.com/  This test  is not yet approved or  cleared by the [place] FDA and has been authorized for detection and/or diagnosis of SARS-CoV-2 by FDA under an Emergency Use Authorization (EUA).  This EUA will remain in effect (meaning this test can be used) for the duration of the COVID-19 declaration under Section 564(b)(1) of the Act, 21 U.S.C. section 360bbb-3(b)(1), unless the authorization is terminated or revoked sooner.  Performed at [facility], [address] [place], [place] [address]      Labs: BNP (last 3 results) Recent Labs    02/15/22 0407 04/16/22 1139 04/28/22 1205  BNP 101.3* 109.6* 240.1*   Basic Metabolic Panel: Recent Labs  Lab 11/06/22 2045  NA 136  K 3.5  CL 94*  CO2 31  GLUCOSE 158*  BUN 14  CREATININE 0.82  CALCIUM 8.8*   Liver Function Tests: Recent Labs  Lab 11/06/22 2045  AST 20  ALT 17  ALKPHOS 86  BILITOT 0.7  PROT 6.9  ALBUMIN 3.6   No results for input(s): "LIPASE", "AMYLASE" in the last 168 hours. No results for input(s): "AMMONIA" in the last 168 hours. CBC: Recent Labs  Lab 11/06/22 2045  WBC 8.6  NEUTROABS 6.3  HGB 12.0  HCT 36.2  MCV 93.3  PLT 223   Cardiac Enzymes: No results for input(s): "CKTOTAL", "CKMB", "CKMBINDEX", "TROPONINI" in the last 168 hours. BNP: Invalid input(s): "POCBNP" CBG: No results for input(s): "GLUCAP" in the last 168 hours. D-Dimer No results for input(s): "DDIMER" in the last 72 hours. Hgb A1c No results for input(s): "HGBA1C" in the last 72 hours. Lipid Profile No results for input(s): "CHOL", "HDL", "LDLCALC", "TRIG", "CHOLHDL", "LDLDIRECT" in the last 72 hours. Thyroid function studies No results for input(s): "TSH", "T4TOTAL", "T3FREE", "THYROIDAB" in the last 72 hours.  Invalid input(s): "FREET3" Anemia work up No results for input(s): "VITAMINB12", "FOLATE", "FERRITIN", "TIBC", "IRON", "RETICCTPCT" in the last 72 hours. Urinalysis    Component Value Date/Time   COLORURINE  YELLOW (A) 10/29/2022 1720   APPEARANCEUR CLEAR (A) 10/29/2022 1720   LABSPEC 1.013 10/29/2022 1720   PHURINE 6.0 10/29/2022 1720   GLUCOSEU NEGATIVE 10/29/2022 1720   HGBUR NEGATIVE 10/29/2022 1720   BILIRUBINUR NEGATIVE 10/29/2022 1720  KETONESUR NEGATIVE 10/29/2022 1720   PROTEINUR NEGATIVE 10/29/2022 1720   NITRITE NEGATIVE 10/29/2022 1720   LEUKOCYTESUR TRACE (A) 10/29/2022 1720   Sepsis Labs Recent Labs  Lab 11/06/22 2045  WBC 8.6   Microbiology Recent Results (from the past 240 hour(s))  SARS Coronavirus 2 by RT PCR (hospital order, performed in [facility] hospital lab) *cepheid single result test* Anterior Nasal Swab     Status: None   Collection Time: 10/29/22  3:32 PM   Specimen: Anterior Nasal Swab  Result Value Ref Range Status   SARS Coronavirus 2 by RT PCR NEGATIVE NEGATIVE Final    Comment: (NOTE) SARS-CoV-2 target nucleic acids are NOT DETECTED.  The SARS-CoV-2 RNA is generally detectable in upper and lower respiratory specimens during the acute phase of infection. The lowest concentration of SARS-CoV-2 viral copies this assay can detect is 250 copies / mL. A negative result does not preclude SARS-CoV-2 infection and should not be used as the sole basis for treatment or other patient management decisions.  A negative result may occur with improper specimen collection / handling, submission of specimen other than nasopharyngeal swab, presence of viral mutation(s) within the areas targeted by this assay, and inadequate number of viral copies (<250 copies / mL). A negative result must be combined with clinical observations, patient history, and epidemiological information.  Fact Sheet for Patients:   RoadLapTop.co.za  Fact Sheet for Healthcare Providers: http://kim-miller.com/  This test is not yet approved or  cleared by the [place] FDA and has been authorized for detection and/or diagnosis of SARS-CoV-2  by FDA under an Emergency Use Authorization (EUA).  This EUA will remain in effect (meaning this test can be used) for the duration of the COVID-19 declaration under Section 564(b)(1) of the Act, 21 U.S.C. section 360bbb-3(b)(1), unless the authorization is terminated or revoked sooner.  Performed at [facility], [address] [place], [place] [address]   SARS Coronavirus 2 by RT PCR (hospital order, performed in [facility] hospital lab) *cepheid single result test* Anterior Nasal Swab     Status: None   Collection Time: 11/06/22  9:35 PM   Specimen: Anterior Nasal Swab  Result Value Ref Range Status   SARS Coronavirus 2 by RT PCR NEGATIVE NEGATIVE Final    Comment: (NOTE) SARS-CoV-2 target nucleic acids are NOT DETECTED.  The SARS-CoV-2 RNA is generally detectable in upper and lower respiratory specimens during the acute phase of infection. The lowest concentration of SARS-CoV-2 viral copies this assay can detect is 250 copies / mL. A negative result does not preclude SARS-CoV-2 infection and should not be used as the sole basis for treatment or other patient management decisions.  A negative result may occur with improper specimen collection / handling, submission of specimen other than nasopharyngeal swab, presence of viral mutation(s) within the areas targeted by this assay, and inadequate number of viral copies (<250 copies / mL). A negative result must be combined with clinical observations, patient history, and epidemiological information.  Fact Sheet for Patients:   RoadLapTop.co.za  Fact Sheet for Healthcare Providers: http://kim-miller.com/  This test is not yet approved or  cleared by the [place] FDA and has been authorized for detection and/or diagnosis of SARS-CoV-2 by FDA under an Emergency Use Authorization (EUA).  This EUA will remain in effect (meaning this test can be used) for the duration of  the COVID-19 declaration under Section 564(b)(1) of the Act, 21 U.S.C. section 360bbb-3(b)(1), unless the authorization is terminated or  revoked sooner.  Performed at [facility], [address] [place], [place] [address]    Imaging CT CHEST WO CONTRAST  Result Date: 11/06/2022 CLINICAL DATA:  Abnormal xray - lung nodule, >= 1 cm nodular opacity on xray, short of breath EXAM: CT CHEST WITHOUT CONTRAST TECHNIQUE: Multidetector CT imaging of the chest was performed following the standard protocol without IV contrast. RADIATION DOSE REDUCTION: This exam was performed according to the departmental dose-optimization program which includes automated exposure control, adjustment of the mA and/or kV according to patient size and/or use of iterative reconstruction technique. COMPARISON:  11/09/2020 FINDINGS: Cardiovascular: Extensive multi-vessel coronary artery calcification. Global cardiac size within normal limits. No pericardial effusion. The central pulmonary arteries are of normal caliber. Extensive atherosclerotic calcification within the thoracic aorta. No aortic aneurysm. Mediastinum/Nodes: No enlarged mediastinal or axillary lymph nodes. Thyroid gland, trachea, and esophagus demonstrate no significant findings. Lungs/Pleura: Moderate emphysema. There is asymmetric bronchial wall thickening and scattered airway impaction within the left lower lobe in keeping with an infectious or inflammatory bronchiolitis. The nodule noted on previously performed chest radiograph corresponds to multiple healed left anterior rib fractures with exuberant callus formation involving the left fourth rib. There are, however, development of several thin walled cavitary nodules within the left upper lobe at axial image # 54 and right upper lobe at axial image # 58, series 3 which are indeterminate, possibly the sequela of interval infection or inflammation. These measure 7 mm in mean diameter. No pneumothorax or  pleural effusion. Upper Abdomen: No acute abnormality. Musculoskeletal: Multiple remote midthoracic compression deformities are noted. As noted above, multiple healed left anterior rib fractures involving ribs 4-8. No acute bone abnormality. No lytic or blastic bone lesion. IMPRESSION: 1. The nodule noted on previously performed chest radiograph corresponds to multiple healed left anterior rib fractures with exuberant callus formation involving the left fourth rib. 2. Development of several thin walled cavitary nodules within the left upper lobe and right upper lobe which are indeterminate, possibly the sequela of interval infection or inflammation. Non-contrast chest CT at 3-6 months is recommended. If the nodules are stable at time of repeat CT, then future CT at 18-24 months (from today's scan) is considered optional for low-risk patients, but is recommended for high-risk patients. This recommendation follows the consensus statement: Guidelines for Management of Incidental Pulmonary Nodules Detected on CT Images: From the Fleischner Society 2017; Radiology 2017; 284:228-243. 3. Asymmetric bronchial wall thickening and scattered airway impaction within the left lower lobe in keeping with an infectious or inflammatory bronchiolitis. 4. Extensive multi-vessel coronary artery calcification. Aortic Atherosclerosis (ICD10-I70.0) and Emphysema (ICD10-J43.9). Electronically Signed   By: Helyn Numbers M.D.   On: 11/06/2022 22:54   DG Chest 2 View  Result Date: 11/06/2022 CLINICAL DATA:  Wheezing EXAM: CHEST - 2 VIEW COMPARISON:  Radiographs 10/29/2022 FINDINGS: Stable cardiomediastinal silhouette. Aortic atherosclerotic calcification. Hyperinflation and chronic bronchitic change. No focal consolidation, pleural effusion, or pneumothorax. 1.3 cm nodular opacity projecting over the left mid lung. No displaced rib fractures. Chronic compression of a upper thoracic vertebral body. IMPRESSION: No acute cardiopulmonary  disease. 1.3 cm nodular opacity projecting over the left mid lung. CT may be helpful for further evaluation. Electronically Signed   By: Minerva Fester M.D.   On: 11/06/2022 20:55   CT HEAD WO CONTRAST ( )  Result Date: 11/06/2022 CLINICAL DATA:  Head trauma, minor. Additional history obtained from electronic MEDICAL RECORD NUMBERFall from wheelchair (hitting back of head). Subsequent lethargy. History of dementia. EXAM:  CT HEAD WITHOUT CONTRAST TECHNIQUE: Contiguous axial images were obtained from the base of the skull through the vertex without intravenous contrast. RADIATION DOSE REDUCTION: This exam was performed according to the departmental dose-optimization program which includes automated exposure control, adjustment of the mA and/or kV according to patient size and/or use of iterative reconstruction technique. COMPARISON:  Head CT 11/03/2022. FINDINGS: Brain: Generalized cerebral atrophy. Patchy and ill-defined hypoattenuation within the cerebral white matter, nonspecific but compatible with chronic small vessel ischemic disease. There is no acute intracranial hemorrhage. No demarcated cortical infarct. No extra-axial fluid collection. No evidence of an intracranial mass. No midline shift. Vascular: No hyperdense vessel.  Atherosclerotic calcifications. Skull: No calvarial fracture or aggressive osseous lesion. Sinuses/Orbits: No mass or acute finding within the imaged orbits. No significant paranasal sinus disease. IMPRESSION: 1. No evidence of an acute intracranial abnormality. 2. Parenchymal atrophy and chronic small vessel ischemic disease. Electronically Signed   By: Jackey Loge D.O.   On: 11/06/2022 16:20      Time coordinating discharge: over 30 minutes  SIGNED:  Sunnie Nielsen DO [facility]

## 2022-11-08 NOTE — TOC Progression Note (Signed)
Transition of Care [facility]) - Progression Note    Patient Details  Name: Samantha Carpenter MRN: [id number] Date of Birth: 07/24/1940  Transition of Care [facility]) CM/SW Contact  Chapman Fitch, RN Phone Number: 11/08/2022, 4:33 PM  Clinical Narrative:     Confirmed with Jon with [employer] that delivery driver Pennie Rushing is in route with O2 to the [facility] transport arranged through [employer].  [facility] notified that patient would not be ready until 5 pm in order to provide time for O2 to be set up    Barriers to Discharge: No Barriers Identified  Expected Discharge Plan and Services         Expected Discharge Date: 11/08/22                 DME Agency: [employer] Date DME Agency Contacted: 11/08/22 Time DME Agency Contacted: 1230 Representative spoke with at DME Agency: Esperanza Richters Arranged: PT [facility] Agency: [facility] Date [facility] Agency Contacted: 11/08/22 Time HH Agency Contacted: 1230 Representative spoke with at [facility] Agency: Shanda Bumps   Social Determinants of Health (SDOH) Interventions SDOH Screenings   Food Insecurity: No Food Insecurity (11/07/2022)  Housing: Low Risk  (11/07/2022)  Transportation Needs: No Transportation Needs (11/07/2022)  Utilities: Not At Risk (11/07/2022)  Financial Resource Strain: Low Risk  (09/02/2018)  Physical Activity: Sufficiently Active (09/02/2018)  Social Connections: Unknown (09/02/2018)  Stress: No Stress Concern Present (09/02/2018)  Tobacco Use: Medium Risk (10/29/2022)    Readmission Risk Interventions    01/11/2022    2:05 PM 01/09/2022    2:23 PM  Readmission Risk Prevention Plan  Transportation Screening  Complete  HRI or Home Care Consult Complete   Palliative Care Screening  Not Applicable  Medication Review (RN Care Manager)  Complete

## 2022-11-08 NOTE — Progress Notes (Signed)
Physical Therapy Evaluation Patient Details Name: Samantha Carpenter MRN: [id number] DOB: 10-18-1940 Today's Date: 11/08/2022  History of Present Illness  Pt is an 82 year old female presenting to th eED after she slid to the floor out of her wheelchair onto the floor and hit her head. She had no LOC. Admitted with COPD with acute exacerbation     PMH significant for dCHF, hypertension, hyperlipidemia, COPD, dementia, depression.   Clinical Impression  Patient in bed upon arrival, agreeable to PT evaluation. Patient was alert, only oriented to self and place. Patient requires increased time for processing and single step directions during evaluation. Cognitive deficits at baseline. Prior to admission, patient lives at memory care at [facility] per chart review. Patient was able to complete mobility with Min A to CGA, and able to ambulate 25 ft with use of RW. Patient did desat on RA to 88% seated EOB, therefore 2.5 L of supplement oxygen via Walker donned with mobility. Vitals stable, despite patient complaint of dizziness with standing. Patient will benefit from skilled acute PT services to address impairments and to maximize safety with functional mobility. Will continue to follow acutely.       If plan is discharge home, recommend the following: A little help with walking and/or transfers;A little help with bathing/dressing/bathroom;Assist for transportation;Help with stairs or ramp for entrance   Can travel by private vehicle        Equipment Recommendations BSC/3in1  Recommendations for Other Services       Functional Status Assessment Patient has had a recent decline in their functional status and demonstrates the ability to make significant improvements in function in a reasonable and predictable amount of time.     Precautions / Restrictions Precautions Precautions: Fall Precaution Comments: Monitor Sp02 Restrictions Weight Bearing Restrictions: No      Mobility  Bed  Mobility Overal bed mobility: Needs Assistance Bed Mobility: Supine to Sit     Supine to sit: Supervision, HOB elevated     General bed mobility comments: increased time required; supervision with vitals monitored.  Removed supplemental oxygen with vitals monitored, desat to 88%. Placed on 2L supplemental oxygen with vitals stabilizing    Transfers Overall transfer level: Needs assistance Equipment used: Rolling walker (2 wheels), 2 person hand held assist Transfers: Sit to/from Stand Sit to Stand: Contact guard assist, Min assist           General transfer comment: initial sit > stand from EOB with HHA and Min A. Cues for technique/hand placement. second sit > stand completed from recliner with use of RW. Able to complete with CGA    Ambulation/Gait Ambulation/Gait assistance: Contact guard assist Gait Distance (Feet): 25 Feet Assistive device: Rolling walker (2 wheels) Gait Pattern/deviations: Step-through pattern Gait velocity: Decreased     General Gait Details: mild unsteadiness with ambulation, narrow BOS requiring CGA and cues for safety.  Stairs            Wheelchair Mobility     Tilt Bed    Modified Rankin (Stroke Patients Only)       Balance Overall balance assessment: Needs assistance Sitting-balance support: Feet supported Sitting balance-Leahy Scale: Good     Standing balance support: Bilateral upper extremity supported, During functional activity, Reliant on assistive device for balance Standing balance-Leahy Scale: Fair Standing balance comment: poor standing balance without use of RW; fair with use of RW.  Pertinent Vitals/Pain Pain Assessment Pain Assessment: No/denies pain    Home Living Family/patient expects to be discharged to:: Assisted living                 Home Equipment: Gilmer Mor - single point Additional Comments: [facility] memory care [facility] per chart review, pt endorses she  has been using a a manual wheelchair. When PT broughout RW, patient does report she uses one but unable to determine frequent and assist level needed due to cognitive impairment.    Prior Function Prior Level of Function : Patient poor historian/Family not available;History of Falls (last six months)             Mobility Comments: Pt unable to provide clear description of mobility device used, endorses she utilized mwc at some point, fall history ADLs Comments: Pt reports she performs dressing, grooming, feeding with MOD I, intermittent supervision for bathing with/without DME, assist for IADLs; Unsure of validity due to pt is a poor historian     Extremity/Trunk Assessment   Upper Extremity Assessment Upper Extremity Assessment: Generalized weakness    Lower Extremity Assessment Lower Extremity Assessment: Generalized weakness       Communication   Communication Communication: No apparent difficulties Cueing Techniques: Verbal cues;Tactile cues;Visual cues  Cognition Arousal: Alert Behavior During Therapy: Flat affect Overall Cognitive Status: No family/caregiver present to determine baseline cognitive functioning                   Orientation Level: Disoriented to, Time, Situation       Safety/Judgement: Decreased awareness of deficits, Decreased awareness of safety   Problem Solving: Decreased initiation          General Comments      Exercises Other Exercises Other Exercises: Educated role of acute care skilled PT.   Assessment/Plan    PT Assessment Patient needs continued PT services  PT Problem List Decreased strength;Decreased activity tolerance;Decreased balance;Decreased mobility;Decreased knowledge of use of DME       PT Treatment Interventions DME instruction;Gait training;Functional mobility training;Therapeutic activities;Therapeutic exercise;Balance training;Neuromuscular re-education    PT Goals (Current goals can be found in the Care  Plan section)  Acute Rehab PT Goals Patient Stated Goal:  (patient unable to state goal) PT Goal Formulation: With patient Time For Goal Achievement: 11/22/22 Potential to Achieve Goals: Fair    Frequency Min 1X/week     Co-evaluation PT/OT/SLP Co-Evaluation/Treatment: Yes Reason for Co-Treatment: Necessary to address cognition/behavior during functional activity;To address functional/ADL transfers PT goals addressed during session: Mobility/safety with mobility OT goals addressed during session: ADL's and self-care       AM-PAC PT "6 Clicks" Mobility  Outcome Measure Help needed turning from your back to your side while in a flat bed without using bedrails?: A Little Help needed moving from lying on your back to sitting on the side of a flat bed without using bedrails?: A Little Help needed moving to and from a bed to a chair (including a wheelchair)?: A Little Help needed standing up from a chair using your arms (e.g., wheelchair or bedside chair)?: A Little Help needed to walk in hospital room?: A Little Help needed climbing 3-5 steps with a railing? : A Lot 6 Click Score: 17    End of Session Equipment Utilized During Treatment: Gait belt Activity Tolerance: Patient tolerated treatment well Patient left: in bed;with bed alarm set;with call bell/phone within reach Nurse Communication: Mobility status PT Visit Diagnosis: Unsteadiness on feet (R26.81);Other abnormalities of  gait and mobility (R26.89);Repeated falls (R29.6);Muscle weakness (generalized) (M62.81);History of falling (Z91.81);Difficulty in walking, not elsewhere classified (R26.2)    Time: 1610-9604 PT Time Calculation (min) (ACUTE ONLY): 22 min   Charges:   PT Evaluation $PT Eval Low Complexity: 1 Low   PT General Charges $$ ACUTE PT VISIT: 1 Visit         Creed Copper Fairly, PT, DPT 11/08/22 11:39 AM

## 2023-01-08 ENCOUNTER — Other Ambulatory Visit: Payer: Self-pay

## 2023-01-08 ENCOUNTER — Inpatient Hospital Stay
Admission: EM | Admit: 2023-01-08 | Discharge: 2023-01-24 | DRG: 193 | Disposition: A | Discharge status: Skilled Nursing Facility | Payer: Medicare HMO | Source: Skilled Nursing Facility | Attending: Student | Admitting: Student

## 2023-01-08 ENCOUNTER — Emergency Department: Payer: Medicare HMO

## 2023-01-08 DIAGNOSIS — N1831 Chronic kidney disease, stage 3a: Secondary | ICD-10-CM | POA: Diagnosis present

## 2023-01-08 DIAGNOSIS — J9601 Acute respiratory failure with hypoxia: Secondary | ICD-10-CM | POA: Diagnosis present

## 2023-01-08 DIAGNOSIS — E785 Hyperlipidemia, unspecified: Secondary | ICD-10-CM | POA: Diagnosis present

## 2023-01-08 DIAGNOSIS — Z7951 Long term (current) use of inhaled steroids: Secondary | ICD-10-CM

## 2023-01-08 DIAGNOSIS — E876 Hypokalemia: Secondary | ICD-10-CM | POA: Diagnosis present

## 2023-01-08 DIAGNOSIS — Z818 Family history of other mental and behavioral disorders: Secondary | ICD-10-CM | POA: Diagnosis not present

## 2023-01-08 DIAGNOSIS — E870 Hyperosmolality and hypernatremia: Secondary | ICD-10-CM | POA: Diagnosis present

## 2023-01-08 DIAGNOSIS — J9602 Acute respiratory failure with hypercapnia: Secondary | ICD-10-CM | POA: Diagnosis present

## 2023-01-08 DIAGNOSIS — E873 Alkalosis: Secondary | ICD-10-CM | POA: Diagnosis present

## 2023-01-08 DIAGNOSIS — F05 Delirium due to known physiological condition: Secondary | ICD-10-CM | POA: Diagnosis not present

## 2023-01-08 DIAGNOSIS — F03918 Unspecified dementia, unspecified severity, with other behavioral disturbance: Secondary | ICD-10-CM | POA: Diagnosis present

## 2023-01-08 DIAGNOSIS — I5032 Chronic diastolic (congestive) heart failure: Secondary | ICD-10-CM | POA: Diagnosis not present

## 2023-01-08 DIAGNOSIS — Z66 Do not resuscitate: Secondary | ICD-10-CM | POA: Diagnosis present

## 2023-01-08 DIAGNOSIS — Z87891 Personal history of nicotine dependence: Secondary | ICD-10-CM

## 2023-01-08 DIAGNOSIS — J440 Chronic obstructive pulmonary disease with acute lower respiratory infection: Secondary | ICD-10-CM | POA: Diagnosis present

## 2023-01-08 DIAGNOSIS — I5033 Acute on chronic diastolic (congestive) heart failure: Secondary | ICD-10-CM | POA: Diagnosis present

## 2023-01-08 DIAGNOSIS — T17828A Food in other parts of respiratory tract causing other injury, initial encounter: Secondary | ICD-10-CM | POA: Diagnosis not present

## 2023-01-08 DIAGNOSIS — Z7982 Long term (current) use of aspirin: Secondary | ICD-10-CM | POA: Diagnosis not present

## 2023-01-08 DIAGNOSIS — R7303 Prediabetes: Secondary | ICD-10-CM | POA: Diagnosis present

## 2023-01-08 DIAGNOSIS — W44F3XA Food entering into or through a natural orifice, initial encounter: Secondary | ICD-10-CM | POA: Diagnosis not present

## 2023-01-08 DIAGNOSIS — I10 Essential (primary) hypertension: Secondary | ICD-10-CM | POA: Diagnosis present

## 2023-01-08 DIAGNOSIS — Z1152 Encounter for screening for COVID-19: Secondary | ICD-10-CM

## 2023-01-08 DIAGNOSIS — R296 Repeated falls: Secondary | ICD-10-CM

## 2023-01-08 DIAGNOSIS — J441 Chronic obstructive pulmonary disease with (acute) exacerbation: Secondary | ICD-10-CM | POA: Diagnosis present

## 2023-01-08 DIAGNOSIS — I130 Hypertensive heart and chronic kidney disease with heart failure and stage 1 through stage 4 chronic kidney disease, or unspecified chronic kidney disease: Secondary | ICD-10-CM | POA: Diagnosis present

## 2023-01-08 DIAGNOSIS — R1310 Dysphagia, unspecified: Secondary | ICD-10-CM | POA: Diagnosis present

## 2023-01-08 DIAGNOSIS — F0392 Unspecified dementia, unspecified severity, with psychotic disturbance: Secondary | ICD-10-CM | POA: Diagnosis present

## 2023-01-08 DIAGNOSIS — Z90710 Acquired absence of both cervix and uterus: Secondary | ICD-10-CM

## 2023-01-08 DIAGNOSIS — Z515 Encounter for palliative care: Secondary | ICD-10-CM | POA: Diagnosis not present

## 2023-01-08 DIAGNOSIS — J189 Pneumonia, unspecified organism: Principal | ICD-10-CM

## 2023-01-08 DIAGNOSIS — Z79899 Other long term (current) drug therapy: Secondary | ICD-10-CM

## 2023-01-08 LAB — MAGNESIUM: Magnesium: 2.6 mg/dL — ABNORMAL HIGH (ref 1.7–2.4)

## 2023-01-08 LAB — CBC WITH DIFFERENTIAL/PLATELET
Abs Immature Granulocytes: 0.02 10*3/uL (ref 0.00–0.07)
Basophils Absolute: 0 10*3/uL (ref 0.0–0.1)
Basophils Relative: 0 %
Eosinophils Absolute: 0 10*3/uL (ref 0.0–0.5)
Eosinophils Relative: 0 %
HCT: 39.7 % (ref 36.0–46.0)
Hemoglobin: 12.4 g/dL (ref 12.0–15.0)
Immature Granulocytes: 0 %
Lymphocytes Relative: 8 %
Lymphs Abs: 0.8 10*3/uL (ref 0.7–4.0)
MCH: 31.3 pg (ref 26.0–34.0)
MCHC: 31.2 g/dL (ref 30.0–36.0)
MCV: 100.3 fL — ABNORMAL HIGH (ref 80.0–100.0)
Monocytes Absolute: 1.2 10*3/uL — ABNORMAL HIGH (ref 0.1–1.0)
Monocytes Relative: 12 %
Neutro Abs: 8.4 10*3/uL — ABNORMAL HIGH (ref 1.7–7.7)
Neutrophils Relative %: 80 %
Platelets: 305 10*3/uL (ref 150–400)
RBC: 3.96 MIL/uL (ref 3.87–5.11)
RDW: 12.9 % (ref 11.5–15.5)
WBC: 10.6 10*3/uL — ABNORMAL HIGH (ref 4.0–10.5)
nRBC: 0 % (ref 0.0–0.2)

## 2023-01-08 LAB — BRAIN NATRIURETIC PEPTIDE: B Natriuretic Peptide: 992.3 pg/mL — ABNORMAL HIGH (ref 0.0–100.0)

## 2023-01-08 LAB — COMPREHENSIVE METABOLIC PANEL
ALT: 17 U/L (ref 0–44)
AST: 19 U/L (ref 15–41)
Albumin: 4.1 g/dL (ref 3.5–5.0)
Alkaline Phosphatase: 91 U/L (ref 38–126)
Anion gap: 12 (ref 5–15)
BUN: 25 mg/dL — ABNORMAL HIGH (ref 8–23)
CO2: 30 mmol/L (ref 22–32)
Calcium: 9.4 mg/dL (ref 8.9–10.3)
Chloride: 97 mmol/L — ABNORMAL LOW (ref 98–111)
Creatinine, Ser: 1.19 mg/dL — ABNORMAL HIGH (ref 0.44–1.00)
GFR, Estimated: 45 mL/min — ABNORMAL LOW (ref >60)
Glucose, Bld: 158 mg/dL — ABNORMAL HIGH (ref 70–99)
Potassium: 4 mmol/L (ref 3.5–5.1)
Sodium: 139 mmol/L (ref 135–145)
Total Bilirubin: 0.5 mg/dL (ref 0.3–1.2)
Total Protein: 8 g/dL (ref 6.5–8.1)

## 2023-01-08 LAB — URINALYSIS, W/ REFLEX TO CULTURE (INFECTION SUSPECTED)
Bacteria, UA: NONE SEEN
Bilirubin Urine: NEGATIVE
Glucose, UA: NEGATIVE mg/dL
Hgb urine dipstick: NEGATIVE
Ketones, ur: NEGATIVE mg/dL
Leukocytes,Ua: NEGATIVE
Nitrite: NEGATIVE
Protein, ur: 30 mg/dL — AB
Specific Gravity, Urine: 1.015 (ref 1.005–1.030)
pH: 5 (ref 5.0–8.0)

## 2023-01-08 LAB — LACTIC ACID, PLASMA: Lactic Acid, Venous: 1.3 mmol/L (ref 0.5–1.9)

## 2023-01-08 LAB — PROCALCITONIN: Procalcitonin: 0.72 ng/mL

## 2023-01-08 LAB — STREP PNEUMONIAE URINARY ANTIGEN: Strep Pneumo Urinary Antigen: NEGATIVE

## 2023-01-08 LAB — RESP PANEL BY RT-PCR (RSV, FLU A&B, COVID)  RVPGX2
Influenza A by PCR: NEGATIVE
Influenza B by PCR: NEGATIVE
Resp Syncytial Virus by PCR: NEGATIVE
SARS Coronavirus 2 by RT PCR: NEGATIVE

## 2023-01-08 LAB — PROTIME-INR
INR: 1.1 (ref 0.8–1.2)
Prothrombin Time: 14 s (ref 11.4–15.2)

## 2023-01-08 LAB — TROPONIN I (HIGH SENSITIVITY): Troponin I (High Sensitivity): 57 ng/L — ABNORMAL HIGH (ref <18)

## 2023-01-08 MED ORDER — CLONAZEPAM 0.5 MG PO TABS
0.5000 mg | ORAL_TABLET | Freq: Three times a day (TID) | ORAL | Status: DC
  Administered 2023-01-09: 0.5 mg via ORAL
  Filled 2023-01-08: qty 1

## 2023-01-08 MED ORDER — THIAMINE MONONITRATE 100 MG PO TABS
100.0000 mg | ORAL_TABLET | Freq: Every day | ORAL | Status: DC
Start: 2023-01-08 — End: 2023-01-08

## 2023-01-08 MED ORDER — OXCARBAZEPINE 300 MG PO TABS
300.0000 mg | ORAL_TABLET | Freq: Every day | ORAL | Status: DC
  Administered 2023-01-09 – 2023-01-23 (×15): 300 mg via ORAL
  Filled 2023-01-08 (×16): qty 1

## 2023-01-08 MED ORDER — ENOXAPARIN SODIUM 40 MG/0.4ML IJ SOSY
40.0000 mg | PREFILLED_SYRINGE | INTRAMUSCULAR | Status: DC
  Administered 2023-01-08 – 2023-01-17 (×10): 40 mg via SUBCUTANEOUS
  Filled 2023-01-08 (×10): qty 0.4

## 2023-01-08 MED ORDER — ONDANSETRON HCL 4 MG PO TABS: 4.0000 mg | ORAL_TABLET | Freq: Four times a day (QID) | ORAL | Status: DC | PRN

## 2023-01-08 MED ORDER — HYDRALAZINE HCL 20 MG/ML IJ SOLN: 10.0000 mg | Freq: Four times a day (QID) | INTRAMUSCULAR | Status: DC | PRN

## 2023-01-08 MED ORDER — GABAPENTIN 100 MG PO CAPS
100.0000 mg | ORAL_CAPSULE | Freq: Two times a day (BID) | ORAL | Status: DC
  Administered 2023-01-09 – 2023-01-15 (×13): 100 mg via ORAL
  Filled 2023-01-08 (×14): qty 1

## 2023-01-08 MED ORDER — LOSARTAN POTASSIUM 50 MG PO TABS
100.0000 mg | ORAL_TABLET | Freq: Every day | ORAL | Status: DC
  Administered 2023-01-09 – 2023-01-24 (×16): 100 mg via ORAL
  Filled 2023-01-08 (×16): qty 2

## 2023-01-08 MED ORDER — VANCOMYCIN HCL IN DEXTROSE 1-5 GM/200ML-% IV SOLN
1000.0000 mg | Freq: Once | INTRAVENOUS | Status: AC
  Administered 2023-01-08: 1000 mg via INTRAVENOUS
  Filled 2023-01-08: qty 200

## 2023-01-08 MED ORDER — FAMOTIDINE 20 MG PO TABS
20.0000 mg | ORAL_TABLET | Freq: Every day | ORAL | Status: DC
  Administered 2023-01-09 – 2023-01-24 (×16): 20 mg via ORAL
  Filled 2023-01-08 (×16): qty 1

## 2023-01-08 MED ORDER — HYDRALAZINE HCL 20 MG/ML IJ SOLN
5.0000 mg | Freq: Four times a day (QID) | INTRAMUSCULAR | Status: DC | PRN
  Administered 2023-01-08: 5 mg via INTRAVENOUS
  Filled 2023-01-08: qty 1

## 2023-01-08 MED ORDER — SODIUM CHLORIDE 0.9 % IV SOLN
500.0000 mg | INTRAVENOUS | Status: AC
  Administered 2023-01-08 – 2023-01-12 (×5): 500 mg via INTRAVENOUS
  Filled 2023-01-08 (×5): qty 5

## 2023-01-08 MED ORDER — PREDNISONE 20 MG PO TABS
40.0000 mg | ORAL_TABLET | Freq: Every day | ORAL | Status: DC
  Administered 2023-01-10: 40 mg via ORAL
  Filled 2023-01-08: qty 2

## 2023-01-08 MED ORDER — ADULT MULTIVITAMIN W/MINERALS CH: 1.0000 | ORAL_TABLET | Freq: Every day | ORAL | Status: DC

## 2023-01-08 MED ORDER — ASPIRIN 81 MG PO TBEC
81.0000 mg | DELAYED_RELEASE_TABLET | Freq: Every day | ORAL | Status: DC
  Administered 2023-01-09 – 2023-01-24 (×16): 81 mg via ORAL
  Filled 2023-01-08 (×16): qty 1

## 2023-01-08 MED ORDER — ONDANSETRON HCL 4 MG/2ML IJ SOLN: 4.0000 mg | Freq: Four times a day (QID) | INTRAMUSCULAR | Status: DC | PRN

## 2023-01-08 MED ORDER — ACETAMINOPHEN 325 MG PO TABS
650.0000 mg | ORAL_TABLET | Freq: Four times a day (QID) | ORAL | Status: DC | PRN
  Administered 2023-01-11 – 2023-01-24 (×4): 650 mg via ORAL
  Filled 2023-01-08 (×4): qty 2

## 2023-01-08 MED ORDER — SPIRONOLACTONE 12.5 MG HALF TABLET
12.5000 mg | ORAL_TABLET | Freq: Every day | ORAL | Status: DC
  Administered 2023-01-09 – 2023-01-24 (×16): 12.5 mg via ORAL
  Filled 2023-01-08 (×16): qty 1

## 2023-01-08 MED ORDER — FUROSEMIDE 10 MG/ML IJ SOLN
40.0000 mg | Freq: Once | INTRAMUSCULAR | Status: AC
  Administered 2023-01-08: 40 mg via INTRAVENOUS
  Filled 2023-01-08: qty 4

## 2023-01-08 MED ORDER — POLYETHYLENE GLYCOL 3350 17 G PO PACK
17.0000 g | PACK | Freq: Every day | ORAL | Status: DC | PRN
  Administered 2023-01-11 – 2023-01-14 (×2): 17 g via ORAL
  Filled 2023-01-08 (×2): qty 1

## 2023-01-08 MED ORDER — IPRATROPIUM-ALBUTEROL 0.5-2.5 (3) MG/3ML IN SOLN
3.0000 mL | Freq: Four times a day (QID) | RESPIRATORY_TRACT | Status: DC
  Administered 2023-01-08 – 2023-01-09 (×3): 3 mL via RESPIRATORY_TRACT
  Filled 2023-01-08 (×3): qty 3

## 2023-01-08 MED ORDER — SODIUM CHLORIDE 0.9 % IV SOLN
1.0000 g | Freq: Once | INTRAVENOUS | Status: AC
  Administered 2023-01-08: 1 g via INTRAVENOUS
  Filled 2023-01-08: qty 10

## 2023-01-08 MED ORDER — OLANZAPINE 2.5 MG PO TABS
2.5000 mg | ORAL_TABLET | Freq: Two times a day (BID) | ORAL | Status: DC
  Administered 2023-01-09 – 2023-01-24 (×31): 2.5 mg via ORAL
  Filled 2023-01-08: qty 0.5
  Filled 2023-01-08 (×3): qty 1
  Filled 2023-01-08: qty 0.5
  Filled 2023-01-08 (×8): qty 1
  Filled 2023-01-08: qty 0.5
  Filled 2023-01-08 (×19): qty 1

## 2023-01-08 MED ORDER — SODIUM CHLORIDE 0.9 % IV SOLN
2.0000 g | INTRAVENOUS | Status: DC
  Administered 2023-01-09: 2 g via INTRAVENOUS
  Filled 2023-01-08: qty 20

## 2023-01-08 MED ORDER — IPRATROPIUM-ALBUTEROL 0.5-2.5 (3) MG/3ML IN SOLN
3.0000 mL | Freq: Once | RESPIRATORY_TRACT | Status: AC
  Administered 2023-01-08: 3 mL via RESPIRATORY_TRACT
  Filled 2023-01-08: qty 3

## 2023-01-08 MED ORDER — ACETAMINOPHEN 650 MG RE SUPP: 650.0000 mg | Freq: Four times a day (QID) | RECTAL | Status: DC | PRN

## 2023-01-08 MED ORDER — SODIUM CHLORIDE 1 G PO TABS
1.0000 g | ORAL_TABLET | Freq: Two times a day (BID) | ORAL | Status: DC
  Administered 2023-01-09 – 2023-01-15 (×12): 1 g via ORAL
  Filled 2023-01-08 (×12): qty 1

## 2023-01-08 MED ORDER — DULOXETINE HCL 30 MG PO CPEP
60.0000 mg | ORAL_CAPSULE | Freq: Every day | ORAL | Status: DC
  Administered 2023-01-09 – 2023-01-24 (×16): 60 mg via ORAL
  Filled 2023-01-08 (×15): qty 2
  Filled 2023-01-08: qty 1
  Filled 2023-01-08: qty 2

## 2023-01-08 MED ORDER — IPRATROPIUM-ALBUTEROL 0.5-2.5 (3) MG/3ML IN SOLN
3.0000 mL | Freq: Once | RESPIRATORY_TRACT | Status: AC
  Administered 2023-01-08: 3 mL via RESPIRATORY_TRACT

## 2023-01-08 MED ORDER — SODIUM CHLORIDE 0.9% FLUSH
3.0000 mL | Freq: Two times a day (BID) | INTRAVENOUS | Status: DC
  Administered 2023-01-08 – 2023-01-14 (×12): 3 mL via INTRAVENOUS

## 2023-01-08 MED ORDER — RIVASTIGMINE TARTRATE 1.5 MG PO CAPS
1.5000 mg | ORAL_CAPSULE | Freq: Two times a day (BID) | ORAL | Status: DC
  Administered 2023-01-09 – 2023-01-24 (×30): 1.5 mg via ORAL
  Filled 2023-01-08 (×35): qty 1

## 2023-01-08 MED ORDER — BISOPROLOL FUMARATE 5 MG PO TABS
5.0000 mg | ORAL_TABLET | Freq: Every day | ORAL | Status: DC
  Administered 2023-01-09 – 2023-01-24 (×16): 5 mg via ORAL
  Filled 2023-01-08 (×16): qty 1

## 2023-01-08 MED ORDER — FOLIC ACID 1 MG PO TABS
1.0000 mg | ORAL_TABLET | Freq: Every day | ORAL | Status: DC
Start: 2023-01-08 — End: 2023-01-08

## 2023-01-08 MED ORDER — OXCARBAZEPINE 150 MG PO TABS
150.0000 mg | ORAL_TABLET | Freq: Every morning | ORAL | Status: DC
  Administered 2023-01-10 – 2023-01-24 (×15): 150 mg via ORAL
  Filled 2023-01-08 (×16): qty 1

## 2023-01-08 MED ORDER — METHYLPREDNISOLONE SODIUM SUCC 125 MG IJ SOLR
125.0000 mg | Freq: Once | INTRAMUSCULAR | Status: AC
  Administered 2023-01-08: 125 mg via INTRAVENOUS
  Filled 2023-01-08: qty 2

## 2023-01-08 MED ORDER — HYDRALAZINE HCL 20 MG/ML IJ SOLN
10.0000 mg | Freq: Once | INTRAMUSCULAR | Status: AC
  Administered 2023-01-08: 10 mg via INTRAVENOUS
  Filled 2023-01-08: qty 1

## 2023-01-08 NOTE — ED Triage Notes (Addendum)
Arrives via [facility] from [employer].  C/O AMS x 2 days and HTN.  PMNH of HTN.  Patient c/oleft shoulder pain.  Hx dementia.  New over past 2 days patient not walking around as much and mumbles more with speech.  EMS reports strong smelling urine.  Nursing staff from [facility] reports an aprasion to left forehead that patient had from shaving area.  No Falls.  CBG:  [phone]/90

## 2023-01-08 NOTE — ED Notes (Signed)
Called lab to get labs added on urine.

## 2023-01-08 NOTE — Assessment & Plan Note (Signed)
Likely in the setting of dementia and physical deconditioning.  - PT/OT

## 2023-01-08 NOTE — ED Notes (Signed)
77% on RA, placed on 3 L [place], no improvement. Placed on NRB. Darl Pikes PA in room

## 2023-01-08 NOTE — Assessment & Plan Note (Addendum)
Likely in the setting of a COPD exacerbation and potentially underlying pneumonia.  Check cardiac markers as well.  On reassessment, patient's tachypnea was noted to be progressively worsening with rates in the 40s, so patient was placed on BiPAP and tolerating well at this time.   - Continue BiPAP for increased work of breathing - Wean as tolerated - BNP and troponin pending - Management of COPD and CAP as noted below

## 2023-01-08 NOTE — Assessment & Plan Note (Addendum)
Patient is significantly hypertensive at this time.   - Resume home regimen - Hydralazine 5 mg every 6 hours as needed for SBP over 170, if no improvement, increase dose to 10 mg

## 2023-01-08 NOTE — H&P (Signed)
History and Physical    Patient: Samantha Carpenter HQI:[id number] DOB: 23-Jul-1940 DOA: 01/08/2023 DOS: the patient was seen and examined on 01/08/2023 PCP: Pcp, No  Patient coming from: Home  Chief Complaint:  Chief Complaint  Patient presents with   Fall   HPI: Samantha Carpenter is a 82 y.o. female with medical history significant of dementia complicated by frequent falls, COPD, chronic diastolic heart failure, CKD stage III A, hypertension, hyperlipidemia who presents to the ED due to a altered mental status.  History is limited by patient's underlying dementia.  She states that she came in today due to a fall but cannot recall the events surrounding the fall or how it happened.  She also endorses shortness of breath with cough and states has been happening for a while now.  When asking any other review of system questions, patient replies yes to each 1 including chest pain, nausea, vomiting, diarrhea, abdominal pain etc.  ED course: On arrival to the ED, patient was hypertensive at 152/102 with increased to as high as 212/69.  She is saturating at 77% on room air and placed on 3 L with minimal improvement, subsequently requiring a nonrebreather.  She has now been weaned to 2 L with stable oxygen saturation at 99%.  Afebrile at 97.7.  Initial workup notable for WBC of 10.6, BUN of 25, creatinine 1.19 with GFR 45.  Lactic acid 1.3.  COVID-19, influenza and RSV PCR negative.  UA with no infectious signs.   Chest x-ray was obtained with concern for patchy left retrocardiac opacity that may be infectious in etiology in addition to possible small left pleural effusion versus atelectasis and potential distal left clavicle fracture.  CT head and CT of the C-spine were obtained and still pending.  Patient started on vancomycin, Rocephin and DuoNebs.  TRH contacted for admission.  Review of Systems: unable to review all systems due to the inability of the patient to answer questions.  Past Medical History:   Diagnosis Date   Chronic kidney disease    COPD (chronic obstructive pulmonary disease) (HCC)    Dementia (HCC)    Depression    Hypertension    Liver disease    Past Surgical History:  Procedure Laterality Date   ABDOMINAL HYSTERECTOMY     CHOLECYSTECTOMY     ESOPHAGOGASTRODUODENOSCOPY N/A 11/20/2019   Procedure: ESOPHAGOGASTRODUODENOSCOPY (EGD);  Surgeon: Regis Bill, MD;  Location: [facility] ENDOSCOPY;  Service: Gastroenterology;  Laterality: N/A;   face tuck     Social History:  reports that she quit smoking about 13 years ago. She has never used smokeless tobacco. She reports that she does not currently use alcohol. She reports that she does not currently use drugs.  Allergies  Allergen Reactions   Morphine And Codeine    Latex Rash    "I break out"    Family History  Problem Relation Age of Onset   Bipolar disorder Brother    Schizophrenia Brother    Bipolar disorder Sister    Schizophrenia Sister    Drug abuse Son     Prior to Admission medications   Medication Sig Start Date End Date Taking? Authorizing Provider  Acetaminophen Extra Strength 500 MG TABS Take 500 mg by mouth every 4 (four) hours as needed (pain). 10/31/21  Yes [provider]  albuterol (VENTOLIN HFA) 108 (90 Base) MCG/ACT inhaler Inhale 2 puffs into the lungs every 6 (six) hours as needed for wheezing or shortness of breath. 05/05/20  Yes [provider]  aspirin EC 81 MG tablet Take 81 mg by mouth daily.   Yes [provider]  bisoprolol (ZEBETA) 5 MG tablet Take 1 tablet (5 mg total) by mouth daily. 11/17/21  Yes Wieting, Richard, MD  clonazePAM (KLONOPIN) 0.5 MG tablet Take 0.5 mg by mouth 3 (three) times daily. 11/05/22  Yes [provider]  DULoxetine (CYMBALTA) 60 MG capsule Take 60 mg by mouth daily. 08/25/18  Yes [provider]  famotidine (PEPCID) 20 MG tablet Take 1 tablet (20 mg total) by mouth daily. 08/07/22 08/07/23 Yes Phineas Semen, MD   fluticasone-salmeterol (ADVAIR) 250-50 MCG/ACT AEPB Inhale 1 puff into the lungs in the morning and at bedtime. 04/03/22  Yes Dgayli, Lianne Bushy, MD  furosemide (LASIX) 40 MG tablet Take 0.5 tablets (20 mg total) by mouth daily. 11/08/22  Yes Sunnie Nielsen, DO  gabapentin (NEURONTIN) 100 MG capsule Take 100 mg by mouth 2 (two) times daily. 06/18/18  Yes [provider]  haloperidol (HALDOL) 2 MG tablet Take 1 tablet (2 mg total) by mouth every 6 (six) hours as needed for agitation. 11/16/21  Yes Wieting, Richard, MD  ipratropium-albuterol (DUONEB) 0.5-2.5 (3) MG/3ML SOLN Take 3 mLs by nebulization in the morning, at noon, and at bedtime. 12/28/21  Yes [provider]  losartan (COZAAR) 100 MG tablet Take 0.5 tablets (50 mg total) by mouth daily. Patient taking differently: Take 100 mg by mouth daily. 11/08/22  Yes Sunnie Nielsen, DO  MAGNESIUM OXIDE 400 PO Take 1 tablet by mouth 2 (two) times daily as needed.   Yes [provider]  OLANZapine (ZYPREXA) 5 MG tablet Take 2.5 mg by mouth 2 (two) times daily. At 1200 and 1800. 11/01/22  Yes [provider]  OXcarbazepine (TRILEPTAL) 150 MG tablet Take 150 mg by mouth in the morning. 06/12/18  Yes [provider]  OXcarbazepine (TRILEPTAL) 150 MG tablet Take 300 mg by mouth at bedtime.   Yes [provider]  polyethylene glycol (MIRALAX / GLYCOLAX) 17 g packet Take 17 g by mouth daily. 10/25/21  Yes [provider]  rivastigmine (EXELON) 1.5 MG capsule Take 1.5 mg by mouth 2 (two) times daily. 03/28/20 01/08/23 Yes [provider]  senna-docusate (SENNA PLUS) 8.6-50 MG tablet Take 1 tablet by mouth at bedtime.   Yes [provider]  sodium chloride 1 g tablet Take 1 tablet (1 g total) by mouth 2 (two) times daily with a meal. 04/23/22  Yes Leeroy Bock, MD  spironolactone (ALDACTONE) 25 MG tablet Take 0.5 tablets (12.5 mg total) by mouth daily. 11/08/22  Yes Sunnie Nielsen, DO  Tiotropium Bromide Monohydrate (SPIRIVA RESPIMAT) 2.5 MCG/ACT AERS Inhale 2 puffs into the lungs daily. 04/03/22  Yes Dgayli, Lianne Bushy, MD  QUEtiapine (SEROQUEL) 50 MG tablet Take 1 tablet (50 mg total) by mouth at bedtime. 02/19/22 10/14/22  Delfino Lovett, MD    Physical Exam: Vitals:   01/08/23 1715 01/08/23 1845 01/08/23 1850 01/08/23 1854  BP: (!) 248/87 (!) 189/62 (!) 189/62   Pulse: 85 83    Resp: (!) 28 (!) 36    Temp:    97.8 F (36.6 C)  TempSrc:    Axillary  SpO2: 97% 100%    Weight:      Height:       Physical Exam Vitals and nursing note reviewed.  Constitutional:      Comments: Sleepy but awakens easily  HENT:     Head: Normocephalic.     Comments: Abrasion along the  left eyebrow, healing    Mouth/Throat:     Mouth: Mucous membranes are dry.     Pharynx: Oropharynx is clear.  Eyes:     Conjunctiva/sclera: Conjunctivae normal.     Pupils: Pupils are equal, round, and reactive to light.  Cardiovascular:     Rate and Rhythm: Normal rate and regular rhythm.     Heart sounds: No murmur heard. Pulmonary:     Effort: Tachypnea, accessory muscle usage and respiratory distress present.     Breath sounds: Wheezing and rhonchi present. No decreased breath sounds or rales.  Abdominal:     General: Bowel sounds are normal. There is no distension.     Palpations: Abdomen is soft.     Tenderness: There is no abdominal tenderness. There is no guarding.  Musculoskeletal:     Right lower leg: No edema.     Left lower leg: No edema.  Skin:    General: Skin is warm and dry.  Neurological:     Comments:  Patient is alert and oriented to person, place only.  Diffuse weakness throughout with no focal weakness noted  Psychiatric:        Attention and Perception: She perceives visual (Patient asking if there is chocolate in the TV (TV was off)) hallucinations.        Cognition and Memory: Cognition is impaired. Memory is impaired.    Data Reviewed: CBC with WBC 10.6,  hemoglobin of 12.4, MCV of 100.3, platelets of 305 CMP with sodium of 139, potassium 4.0, bicarb 30, glucose 158, BUN 25, creatinine 1.19, AST 19, ALT 17 and GFR 45 Lactic acid 1.3 COVID #19, influenza and RSV PCR negative Urinalysis with proteinuria only  EKG personally reviewed.  Sinus rhythm with a rate of 66.  No ST or T wave changes concerning for acute ischemia.  CT Head Wo Contrast  Result Date: 01/08/2023 CLINICAL DATA:  fall EXAM: CT HEAD WITHOUT CONTRAST CT CERVICAL SPINE WITHOUT CONTRAST TECHNIQUE: Multidetector CT imaging of the head and cervical spine was performed following the standard protocol without intravenous contrast. Multiplanar CT image reconstructions of the cervical spine were also generated. RADIATION DOSE REDUCTION: This exam was performed according to the departmental dose-optimization program which includes automated exposure control, adjustment of the mA and/or kV according to patient size and/or use of iterative reconstruction technique. COMPARISON:  CT head and C Spine 10/31/22 FINDINGS: CT HEAD FINDINGS Brain: No hemorrhage. No hydrocephalus. No extra-axial fluid collection. No CT evidence of an acute cortical infarct. No mass effect. No mass lesion. Vascular: No hyperdense vessel or unexpected calcification. Skull: Normal. Negative for fracture or focal lesion. Sinuses/Orbits: Effusion. Trace right mastoid effusion. Bilateral lens replacement. Orbits are otherwise unremarkable. Paranasal sinuses clear. Other: None. CT CERVICAL SPINE FINDINGS Limitations: Motion degraded exam Alignment: Grade 1 anterolisthesis of C3 on C4. Mild retrolisthesis of C4 on C5 and C5 on C6. Skull base and vertebrae: No acute fracture. No primary bone lesion or focal pathologic process. Soft tissues and spinal canal: No prevertebral fluid or swelling. No visible canal hematoma. Disc levels:  Mild spinal canal narrowing at C4-C5 Upper chest: Biapical pleural-parenchymal scarring. Other: None  IMPRESSION: 1. No acute intracranial abnormality. 2. No acute fracture or traumatic malalignment of the cervical spine. 3. Mild spinal canal narrowing at C4-C5. Electronically Signed   By: Lorenza Cambridge M.D.   On: 01/08/2023 17:26   CT Cervical Spine Wo Contrast  Result Date: 01/08/2023 CLINICAL DATA:  fall EXAM: CT HEAD WITHOUT CONTRAST  CT CERVICAL SPINE WITHOUT CONTRAST TECHNIQUE: Multidetector CT imaging of the head and cervical spine was performed following the standard protocol without intravenous contrast. Multiplanar CT image reconstructions of the cervical spine were also generated. RADIATION DOSE REDUCTION: This exam was performed according to the departmental dose-optimization program which includes automated exposure control, adjustment of the mA and/or kV according to patient size and/or use of iterative reconstruction technique. COMPARISON:  CT head and C Spine 10/31/22 FINDINGS: CT HEAD FINDINGS Brain: No hemorrhage. No hydrocephalus. No extra-axial fluid collection. No CT evidence of an acute cortical infarct. No mass effect. No mass lesion. Vascular: No hyperdense vessel or unexpected calcification. Skull: Normal. Negative for fracture or focal lesion. Sinuses/Orbits: Effusion. Trace right mastoid effusion. Bilateral lens replacement. Orbits are otherwise unremarkable. Paranasal sinuses clear. Other: None. CT CERVICAL SPINE FINDINGS Limitations: Motion degraded exam Alignment: Grade 1 anterolisthesis of C3 on C4. Mild retrolisthesis of C4 on C5 and C5 on C6. Skull base and vertebrae: No acute fracture. No primary bone lesion or focal pathologic process. Soft tissues and spinal canal: No prevertebral fluid or swelling. No visible canal hematoma. Disc levels:  Mild spinal canal narrowing at C4-C5 Upper chest: Biapical pleural-parenchymal scarring. Other: None IMPRESSION: 1. No acute intracranial abnormality. 2. No acute fracture or traumatic malalignment of the cervical spine. 3. Mild spinal canal  narrowing at C4-C5. Electronically Signed   By: Lorenza Cambridge M.D.   On: 01/08/2023 17:26   DG Chest Port 1 View  Result Date: 01/08/2023 CLINICAL DATA:  Concern for sepsis.  Hypoxia. EXAM: PORTABLE CHEST 1 VIEW COMPARISON:  Chest radiograph dated November 06, 2022. FINDINGS: Patient is rotated to the right. The heart size and mediastinal contours are within normal limits. Aortic calcification. Patchy left retrocardiac opacity. There is blunting of the left costophrenic angle, which could represent a small effusion or atelectasis. No pneumothorax. Cortical irregularity of the distal left clavicle, appears new compared to the prior exam dated November 06, 2022. Remote healed left anterior rib fractures. IMPRESSION: 1. Patchy left retrocardiac opacity may be secondary to an infectious/inflammatory etiology. 2. Possible small left pleural effusion versus atelectasis. 3. Cortical irregularity of the distal left clavicle appears new compared to the prior exam dated October 29, 2022. A fracture can not be excluded. Recommend correlation with history and physical exam. Consider dedicated radiographs of the left clavicle for further evaluation. Electronically Signed   By: Hart Robinsons M.D.   On: 01/08/2023 15:28    Results are pending, will review when available.  Assessment and Plan:  * Acute hypoxic respiratory failure (HCC) Likely in the setting of a COPD exacerbation and potentially underlying pneumonia.  Check cardiac markers as well.  On reassessment, patient's tachypnea was noted to be progressively worsening with rates in the 40s, so patient was placed on BiPAP and tolerating well at this time.   - Continue BiPAP for increased work of breathing - Wean as tolerated - BNP and troponin pending - Management of COPD and CAP as noted below  COPD with acute exacerbation (HCC) Given increased work of breathing, shortness of breath and hypoxia, will treat as a COPD exacerbation.  - Start prednisone 40 mg  today to complete a 5-day course - DuoNebs every 6 hours - Hold home bronchodilators  CAP (community acquired pneumonia) On chest x-ray, there is a possible retrocardiac opacity and given elevated WBC and reported shortness of breath, will treat for community-acquired pneumonia.  - Continue Ceftriaxone and start azithromycin - Discontinue vancomycin - RVP  and procalcitonin pending - Legionella and strep pneumo urinary antigens  Chronic diastolic CHF (congestive heart failure) (HCC) Patient appears euvolemic at this time.  - Resume home regimen tomorrow  Dementia with behavioral disturbance [facility]) Patient has been increasingly altered per SNF over the last couple days, likely due to underlying infection and respiratory symptoms.  At this time, she is alert and oriented to person and place and occasionally answers questions appropriately but is actively hallucinating and responds yes to every review of systems question  - Continue home regimen - Delirium precautions  CKD stage 3a, GFR 45-59 ml/min (HCC) Per chart review, patient has a history of CKD stage IIIa.  Her creatinine seems to fluctuate quite a bit between 0.8 and 1.2, currently 1.19.  - Continue to monitor renal indices while admitted - Avoid nephrotoxic agents as able  HTN, goal below 140/80 Patient is significantly hypertensive at this time.   - Resume home regimen - Hydralazine 5 mg every 6 hours as needed for SBP over 170, if no improvement, increase dose to 10 mg  Prediabetes Last A1c of 6.1% approximately 1 year ago.  - A1c pending  Falls frequently Likely in the setting of dementia and physical deconditioning.  - PT/OT  Advance Care Planning:   Code Status: Limited: Do not attempt resuscitation (DNR) -DNR-LIMITED -Do Not Intubate/DNI Gold form present at bedside  Consults: None  Family Communication: Patient's sister updated via telephone  Severity of Illness: The appropriate patient status for this  patient is OBSERVATION. Observation status is judged to be reasonable and necessary in order to provide the required intensity of service to ensure the patient's safety. The patient's presenting symptoms, physical exam findings, and initial radiographic and laboratory data in the context of their medical condition is felt to place them at decreased risk for further clinical deterioration. Furthermore, it is anticipated that the patient will be medically stable for discharge from the hospital within 2 midnights of admission.   Author: Verdene Lennert, MD 01/08/2023 7:05 PM  For on call review [email or web address].

## 2023-01-08 NOTE — Assessment & Plan Note (Signed)
On chest x-ray, there is a possible retrocardiac opacity and given elevated WBC and reported shortness of breath, will treat for community-acquired pneumonia.  - Continue Ceftriaxone and start azithromycin - Discontinue vancomycin - RVP and procalcitonin pending - Legionella and strep pneumo urinary antigens

## 2023-01-08 NOTE — ED Notes (Signed)
Attending at bedside. Spo2 monitor adjusted

## 2023-01-08 NOTE — Progress Notes (Signed)
Pt transported to 247 on Bipap without incident. Pt remains on Bipap and is tol well at this time. Report given to unit RT.

## 2023-01-08 NOTE — ED Notes (Signed)
RT Contacted to place patient on Bipap

## 2023-01-08 NOTE — Assessment & Plan Note (Addendum)
Given increased work of breathing, shortness of breath and hypoxia, will treat as a COPD exacerbation.  - Start prednisone 40 mg today to complete a 5-day course - DuoNebs every 6 hours - Hold home bronchodilators

## 2023-01-08 NOTE — Assessment & Plan Note (Signed)
Last A1c of 6.1% approximately 1 year ago.  - A1c pending

## 2023-01-08 NOTE — Assessment & Plan Note (Signed)
Per chart review, patient has a history of CKD stage IIIa.  Her creatinine seems to fluctuate quite a bit between 0.8 and 1.2, currently 1.19.  - Continue to monitor renal indices while admitted - Avoid nephrotoxic agents as able

## 2023-01-08 NOTE — Assessment & Plan Note (Addendum)
Patient has been increasingly altered per SNF over the last couple days, likely due to underlying infection and respiratory symptoms.  At this time, she is alert and oriented to person and place and occasionally answers questions appropriately but is actively hallucinating and responds yes to every review of systems question  - Continue home regimen - Delirium precautions

## 2023-01-08 NOTE — ED Provider Notes (Signed)
[facility] Provider Note    Event Date/Time   First MD Initiated Contact with Patient 01/08/23 1235     (approximate)   History   Fall   HPI  Lauraetta Orey is a 82 y.o. female who presents to the emergency department today from living facility after a fall and weakness.  The patient herself is somewhat somnolent and cannot give a great history.  She states that she has been feeling somewhat short of breath and she does admit to having a fall although denies any acute pain from it.  Per chart review patient had a hospitalization a couple months ago for COPD.     Physical Exam   Triage Vital Signs: ED Triage Vitals  Encounter Vitals Group     BP 01/08/23 1219 (!) 220/76     Systolic BP Percentile --      Diastolic BP Percentile --      Pulse Rate 01/08/23 1219 66     Resp 01/08/23 1219 (!) 28     Temp 01/08/23 1219 97.7 F (36.5 C)     Temp src --      SpO2 01/08/23 1219 (!) 77 %     Weight 01/08/23 1223 150 lb (68 kg)     Height 01/08/23 1223 5\' 4"  (1.626 m)     Head Circumference --      Peak Flow --      Pain Score --      Pain Loc --      Pain Education --      Exclude from Growth Chart --     Most recent vital signs: Vitals:   01/08/23 1245 01/08/23 1300  BP: (!) 152/102   Pulse: 70 65  Resp: (!) 26 (!) 30  Temp:    SpO2: 95% 100%   General: Somnolent, awakens to verbal stimuli, able to give yes no answers CV:  Good peripheral perfusion. Regular rate Resp:  Tachypnea. Slightly increased work of breathing. Poor air movement diffusely. Abd:  No distention.   ED Results / Procedures / Treatments   Labs (all labs ordered are listed, but only abnormal results are displayed) Labs Reviewed  CBC WITH DIFFERENTIAL/PLATELET - Abnormal; Notable for the following components:      Result Value   WBC 10.6 (*)    MCV 100.3 (*)    Neutro Abs 8.4 (*)    Monocytes Absolute 1.2 (*)    All other components within normal limits  CULTURE,  BLOOD (ROUTINE X 2)  CULTURE, BLOOD (ROUTINE X 2)  COMPREHENSIVE METABOLIC PANEL  LACTIC ACID, PLASMA  LACTIC ACID, PLASMA  PROTIME-INR  URINALYSIS, W/ REFLEX TO CULTURE (INFECTION SUSPECTED)     EKG  I, Phineas Semen, attending physician, personally viewed and interpreted this EKG  EKG Time: 1237 Rate: 66 Rhythm: sinus rhythm Axis: normal Intervals: qtc 429 QRS: narrow ST changes: no st elevation Impression: normal ekg    RADIOLOGY I independently interpreted and visualized the CXR. My interpretation: Bilateral opacities Radiology interpretation:  IMPRESSION:  1. Patchy left retrocardiac opacity may be secondary to an  infectious/inflammatory etiology.  2. Possible small left pleural effusion versus atelectasis.  3. Cortical irregularity of the distal left clavicle appears new  compared to the prior exam dated October 29, 2022. A fracture can not  be excluded. Recommend correlation with history and physical exam.  Consider dedicated radiographs of the left clavicle for further  evaluation.     PROCEDURES:  Critical Care  performed: No   MEDICATIONS ORDERED IN ED: Medications - No data to display   IMPRESSION / MDM / ASSESSMENT AND PLAN / ED COURSE  I reviewed the triage vital signs and the nursing notes.                              Differential diagnosis includes, but is not limited to, UTI, pneumonia, chf  Patient's presentation is most consistent with acute presentation with potential threat to life or bodily function.   The patient is on the cardiac monitor to evaluate for evidence of arrhythmia and/or significant heart rate changes.  Patient presented to the emergency department today because of concerns for a fall and weakness.  Patient was found to be hypoxic here in the emergency department.  Blood work without significant anemia or lactic acidosis.  Very slight leukocytosis.  Chest x-ray did show findings concerning for pneumonia.  Patient with  very mild tenderness over the area of concern.  At this time if it does represent acute fracture over the clavicle do not feel any specific intervention is necessary.  Will start broad-spectrum antibiotics.  Patient will require admission to the hospital service.      FINAL CLINICAL IMPRESSION(S) / ED DIAGNOSES   Final diagnoses:  Pneumonia due to infectious organism, unspecified laterality, unspecified part of lung      Note:  This document was prepared using Dragon voice recognition software and may include unintentional dictation errors.    Phineas Semen, MD 01/08/23 [phone]

## 2023-01-08 NOTE — Assessment & Plan Note (Signed)
Patient appears euvolemic at this time.  - Resume home regimen tomorrow

## 2023-01-09 DIAGNOSIS — J9601 Acute respiratory failure with hypoxia: Secondary | ICD-10-CM | POA: Diagnosis not present

## 2023-01-09 LAB — CBC
HCT: 34.6 % — ABNORMAL LOW (ref 36.0–46.0)
Hemoglobin: 11.1 g/dL — ABNORMAL LOW (ref 12.0–15.0)
MCH: 31.6 pg (ref 26.0–34.0)
MCHC: 32.1 g/dL (ref 30.0–36.0)
MCV: 98.6 fL (ref 80.0–100.0)
Platelets: 283 10*3/uL (ref 150–400)
RBC: 3.51 MIL/uL — ABNORMAL LOW (ref 3.87–5.11)
RDW: 13.2 % (ref 11.5–15.5)
WBC: 7.5 10*3/uL (ref 4.0–10.5)
nRBC: 0 % (ref 0.0–0.2)

## 2023-01-09 LAB — RESPIRATORY PANEL BY PCR

## 2023-01-09 LAB — BASIC METABOLIC PANEL
Anion gap: 15 (ref 5–15)
BUN: 30 mg/dL — ABNORMAL HIGH (ref 8–23)
CO2: 27 mmol/L (ref 22–32)
Calcium: 8.9 mg/dL (ref 8.9–10.3)
Chloride: 99 mmol/L (ref 98–111)
Creatinine, Ser: 1.43 mg/dL — ABNORMAL HIGH (ref 0.44–1.00)
GFR, Estimated: 36 mL/min — ABNORMAL LOW (ref >60)
Glucose, Bld: 222 mg/dL — ABNORMAL HIGH (ref 70–99)
Potassium: 4.1 mmol/L (ref 3.5–5.1)
Sodium: 141 mmol/L (ref 135–145)

## 2023-01-09 LAB — HEMOGLOBIN A1C
Hgb A1c MFr Bld: 5.8 % — ABNORMAL HIGH (ref 4.8–5.6)
Mean Plasma Glucose: 119.76 mg/dL

## 2023-01-09 MED ORDER — HALOPERIDOL LACTATE 5 MG/ML IJ SOLN
1.0000 mg | Freq: Four times a day (QID) | INTRAMUSCULAR | Status: DC | PRN
  Administered 2023-01-09 – 2023-01-10 (×4): 2 mg via INTRAMUSCULAR
  Administered 2023-01-11: 1 mg via INTRAMUSCULAR
  Administered 2023-01-11: 2 mg via INTRAMUSCULAR
  Administered 2023-01-11: 1 mg via INTRAMUSCULAR
  Filled 2023-01-09 (×8): qty 1

## 2023-01-09 MED ORDER — IPRATROPIUM-ALBUTEROL 0.5-2.5 (3) MG/3ML IN SOLN
3.0000 mL | Freq: Two times a day (BID) | RESPIRATORY_TRACT | Status: DC
  Administered 2023-01-09 – 2023-01-10 (×2): 3 mL via RESPIRATORY_TRACT
  Filled 2023-01-09 (×2): qty 3

## 2023-01-09 MED ORDER — CLONAZEPAM 0.25 MG PO TBDP
0.2500 mg | ORAL_TABLET | Freq: Three times a day (TID) | ORAL | Status: DC
  Administered 2023-01-09 – 2023-01-15 (×17): 0.25 mg via ORAL
  Filled 2023-01-09 (×17): qty 1

## 2023-01-09 MED ORDER — SODIUM CHLORIDE 0.9 % IV SOLN
2.0000 g | INTRAVENOUS | Status: AC
  Administered 2023-01-10 – 2023-01-11 (×2): 2 g via INTRAVENOUS
  Filled 2023-01-09 (×3): qty 20

## 2023-01-09 NOTE — [place]2 (Signed)
[place] MEDICAID FL2 LEVEL OF CARE FORM     IDENTIFICATION  Patient Name: Jaz Markwell Birthdate: 05-Jul-1940 Sex: female Admission Date (Current Location): 01/08/2023  [facility] and [place] Number:  Chiropodist and Address:  [facility], [address] [place], [place] [address]      Provider Number: [id number]  Attending Physician Name and Address:  Darlin Priestly, MD  Relative Name and Phone Number:  [name] (sister)  [phone]    Current Level of Care: Hospital Recommended Level of Care: Skilled Nursing Facility Prior Approval Number:    Date Approved/Denied:   PASRR Number: [id number]  Discharge Plan: SNF    Current Diagnoses: Patient Active Problem List   Diagnosis Date Noted   Acute hypoxic respiratory failure (HCC) 01/08/2023   CAP (community acquired pneumonia) 01/08/2023   Acute hyponatremia 04/23/2022   Chronic diastolic CHF (congestive heart failure) (HCC) 04/16/2022   Hyponatremia 04/16/2022   Hypothermia 04/16/2022   Hypomagnesemia 02/15/2022   RSV (respiratory syncytial virus pneumonia) 02/15/2022   COPD with acute exacerbation (HCC) 02/10/2022   Hypokalemia 02/10/2022   Falls frequently 02/10/2022   Hypoglycemia 01/11/2022   AKI (acute kidney injury) (HCC) 01/10/2022   Pressure injury of skin 01/09/2022   COPD exacerbation (HCC) 01/08/2022   Prediabetes 01/08/2022   Dementia with behavioral disturbance (HCC) 11/15/2021   Hypoxia 11/15/2021   COVID-19 virus infection 11/14/2021   Right rib fracture 11/09/2020   Fall at home, initial encounter 11/09/2020   COPD (chronic obstructive pulmonary disease) (HCC) 11/09/2020   Depression with anxiety 11/09/2020   Elevated troponin 11/09/2020   Acute respiratory failure with hypoxia (HCC) 11/09/2020   HTN (hypertension) 11/09/2020   Abrasions of multiple sites    Pure hypercholesterolemia 06/09/2018   Recurrent major depressive disorder, in partial remission (HCC)  04/28/2018   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   HTN, goal below 140/80 04/28/2018   CKD stage 3a, GFR 45-59 ml/min (HCC) 04/28/2018    Orientation RESPIRATION BLADDER Height & Weight      (disoriented x4)  O2 (2L nasal cannula) Incontinent Weight: 150 lb (68 kg) Height:  5\' 4"  (162.6 cm)  BEHAVIORAL SYMPTOMS/MOOD NEUROLOGICAL BOWEL NUTRITION STATUS      Continent Diet  AMBULATORY STATUS COMMUNICATION OF NEEDS Skin   Extensive Assist Verbally Normal                       Personal Care Assistance Level of Assistance  Bathing, Feeding, Dressing, Total care Bathing Assistance: Maximum assistance Feeding assistance: Limited assistance Dressing Assistance: Maximum assistance Total Care Assistance: Maximum assistance   Functional Limitations Info  Sight, Hearing, Speech Sight Info: Adequate Hearing Info: Adequate Speech Info: Adequate    SPECIAL CARE FACTORS FREQUENCY  PT (By licensed PT), OT (By licensed OT)     PT Frequency: min 4x weekly OT Frequency: min 4x weekly            Contractures      Additional Factors Info  Code Status, Allergies Code Status Info: DNR - limited Allergies Info: morphine and codeine, latex           Current Medications (01/09/2023):  This is the current hospital active medication list Current Facility-Administered Medications  Medication Dose Route Frequency Provider Last Rate Last Admin   acetaminophen (TYLENOL) tablet 650 mg  650 mg Oral Q6H PRN Verdene Lennert, MD       Or   acetaminophen (TYLENOL) suppository 650 mg  650 mg Rectal Q6H  PRN Verdene Lennert, MD       aspirin EC tablet 81 mg  81 mg Oral Daily Verdene Lennert, MD   81 mg at 01/09/23 1104   azithromycin (ZITHROMAX) 500 mg in sodium chloride 0.9 % 250 mL IVPB  500 mg Intravenous Q24H Verdene Lennert, MD   Stopped at 01/08/23 2000   bisoprolol (ZEBETA) tablet 5 mg  5 mg Oral Daily Verdene Lennert, MD   5 mg at 01/09/23 1103   cefTRIAXone (ROCEPHIN) 2 g in sodium  chloride 0.9 % 100 mL IVPB  2 g Intravenous Q24H Darlin Priestly, MD       clonazePAM Scarlette Calico) disintegrating tablet 0.25 mg  0.25 mg Oral TID Darlin Priestly, MD       DULoxetine (CYMBALTA) DR capsule 60 mg  60 mg Oral Daily Verdene Lennert, MD   60 mg at 01/09/23 1102   enoxaparin (LOVENOX) injection 40 mg  40 mg Subcutaneous Q24H Verdene Lennert, MD   40 mg at 01/08/23 1852   famotidine (PEPCID) tablet 20 mg  20 mg Oral Daily Verdene Lennert, MD   20 mg at 01/09/23 1104   gabapentin (NEURONTIN) capsule 100 mg  100 mg Oral BID Verdene Lennert, MD   100 mg at 01/09/23 1102   haloperidol lactate (HALDOL) injection 1-2 mg  1-2 mg Intramuscular Q6H PRN Mansy, Jan A, MD   2 mg at 01/09/23 0229   ipratropium-albuterol (DUONEB) 0.5-2.5 (3) MG/3ML nebulizer solution 3 mL  3 mL Nebulization BID Darlin Priestly, MD       losartan (COZAAR) tablet 100 mg  100 mg Oral Daily Verdene Lennert, MD   100 mg at 01/09/23 1104   OLANZapine (ZYPREXA) tablet 2.5 mg  2.5 mg Oral BID Verdene Lennert, MD   2.5 mg at 01/09/23 1102   ondansetron (ZOFRAN) tablet 4 mg  4 mg Oral Q6H PRN Verdene Lennert, MD       Or   ondansetron (ZOFRAN) injection 4 mg  4 mg Intravenous Q6H PRN Verdene Lennert, MD       OXcarbazepine (TRILEPTAL) tablet 150 mg  150 mg Oral q AM Verdene Lennert, MD       Oxcarbazepine (TRILEPTAL) tablet 300 mg  300 mg Oral QHS Verdene Lennert, MD       polyethylene glycol (MIRALAX / GLYCOLAX) packet 17 g  17 g Oral Daily PRN Verdene Lennert, MD       predniSONE (DELTASONE) tablet 40 mg  40 mg Oral Q breakfast Verdene Lennert, MD       rivastigmine (EXELON) capsule 1.5 mg  1.5 mg Oral BID Verdene Lennert, MD       sodium chloride flush (NS) 0.9 % injection 3 mL  3 mL Intravenous Q12H Verdene Lennert, MD   3 mL at 01/08/23 2209   sodium chloride tablet 1 g  1 g Oral BID WC Verdene Lennert, MD       spironolactone (ALDACTONE) tablet 12.5 mg  12.5 mg Oral Daily Verdene Lennert, MD   12.5 mg at 01/09/23 1105     Discharge  Medications: Please see discharge summary for a list of discharge medications.  Relevant Imaging Results:  Relevant Lab Results:   Additional Information SSN: [id number]  Darolyn Rua, LCSW

## 2023-01-09 NOTE — Progress Notes (Signed)
Tele sitter camera placed at bedside due to patient attempting to get out of bed multiple times.  Patient now on nasal cannula. Not following commands to remain in bed. Not oriented and not adhering to safety precautions.

## 2023-01-09 NOTE — Progress Notes (Signed)
Patient IV leaking, assessed bilateral arms. No IV access site noted. IV Team consult placed.    01/09/23 1054  Specimen Collection Status  Blood Specimen Collection Lab  Unsuccessful Nursing Procedure/Treatment  Type of Nursing Procedure/Treatment Peripheral IV insertion  Number of attempts 0  Location of attempt assessed bilateral upper arms

## 2023-01-09 NOTE — Progress Notes (Signed)
PROGRESS NOTE    Taneja Quattrochi  ZOX:[id number] DOB: January 17, 1941 DOA: 01/08/2023 PCP: Pcp, No  [id number]/[id number]-AA  LOS: 1 day   Brief hospital course:   Assessment & Plan: Samantha [place] is a 82 y.o. female with medical history significant of dementia complicated by frequent falls, COPD, chronic diastolic heart failure, CKD stage III A, hypertension, hyperlipidemia who presents to the ED due to a altered mental status.    * Acute hypoxic respiratory failure (HCC) Likely in the setting of a COPD exacerbation and potentially underlying pneumonia.  On reassessment, patient's tachypnea was noted to be progressively worsening with rates in the 40s, so patient was placed on BiPAP. --weaned off BiPAP and on 2L [place] this morning Plan: - Management of COPD and CAP as noted below --Continue supplemental O2 to keep sats >=90%, wean as tolerated  COPD with acute exacerbation (HCC) Given increased work of breathing, shortness of breath and hypoxia, will treat as a COPD exacerbation. - Started prednisone 40 mg to complete a 5-day course and scheduled DuoNebs  --cont prednisone --cont DuoNeb scheduled  CAP (community acquired pneumonia) On chest x-ray, there is a possible retrocardiac opacity and given elevated WBC and reported shortness of breath, started on tx for community-acquired pneumonia.  Procal 0.72. --cont ceftriaxone and azithro  Chronic diastolic CHF (congestive heart failure) (HCC) Patient appears euvolemic at this time.  Dementia with behavioral disturbance [facility]) Patient has been increasingly altered per SNF over the last couple days, likely due to underlying infection and respiratory symptoms.   --of note, pt has many sedative medications on home med list Plan: - consider tapering home sedatives - Delirium precautions  CKD stage 3a, GFR 45-59 ml/min (HCC) Per chart review, patient has a history of CKD stage IIIa.  Her creatinine seems to fluctuate quite a bit between 0.8 and 1.2,  currently 1.19. --monitor  HTN --resume bisoprolol, losartan and spironolactone  Prediabetes Last A1c of 6.1% approximately 1 year ago. - A1c 5.8  Falls frequently Likely in the setting of dementia and physical deconditioning. - PT/OT   DVT prophylaxis: Lovenox SQ Code Status: DNR  Family Communication:  Level of care: Progressive Dispo:   The patient is from: SNF Anticipated d/c is to: SNF Anticipated d/c date is: 2-3 days   Subjective and Interval History:  Pt did not answer questions or follow commands.  Needed sitter.   Objective: Vitals:   01/09/23 0435 01/09/23 0800 01/09/23 0820 01/09/23 1824  BP: (!) 129/41  (!) 143/55   Pulse: 64  66   Resp: (!) 22  17   Temp: 99 F (37.2 C)  98.2 F (36.8 C)   TempSrc: Axillary  Axillary   SpO2: 100% 100% 100% 96%  Weight:      Height:        Intake/Output Summary (Last 24 hours) at 01/09/2023 1857 Last data filed at 01/09/2023 1526 Gross per 24 hour  Intake 316.71 ml  Output 750 ml  Net -433.29 ml   Filed Weights   01/08/23 1223  Weight: 68 kg    Examination:   Constitutional: NAD, not oriented, kept eyes closed CV: No cyanosis.   RESP: normal respiratory effort, high-pitched bronchial wheezing Extremities: No effusions, edema in BLE.  Bruising over both kneeds SKIN: warm, dry   Data Reviewed: I have personally reviewed labs and imaging studies  Time spent: 50 minutes  Darlin Priestly, MD [facility] If 7PM-7AM, please contact night-coverage 01/09/2023, 6:57 PM

## 2023-01-09 NOTE — Plan of Care (Signed)
Tele-sitter transitioned to 1:1 Sitter for safety.   Problem: Education: Goal: Knowledge of disease or condition will improve Outcome: Progressing Goal: Knowledge of the prescribed therapeutic regimen will improve Outcome: Progressing Goal: Individualized Educational Video(s) Outcome: Progressing   Problem: Activity: Goal: Ability to tolerate increased activity will improve Outcome: Progressing Goal: Will verbalize the importance of balancing activity with adequate rest periods Outcome: Progressing

## 2023-01-09 NOTE — Evaluation (Signed)
Physical Therapy Evaluation Patient Details Name: Maikayla Casados MRN: [id number] DOB: 1940-07-12 Today's Date: 01/09/2023  History of Present Illness  Patient is a 82 year old female coming [facility] [place] with fall, altered mental status. Found to have acute hypoxic respiratory failure with COPD exacerbation. History of dementia, frequent falls, COPD, chronic diastolic heart failure, CKD stage III A, hypertension, hyperlipidemia.  Clinical Impression  Patient lethargic during evaluation. Per report, patient is from [facility] and ambulates with assistance from staff member using a cane. History of falls.  Today, the patient required maximal assistance for bed mobility. Sitting balance is poor with posterior lean. Unable to safely attempt standing this time due to lethargy and poor activity tolerance. Sp02 remains in the 90's on 2 L02 with no dyspnea noted. Anticipate the patient will need continued staff assistance/supervision after discharge for mobility needs and due to cognition. Recommend to trial continued rehabilitation <3 hours/day after this hospital stay.       If plan is discharge home, recommend the following: A lot of help with walking and/or transfers;A lot of help with bathing/dressing/bathroom;Assist for transportation;Help with stairs or ramp for entrance;Assistance with cooking/housework;Direct supervision/assist for medications management;Direct supervision/assist for financial management   Can travel by private vehicle   No    Equipment Recommendations None recommended by PT  Recommendations for Other Services       Functional Status Assessment Patient has had a recent decline in their functional status and demonstrates the ability to make significant improvements in function in a reasonable and predictable amount of time.     Precautions / Restrictions Precautions Precautions: Fall Restrictions Weight Bearing Restrictions: No      Mobility  Bed Mobility Overal bed  mobility: Needs Assistance Bed Mobility: Supine to Sit, Sit to Supine     Supine to sit: Max assist Sit to supine: Max assist   General bed mobility comments: assistance for trunk and BLE support. increased time and effort required    Transfers                   General transfer comment: unable to at this time due to poor sitting balance, posterior lean    Ambulation/Gait                  Stairs            Wheelchair Mobility     Tilt Bed    Modified Rankin (Stroke Patients Only)       Balance Overall balance assessment: Needs assistance Sitting-balance support: Feet unsupported, Single extremity supported Sitting balance-Leahy Scale: Zero Sitting balance - Comments: maximal assistance requried to maintain sitting balance with posterior lean Postural control: Posterior lean                                   Pertinent Vitals/Pain Pain Assessment Pain Assessment: Faces Faces Pain Scale: No hurt    Home Living Family/patient expects to be discharged to:: Assisted living ([facility])                 Home Equipment: Gilmer Mor - single point Additional Comments: [facility] memory care [facility]F, called and spoke to staff to confirm history    Prior Function Prior Level of Function : Patient poor historian/Family not available;History of Falls (last six months)             Mobility Comments: fall history; received history  from staff at ALF, reports she mostly walks with a SPC with a staff member at all times d/t unsteadiness ADLs Comments: per ALF staff, pt mostly MOD I with grooming, feeding and assist for toileting and bathing with DME, Assist for IADLs     Extremity/Trunk Assessment   Upper Extremity Assessment Upper Extremity Assessment: Generalized weakness;Difficult to assess due to impaired cognition    Lower Extremity Assessment Lower Extremity Assessment: Generalized weakness;Difficult to assess due to impaired  cognition       Communication   Communication Communication: Difficulty following commands/understanding;Difficulty communicating thoughts/reduced clarity of speech Following commands: Follows one step commands with increased time Cueing Techniques: Verbal cues;Tactile cues  Cognition Arousal: Lethargic Behavior During Therapy: Flat affect Overall Cognitive Status: History of cognitive impairments - at baseline                                 General Comments: minimally verbal during session. she does say "good morning." appears disoriented x 4 at this time. difficulty with following single step commands        General Comments General comments (skin integrity, edema, etc.): Sp02 remained in the 90's on 2 L02 with no dyspnea noted with activity    Exercises     Assessment/Plan    PT Assessment Patient needs continued PT services  PT Problem List Decreased strength;Decreased range of motion;Decreased activity tolerance;Decreased balance;Decreased mobility;Decreased cognition       PT Treatment Interventions DME instruction;Gait training;Functional mobility training;Therapeutic exercise;Therapeutic activities;Balance training;Cognitive remediation;Neuromuscular re-education;Patient/family education    PT Goals (Current goals can be found in the Care Plan section)  Acute Rehab PT Goals Patient Stated Goal: patient unable to state PT Goal Formulation: Patient unable to participate in goal setting Time For Goal Achievement: 01/23/23 Potential to Achieve Goals: Poor    Frequency Min 1X/week     Co-evaluation               AM-PAC PT "6 Clicks" Mobility  Outcome Measure Help needed turning from your back to your side while in a flat bed without using bedrails?: A Lot Help needed moving from lying on your back to sitting on the side of a flat bed without using bedrails?: A Lot Help needed moving to and from a bed to a chair (including a wheelchair)?:  Total Help needed standing up from a chair using your arms (e.g., wheelchair or bedside chair)?: Total Help needed to walk in hospital room?: Total Help needed climbing 3-5 steps with a railing? : Total 6 Click Score: 8    End of Session Equipment Utilized During Treatment: Oxygen Activity Tolerance: Patient limited by fatigue Patient left: in bed;with call bell/phone within reach;with bed alarm set Nurse Communication: Mobility status PT Visit Diagnosis: Muscle weakness (generalized) (M62.81);Unsteadiness on feet (R26.81)    Time: 2130-8657 PT Time Calculation (min) (ACUTE ONLY): 10 min   Charges:   PT Evaluation $PT Eval Low Complexity: 1 Low   PT General Charges $$ ACUTE PT VISIT: 1 Visit         Donna Bernard, PT, MPT   Ina [employer] 01/09/2023, 9:49 AM

## 2023-01-09 NOTE — Evaluation (Signed)
Occupational Therapy Evaluation Patient Details Name: Samantha Carpenter MRN: [id number] DOB: 11-08-1940 Today's Date: 01/09/2023   History of Present Illness Patient is a 82 year old female coming [facility] [place] with fall, altered mental status. Found to have acute hypoxic respiratory failure with COPD exacerbation. History of dementia, frequent falls, COPD, chronic diastolic heart failure, CKD stage III A, hypertension, hyperlipidemia.   Clinical Impression   Pt was seen for OT evaluation this date. Prior to hospital admission, pt was living at [facility] in memory care unit. Per staff, she mostly used a SPC and assist from another person for all mobility d/t unsteadiness. Has been advised to use W/C d/t high number of falls, but refuses. She had assist with ADLs for safety/balance, however per staff on her good days she could do more with SUP/SBA and on worse days needed more assist. Always needed redirection d/t dementia.  Pt presents to acute OT demonstrating impaired ADL performance and functional mobility 2/2 increased weakness and confusion,  as well as balance deficits (See OT problem list for additional functional deficits). Pt currently requires CGA for bed mobility for safety as pt scoots very close to edge of bed. She performed STS from EOB with HHA x1 and needed Mod A, verb cues. Lateral steps taken to [facility] and needed Max A as pt is very unsteady and confused. Pt returned to seated EOB and with increased time and cueing returned to supine with CGA/SBA. Offered washcloth for pt to wash her face and she would agree to do it, but not follow through. Essentially total/Max A to wash her face. Pt would benefit from skilled OT services to address noted impairments and functional limitations (see below for any additional details) in order to maximize safety and independence while minimizing falls risk and caregiver burden. Do anticipate the need for follow up OT services upon acute hospital DC.  Recommended SNF based on current level and confusion, but may improve back to baseline to return to ALF.       If plan is discharge home, recommend the following: A lot of help with walking and/or transfers;A lot of help with bathing/dressing/bathroom;Supervision due to cognitive status;Direct supervision/assist for medications management;Assistance with cooking/housework;Help with stairs or ramp for entrance    Functional Status Assessment  Patient has had a recent decline in their functional status and demonstrates the ability to make significant improvements in function in a reasonable and predictable amount of time.  Equipment Recommendations  Other (comment) (defer to next venue, may have needed equipment)    Recommendations for Other Services       Precautions / Restrictions Precautions Precautions: Fall Restrictions Weight Bearing Restrictions: No      Mobility Bed Mobility Overal bed mobility: Needs Assistance Bed Mobility: Supine to Sit, Sit to Supine     Supine to sit: Contact guard, Supervision Sit to supine: Contact guard assist, Supervision   General bed mobility comments: for safety d/t dementia and high fall risk    Transfers Overall transfer level: Needs assistance Equipment used: 1 person hand held assist Transfers: Sit to/from Stand Sit to Stand: Mod assist           General transfer comment: Mod A for STS from EOB, Mod to Max A via HHA x1 for lateral steps to [facility], no walker in room      Balance Overall balance assessment: Needs assistance Sitting-balance support: Bilateral upper extremity supported Sitting balance-Leahy Scale: Fair       Standing balance-Leahy Scale:  Poor Standing balance comment: needs Mod to Max A for standing balance                           ADL either performed or assessed with clinical judgement   ADL Overall ADL's : Needs assistance/impaired     Grooming: Wash/dry face;Bed level Grooming Details  (indicate cue type and reason): total assist, pt unable to perform, very confused                               General ADL Comments: HHA x2 in room and needed Mod to Max A for steadying to take a few steps to [facility] and stand     Vision         Perception         Praxis         Pertinent Vitals/Pain Pain Assessment Pain Assessment: Faces Faces Pain Scale: No hurt Pain Intervention(s): Monitored during session     Extremity/Trunk Assessment Upper Extremity Assessment Upper Extremity Assessment: Generalized weakness;Difficult to assess due to impaired cognition   Lower Extremity Assessment Lower Extremity Assessment: Generalized weakness;Difficult to assess due to impaired cognition       Communication Communication Communication: Difficulty following commands/understanding;Difficulty communicating thoughts/reduced clarity of speech Following commands: Follows one step commands with increased time Cueing Techniques: Verbal cues;Tactile cues   Cognition Arousal: Alert Behavior During Therapy: Restless (fidgeting and trying to get OOB on entry to room) Overall Cognitive Status: History of cognitive impairments - at baseline                                 General Comments: pt demented and in memory care ALF at baseline; not oriented to person, place, time, situation this morning     General Comments  Sp02 remained in the 90's on 2 L02 with no dyspnea noted with activity    Exercises     Shoulder Instructions      Home Living Family/patient expects to be discharged to:: Assisted living ([facility])                             Home Equipment: Gilmer Mor - single point   Additional Comments: [facility] memory care ALF, called and spoke to staff to confirm history      Prior Functioning/Environment Prior Level of Function : Patient poor historian/Family not available;History of Falls (last six months)              Mobility Comments: fall history; received history from staff at ALF, reports she mostly walks with a SPC with a staff member at all times d/t unsteadiness ADLs Comments: per ALF staff, pt mostly MOD I with grooming, feeding and assist for toileting and bathing with DME, Assist for IADLs        OT Problem List: Decreased strength;Decreased cognition;Decreased safety awareness;Impaired balance (sitting and/or standing)      OT Treatment/Interventions: Self-care/ADL training;Therapeutic exercise;Therapeutic activities;Patient/family education;Balance training    OT Goals(Current goals can be found in the care plan section) Acute Rehab OT Goals Patient Stated Goal: improve strength to return to ALF OT Goal Formulation: With patient Time For Goal Achievement: 01/23/23 Potential to Achieve Goals: Fair ADL Goals Pt Will Perform Grooming: with set-up;sitting Pt Will Perform Lower Body Dressing:  with min assist;sit to/from stand;sitting/lateral leans Pt Will Transfer to Toilet: with min assist;ambulating;grab bars;regular height toilet Pt Will Perform Toileting - Clothing Manipulation and hygiene: with min assist;sit to/from stand  OT Frequency: Min 1X/week    Co-evaluation              AM-PAC OT "6 Clicks" Daily Activity     Outcome Measure Help from another person eating meals?: A Little Help from another person taking care of personal grooming?: A Little Help from another person toileting, which includes using toliet, bedpan, or urinal?: A Lot Help from another person bathing (including washing, rinsing, drying)?: A Lot Help from another person to put on and taking off regular upper body clothing?: A Lot Help from another person to put on and taking off regular lower body clothing?: A Lot 6 Click Score: 14   End of Session Equipment Utilized During Treatment: Gait belt;Oxygen  Activity Tolerance: Patient tolerated treatment well Patient left: in bed;with call bell/phone within  reach;with bed alarm set  OT Visit Diagnosis: Unsteadiness on feet (R26.81);Other abnormalities of gait and mobility (R26.89);Repeated falls (R29.6);Muscle weakness (generalized) (M62.81);History of falling (Z91.81)                Time: 1610-9604 OT Time Calculation (min): 23 min Charges:  OT General Charges $OT Visit: 1 Visit OT Evaluation $OT Eval Moderate Complexity: 1 Mod OT Treatments $Therapeutic Activity: 8-22 mins Rhilyn Battle, OTR/L 01/09/23, 9:53 AM Constance Goltz 01/09/2023, 9:53 AM

## 2023-01-09 NOTE — TOC Initial Note (Signed)
Transition of Care [facility]) - Initial/Assessment Note    Patient Details  Name: Samantha Carpenter MRN: [id number] Date of Birth: 1940-11-22  Transition of [facility]) CM/SW Contact:    Darolyn Rua, LCSW Phone Number: 01/09/2023, 2:44 PM  Clinical Narrative:                  Patient disoriented x4, CSW spoke with patient's sister [name] who confirms patient has been at [facility] and is agreeable to SNF, preference [facility].   Referrals sent, pending bed offers at this time.   Expected Discharge Plan: Skilled Nursing Facility Barriers to Discharge: Continued Medical Work up   Patient Goals and CMS Choice Patient states their goals for this hospitalization and ongoing recovery are:: to go home CMS Medicare.gov Compare Post Acute Care list provided to:: Patient Choice offered to / list presented to : Patient      Expected Discharge Plan and Services       Living arrangements for the past 2 months: Assisted Living Facility                                      Prior Living Arrangements/Services Living arrangements for the past 2 months: Assisted Living Facility Lives with:: Facility Resident                   Activities of Daily Living      Permission Sought/Granted                  Emotional Assessment              Admission diagnosis:  Pneumonia due to infectious organism, unspecified laterality, unspecified part of lung [J18.9] Acute hypoxic respiratory failure (HCC) [J96.01] Patient Active Problem List   Diagnosis Date Noted   Acute hypoxic respiratory failure (HCC) 01/08/2023   CAP (community acquired pneumonia) 01/08/2023   Acute hyponatremia 04/23/2022   Chronic diastolic CHF (congestive heart failure) (HCC) 04/16/2022   Hyponatremia 04/16/2022   Hypothermia 04/16/2022   Hypomagnesemia 02/15/2022   RSV (respiratory syncytial virus pneumonia) 02/15/2022   COPD with acute exacerbation (HCC) 02/10/2022   Hypokalemia  02/10/2022   Falls frequently 02/10/2022   Hypoglycemia 01/11/2022   AKI (acute kidney injury) (HCC) 01/10/2022   Pressure injury of skin 01/09/2022   COPD exacerbation (HCC) 01/08/2022   Prediabetes 01/08/2022   Dementia with behavioral disturbance (HCC) 11/15/2021   Hypoxia 11/15/2021   COVID-19 virus infection 11/14/2021   Right rib fracture 11/09/2020   Fall at home, initial encounter 11/09/2020   COPD (chronic obstructive pulmonary disease) (HCC) 11/09/2020   Depression with anxiety 11/09/2020   Elevated troponin 11/09/2020   Acute respiratory failure with hypoxia (HCC) 11/09/2020   HTN (hypertension) 11/09/2020   Abrasions of multiple sites    Pure hypercholesterolemia 06/09/2018   Recurrent major depressive disorder, 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 [place] [address] Phone: [phone] Fax: [phone]     Social  Determinants of Health (SDOH) Social History: SDOH Screenings   Food Insecurity: No Food Insecurity (11/07/2022)  Housing: Low Risk  (11/07/2022)  Transportation Needs: No Transportation Needs (11/07/2022)  Utilities: Not At Risk (11/07/2022)  Financial Resource Strain: Low Risk  (09/02/2018)  Physical Activity: Sufficiently Active (09/02/2018)  Social Connections: Unknown (09/02/2018)  Stress: No Stress Concern Present (09/02/2018)  Tobacco Use: Medium Risk (01/08/2023)   SDOH Interventions:     Readmission Risk Interventions    01/11/2022    2:05 PM 01/09/2022    2:23 PM  Readmission Risk Prevention Plan  Transportation Screening  Complete  HRI or Home Care  Consult Complete   Palliative Care Screening  Not Applicable  Medication Review (RN Care Manager)  Complete

## 2023-01-09 NOTE — Progress Notes (Signed)
Despite telesitter camera, patient continuing to attempt to get out of bed.  Telesitter discontinued and replaced with 1:1 Recruitment consultant.

## 2023-01-10 ENCOUNTER — Inpatient Hospital Stay: Payer: Medicare HMO

## 2023-01-10 DIAGNOSIS — J9601 Acute respiratory failure with hypoxia: Secondary | ICD-10-CM | POA: Diagnosis not present

## 2023-01-10 LAB — BLOOD GAS, ARTERIAL
Acid-Base Excess: 10.5 mmol/L — ABNORMAL HIGH (ref 0.0–2.0)
Bicarbonate: 37.8 mmol/L — ABNORMAL HIGH (ref 20.0–28.0)
Delivery systems: POSITIVE
FIO2: 35 %
O2 Saturation: 98.8 %
Patient temperature: 37
pCO2 arterial: 61 mm[Hg] — ABNORMAL HIGH (ref 32–48)
pH, Arterial: 7.4 (ref 7.35–7.45)
pO2, Arterial: 99 mm[Hg] (ref 83–108)

## 2023-01-10 LAB — BASIC METABOLIC PANEL
Anion gap: 10 (ref 5–15)
BUN: 37 mg/dL — ABNORMAL HIGH (ref 8–23)
CO2: 32 mmol/L (ref 22–32)
Calcium: 8.9 mg/dL (ref 8.9–10.3)
Chloride: 102 mmol/L (ref 98–111)
Creatinine, Ser: 1.26 mg/dL — ABNORMAL HIGH (ref 0.44–1.00)
GFR, Estimated: 42 mL/min — ABNORMAL LOW (ref >60)
Glucose, Bld: 139 mg/dL — ABNORMAL HIGH (ref 70–99)
Potassium: 3.4 mmol/L — ABNORMAL LOW (ref 3.5–5.1)
Sodium: 144 mmol/L (ref 135–145)

## 2023-01-10 LAB — LEGIONELLA PNEUMOPHILA SEROGP 1 UR AG: L. pneumophila Serogp 1 Ur Ag: NEGATIVE

## 2023-01-10 MED ORDER — FUROSEMIDE 10 MG/ML IJ SOLN
40.0000 mg | Freq: Once | INTRAMUSCULAR | Status: AC
  Administered 2023-01-10: 40 mg via INTRAVENOUS
  Filled 2023-01-10: qty 4

## 2023-01-10 MED ORDER — CLONAZEPAM 0.25 MG PO TBDP: 0.2500 mg | ORAL_TABLET | Freq: Once | ORAL | Status: DC

## 2023-01-10 MED ORDER — METHYLPREDNISOLONE SODIUM SUCC 40 MG IJ SOLR
40.0000 mg | Freq: Two times a day (BID) | INTRAMUSCULAR | Status: DC
  Administered 2023-01-10 – 2023-01-11 (×3): 40 mg via INTRAVENOUS
  Filled 2023-01-10 (×3): qty 1

## 2023-01-10 MED ORDER — IPRATROPIUM-ALBUTEROL 0.5-2.5 (3) MG/3ML IN SOLN
RESPIRATORY_TRACT | Status: AC
  Filled 2023-01-10: qty 3

## 2023-01-10 MED ORDER — IPRATROPIUM-ALBUTEROL 0.5-2.5 (3) MG/3ML IN SOLN
3.0000 mL | Freq: Four times a day (QID) | RESPIRATORY_TRACT | Status: DC
  Administered 2023-01-10 – 2023-01-11 (×3): 3 mL via RESPIRATORY_TRACT
  Filled 2023-01-10 (×3): qty 3

## 2023-01-10 MED ORDER — IPRATROPIUM-ALBUTEROL 0.5-2.5 (3) MG/3ML IN SOLN
3.0000 mL | Freq: Once | RESPIRATORY_TRACT | Status: AC
  Administered 2023-01-10: 3 mL via RESPIRATORY_TRACT

## 2023-01-10 MED ORDER — POTASSIUM CHLORIDE 20 MEQ PO PACK
40.0000 meq | PACK | Freq: Once | ORAL | Status: AC
  Administered 2023-01-10: 40 meq via ORAL
  Filled 2023-01-10: qty 2

## 2023-01-10 NOTE — Progress Notes (Signed)
MEWS Progress Note  Patient Details Name: Samantha Carpenter MRN: [id number] DOB: 06-28-1940 Today's Date: 01/08/2023   MEWS Flowsheet Documentation:  Assess: MEWS Score Temp: 98.5 F (36.9 C) BP: (!) 155/58 MAP (mmHg): 86 Pulse Rate: 62 ECG Heart Rate: 63 Resp: (!) 24 Level of Consciousness: Alert SpO2: 99 % O2 Device: Bi-PAP O2 Flow Rate (L/min): 2 L/min FiO2 (%): 35 % Assess: MEWS Score MEWS Temp: 0 MEWS Systolic: 0 MEWS Pulse: 0 MEWS RR: 1 MEWS LOC: 0 MEWS Score: 1 MEWS Score Color: Green Assess: SIRS CRITERIA SIRS Temperature : 0 SIRS Respirations : 1 SIRS Pulse: 0 SIRS WBC: 0 SIRS Score Sum : 1 SIRS Temperature : 0 SIRS Pulse: 0 SIRS Respirations : 1 SIRS WBC: 0 SIRS Score Sum : 1 Assess: if the MEWS score is Yellow or Red Were vital signs accurate and taken at a resting state?: Yes Does the patient meet 2 or more of the SIRS criteria?: No Does the patient have a confirmed or suspected source of infection?: No MEWS guidelines implemented : No, previously yellow, continue vital signs every 4 hours Treat MEWS Interventions: Considered administering scheduled or prn medications/treatments as ordered Take Vital Signs Increase Vital Sign Frequency : Red: Q1hr x2, continue Q4hrs until patient remains green for 12hrs Escalate MEWS: Escalate: Red: Discuss with charge nurse and notify provider. Consider notifying RRT. If remains red for 2 hours consider need for higher level of care        Devell Parkerson [name]

## 2023-01-10 NOTE — Progress Notes (Signed)
OT Cancellation Note  Patient Details Name: Coren Husain MRN: [id number] DOB: 09-03-1940   Cancelled Treatment:    Reason Eval/Treat Not Completed: Medical issues which prohibited therapy. Pt is currently on Bipap and therapy does not work with patient's while on this. Will follow POC and check back in tomorrow if back on nasal cannula.  Constance Goltz 01/10/2023, 4:03 PM

## 2023-01-10 NOTE — Progress Notes (Signed)
PROGRESS NOTE    Samantha Carpenter  ZOX:[id number] DOB: [birth year] DOA: 01/08/2023 PCP: Pcp, No  251A/251A-AA  LOS: 2 days   Brief hospital course:   Assessment & Plan: Samantha Carpenter is a 82 y.o. female with medical history significant of dementia complicated by frequent falls, COPD, chronic diastolic heart failure, CKD stage III A, hypertension, hyperlipidemia who presents to the ED due to a altered mental status.    * Acute hypoxic and hypercapnic respiratory failure (HCC) Likely in the setting of a COPD exacerbation and potentially underlying pneumonia.  On reassessment, patient's tachypnea was noted to be progressively worsening with rates in the 40s, so patient was placed on BiPAP. --weaned off BiPAP and on 2L [place] next morning, however, back on BiPAP for the same tachypnea.   --ABG today showed pCO2 61 Plan: --cont BiPAP PRN - Management of COPD and CAP as noted below  COPD with acute exacerbation (HCC) Given increased work of breathing, shortness of breath and hypoxia, started tx as a COPD exacerbation. - Started prednisone 40 mg and scheduled DuoNebs. Plan: --switch steroid to IV solumedrol since pt missed 2 doses of prednisone due to being on BiPAP. --increase DuoNeb scheduled back up to QID  CAP (community acquired pneumonia) On chest x-ray, there is a possible retrocardiac opacity and given elevated WBC and reported shortness of breath, started on tx for community-acquired pneumonia.  Procal 0.72. --cont ceftriaxone and azithro  Chronic diastolic CHF (congestive heart failure) (HCC) --due to no improvement in respiratory status, and BNP 992, will tx with diuresis. --IV lasix 40 mg x1 today --Strict I/O  Dementia with behavioral disturbance Samantha Carpenter) Patient has been increasingly altered per SNF over the last couple days, likely due to underlying infection and respiratory symptoms.   --of note, pt has many sedative medications on home med list Plan: - taper home sedatives,  starting with Klonopin - Delirium precautions  CKD stage 3a, GFR 45-59 ml/min (HCC) Per chart review, patient has a history of CKD stage IIIa.  Her creatinine seems to fluctuate quite a bit between 0.8 and 1.2, currently 1.19. --monitor  HTN --cont bisoprolol, losartan and spironolactone  Prediabetes Last A1c of 6.1% approximately 1 year ago. - A1c 5.8  Falls frequently Likely in the setting of dementia and physical deconditioning. - PT/OT   DVT prophylaxis: Lovenox SQ Code Status: DNR  Family Communication:  Level of care: Progressive Dispo:   The patient is from: ALF Anticipated d/c is to: SNF Anticipated d/c date is: >3 days   Subjective and Interval History:  RN reported pt had tachypnea up to 40's, so was placed back on BiPAP.     Objective: Vitals:   01/10/23 1305 01/10/23 1314 01/10/23 1423 01/10/23 1628  BP:   (!) 145/89   Pulse:   79   Resp: (!) 42 (!) 32 (!) 26   Temp:   98.3 F (36.8 C) 98.9 F (37.2 C)  TempSrc:    Oral  SpO2:   98%   Weight:      Height:        Intake/Output Summary (Last 24 hours) at 01/10/2023 1640 Last data filed at 01/10/2023 8119 Gross per 24 hour  Intake 370 ml  Output 100 ml  Net 270 ml   Filed Weights   01/08/23 1223  Weight: 68 kg    Examination:   Constitutional: NAD, somnolent, not oriented CV: No cyanosis.   RESP: on BiPAP SKIN: warm, dry   Data Reviewed: I have personally reviewed  labs and imaging studies  Time spent: 50 minutes  Darlin Priestly, MD [facility] If 7PM-7AM, please contact night-coverage 01/10/2023, 4:40 PM

## 2023-01-10 NOTE — Progress Notes (Signed)
Patient noted to be on bipap at this time with mitts on. Patient not in distress. Appears to pull at mask at times. Will continue to monitor

## 2023-01-11 DIAGNOSIS — J9601 Acute respiratory failure with hypoxia: Secondary | ICD-10-CM | POA: Diagnosis not present

## 2023-01-11 LAB — CBC
HCT: 36 % (ref 36.0–46.0)
Hemoglobin: 11.4 g/dL — ABNORMAL LOW (ref 12.0–15.0)
MCH: 30.8 pg (ref 26.0–34.0)
MCHC: 31.7 g/dL (ref 30.0–36.0)
MCV: 97.3 fL (ref 80.0–100.0)
Platelets: 374 10*3/uL (ref 150–400)
RBC: 3.7 MIL/uL — ABNORMAL LOW (ref 3.87–5.11)
RDW: 13 % (ref 11.5–15.5)
WBC: 5.5 10*3/uL (ref 4.0–10.5)
nRBC: 0 % (ref 0.0–0.2)

## 2023-01-11 LAB — BASIC METABOLIC PANEL
Anion gap: 12 (ref 5–15)
BUN: 36 mg/dL — ABNORMAL HIGH (ref 8–23)
CO2: 34 mmol/L — ABNORMAL HIGH (ref 22–32)
Calcium: 9.1 mg/dL (ref 8.9–10.3)
Chloride: 101 mmol/L (ref 98–111)
Creatinine, Ser: 1.1 mg/dL — ABNORMAL HIGH (ref 0.44–1.00)
GFR, Estimated: 50 mL/min — ABNORMAL LOW (ref >60)
Glucose, Bld: 197 mg/dL — ABNORMAL HIGH (ref 70–99)
Potassium: 4 mmol/L (ref 3.5–5.1)
Sodium: 147 mmol/L — ABNORMAL HIGH (ref 135–145)

## 2023-01-11 LAB — MAGNESIUM: Magnesium: 2.4 mg/dL (ref 1.7–2.4)

## 2023-01-11 LAB — MRSA NEXT GEN BY PCR, NASAL: MRSA by PCR Next Gen: DETECTED — AB

## 2023-01-11 MED ORDER — MUPIROCIN 2 % EX OINT
1.0000 | TOPICAL_OINTMENT | Freq: Two times a day (BID) | CUTANEOUS | Status: AC
  Administered 2023-01-11 – 2023-01-16 (×9): 1 via NASAL
  Filled 2023-01-11: qty 22

## 2023-01-11 MED ORDER — INFLUENZA VAC A&B SURF ANT ADJ 0.5 ML IM SUSY
0.5000 mL | PREFILLED_SYRINGE | INTRAMUSCULAR | Status: DC
  Filled 2023-01-11: qty 0.5

## 2023-01-11 MED ORDER — CHLORHEXIDINE GLUCONATE CLOTH 2 % EX PADS
6.0000 | MEDICATED_PAD | Freq: Every day | CUTANEOUS | Status: DC
  Administered 2023-01-12 – 2023-01-13 (×2): 6 via TOPICAL

## 2023-01-11 MED ORDER — METHYLPREDNISOLONE SODIUM SUCC 40 MG IJ SOLR
40.0000 mg | Freq: Every day | INTRAMUSCULAR | Status: DC
  Administered 2023-01-12 – 2023-01-14 (×3): 40 mg via INTRAVENOUS
  Filled 2023-01-11 (×3): qty 1

## 2023-01-11 MED ORDER — HALOPERIDOL LACTATE 5 MG/ML IJ SOLN
1.0000 mg | Freq: Four times a day (QID) | INTRAMUSCULAR | Status: DC | PRN
  Administered 2023-01-11 – 2023-01-12 (×3): 2 mg via INTRAVENOUS
  Filled 2023-01-11 (×5): qty 1

## 2023-01-11 MED ORDER — HYDRALAZINE HCL 20 MG/ML IJ SOLN
10.0000 mg | Freq: Once | INTRAMUSCULAR | Status: AC
  Administered 2023-01-11: 10 mg via INTRAVENOUS
  Filled 2023-01-11: qty 1

## 2023-01-11 MED ORDER — FUROSEMIDE 10 MG/ML IJ SOLN
40.0000 mg | Freq: Two times a day (BID) | INTRAMUSCULAR | Status: DC
  Administered 2023-01-11: 40 mg via INTRAVENOUS
  Filled 2023-01-11 (×2): qty 4

## 2023-01-11 MED ORDER — IPRATROPIUM-ALBUTEROL 0.5-2.5 (3) MG/3ML IN SOLN
3.0000 mL | Freq: Three times a day (TID) | RESPIRATORY_TRACT | Status: DC
  Administered 2023-01-11 – 2023-01-12 (×3): 3 mL via RESPIRATORY_TRACT
  Filled 2023-01-11 (×3): qty 3

## 2023-01-11 MED ORDER — FUROSEMIDE 10 MG/ML IJ SOLN
40.0000 mg | Freq: Once | INTRAMUSCULAR | Status: AC
  Administered 2023-01-11: 40 mg via INTRAVENOUS

## 2023-01-11 MED ORDER — HYDRALAZINE HCL 20 MG/ML IJ SOLN
10.0000 mg | Freq: Four times a day (QID) | INTRAMUSCULAR | Status: DC | PRN
Start: 2023-01-11 — End: 2023-01-23
  Administered 2023-01-11 – 2023-01-22 (×7): 10 mg via INTRAVENOUS
  Filled 2023-01-11 (×7): qty 1

## 2023-01-11 NOTE — Care Management Important Message (Signed)
Important Message  Patient Details  Name: Samantha Carpenter MRN: [id number] Date of Birth: 02-08-1941   Important Message Given:  No (patient disoriented x4, sister called, no answer and vm is full unable to lvm)     Darolyn Rua, LCSW 01/11/2023, 10:56 AM

## 2023-01-11 NOTE — Plan of Care (Addendum)
Patient remains on Cardiac PCU at time of writing. Patient is disoriented X 4. Patient's admission profile completed overnight by this RN. Patient was screen for MRSA per [facility]PA and found to be MRSA positive.  Edit: Secure chat sent to Manuela Schwartz, NP, at 2149 hrs: "S: just FYI, patient's MRSA PCR is positive; was not screened upon admission. B: patient is here from SNF and being treated for PNA. A: Patient is receiving azithromycin IV for PNA. R: just placed the MRSA positive standing orders (bactroban nasal cream, and CHG baths)."  Edit: 2300 hrs: Secure Chat sent to Manuela Schwartz, NP: "S: Patient's BP 193 / 70. B: patient here with PNA, history of HTN. A: Needed PRN hydralazine overnight last night; no prn orders currently. R: Likely need some PRN/standing order for HTN. Thank you in advance. " New orders noted for hydralazine.  Problem: Education: Goal: Knowledge of disease or condition will improve Outcome: Not Progressing Goal: Knowledge of the prescribed therapeutic regimen will improve Outcome: Not Progressing Goal: Individualized Educational Video(s) Outcome: Not Progressing   Problem: Activity: Goal: Ability to tolerate increased activity will improve Outcome: Not Progressing Goal: Will verbalize the importance of balancing activity with adequate rest periods Outcome: Not Progressing   Problem: Respiratory: Goal: Ability to maintain a clear airway will improve Outcome: Not Progressing Goal: Levels of oxygenation will improve Outcome: Not Progressing Goal: Ability to maintain adequate ventilation will improve Outcome: Not Progressing   Problem: Activity: Goal: Ability to tolerate increased activity will improve Outcome: Not Progressing   Problem: Clinical Measurements: Goal: Ability to maintain a body temperature in the normal range will improve Outcome: Not Progressing   Problem: Respiratory: Goal: Ability to maintain adequate ventilation will improve Outcome: Not  Progressing Goal: Ability to maintain a clear airway will improve Outcome: Not Progressing   Problem: Education: Goal: Knowledge of General Education information will improve Description: Including pain rating scale, medication(s)/side effects and non-pharmacologic comfort measures Outcome: Not Progressing   Problem: Health Behavior/Discharge Planning: Goal: Ability to manage health-related needs will improve Outcome: Not Progressing   Problem: Clinical Measurements: Goal: Ability to maintain clinical measurements within normal limits will improve Outcome: Not Progressing Goal: Will remain free from infection Outcome: Not Progressing Goal: Diagnostic test results will improve Outcome: Not Progressing Goal: Respiratory complications will improve Outcome: Not Progressing Goal: Cardiovascular complication will be avoided Outcome: Not Progressing   Problem: Activity: Goal: Risk for activity intolerance will decrease Outcome: Not Progressing   Problem: Nutrition: Goal: Adequate nutrition will be maintained Outcome: Not Progressing   Problem: Coping: Goal: Level of anxiety will decrease Outcome: Not Progressing   Problem: Elimination: Goal: Will not experience complications related to bowel motility Outcome: Not Progressing Goal: Will not experience complications related to urinary retention Outcome: Not Progressing   Problem: Pain Management: Goal: General experience of comfort will improve Outcome: Not Progressing   Problem: Safety: Goal: Ability to remain free from injury will improve Outcome: Not Progressing   Problem: Skin Integrity: Goal: Risk for impaired skin integrity will decrease Outcome: Not Progressing

## 2023-01-11 NOTE — Progress Notes (Signed)
Physical Therapy Treatment Patient Details Name: Samantha Carpenter MRN: [id number] DOB: 1941-02-04 Today's Date: 01/11/2023   History of Present Illness Patient is a 82 year old female coming [facility] [place] with fall, altered mental status. Found to have acute hypoxic respiratory failure with COPD exacerbation. History of dementia, frequent falls, COPD, chronic diastolic heart failure, CKD stage III A, hypertension, hyperlipidemia.    PT Comments  Patient is lethargic during session but able to follow single step commands with multimodal cues. Standing performed with +2 person assistance. Poor standing balance with posterior lean. Unable to progress walking this session due to poor activity tolerance. Recommend to continue rehabilitation after this hospital stay < 3 hours/day.    If plan is discharge home, recommend the following: A lot of help with walking and/or transfers;A lot of help with bathing/dressing/bathroom;Assist for transportation;Help with stairs or ramp for entrance;Assistance with cooking/housework;Direct supervision/assist for medications management;Direct supervision/assist for financial management;Supervision due to cognitive status   Can travel by private vehicle     No  Equipment Recommendations  None recommended by PT    Recommendations for Other Services       Precautions / Restrictions Precautions Precautions: Fall Restrictions Weight Bearing Restrictions: No     Mobility  Bed Mobility Overal bed mobility: Needs Assistance Bed Mobility: Supine to Sit, Sit to Supine     Supine to sit: Mod assist Sit to supine: Mod assist   General bed mobility comments: verbal cues for technique, task initiation    Transfers Overall transfer level: Needs assistance Equipment used: 2 person hand held assist Transfers: Sit to/from Stand Sit to Stand: Mod assist, +2 physical assistance           General transfer comment: lifting and lowering assistance provided     Ambulation/Gait             Pre-gait activities: standing tolerance of around 3 minutes. Mod A +2 for taking 2 small side steps along edge of bed. unable to progress ambulation due to cognition and fatigue with activity     Stairs             Wheelchair Mobility     Tilt Bed    Modified Rankin (Stroke Patients Only)       Balance Overall balance assessment: Needs assistance Sitting-balance support: Feet supported Sitting balance-Leahy Scale: Poor Sitting balance - Comments: close stand by assistance for safety with posterior lean initially that improved with increased time Postural control: Posterior lean Standing balance support: Bilateral upper extremity supported Standing balance-Leahy Scale: Poor Standing balance comment: Min A- Mod A +2 person assistance                            Cognition Arousal: Lethargic Behavior During Therapy: Flat affect Overall Cognitive Status: History of cognitive impairments - at baseline                                 General Comments: difficulty with following single step commands without multimodal cues        Exercises      General Comments General comments (skin integrity, edema, etc.): Sp02 91-93% after mobility on 4 L02.      Pertinent Vitals/Pain Pain Assessment Pain Assessment: No/denies pain    Home Living  Prior Function            PT Goals (current goals can now be found in the care plan section) Acute Rehab PT Goals Patient Stated Goal: none stated PT Goal Formulation: Patient unable to participate in goal setting Time For Goal Achievement: 01/23/23 Potential to Achieve Goals: Poor Progress towards PT goals: Progressing toward goals    Frequency    Min 1X/week      PT Plan      Co-evaluation PT/OT/SLP Co-Evaluation/Treatment: Yes Reason for Co-Treatment: Complexity of the patient's impairments (multi-system  involvement) PT goals addressed during session: Mobility/safety with mobility OT goals addressed during session: ADL's and self-care      AM-PAC PT "6 Clicks" Mobility   Outcome Measure  Help needed turning from your back to your side while in a flat bed without using bedrails?: A Lot Help needed moving from lying on your back to sitting on the side of a flat bed without using bedrails?: A Lot Help needed moving to and from a bed to a chair (including a wheelchair)?: Total Help needed standing up from a chair using your arms (e.g., wheelchair or bedside chair)?: Total Help needed to walk in hospital room?: Total Help needed climbing 3-5 steps with a railing? : Total 6 Click Score: 8    End of Session Equipment Utilized During Treatment: Oxygen Activity Tolerance: Patient tolerated treatment well;Patient limited by fatigue Patient left: in bed;with call bell/phone within reach;with bed alarm set Nurse Communication: Mobility status PT Visit Diagnosis: Muscle weakness (generalized) (M62.81);Unsteadiness on feet (R26.81)     Time: 1660-6301 PT Time Calculation (min) (ACUTE ONLY): 27 min  Charges:    $Therapeutic Activity: 8-22 mins PT General Charges $$ ACUTE PT VISIT: 1 Visit                     Donna Bernard, PT, MPT    Samantha Carpenter 01/11/2023, 2:33 PM

## 2023-01-11 NOTE — Progress Notes (Signed)
CROSS COVER NOTE  NAME: Samantha Carpenter MRN: [id number] DOB : Feb 17, 1941    Concern as stated by nurse / staff    Message received from nurse S: Patient's BP 193 / 70. B: patient here with PNA, history of HTN. A: Needed PRN hydralazine overnight last night; no prn orders currently. R: Likely need some PRN/standing order for HTN. Thank you in advance.    Pertinent findings on chart review:   Assessment and  Interventions   Assessment:    01/11/2023   11:05 PM 01/11/2023   11:00 PM 01/11/2023   10:00 PM  Vitals with BMI  Systolic 193 200   Diastolic 70 76   Pulse 69 73 63    Plan: Uncontrolled hypertension may be secondary to parenteral steroid therapy - hydralazine 10 mg IV every 6h as needed for systolic above 180     Donnie Mesa NP [facility] Cross Cover 7pm-7am - check amion for availability Pager [phone]

## 2023-01-11 NOTE — TOC Progression Note (Addendum)
Transition of Care [facility]) - Progression Note    Patient Details  Name: Samantha Carpenter MRN: [id number] Date of Birth: 07-21-1940  Transition of Care [facility]) CM/SW Contact  Darolyn Rua, [place] Phone Number: 01/11/2023, 10:51 AM  Clinical Narrative:     10/25:  Update: [facility] called back, [facility] but realizes [facility]'s only local bed offer at this time and does not want referrals sent out to [place]/larger geographical area. [name] gave CSW approval to start insurance auth for [facility]. Insurance Berkley Harvey has been started.  CSW has reached out to Bertrand at [facility], she reports unable to make a bed offer, 1 bed offer of [facility] , attempted to provide to sister Ellsworth, no answer and voice mailbox is full.   10/24: [facility] reviewing referral pending acceptance  Expected Discharge Plan: Skilled Nursing Facility Barriers to Discharge: Continued Medical Work up  Expected Discharge Plan and Services       Living arrangements for the past 2 months: Assisted Living Facility                                       Social Determinants of Health (SDOH) Interventions SDOH Screenings   Food Insecurity: No Food Insecurity (11/07/2022)  Housing: Low Risk  (11/07/2022)  Transportation Needs: No Transportation Needs (11/07/2022)  Utilities: Not At Risk (11/07/2022)  Financial Resource Strain: Low Risk  (09/02/2018)  Physical Activity: Sufficiently Active (09/02/2018)  Social Connections: Unknown (09/02/2018)  Stress: No Stress Concern Present (09/02/2018)  Tobacco Use: Medium Risk (01/08/2023)    Readmission Risk Interventions    01/11/2022    2:05 PM 01/09/2022    2:23 PM  Readmission Risk Prevention Plan  Transportation Screening  Complete  HRI or Home Care Consult Complete   Palliative Care Screening  Not Applicable  Medication Review (RN Care Manager)  Complete

## 2023-01-11 NOTE — Progress Notes (Signed)
PROGRESS NOTE    Samantha Carpenter  ZOX:[id number] DOB: 22-Apr-1940 DOA: 01/08/2023 PCP: Pcp, No  251A/251A-AA  LOS: 3 days   Brief hospital course:   Assessment & Plan: Samantha Carpenter is a 82 y.o. female with medical history significant of dementia complicated by frequent falls, COPD, chronic diastolic heart failure, CKD stage III A, hypertension, hyperlipidemia who presents to the ED due to a altered mental status.    * Acute hypoxic and hypercapnic respiratory failure (HCC) Likely in the setting of a COPD exacerbation and potentially underlying pneumonia.  On reassessment, patient's tachypnea was noted to be progressively worsening with rates in the 40s, so patient was placed on BiPAP. --weaned off BiPAP and on 2L [place] next morning, however, back on BiPAP for the same tachypnea.   --ABG showed pCO2 61 Plan: --cont BiPAP PRN --Continue supplemental O2 to keep sats >=90%, wean as tolerated - Management of COPD and CAP as noted below  COPD with acute exacerbation (HCC) Given increased work of breathing, shortness of breath and hypoxia, started tx as a COPD exacerbation. - Started prednisone 40 mg and scheduled DuoNebs.  Switched to IV solumedrol due to not being able to take oral while on BiPAP. Plan: --cont IV solumedrol 40 mg daily --DuoNeb scheduled QID  CAP (community acquired pneumonia) On chest x-ray, there is a possible retrocardiac opacity and given elevated WBC and reported shortness of breath, started on tx for community-acquired pneumonia.  Procal 0.72. --cont ceftriaxone and azithromycin  Acute on Chronic diastolic CHF (congestive heart failure) (HCC) --due to no improvement in respiratory status, and BNP 992, repeat CXR showing vascular congestion, started tx with diuresis. --IV lasix 40 BID  Dementia with behavioral disturbance [facility]) Hospital delirium Patient has been increasingly altered per SNF over the last couple days, likely due to underlying infection and  respiratory symptoms.   --of note, pt has many sedative medications on home med list Plan: - taper home sedatives, starting with Klonopin - Delirium precautions --IV haldol PRN  CKD stage 3a, GFR 45-59 ml/min (HCC) Per chart review, patient has a history of CKD stage IIIa.  Her creatinine seems to fluctuate quite a bit between 0.8 and 1.2, currently 1.19. --monitor while diuresing  HTN --cont bisoprolol, losartan and spironolactone  Prediabetes Last A1c of 6.1% approximately 1 year ago. - A1c 5.8  Falls frequently Likely in the setting of dementia and physical deconditioning. - PT/OT  Hypokalemia --monitor and supplement PRN   DVT prophylaxis: Lovenox SQ Code Status: DNR  Family Communication:  Level of care: Progressive Dispo:   The patient is from: ALF Anticipated d/c is to: SNF Anticipated d/c date is: >3 days   Subjective and Interval History:  Pt was off BiPAP and sleeping this morning, did not respond to questions or commands.   Objective: Vitals:   01/11/23 0606 01/11/23 0851 01/11/23 1304 01/11/23 1535  BP: (!) 117/43 (!) 174/62 (!) 153/58   Pulse: 70 82 74   Resp: (!) 21  (!) 21   Temp:  98.5 F (36.9 C) 99 F (37.2 C) 98.1 F (36.7 C)  TempSrc:   Oral Oral  SpO2: 96% 97% 93%   Weight:      Height:        Intake/Output Summary (Last 24 hours) at 01/11/2023 1538 Last data filed at 01/11/2023 1427 Gross per 24 hour  Intake 353 ml  Output 400 ml  Net -47 ml   Filed Weights   01/08/23 1223  Weight: 68 kg  Examination:   Constitutional: NAD, somnolent CV: No cyanosis.   RESP: normal respiratory effort, on 4L Extremities: No effusions, edema in BLE SKIN: warm, dry   Data Reviewed: I have personally reviewed labs and imaging studies  Time spent: 50 minutes  Darlin Priestly, MD [facility] If 7PM-7AM, please contact night-coverage 01/11/2023, 3:38 PM

## 2023-01-11 NOTE — Progress Notes (Signed)
Occupational Therapy Treatment Patient Details Name: Samantha Carpenter MRN: [id number] DOB: [birth year] Today's Date: 01/11/2023   History of present illness Patient is a 82 year old female coming [place] with fall, altered mental status. Found to have acute hypoxic respiratory failure with COPD exacerbation. History of dementia, frequent falls, COPD, chronic diastolic heart failure, CKD stage III A, hypertension, hyperlipidemia.   OT comments  Pt is supine in bed on arrival. Lethargic, but arousable to verb and tact cues. Pt able to follow one step commands with cueing throughout session. No reports of pain. Required Mod A for bed mobility and Mod A x2 for STS from EOB for extended standing period. She remains unsteady and needing Min to Mod A x2 to maintain standing balance and took a few lateral steps to [facility] with Mod A x2 via HHA x2. Linens changed as bed was soiled. Vitals WFL throughout with 02 of 91-93%.  Pt returned to bed with all needs in place and will cont to require skilled acute OT services to maximize her safety and IND to return to PLOF. SNF recommendation remains appropriate.        If plan is discharge home, recommend the following:  A lot of help with walking and/or transfers;A lot of help with bathing/dressing/bathroom;Supervision due to cognitive status;Direct supervision/assist for medications management;Assistance with cooking/housework;Help with stairs or ramp for entrance   Equipment Recommendations  Other (comment) (defer)    Recommendations for Other Services      Precautions / Restrictions Precautions Precautions: Fall Restrictions Weight Bearing Restrictions: No       Mobility Bed Mobility Overal bed mobility: Needs Assistance Bed Mobility: Supine to Sit, Sit to Supine     Supine to sit: Mod assist Sit to supine: Mod assist   General bed mobility comments: required cue for initiation and technique    Transfers Overall transfer level: Needs  assistance Equipment used: 2 person hand held assist Transfers: Sit to/from Stand Sit to Stand: Mod assist, +2 physical assistance           General transfer comment: Mod A x2 required to raise and lower back to bed     Balance Overall balance assessment: Needs assistance Sitting-balance support: Feet supported Sitting balance-Leahy Scale: Poor Sitting balance - Comments: inital posterior lean with sitting, then progressed to SBA with increased time Postural control: Posterior lean Standing balance support: Bilateral upper extremity supported Standing balance-Leahy Scale: Poor Standing balance comment: Min A- Mod A +2 person assistance                           ADL either performed or assessed with clinical judgement   ADL Overall ADL's : Needs assistance/impaired                                     Functional mobility during ADLs: Moderate assistance;+2 for physical assistance;+2 for safety/equipment General ADL Comments: Mod A x2 for a few lateral steps by bed    Extremity/Trunk Assessment Upper Extremity Assessment Upper Extremity Assessment: Generalized weakness   Lower Extremity Assessment Lower Extremity Assessment: Generalized weakness        Vision       Perception     Praxis      Cognition Arousal: Lethargic Behavior During Therapy: Flat affect Overall Cognitive Status: History of cognitive impairments - at baseline  General Comments: difficulty with following single step commands without multimodal cues        Exercises      Shoulder Instructions       General Comments Sp02 91-93% after mobility on 4 L02.    Pertinent Vitals/ Pain       Pain Assessment Pain Assessment: No/denies pain  Home Living                                          Prior Functioning/Environment              Frequency  Min 1X/week        Progress Toward  Goals  OT Goals(current goals can now be found in the care plan section)  Progress towards OT goals: Progressing toward goals  Acute Rehab OT Goals Patient Stated Goal: improve strength OT Goal Formulation: With patient Time For Goal Achievement: 01/23/23 Potential to Achieve Goals: Fair  Plan      Co-evaluation    PT/OT/SLP Co-Evaluation/Treatment: Yes Reason for Co-Treatment: Complexity of the patient's impairments (multi-system involvement) PT goals addressed during session: Mobility/safety with mobility OT goals addressed during session: ADL's and self-care      AM-PAC OT "6 Clicks" Daily Activity     Outcome Measure   Help from another person eating meals?: A Little Help from another person taking care of personal grooming?: A Little Help from another person toileting, which includes using toliet, bedpan, or urinal?: A Lot Help from another person bathing (including washing, rinsing, drying)?: A Lot Help from another person to put on and taking off regular upper body clothing?: A Lot Help from another person to put on and taking off regular lower body clothing?: A Lot 6 Click Score: 14    End of Session Equipment Utilized During Treatment: Gait belt;Oxygen  OT Visit Diagnosis: Unsteadiness on feet (R26.81);Other abnormalities of gait and mobility (R26.89);Repeated falls (R29.6);Muscle weakness (generalized) (M62.81);History of falling (Z91.81)   Activity Tolerance Patient tolerated treatment well   Patient Left in bed;with call bell/phone within reach;with bed alarm set   Nurse Communication          Time: 7829-5621 OT Time Calculation (min): 25 min  Charges: OT General Charges $OT Visit: 1 Visit OT Treatments $Therapeutic Activity: 8-22 mins  Samantha Carpenter, OTR/L  01/11/23, 3:00 PM Estiven Kohan E Katy Brickell 01/11/2023, 2:57 PM

## 2023-01-12 ENCOUNTER — Encounter: Payer: Self-pay | Admitting: Internal Medicine

## 2023-01-12 ENCOUNTER — Inpatient Hospital Stay: Payer: Medicare HMO

## 2023-01-12 DIAGNOSIS — J9601 Acute respiratory failure with hypoxia: Secondary | ICD-10-CM | POA: Diagnosis not present

## 2023-01-12 LAB — CBC
HCT: 40.9 % (ref 36.0–46.0)
Hemoglobin: 12.7 g/dL (ref 12.0–15.0)
MCH: 31 pg (ref 26.0–34.0)
MCHC: 31.1 g/dL (ref 30.0–36.0)
MCV: 99.8 fL (ref 80.0–100.0)
Platelets: 451 10*3/uL — ABNORMAL HIGH (ref 150–400)
RBC: 4.1 MIL/uL (ref 3.87–5.11)
RDW: 13.6 % (ref 11.5–15.5)
WBC: 11.2 10*3/uL — ABNORMAL HIGH (ref 4.0–10.5)
nRBC: 0 % (ref 0.0–0.2)

## 2023-01-12 LAB — BASIC METABOLIC PANEL
Anion gap: 12 (ref 5–15)
BUN: 37 mg/dL — ABNORMAL HIGH (ref 8–23)
CO2: 37 mmol/L — ABNORMAL HIGH (ref 22–32)
Calcium: 9.2 mg/dL (ref 8.9–10.3)
Chloride: 101 mmol/L (ref 98–111)
Creatinine, Ser: 1.06 mg/dL — ABNORMAL HIGH (ref 0.44–1.00)
GFR, Estimated: 52 mL/min — ABNORMAL LOW (ref >60)
Glucose, Bld: 144 mg/dL — ABNORMAL HIGH (ref 70–99)
Potassium: 3.4 mmol/L — ABNORMAL LOW (ref 3.5–5.1)
Sodium: 150 mmol/L — ABNORMAL HIGH (ref 135–145)

## 2023-01-12 LAB — D-DIMER, QUANTITATIVE: D-Dimer, Quant: 2.12 ug{FEU}/mL — ABNORMAL HIGH (ref 0.00–0.50)

## 2023-01-12 MED ORDER — IPRATROPIUM-ALBUTEROL 0.5-2.5 (3) MG/3ML IN SOLN
3.0000 mL | Freq: Two times a day (BID) | RESPIRATORY_TRACT | Status: DC
  Administered 2023-01-12 – 2023-01-17 (×10): 3 mL via RESPIRATORY_TRACT
  Filled 2023-01-12 (×10): qty 3

## 2023-01-12 MED ORDER — POTASSIUM CHLORIDE CRYS ER 20 MEQ PO TBCR
40.0000 meq | EXTENDED_RELEASE_TABLET | Freq: Once | ORAL | Status: AC
  Administered 2023-01-12: 40 meq via ORAL
  Filled 2023-01-12: qty 2

## 2023-01-12 MED ORDER — IOHEXOL 350 MG/ML SOLN
75.0000 mL | Freq: Once | INTRAVENOUS | Status: AC | PRN
  Administered 2023-01-12: 75 mL via INTRAVENOUS

## 2023-01-12 MED ORDER — ORAL CARE MOUTH RINSE: 15.0000 mL | OROMUCOSAL | Status: DC | PRN

## 2023-01-12 MED ORDER — FUROSEMIDE 10 MG/ML IJ SOLN
40.0000 mg | Freq: Two times a day (BID) | INTRAMUSCULAR | Status: AC
  Administered 2023-01-12 (×2): 40 mg via INTRAVENOUS
  Filled 2023-01-12 (×2): qty 4

## 2023-01-12 MED ORDER — SODIUM CHLORIDE 0.9 % IV SOLN
2.0000 g | Freq: Once | INTRAVENOUS | Status: AC
  Administered 2023-01-12: 2 g via INTRAVENOUS
  Filled 2023-01-12: qty 20

## 2023-01-12 NOTE — Progress Notes (Signed)
   01/12/23 0645  Assess: MEWS Score  Temp 98.1 F (36.7 C)  BP (!) 205/60  MAP (mmHg) 101  Pulse Rate 77  ECG Heart Rate 78  Resp (!) 34  SpO2 98 %  O2 Device Bi-PAP  FiO2 (%) 35 %  Assess: MEWS Score  MEWS Temp 0  MEWS Systolic 2  MEWS Pulse 0  MEWS RR 2  MEWS LOC 1  MEWS Score 5  MEWS Score Color Red  Assess: if the MEWS score is Yellow or Red  Were vital signs accurate and taken at a resting state? Yes  Does the patient meet 2 or more of the SIRS criteria? No  Does the patient have a confirmed or suspected source of infection? Yes  MEWS guidelines implemented  Yes, red  Treat  MEWS Interventions Considered administering scheduled or prn medications/treatments as ordered  Take Vital Signs  Increase Vital Sign Frequency  Red: Q1hr x2, continue Q4hrs until patient remains green for 12hrs  Escalate  MEWS: Escalate Red: Discuss with charge nurse and notify provider. Consider notifying RRT. If remains red for 2 hours consider need for higher level of care  Notify: Charge Nurse/RN  Name of Charge Nurse/RN Notified Mila Homer, RN  Provider Notification  Provider Name/Title Manuela Schwartz, NP  Date Provider Notified 01/12/23  Time Provider Notified [phone]  Method of Notification Page (Secure Chat)  Notification Reason Change in status  Provider response No new orders  Date of Provider Response 01/12/23  Time of Provider Response 0654  Notify: Rapid Response  Name of Rapid Response RN Notified Newman Pies, RN  Date Rapid Response Notified 01/12/23  Time Rapid Response Notified 0654  Assess: SIRS CRITERIA  SIRS Temperature  0  SIRS Pulse 0  SIRS Respirations  1  SIRS WBC 0  SIRS Score Sum  1

## 2023-01-12 NOTE — Progress Notes (Signed)
PROGRESS NOTE    Samantha Carpenter  IHK:[id number] DOB: Nov 05, 1940 DOA: 01/08/2023 PCP: Pcp, No  251A/251A-AA  LOS: 4 days   Brief hospital course:   Assessment & Plan: Samantha Carpenter is a 82 y.o. female with medical history significant of dementia complicated by frequent falls, COPD, chronic diastolic heart failure, CKD stage III A, hypertension, hyperlipidemia who presents to the ED due to a altered mental status.    * Acute hypoxic and hypercapnic respiratory failure (HCC) Likely in the setting of a COPD exacerbation and potentially underlying pneumonia.  On reassessment, patient's tachypnea was noted to be progressively worsening with rates in the 40s, so patient was placed on BiPAP. --weaned off BiPAP and on 2L [place] next morning, however, on and off back on BiPAP for the same tachypnea.   --ABG showed pCO2 61 Plan: --cont BiPAP PRN --Continue supplemental O2 to keep sats >=90%, wean as tolerated - Management of COPD and CAP as noted below --CTA chest today for PE study  COPD with acute exacerbation (HCC) Given increased work of breathing, shortness of breath and hypoxia, started tx as a COPD exacerbation. - Started prednisone 40 mg and scheduled DuoNebs.  Switched to IV solumedrol due to not being able to take oral while on BiPAP. Plan: --cont IV solumedrol 40 mg daily --DuoNeb scheduled  CAP (community acquired pneumonia) On chest x-ray, there is a possible retrocardiac opacity and given elevated WBC and reported shortness of breath, started on tx for community-acquired pneumonia.  Procal 0.72. --cont ceftriaxone and azithro  Acute on Chronic diastolic CHF (congestive heart failure) (HCC) --due to no improvement in respiratory status, and BNP 992, repeat CXR showing vascular congestion, started tx with diuresis. --cont IV lasix BID  Dementia with behavioral disturbance [facility]) Hospital delirium Patient has been increasingly altered per SNF over the last couple days, likely due  to underlying infection and respiratory symptoms.   --of note, pt has many sedative medications on home med list Plan: - taper home sedatives, starting with Klonopin - Delirium precautions --IV haldol PRN  CKD stage 3a, GFR 45-59 ml/min (HCC) Per chart review, patient has a history of CKD stage IIIa.  Her creatinine seems to fluctuate quite a bit between 0.8 and 1.2, currently 1.19. --monitor while diuresing  HTN --cont bisoprolol, losartan and spironolactone  Prediabetes Last A1c of 6.1% approximately 1 year ago. - A1c 5.8  Falls frequently Likely in the setting of dementia and physical deconditioning. - PT/OT  Hypokalemia --monitor and supplement PRN  Hypernatremia Likely due to free water deficit. --water by oral route for now   DVT prophylaxis: Lovenox SQ Code Status: DNR  Family Communication:  Level of care: Progressive Dispo:   The patient is from: ALF Anticipated d/c is to: SNF Anticipated d/c date is: >3 days   Subjective and Interval History:  Per RN, pt ate about 25% when fed.  Pt said she didn't feel good, but couldn't give specifics.     Objective: Vitals:   01/12/23 0645 01/12/23 0700 01/12/23 0736 01/12/23 1201  BP: (!) 205/60 (!) 139/40 (!) 159/48 (!) 138/103  Pulse: 77 62 72 77  Resp: (!) 34 (!) 23    Temp: 98.1 F (36.7 C)  (!) 97.4 F (36.3 C) 98.8 F (37.1 C)  TempSrc: Axillary  Axillary Oral  SpO2: 98% 96% 93% 96%  Weight:      Height:        Intake/Output Summary (Last 24 hours) at 01/12/2023 1550 Last data filed at 01/12/2023  1242 Gross per 24 hour  Intake 710 ml  Output 800 ml  Net -90 ml   Filed Weights   01/08/23 1223  Weight: 68 kg    Examination:   Constitutional: NAD, alert, not oriented HEENT: conjunctivae and lids normal, EOMI CV: No cyanosis.   RESP: increased RR, shallow breaths, on 3L SKIN: warm, dry   Data Reviewed: I have personally reviewed labs and imaging studies  Time spent: 50 minutes  Darlin Priestly, MD [facility] If 7PM-7AM, please contact night-coverage 01/12/2023, 3:50 PM

## 2023-01-12 NOTE — Plan of Care (Signed)
  Problem: Activity: Goal: Ability to tolerate increased activity will improve Outcome: Progressing   Problem: Respiratory: Goal: Ability to maintain a clear airway will improve Outcome: Progressing Goal: Levels of oxygenation will improve Outcome: Progressing Goal: Ability to maintain adequate ventilation will improve Outcome: Progressing   Problem: Activity: Goal: Ability to tolerate increased activity will improve Outcome: Progressing   Problem: Clinical Measurements: Goal: Ability to maintain a body temperature in the normal range will improve Outcome: Progressing   Problem: Respiratory: Goal: Ability to maintain adequate ventilation will improve Outcome: Progressing Goal: Ability to maintain a clear airway will improve Outcome: Progressing   Problem: Clinical Measurements: Goal: Ability to maintain clinical measurements within normal limits will improve Outcome: Progressing Goal: Will remain free from infection Outcome: Progressing Goal: Diagnostic test results will improve Outcome: Progressing Goal: Respiratory complications will improve Outcome: Progressing Goal: Cardiovascular complication will be avoided Outcome: Progressing   Problem: Activity: Goal: Risk for activity intolerance will decrease Outcome: Progressing   Problem: Nutrition: Goal: Adequate nutrition will be maintained Outcome: Progressing   Problem: Coping: Goal: Level of anxiety will decrease Outcome: Progressing   Problem: Elimination: Goal: Will not experience complications related to urinary retention Outcome: Progressing   Problem: Pain Management: Goal: General experience of comfort will improve Outcome: Progressing   Problem: Safety: Goal: Ability to remain free from injury will improve Outcome: Progressing   Problem: Skin Integrity: Goal: Risk for impaired skin integrity will decrease Outcome: Progressing   Pt resting comfortably in bed on 2L [place]. Opens eyes to voice. Hx of  dementia, disoriented x4. Safety mitts in place appropriate. Unable to educate pt or discuss POC as documented in education and care plan.

## 2023-01-12 NOTE — Progress Notes (Signed)
   01/11/23 2305  Assess: MEWS Score  Temp 97.8 F (36.6 C)  BP (!) 193/70  MAP (mmHg) 106  Pulse Rate 69  ECG Heart Rate 70  Resp (!) 24  SpO2 100 %  O2 Device Nasal Cannula  O2 Flow Rate (L/min) 3 L/min  Assess: MEWS Score  MEWS Temp 0  MEWS Systolic 0  MEWS Pulse 0  MEWS RR 1  MEWS LOC 1  MEWS Score 2  MEWS Score Color Yellow  Assess: if the MEWS score is Yellow or Red  Were vital signs accurate and taken at a resting state? Yes  Does the patient meet 2 or more of the SIRS criteria? No  Does the patient have a confirmed or suspected source of infection? Yes  MEWS guidelines implemented  Yes, yellow  Treat  MEWS Interventions Considered administering scheduled or prn medications/treatments as ordered  Take Vital Signs  Increase Vital Sign Frequency  Yellow: Q2hr x1, continue Q4hrs until patient remains green for 12hrs  Escalate  MEWS: Escalate Yellow: Discuss with charge nurse and consider notifying provider and/or RRT  Notify: Charge Nurse/RN  Name of Charge Nurse/RN Notified Tiffany, RN  Provider Notification  Provider Name/Title Manuela Schwartz, NP  Date Provider Notified 01/11/23  Time Provider Notified 2309  Method of Notification Page (Secure caht)  Notification Reason Change in status  Provider response See new orders  Date of Provider Response 01/12/23  Time of Provider Response 2315  Assess: SIRS CRITERIA  SIRS Temperature  0  SIRS Pulse 0  SIRS Respirations  1  SIRS WBC 0  SIRS Score Sum  1

## 2023-01-13 DIAGNOSIS — Z515 Encounter for palliative care: Secondary | ICD-10-CM | POA: Diagnosis not present

## 2023-01-13 DIAGNOSIS — J441 Chronic obstructive pulmonary disease with (acute) exacerbation: Secondary | ICD-10-CM | POA: Diagnosis not present

## 2023-01-13 DIAGNOSIS — J9601 Acute respiratory failure with hypoxia: Secondary | ICD-10-CM | POA: Diagnosis not present

## 2023-01-13 DIAGNOSIS — J189 Pneumonia, unspecified organism: Secondary | ICD-10-CM | POA: Diagnosis not present

## 2023-01-13 LAB — BASIC METABOLIC PANEL
Anion gap: 9 (ref 5–15)
BUN: 35 mg/dL — ABNORMAL HIGH (ref 8–23)
CO2: 39 mmol/L — ABNORMAL HIGH (ref 22–32)
Calcium: 8.8 mg/dL — ABNORMAL LOW (ref 8.9–10.3)
Chloride: 102 mmol/L (ref 98–111)
Creatinine, Ser: 1.04 mg/dL — ABNORMAL HIGH (ref 0.44–1.00)
GFR, Estimated: 53 mL/min — ABNORMAL LOW (ref >60)
Glucose, Bld: 146 mg/dL — ABNORMAL HIGH (ref 70–99)
Potassium: 3.9 mmol/L (ref 3.5–5.1)
Sodium: 150 mmol/L — ABNORMAL HIGH (ref 135–145)

## 2023-01-13 LAB — CBC
HCT: 39.2 % (ref 36.0–46.0)
Hemoglobin: 12 g/dL (ref 12.0–15.0)
MCH: 30.7 pg (ref 26.0–34.0)
MCHC: 30.6 g/dL (ref 30.0–36.0)
MCV: 100.3 fL — ABNORMAL HIGH (ref 80.0–100.0)
Platelets: 405 10*3/uL — ABNORMAL HIGH (ref 150–400)
RBC: 3.91 MIL/uL (ref 3.87–5.11)
RDW: 13.8 % (ref 11.5–15.5)
WBC: 8.2 10*3/uL (ref 4.0–10.5)
nRBC: 0 % (ref 0.0–0.2)

## 2023-01-13 LAB — PROCALCITONIN: Procalcitonin: 0.21 ng/mL

## 2023-01-13 LAB — CULTURE, BLOOD (ROUTINE X 2)
Culture: NO GROWTH
Culture: NO GROWTH

## 2023-01-13 LAB — MAGNESIUM: Magnesium: 2.1 mg/dL (ref 1.7–2.4)

## 2023-01-13 MED ORDER — FREE WATER
200.0000 mL | Freq: Four times a day (QID) | Status: DC
  Administered 2023-01-13 – 2023-01-23 (×30): 200 mL via ORAL

## 2023-01-13 MED ORDER — FUROSEMIDE 10 MG/ML IJ SOLN
40.0000 mg | Freq: Two times a day (BID) | INTRAMUSCULAR | Status: DC
  Administered 2023-01-13 – 2023-01-14 (×3): 40 mg via INTRAVENOUS
  Filled 2023-01-13 (×3): qty 4

## 2023-01-13 NOTE — Progress Notes (Signed)
PROGRESS NOTE    Samantha Carpenter  ZOX:[id number] DOB: 06/23/1940 DOA: 01/08/2023 PCP: Pcp, No  251A/251A-AA  LOS: 5 days   Brief hospital course:   Assessment & Plan: Samantha Carpenter is a 82 y.o. female with medical history significant of dementia complicated by frequent falls, COPD, chronic diastolic heart failure, CKD stage III A, hypertension, hyperlipidemia who presents to the ED due to a altered mental status.    * Acute hypoxic and hypercapnic respiratory failure (HCC) Likely in the setting of a COPD exacerbation and potentially underlying pneumonia.  On reassessment, patient's tachypnea was noted to be progressively worsening with rates in the 40s, so patient was placed on BiPAP. --weaned off BiPAP and on 2L [place] next morning, however, on and off back on BiPAP for the same tachypnea.   --ABG showed pCO2 61.  CTA neg for PE. Plan: --cont BiPAP PRN --Continue supplemental O2 to keep sats >=90%, wean as tolerated - Management of COPD and CAP as noted below  COPD with acute exacerbation (HCC) Given increased work of breathing, shortness of breath and hypoxia, started tx as a COPD exacerbation. - Started prednisone 40 mg and scheduled DuoNebs.  Switched to IV solumedrol due to not being able to take oral while on BiPAP. Plan: --cont IV solumedrol 40 mg daily --DuoNeb scheduled  CAP (community acquired pneumonia) On chest x-ray, there is a possible retrocardiac opacity and given elevated WBC and reported shortness of breath, started on tx for community-acquired pneumonia.  Procal 0.72. --completed 5 days of ceftriaxone and azithro --CTA chest showed worsening infiltrate, however, procal trending down, WBC normalized, and O2 requirement stable.  Will hold further abx.  Acute on Chronic diastolic CHF (congestive heart failure) (HCC) --due to no improvement in respiratory status, and BNP 992, repeat CXR showing vascular congestion, started tx with diuresis. --cont IV lasix 40  BID  Dementia with behavioral disturbance [facility]) Hospital delirium Patient has been increasingly altered per SNF over the last couple days, likely due to underlying infection and respiratory symptoms.   --of note, pt has many sedative medications on home med list Plan: - taper home sedatives, starting with Klonopin - Delirium precautions --IV haldol PRN  CKD stage 3a, GFR 45-59 ml/min (HCC) Per chart review, patient has a history of CKD stage IIIa.  Her creatinine seems to fluctuate quite a bit between 0.8 and 1.2, currently 1.19. --stable while diuresing  HTN --BP varied widely --cont bisoprolol, losartan and spironolactone --IV hydralazine PRN  Prediabetes Last A1c of 6.1% approximately 1 year ago. - A1c 5.8  Falls frequently Likely in the setting of dementia and physical deconditioning. - PT/OT  Hypokalemia --monitor and supplement PRN  Hypernatremia Likely due to free water deficit.   --schedule free water intake via oral   DVT prophylaxis: Lovenox SQ Code Status: DNR  Family Communication:  Level of care: Progressive Dispo:   The patient is from: ALF Anticipated d/c is to: SNF Anticipated d/c date is: >3 days   Subjective and Interval History:  Per RN, pt ate about 25% when fed.  Pt said she didn't feel good, but couldn't give specifics.     Objective: Vitals:   01/13/23 0415 01/13/23 0730 01/13/23 0738 01/13/23 1131  BP: (!) 120/42 (!) 104/35  (!) 189/64  Pulse: 61 60  75  Resp: 19 19  20   Temp: 98.2 F (36.8 C) 98.6 F (37 C)  99.3 F (37.4 C)  TempSrc: Oral Oral  Oral  SpO2: 96% 96% 96% 100%  Weight:      Height:        Intake/Output Summary (Last 24 hours) at 01/13/2023 1405 Last data filed at 01/13/2023 0730 Gross per 24 hour  Intake 240 ml  Output 750 ml  Net -510 ml   Filed Weights   01/08/23 1223  Weight: 68 kg    Examination:   Constitutional: NAD, alert, not oriented HEENT: conjunctivae and lids normal, EOMI CV: No  cyanosis.   RESP: increased RR, shallow breaths, on 3L SKIN: warm, dry   Data Reviewed: I have personally reviewed labs and imaging studies  Time spent: 50 minutes  Darlin Priestly, MD [facility] If 7PM-7AM, please contact night-coverage 01/13/2023, 2:05 PM

## 2023-01-13 NOTE — Consult Note (Signed)
Consultation Note Date: 01/13/2023   Patient Name: Samantha Carpenter  DOB: 03/14/1941  MRN: [id number]  Age / Sex: 82 y.o., female  PCP: Pcp, No Referring Physician: Darlin Priestly, MD  Reason for Consultation: Establishing goals of care   HPI/Brief Hospital Course: 82 y.o. female  with past medical history of dementia with frequent falls, COPD, dCHF, CKD stage 3a, HTN, HLD admitted from [facility] on 01/08/2023 with AMS with associated SHOB and cough.  Admitted and being treated for COPD exacerbation with underlying pneumonia as well as CHF exacerbation.  Has required intermittent BiPAP support  Palliative medicine was consulted for assisting with goals of care conversations  Subjective:  Extensive chart review has been completed prior to meeting patient including labs, vital signs, imaging, progress notes, orders, and available advanced directive documents from current and previous encounters.  Visited with Samantha Carpenter at her bedside.  She is awake, alert, able to nod appropriately to simple questions but unable to engage in goals of care conversations due to underlying dementia.  She has currently been weaned off the BiPAP and is on 3 L nasal cannula.  Called and spoke with legal guardian Samantha Carpenter Samantha Carpenter's sister.  Introduced myself as a Publishing rights manager as a member of the palliative care team. Explained palliative medicine is specialized medical care for people living with serious illness. It focuses on providing relief from the symptoms and stress of a serious illness. The goal is to improve quality of life for both the patient and the family.   Samantha Carpenter shares Samantha Carpenter currently resides in a memory care unit at [facility], she has been smiling has been a resident there for quite some time and they are both pleased with the care she receives.  Samantha Carpenter shares at baseline Ms. mildly requires minimal assistance with ADLs and typically ambulates without assistance.   Samantha Carpenter shares she is likely unable to visit Samantha Carpenter at the hospital as she is currently recovering from hip surgery herself.  Samantha Carpenter shares she just recently received a call from the hospital provider who shared Samantha Carpenter has made significant improvement in [place] feels as though she has an understanding of Ms. Sanon current condition.  Samantha Carpenter shares she wishes for continued improvement and for Ms. Rando to return to [facility].  Samantha Carpenter is aware Ms. Harke will likely need short-term rehab to regain strength and mobility prior to returning to previous environment.  Samantha Carpenter shares her hesitations of Ms. Bex going to short-term rehab as this contributes to her worsening confusion due to underlying dementia.  Discussed the role of outpatient palliative care providers with Samantha Carpenter being an extra layer of support for Ms. Fender.  At this time Samantha Carpenter unsure if she feels this is appropriate and would like more time to discuss with her daughter.  Samantha Carpenter does agree to [facility] provider following up early next week.  I discussed importance of continued conversations with family/support persons and all members of their medical team regarding overall plan of care and treatment options ensuring decisions are in alignment with patients goals of care.  All questions/concerns addressed.  PMT will continue to follow and support patient as needed.  Objective: Primary Diagnoses: Present on Admission:  Acute hypoxic respiratory failure (HCC)  COPD with acute exacerbation (HCC)  Dementia with behavioral disturbance (HCC)  Chronic diastolic CHF (congestive heart failure) (HCC)  HTN, goal below 140/80  Prediabetes  CKD stage 3a, GFR 45-59 ml/min (HCC)   Vital Signs: BP (!) 164/58 (BP Location: Right Arm)  Pulse 71   Temp 99.4 F (37.4 C) (Oral)   Resp 20   Ht 5\' 4"  (1.626 m)   Wt 68 kg   SpO2 (!) 86%   BMI 25.75 kg/m  Pain Scale: PAINAD      IO: Intake/output summary:  Intake/Output Summary (Last 24  hours) at 01/13/2023 1735 Last data filed at 01/13/2023 1432 Gross per 24 hour  Intake 480 ml  Output 800 ml  Net -320 ml    LBM: Last BM Date :  (PTA) Baseline Weight: Weight: 68 kg Most recent weight: Weight: 68 kg       Assessment and Plan  SUMMARY OF RECOMMENDATIONS   Time for outcomes Introduced outpatient palliative care to sister/guardian-will need follow-up PMT to continue to follow for ongoing needs and support  Palliative Prophylaxis:   Bowel Regimen, Delirium Protocol and Frequent Pain Assessment  Thank you for this consult and allowing Palliative Medicine to participate in the care of [facility]. Palliative medicine will continue to follow and assist as needed.   Time Total: 55 minutes  Time spent includes: Detailed review of medical records (labs, imaging, vital signs), medically appropriate exam (mental status, respiratory, cardiac, skin), discussed with treatment team, counseling and educating patient, family and staff, documenting clinical information, medication management and coordination of care.   Signed by: Leeanne Deed, DNP, AGNP-C Palliative Medicine    Please contact Palliative Medicine Team phone at [phone] for questions and concerns.  For individual provider: See Loretha Stapler

## 2023-01-13 NOTE — Plan of Care (Signed)

## 2023-01-14 DIAGNOSIS — J9601 Acute respiratory failure with hypoxia: Secondary | ICD-10-CM | POA: Diagnosis not present

## 2023-01-14 LAB — CBC
HCT: 43.9 % (ref 36.0–46.0)
Hemoglobin: 13.2 g/dL (ref 12.0–15.0)
MCH: 30.6 pg (ref 26.0–34.0)
MCHC: 30.1 g/dL (ref 30.0–36.0)
MCV: 101.9 fL — ABNORMAL HIGH (ref 80.0–100.0)
Platelets: 474 10*3/uL — ABNORMAL HIGH (ref 150–400)
RBC: 4.31 MIL/uL (ref 3.87–5.11)
RDW: 13.8 % (ref 11.5–15.5)
WBC: 11.7 10*3/uL — ABNORMAL HIGH (ref 4.0–10.5)
nRBC: 0 % (ref 0.0–0.2)

## 2023-01-14 LAB — BASIC METABOLIC PANEL
Anion gap: 12 (ref 5–15)
BUN: 38 mg/dL — ABNORMAL HIGH (ref 8–23)
CO2: 41 mmol/L — ABNORMAL HIGH (ref 22–32)
Calcium: 9.1 mg/dL (ref 8.9–10.3)
Chloride: 97 mmol/L — ABNORMAL LOW (ref 98–111)
Creatinine, Ser: 1.34 mg/dL — ABNORMAL HIGH (ref 0.44–1.00)
GFR, Estimated: 39 mL/min — ABNORMAL LOW (ref >60)
Glucose, Bld: 193 mg/dL — ABNORMAL HIGH (ref 70–99)
Potassium: 3.7 mmol/L (ref 3.5–5.1)
Sodium: 150 mmol/L — ABNORMAL HIGH (ref 135–145)

## 2023-01-14 LAB — MAGNESIUM: Magnesium: 2.2 mg/dL (ref 1.7–2.4)

## 2023-01-14 NOTE — Progress Notes (Signed)
PROGRESS NOTE    Samantha Carpenter  YNW:[id number] DOB: [birth year] DOA: 01/08/2023 PCP: Pcp, No  251A/251A-AA  LOS: 6 days   Brief hospital course:   Assessment & Plan: Samantha Carpenter is a 82 y.o. female with medical history significant of dementia complicated by frequent falls, COPD, chronic diastolic heart failure, CKD stage III A, hypertension, hyperlipidemia who presents to the ED due to a altered mental status.    * Acute hypoxic and hypercapnic respiratory failure (HCC) Likely in the setting of a COPD exacerbation and potentially underlying pneumonia.  On reassessment, patient's tachypnea was noted to be progressively worsening with rates in the 40s, so patient was placed on BiPAP. --weaned off BiPAP and on 2L [place] next morning, however, on and off back on BiPAP for the same tachypnea.   --ABG showed pCO2 61.  CTA neg for PE. --respiratory status improved today, on 2L [place] with no distress. Plan: --Continue supplemental O2 to keep sats >=92%, wean as tolerated - Management of COPD and CAP as noted below  COPD with acute exacerbation (HCC) Given increased work of breathing, shortness of breath and hypoxia, started tx as a COPD exacerbation. - Started prednisone 40 mg and scheduled DuoNebs.  Switched to IV solumedrol due to not being able to take oral while on BiPAP. Plan: --d/c IV solumedrol today --DuoNeb scheduled  CAP (community acquired pneumonia) On chest x-ray, there is a possible retrocardiac opacity and given elevated WBC and reported shortness of breath, started on tx for community-acquired pneumonia.  Procal 0.72. --completed 5 days of ceftriaxone and azithro --CTA chest showed worsening infiltrate, however, procal trending down, WBC normalized, and O2 requirement stable.  Will hold further abx.  Acute on Chronic diastolic CHF (congestive heart failure) (HCC) --due to no improvement in respiratory status, and BNP 992, repeat CXR showing vascular congestion, started tx with  diuresis with IV lasix 40 BID. --lasix 40 x1 today due to Cr bump  Dementia with behavioral disturbance [facility]) Hospital delirium Patient has been increasingly altered per SNF over the last couple days, likely due to underlying infection and respiratory symptoms.   --of note, pt has many sedative medications on home med list Plan: - taper home sedatives, starting with Klonopin - Delirium precautions --IV haldol PRN  CKD stage 3a, GFR 45-59 ml/min (HCC) Per chart review, patient has a history of CKD stage IIIa.  Her creatinine seems to fluctuate quite a bit between 0.8 and 1.2, currently 1.19. --stable while diuresing  HTN --BP varied widely --cont bisoprolol, losartan and spironolactone --IV hydralazine PRN  Prediabetes Last A1c of 6.1% approximately 1 year ago. - A1c 5.8  Falls frequently Likely in the setting of dementia and physical deconditioning. - PT/OT  Hypokalemia --monitor and supplement PRN  Hypernatremia Likely due to free water deficit.   --free water 200 ml q6h   DVT prophylaxis: Lovenox SQ Code Status: DNR  Family Communication:  Level of care: Progressive Dispo:   The patient is from: ALF Anticipated d/c is to: SNF Anticipated d/c date is: 1-2 days   Subjective and Interval History:  Pt was up getting help eating meal.   Objective: Vitals:   01/14/23 0100 01/14/23 0500 01/14/23 0600 01/14/23 0832  BP: (!) 106/43 (!) 187/74 97/63 (!) 134/55  Pulse: 66 74  85  Resp: 16     Temp: 98.3 F (36.8 C) 98.1 F (36.7 C)  98.1 F (36.7 C)  TempSrc: Axillary Axillary  Oral  SpO2: 99% 99%  100%  Weight:  Height:        Intake/Output Summary (Last 24 hours) at 01/14/2023 1646 Last data filed at 01/14/2023 1458 Gross per 24 hour  Intake 480 ml  Output 200 ml  Net 280 ml   Filed Weights   01/08/23 1223  Weight: 68 kg    Examination:   Constitutional: NAD, alert, not oriented, sitting at edge of bed HEENT: conjunctivae and lids normal,  EOMI CV: No cyanosis.   RESP: normal respiratory effort, no obvious adventitious lung sounds, on 2L Extremities: No effusions, edema in BLE SKIN: warm, dry   Data Reviewed: I have personally reviewed labs and imaging studies  Time spent: 35 minutes  Darlin Priestly, MD [facility] If 7PM-7AM, please contact night-coverage 01/14/2023, 4:46 PM

## 2023-01-14 NOTE — Progress Notes (Signed)
Occupational Therapy Treatment Patient Details Name: Samantha Carpenter MRN: [id number] DOB: 02-Oct-1940 Today's Date: 01/14/2023   History of present illness Patient is a 82 year old female coming [facility] [place] with fall, altered mental status. Found to have acute hypoxic respiratory failure with COPD exacerbation. History of dementia, frequent falls, COPD, chronic diastolic heart failure, CKD stage III A, hypertension, hyperlipidemia.   OT comments  Pt is supine in bed on arrival. Pt does not appear to be in pain. Pt performed bed mobility with Mod to Max A today. She was able to sit at EOB x30 mins with SBA, no UE support, bil feet on floor for self feeding tasks. Pt able to scoop food on utensils 50% of the time, but typically is not able to get food to mouth without spilling d/t tremors. Pt overall requiring Max A for self feeding tasks. Pt demo ability to drink from cup with Min A to Max A. Cueing provided throughout, however pt continues to have difficulty following commands and requires increased time for all feeding. Pt with occasional coughing during session, but 02 sats WFL on 3L. HR up to 132 with seated EOB, therefore opted to return to supine to finish self feeding. Pt demo STS from EOB with Mod A x1 via HHA to sit closer to [facility]. HR decreased to 77 once in supine. NT called to finish assisting pt with lunch.  Pt left with all needs in place and will cont to require skilled acute OT services to maximize her safety and ability to return to PLOF.       If plan is discharge home, recommend the following:  A lot of help with walking and/or transfers;A lot of help with bathing/dressing/bathroom;Supervision due to cognitive status;Direct supervision/assist for medications management;Assistance with cooking/housework;Help with stairs or ramp for entrance   Equipment Recommendations  Other (comment) (defer)    Recommendations for Other Services      Precautions / Restrictions  Precautions Precautions: Fall Restrictions Weight Bearing Restrictions: No       Mobility Bed Mobility Overal bed mobility: Needs Assistance Bed Mobility: Supine to Sit, Sit to Supine     Supine to sit: Mod assist, Used rails, HOB elevated Sit to supine: Max assist, Used rails, HOB elevated   General bed mobility comments: cognition limiting ability to follow directions, therfore pt needing Mod A to sit at EOB via assist for trunk and BLEs and Max A to return to bed    Transfers Overall transfer level: Needs assistance Equipment used: 1 person hand held assist Transfers: Sit to/from Stand Sit to Stand: Mod assist           General transfer comment: Mod A via HHA x1 for pt to perform STS and sit closer to [facility] prior to returning to supine     Balance Overall balance assessment: Needs assistance Sitting-balance support: Feet supported Sitting balance-Leahy Scale: Fair Sitting balance - Comments: sat at EOB x30 mins during self feeding and MD assessment       Standing balance comment: Mod A x1 HHA for standing at EOB x5 seconds                           ADL either performed or assessed with clinical judgement   ADL Overall ADL's : Needs assistance/impaired Eating/Feeding: Maximal assistance;Sitting;Cueing for sequencing;Cueing for safety  Functional mobility during ADLs: Moderate assistance      Extremity/Trunk Assessment Upper Extremity Assessment Upper Extremity Assessment: Generalized weakness   Lower Extremity Assessment Lower Extremity Assessment: Generalized weakness        Vision       Perception     Praxis      Cognition Arousal: Lethargic Behavior During Therapy: Flat affect Overall Cognitive Status: History of cognitive impairments - at baseline                                 General Comments: difficulty with following single step commands without multimodal cues         Exercises      Shoulder Instructions       General Comments      Pertinent Vitals/ Pain       Pain Assessment Pain Assessment: Faces Faces Pain Scale: No hurt Pain Intervention(s): Monitored during session  Home Living                                          Prior Functioning/Environment              Frequency  Min 1X/week        Progress Toward Goals  OT Goals(current goals can now be found in the care plan section)  Progress towards OT goals: Progressing toward goals  Acute Rehab OT Goals Patient Stated Goal: improve confusion to regain strength OT Goal Formulation: With patient Time For Goal Achievement: 01/23/23 Potential to Achieve Goals: Fair  Plan      Co-evaluation                 AM-PAC OT "6 Clicks" Daily Activity     Outcome Measure   Help from another person eating meals?: A Lot Help from another person taking care of personal grooming?: A Lot Help from another person toileting, which includes using toliet, bedpan, or urinal?: Total Help from another person bathing (including washing, rinsing, drying)?: Total Help from another person to put on and taking off regular upper body clothing?: A Lot Help from another person to put on and taking off regular lower body clothing?: Total 6 Click Score: 9    End of Session Equipment Utilized During Treatment: Oxygen  OT Visit Diagnosis: Unsteadiness on feet (R26.81);Other abnormalities of gait and mobility (R26.89);Repeated falls (R29.6);Muscle weakness (generalized) (M62.81);History of falling (Z91.81)   Activity Tolerance Patient tolerated treatment well   Patient Left in bed;with call bell/phone within reach;with nursing/sitter in room   Nurse Communication Mobility status        Time: 9147-8295 OT Time Calculation (min): 41 min  Charges: OT General Charges $OT Visit: 1 Visit OT Treatments $Self Care/Home Management : 23-37 mins $Therapeutic Activity:  8-22 mins  Carlie Solorzano, OTR/L  01/14/23, 1:28 PM  Samantha Carpenter 01/14/2023, 1:24 PM

## 2023-01-14 NOTE — Progress Notes (Signed)
Physical Therapy Treatment Patient Details Name: Samantha Carpenter MRN: [id number] DOB: [birth year] Today's Date: 01/14/2023   History of Present Illness Patient is a 82 year old female coming [facility] [place] with fall, altered mental status. Found to have acute hypoxic respiratory failure with COPD exacerbation. History of dementia, frequent falls, COPD, chronic diastolic heart failure, CKD stage III A, hypertension, hyperlipidemia.    PT Comments  Pt was pleasant and motivated to participate during the session and put forth good effort throughout. Pt only oriented to name. She is Min A to sit up in bed, but needing +2 mod A to be laid back in bed. She was able to perform x3 STS's from lowered EOB surface with min A +2 via HHA, Initial stand was ~30 seconds before pt abruptly sitting back down, when asked pt giving vague reports of feeling weak. When standing pt able to shuffle a few steps towards [facility], with no true foot clearance noted despite VC's for steps and sequencing. Further STS's performed were less and less time, with final STS only lasting ~10 sec, likely 2/2 BLE weakness. SpO2 and HR were monitored throughout session, remaining WFL on 3L O2. Pt will benefit from continued PT services upon discharge to safely address deficits listed in patient problem list for decreased caregiver assistance and eventual return to PLOF.    If plan is discharge home, recommend the following: A lot of help with walking and/or transfers;A lot of help with bathing/dressing/bathroom;Assist for transportation;Help with stairs or ramp for entrance;Assistance with cooking/housework;Direct supervision/assist for medications management;Direct supervision/assist for financial management;Supervision due to cognitive status   Can travel by private vehicle     No  Equipment Recommendations  Other (comment) (TBD)    Recommendations for Other Services       Precautions / Restrictions Precautions Precautions:  Fall Restrictions Weight Bearing Restrictions: No     Mobility  Bed Mobility Overal bed mobility: Needs Assistance Bed Mobility: Supine to Sit, Sit to Supine     Supine to sit: Min assist Sit to supine: Mod assist, +2 for physical assistance   General bed mobility comments: Pt sitting up with Min A for adjustments to sit fully upright, overall good initiation    Transfers Overall transfer level: Needs assistance Equipment used: 2 person hand held assist Transfers: Sit to/from Stand Sit to Stand: +2 physical assistance, Min assist           General transfer comment: Pt able to perform x3 STS's with overall good ascent and strength. Initial stand she was able to stay up for ~30 seconds, with each STS afters being shorter durations.    Ambulation/Gait Ambulation/Gait assistance: +2 physical assistance, Min assist Gait Distance (Feet): 3 Feet Assistive device: 2 person hand held assist Gait Pattern/deviations: Shuffle Gait velocity: decreased     General Gait Details: Pt able to shuffle a few steps towards HOB, with mod VC's for sequencing and foot placement   Stairs             Wheelchair Mobility     Tilt Bed    Modified Rankin (Stroke Patients Only)       Balance Overall balance assessment: Needs assistance Sitting-balance support: Feet supported Sitting balance-Leahy Scale: Fair Sitting balance - Comments: stable, but has instances of reaching impulsively, needing slight assist for correct     Standing balance-Leahy Scale: Poor Standing balance comment: Min A +2 person assistance  Cognition Arousal: Lethargic Behavior During Therapy: Flat affect Overall Cognitive Status: History of cognitive impairments - at baseline                                 General Comments: difficulty with following single step commands without multimodal cues. Pt slightly impulsive when sitting EOB,  reaching/leaning forwards for items that are fay away        Exercises      General Comments        Pertinent Vitals/Pain Pain Assessment Pain Assessment: Faces Faces Pain Scale: No hurt    Home Living                          Prior Function            PT Goals (current goals can now be found in the care plan section)      Frequency    Min 1X/week      PT Plan      Co-evaluation              AM-PAC PT "6 Clicks" Mobility   Outcome Measure  Help needed turning from your back to your side while in a flat bed without using bedrails?: A Little Help needed moving from lying on your back to sitting on the side of a flat bed without using bedrails?: A Little Help needed moving to and from a bed to a chair (including a wheelchair)?: Total Help needed standing up from a chair using your arms (e.g., wheelchair or bedside chair)?: Total Help needed to walk in hospital room?: Total Help needed climbing 3-5 steps with a railing? : Total 6 Click Score: 10    End of Session Equipment Utilized During Treatment: Oxygen (3L O2) Activity Tolerance: Patient tolerated treatment well;Patient limited by fatigue Patient left: in bed;with call bell/phone within reach;with bed alarm set;with nursing/sitter in room Nurse Communication: Mobility status (bed change need(PT assisted)) PT Visit Diagnosis: Muscle weakness (generalized) (M62.81);Unsteadiness on feet (R26.81)     Time: 1308-6578 PT Time Calculation (min) (ACUTE ONLY): 29 min  Charges:                            Cecile Sheerer, SPT 01/14/23, 12:53 PM

## 2023-01-14 NOTE — TOC Progression Note (Signed)
Transition of Care [facility]) - Progression Note    Patient Details  Name: Samantha Carpenter MRN: [id number] Date of Birth: Nov 13, 1940  Transition of [facility] CM/SW Contact  Darolyn Rua, [place] Phone Number: 01/14/2023, 10:36 AM  Clinical Narrative:     Per MD patient not medically ready to dc to [facility] today, CSW has updated [facility] with [facility].   Pending medical readiness to discharge to [facility] once stable.   Expected Discharge Plan: Skilled Nursing Facility Barriers to Discharge: Continued Medical Work up  Expected Discharge Plan and Services       Living arrangements for the past 2 months: Assisted Living Facility                                       Social Determinants of Health (SDOH) Interventions SDOH Screenings   Food Insecurity: Patient Unable To Answer (01/11/2023)  Housing: Patient Unable To Answer (01/11/2023)  Transportation Needs: No Transportation Needs (11/07/2022)  Utilities: Not At Risk (11/07/2022)  Financial Resource Strain: Low Risk  (09/02/2018)  Physical Activity: Sufficiently Active (09/02/2018)  Social Connections: Unknown (09/02/2018)  Stress: No Stress Concern Present (09/02/2018)  Tobacco Use: Medium Risk (01/08/2023)    Readmission Risk Interventions    01/11/2022    2:05 PM 01/09/2022    2:23 PM  Readmission Risk Prevention Plan  Transportation Screening  Complete  HRI or Home Care Consult Complete   Palliative Care Screening  Not Applicable  Medication Review (RN Care Manager)  Complete

## 2023-01-15 DIAGNOSIS — J189 Pneumonia, unspecified organism: Secondary | ICD-10-CM | POA: Diagnosis not present

## 2023-01-15 DIAGNOSIS — J441 Chronic obstructive pulmonary disease with (acute) exacerbation: Secondary | ICD-10-CM | POA: Diagnosis not present

## 2023-01-15 DIAGNOSIS — I5032 Chronic diastolic (congestive) heart failure: Secondary | ICD-10-CM | POA: Diagnosis not present

## 2023-01-15 DIAGNOSIS — J9601 Acute respiratory failure with hypoxia: Secondary | ICD-10-CM | POA: Diagnosis not present

## 2023-01-15 LAB — BASIC METABOLIC PANEL
Anion gap: 10 (ref 5–15)
BUN: 49 mg/dL — ABNORMAL HIGH (ref 8–23)
CO2: 40 mmol/L — ABNORMAL HIGH (ref 22–32)
Calcium: 8.7 mg/dL — ABNORMAL LOW (ref 8.9–10.3)
Chloride: 98 mmol/L (ref 98–111)
Creatinine, Ser: 1.29 mg/dL — ABNORMAL HIGH (ref 0.44–1.00)
GFR, Estimated: 41 mL/min — ABNORMAL LOW (ref >60)
Glucose, Bld: 150 mg/dL — ABNORMAL HIGH (ref 70–99)
Potassium: 3.5 mmol/L (ref 3.5–5.1)
Sodium: 148 mmol/L — ABNORMAL HIGH (ref 135–145)

## 2023-01-15 LAB — BLOOD GAS, ARTERIAL
Acid-Base Excess: 23.7 mmol/L — ABNORMAL HIGH (ref 0.0–2.0)
Bicarbonate: 50.6 mmol/L — ABNORMAL HIGH (ref 20.0–28.0)
O2 Content: 2 L/min
O2 Saturation: 91.7 %
Patient temperature: 37
pCO2 arterial: 62 mm[Hg] — ABNORMAL HIGH (ref 32–48)
pH, Arterial: 7.52 — ABNORMAL HIGH (ref 7.35–7.45)
pO2, Arterial: 57 mm[Hg] — ABNORMAL LOW (ref 83–108)

## 2023-01-15 MED ORDER — CLONAZEPAM 0.125 MG PO TBDP
0.1250 mg | ORAL_TABLET | Freq: Three times a day (TID) | ORAL | Status: DC
  Administered 2023-01-15 – 2023-01-24 (×27): 0.125 mg via ORAL
  Filled 2023-01-15 (×28): qty 1

## 2023-01-15 MED ORDER — HALOPERIDOL LACTATE 5 MG/ML IJ SOLN
2.0000 mg | Freq: Four times a day (QID) | INTRAMUSCULAR | Status: DC | PRN
  Administered 2023-01-16 – 2023-01-24 (×13): 2 mg via INTRAMUSCULAR
  Filled 2023-01-15 (×13): qty 1

## 2023-01-15 NOTE — Progress Notes (Addendum)
Palliative Care Progress Note, Assessment & Plan   Patient Name: Samantha Carpenter       Date: 01/15/2023 DOB: January 06, 1941  Age: 82 y.o. MRN#: [id number] Attending Physician: Darlin Priestly, MD Primary Care Physician: Pcp, No Admit Date: 01/08/2023  Subjective: Patient is sitting up in bed in no apparent distress.  Nasal cannula is in place.  She is awake and acknowledges my presence.  She makes vocalizations but her thoughts are nonlinear.  She is able to confirm that she is not in any pain or distress at this time.  No family or friends present during my visit.  HPI: 82 y.o. female  with past medical history of dementia with frequent falls, COPD, dCHF, CKD stage 3a, HTN, HLD admitted from [facility] on 01/08/2023 with AMS with associated SHOB and cough.   Admitted and being treated for COPD exacerbation with underlying pneumonia as well as CHF exacerbation.  Has required intermittent BiPAP support   Palliative medicine was consulted for assisting with goals of care conversations  Summary of counseling/coordination of care: Extensive chart review completed prior to meeting patient including labs, vital signs, imaging, progress notes, orders, and available advanced directive documents from current and previous encounters.   After reviewing the patient's chart and assessing the patient at bedside, I attempted to speak with the patient in regards to plan and goals of care.  She is unable to answer simple questions about her current medical situation.  She is unable to participate in goals of care or complex medical decision making independently at this time.  After assessing the patient, I spoke with her legal guardian/sister [name] over the phone.  Medical update given.    [name] shares that visitors have noted that  patient does not recognize them any longer.  [name] conveys patient has good and bad days.  She is hopeful that once patient has left the hospital then she will have more of these good days than bad.    However, [name] is very concerned about the care she believes patient will receive at [facility].  She shares she wants the patient to go there to get rehab but also knows that the patient does much better in her familiar environment at the [facility] [facility].  She wants what best is for the patient and believes that it is rehab but is grateful that it is only for a short amount of time.  Therapeutic silence, active listening, and emotional support provided.  I highlighted that dementia is a chronic, progressive, and irreversible disease that is often exacerbated by acute hospitalizations and illnesses.  [name] shares she knows that her sister might not ever be the same but is hopeful that she can at least be happy and comfortable wherever she goes.  In light of patient transferring to an outside medical facility, I introduced the concept of a MOST form.  Reviewed that making patient patient's wishes known and outlining her boundaries of care ensure upholding patient's values and wishes prior to any urgent or emergent event.  [name] shares she would like review the paperwork at a later time.  I left a copy of the MOST form at bedside for her to review.  DNR with limited interventions  remains.  Symptom burden remains low.  Goals are clear and plan is set - d/c to [facility] in next 1-2 days.  [name] has PMT contact information and was encouraged to contact PMT with any future acute palliative needs.   PMT will monitor the patient peripherally and shadow her chart. Please re-engage with PMT if goals change, at patient/family's request, or if patient's health deteriorates during hospitalization.    Physical Exam Vitals reviewed.  Constitutional:      General: She is not in acute distress.    Appearance: She  is normal weight.  HENT:     Head: Normocephalic.     Nose: Nose normal.     Mouth/Throat:     Mouth: Mucous membranes are moist.  Eyes:     Pupils: Pupils are equal, round, and reactive to light.  Cardiovascular:     Rate and Rhythm: Normal rate.  Pulmonary:     Effort: Pulmonary effort is normal.  Abdominal:     Palpations: Abdomen is soft.  Skin:    General: Skin is warm and dry.  Neurological:     Mental Status: She is alert.     Comments: Oriented to self  Psychiatric:        Mood and Affect: Mood normal.        Behavior: Behavior normal.             Total Time 35 minutes   Time spent includes: Detailed review of medical records (labs, imaging, vital signs), medically appropriate exam (mental status, respiratory, cardiac, skin), discussed with treatment team, counseling and educating patient, family and staff, documenting clinical information, medication management and coordination of care.  Samara Deist L. Bonita Quin, DNP, FNP-BC Palliative Medicine Team

## 2023-01-15 NOTE — Progress Notes (Signed)
Patient continues to crawl oob and attempt to go over bed rails. She is a huge fall risk. Notified MD

## 2023-01-15 NOTE — Progress Notes (Signed)
PROGRESS NOTE    Samantha Carpenter  AVW:[id number] DOB: Jul 15, 1940 DOA: 01/08/2023 PCP: Pcp, No  [id number]/[id number]-AA  LOS: 7 days   Brief hospital course:   Assessment & Plan: Samantha Carpenter is a 82 y.o. female with medical history significant of dementia complicated by frequent falls, COPD, chronic diastolic heart failure, CKD stage IIIA, hypertension, who presented to the ED due to altered mental status.    * Acute hypoxic and hypercapnic respiratory failure (HCC) Likely in the setting of a COPD exacerbation and potentially underlying pneumonia.  On presentation, patient's tachypnea was noted to be progressively worsening with rates in the 40s, so patient was placed on BiPAP.  ABG showed pCO2 61. CTA neg for PE.  --Pt was on and off BiPAP during early hospitalization, respiratory status finally improved on 10/28, stable on 2L [place] with no distress. Plan: --Continue supplemental O2 to keep sats >=92%, wean as tolerated  COPD with acute exacerbation (HCC) Given increased work of breathing, shortness of breath and hypoxia, started tx as a COPD exacerbation. - Started prednisone 40 mg and scheduled DuoNebs.  Switched to IV solumedrol due to not being able to take oral while on BiPAP. --d/c'ed IV solumedrol after receiving 7 days of steroid. --DuoNeb scheduled  CAP (community acquired pneumonia) On chest x-ray, there is a possible retrocardiac opacity and given elevated WBC and reported shortness of breath, started on tx for community-acquired pneumonia.  Procal 0.72. --completed 5 days of ceftriaxone and azithro --CTA chest showed worsening infiltrate, however, procal trending down, WBC normalized, and O2 requirement stable.  Will hold further abx.  Acute on Chronic diastolic CHF (congestive heart failure) (HCC) --due to no improvement in respiratory status, and BNP 992, repeat CXR showing vascular congestion, started tx with diuresis with IV lasix 40 BID with last dose on 10/28. --hold diuretic  now due to Cr bump  Dementia with behavioral disturbance [facility]) Hospital delirium Patient has been increasingly altered per SNF over the last couple days, likely due to underlying infection and respiratory symptoms.   --of note, pt has many sedative medications on home med list Plan: - taper home sedatives, starting with Klonopin now reduced to 0.125 TID --hold home gabapentin - Delirium precautions --IV haldol PRN  CKD stage 3a, GFR 45-59 ml/min (HCC) Per chart review, patient has a history of CKD stage IIIa.  Her creatinine seems to fluctuate quite a bit between 0.8 and 1.2, currently 1.19.  HTN --BP varied widely --cont bisoprolol, losartan and spironolactone --IV hydralazine PRN  Prediabetes Last A1c of 6.1% approximately 1 year ago. - A1c 5.8  Falls frequently Likely in the setting of dementia and physical deconditioning. - PT/OT  Hypokalemia --monitor and supplement PRN  Hypernatremia --d/c home salt tabs --free water 200 ml q6h --monitor Na  Metabolic alkalosis --unclear etiology.  Bicarb started trending up after presentation, up to 40 this morning.  ABG with pH 7.52 this morning, and pCO2 62. --avoid BiPAP for now   DVT prophylaxis: Lovenox SQ Code Status: DNR  Family Communication:  Level of care: Progressive Dispo:   The patient is from: ALF Anticipated d/c is to: SNF Anticipated d/c date is: whenever bed available   Subjective and Interval History:  RN reported pt less interactive this morning.  ABG obtained with pCO2 62 but pH elevated at 7.52.  Pt later became more alert and asked to eat.  Pt denied dyspnea.    Objective: Vitals:   01/15/23 0802 01/15/23 1108 01/15/23 1531 01/15/23 1956  BP: Marland Kitchen)  144/53 (!) 145/50 (!) 131/51 (!) 144/60  Pulse: 60 71 72 71  Resp: 16 16 16 18   Temp: 98.2 F (36.8 C) 98.4 F (36.9 C) (!) 97.4 F (36.3 C) 97.8 F (36.6 C)  TempSrc:    Oral  SpO2: 93% 95% 97% 96%  Weight:      Height:        Intake/Output  Summary (Last 24 hours) at 01/15/2023 2107 Last data filed at 01/15/2023 1729 Gross per 24 hour  Intake 120 ml  Output 500 ml  Net -380 ml   Filed Weights   01/08/23 1223  Weight: 68 kg    Examination:   Constitutional: NAD, alert, not oriented HEENT: conjunctivae and lids normal, EOMI CV: No cyanosis.   RESP: normal respiratory effort, on 2L Extremities: No effusions, edema in BLE SKIN: warm, dry   Data Reviewed: I have personally reviewed labs and imaging studies  Time spent: 50 minutes  Darlin Priestly, MD [facility] If 7PM-7AM, please contact night-coverage 01/15/2023, 9:07 PM

## 2023-01-15 NOTE — Plan of Care (Signed)
Progressing towards goals

## 2023-01-16 DIAGNOSIS — F03918 Unspecified dementia, unspecified severity, with other behavioral disturbance: Secondary | ICD-10-CM | POA: Diagnosis not present

## 2023-01-16 DIAGNOSIS — J9601 Acute respiratory failure with hypoxia: Secondary | ICD-10-CM | POA: Diagnosis not present

## 2023-01-16 LAB — BASIC METABOLIC PANEL
Anion gap: 10 (ref 5–15)
BUN: 46 mg/dL — ABNORMAL HIGH (ref 8–23)
CO2: 40 mmol/L — ABNORMAL HIGH (ref 22–32)
Calcium: 9 mg/dL (ref 8.9–10.3)
Chloride: 99 mmol/L (ref 98–111)
Creatinine, Ser: 1.1 mg/dL — ABNORMAL HIGH (ref 0.44–1.00)
GFR, Estimated: 50 mL/min — ABNORMAL LOW (ref >60)
Glucose, Bld: 164 mg/dL — ABNORMAL HIGH (ref 70–99)
Potassium: 3.3 mmol/L — ABNORMAL LOW (ref 3.5–5.1)
Sodium: 149 mmol/L — ABNORMAL HIGH (ref 135–145)

## 2023-01-16 LAB — CBC
HCT: 42.4 % (ref 36.0–46.0)
Hemoglobin: 13.1 g/dL (ref 12.0–15.0)
MCH: 30.9 pg (ref 26.0–34.0)
MCHC: 30.9 g/dL (ref 30.0–36.0)
MCV: 100 fL (ref 80.0–100.0)
Platelets: 419 10*3/uL — ABNORMAL HIGH (ref 150–400)
RBC: 4.24 MIL/uL (ref 3.87–5.11)
RDW: 13.7 % (ref 11.5–15.5)
WBC: 9.6 10*3/uL (ref 4.0–10.5)
nRBC: 0 % (ref 0.0–0.2)

## 2023-01-16 LAB — MAGNESIUM: Magnesium: 2.6 mg/dL — ABNORMAL HIGH (ref 1.7–2.4)

## 2023-01-16 MED ORDER — POTASSIUM CHLORIDE 10 MEQ/100ML IV SOLN
10.0000 meq | INTRAVENOUS | Status: AC
  Administered 2023-01-16 (×3): 10 meq via INTRAVENOUS
  Filled 2023-01-16 (×3): qty 100

## 2023-01-16 NOTE — Care Management Important Message (Signed)
Important Message  Patient Details  Name: Samantha Carpenter MRN: [id number] Date of Birth: February 07, 1941   Important Message Given:  Yes - Medicare IM     Verita Schneiders Masie Bermingham 01/16/2023, 3:33 PM

## 2023-01-16 NOTE — Progress Notes (Addendum)
Physical Therapy Treatment Patient Details Name: Samantha Carpenter MRN: [id number] DOB: June 30, 1940 Today's Date: 01/16/2023   History of Present Illness Patient is a 82 year old female coming [facility] [place] with fall, altered mental status. Found to have acute hypoxic respiratory failure with COPD exacerbation. History of dementia, frequent falls, COPD, chronic diastolic heart failure, CKD stage III A, hypertension, hyperlipidemia.    PT Comments  Pt was pleasant and with cues and encouragement pt willing to participate during the session and put forth good effort throughout. Pt needing consistent redirects 2/2 impulsive behavior during session to maintain on task while participating with therapy. Pt continues to need mod-max A for bed mobility and to perform single STS during session. Once sitting up pt able to tolerate ~5 min with intermittent R sided lean, needing min A to correct alongside VC's for upright posture. She performed STS with RW needing Mod A +2, pt has good initial boost but needing assist to stand fully upright. Heavy cues provided to have pt advance either LE but pt made no attempts to take steps. Pt declined any further attempts at standing or taking steps once seated back down. Unable to gather SpO2 values due to poor pleth despite multiple attempts throughout session, though no SOB noted, and pt had no reports of lightheadedness or dizziness. Pt will benefit from continued PT services upon discharge to safely address deficits listed in patient problem list for decreased caregiver assistance and eventual return to PLOF.      If plan is discharge home, recommend the following: A lot of help with walking and/or transfers;A lot of help with bathing/dressing/bathroom;Assist for transportation;Help with stairs or ramp for entrance;Assistance with cooking/housework;Direct supervision/assist for medications management;Direct supervision/assist for financial management;Supervision due to cognitive  status   Can travel by private vehicle     No  Equipment Recommendations  Other (comment) (TBD)    Recommendations for Other Services       Precautions / Restrictions Precautions Precautions: Fall Restrictions Weight Bearing Restrictions: No     Mobility  Bed Mobility Overal bed mobility: Needs Assistance Bed Mobility: Supine to Sit, Sit to Supine     Supine to sit: Mod assist, Used rails, HOB elevated Sit to supine: Max assist, Used rails, HOB elevated   General bed mobility comments: Able to show initiation with heavy multimodal cues, ultimately needing asssit to perform, increasing assist to get back in bed    Transfers Overall transfer level: Needs assistance Equipment used: Rolling walker (2 wheels) Transfers: Sit to/from Stand Sit to Stand: Mod assist, +2 physical assistance           General transfer comment: Multimodal cues to attempt stand, good initial boost but needing asssit to stand fully upright.    Ambulation/Gait         Gait velocity: decreased     General Gait Details: Pt declined, Continued multi modal cues for advancing LE's were given but pt made no changes or initiaion with task for steps.   Stairs             Wheelchair Mobility     Tilt Bed    Modified Rankin (Stroke Patients Only)       Balance Overall balance assessment: Needs assistance Sitting-balance support: Feet supported Sitting balance-Leahy Scale: Fair Sitting balance - Comments: tolerate sitting EOB for ~5 min total, light R sided lean needing intermittent support to correct   Standing balance support: Bilateral upper extremity supported Standing balance-Leahy Scale: Poor Standing balance comment: static  with RW                            Cognition Arousal: Alert Behavior During Therapy: Flat affect, Impulsive Overall Cognitive Status: History of cognitive impairments - at baseline                                  General Comments: difficulty with following single step commands without multimodal cues        Exercises      General Comments General comments (skin integrity, edema, etc.): Pt's Unable to gather SpO2 2/2 poor pleth with multiple attempts, though no SOB or pt reports of dizziness or lightneadedness      Pertinent Vitals/Pain Pain Assessment Pain Assessment: Faces Faces Pain Scale: No hurt Pain Intervention(s): Monitored during session    Home Living                          Prior Function            PT Goals (current goals can now be found in the care plan section)      Frequency    Min 1X/week      PT Plan      Co-evaluation              AM-PAC PT "6 Clicks" Mobility   Outcome Measure  Help needed turning from your back to your side while in a flat bed without using bedrails?: A Lot Help needed moving from lying on your back to sitting on the side of a flat bed without using bedrails?: A Lot Help needed moving to and from a bed to a chair (including a wheelchair)?: Total Help needed standing up from a chair using your arms (e.g., wheelchair or bedside chair)?: A Lot Help needed to walk in hospital room?: Total Help needed climbing 3-5 steps with a railing? : Total 6 Click Score: 9    End of Session Equipment Utilized During Treatment: Oxygen Activity Tolerance: Patient tolerated treatment well;Patient limited by fatigue Patient left: in bed;with call bell/phone within reach;with bed alarm set;with family/visitor present Nurse Communication: Mobility status PT Visit Diagnosis: Muscle weakness (generalized) (M62.81);Unsteadiness on feet (R26.81)     Time: 8413-2440 PT Time Calculation (min) (ACUTE ONLY): 22 min  Charges:                            Cecile Sheerer, SPT 01/16/23, 4:56 PM This entire session was performed under direct supervision and direction of a licensed therapist/therapist assistant. I have personally read, edited  and approve of the note as written.  Loran Senters, DPT

## 2023-01-16 NOTE — Progress Notes (Signed)
Palliative Care Progress Note   Patient Name: Samantha Carpenter       Date: 01/16/2023 DOB: 1940-07-06  Age: 82 y.o. MRN#: [id number] Attending Physician: Charise Killian, MD Primary Care Physician: Pcp, No Admit Date: 01/08/2023  Chart reviewed.  Patient assessed with RN at bedside.  MOST form remains at bedside - not completed.Patient is resting comfortably in bed in no apparent distress.   Plan remains for [facility] to [facility] No acute palliative needs at this time.  Thank you for allowing the Palliative Medicine Team to assist in the care of [facility].  Samara Deist L. Bonita Quin, DNP, FNP-BC Palliative Medicine Team  No charge

## 2023-01-16 NOTE — Plan of Care (Signed)
  Problem: Education: Goal: Knowledge of disease or condition will improve Outcome: Progressing Goal: Knowledge of the prescribed therapeutic regimen will improve Outcome: Progressing Goal: Individualized Educational Video(s) Outcome: Progressing   Problem: Activity: Goal: Ability to tolerate increased activity will improve Outcome: Progressing Goal: Will verbalize the importance of balancing activity with adequate rest periods Outcome: Progressing   Problem: Respiratory: Goal: Ability to maintain a clear airway will improve Outcome: Progressing Goal: Levels of oxygenation will improve Outcome: Progressing Goal: Ability to maintain adequate ventilation will improve Outcome: Progressing   Problem: Activity: Goal: Ability to tolerate increased activity will improve Outcome: Progressing   Problem: Clinical Measurements: Goal: Ability to maintain a body temperature in the normal range will improve Outcome: Progressing   Problem: Respiratory: Goal: Ability to maintain adequate ventilation will improve Outcome: Progressing Goal: Ability to maintain a clear airway will improve Outcome: Progressing   Problem: Nutrition: Goal: Adequate nutrition will be maintained Outcome: Progressing   Problem: Pain Management: Goal: General experience of comfort will improve Outcome: Progressing

## 2023-01-16 NOTE — Progress Notes (Signed)
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 Continue on supplemental oxygen and wean as tolerated. Was on BiPAP but has since been weaned off   COPD exacerbation: completed abx & steroid course. Bronchodilators prn    CAP: completed abx course. Bronchodilators prn.    Acute on chronic diastolic CHF: holding lasix. Monitor I/Os.    Dementia with behavioral disturbance: continue w/ supportive care. Haldol prn. Tapering home klonopin    CKDIIIa: Cr is labile. Avoid nephrotoxic meds   HTN: continue on losartan, aldactone, bisoprolol. IV hydralazine prn    Prediabetes: HbA1c 5.8. Unable to comprehend pre-DM education.    Falls frequently: likely secondary to dementia. PT/OT recs SNF    Hypokalemia: potassium given    Hypernatremia: continue w/ free water Q6h    Metabolic alkalosis: etiology unclear. Stable from day prior      DVT prophylaxis: lovenox  Code Status: DNR Family Communication:  Disposition Plan: needs SNF placement.  Level of care: Progressive Status is: Inpatient Remains inpatient appropriate because: needs SNF placement     Consultants:    Procedures:  Antimicrobials:   Subjective: Pt is pleasantly confused   Objective: Vitals:   01/15/23 2104 01/16/23 0400 01/16/23 0520 01/16/23 0849  BP:  (!) 181/69 (!) 142/34 (!) 103/41  Pulse:   90 81  Resp:   18 (!) 24  Temp:   97.6 F (36.4 C) 97.7 F (36.5 C)   TempSrc:   Oral   SpO2: 95%  100% 97%  Weight:      Height:        Intake/Output Summary (Last 24 hours) at 01/16/2023 0904 Last data filed at 01/16/2023 0523 Gross per 24 hour  Intake 240 ml  Output 350 ml  Net -110 ml   Filed Weights   01/08/23 1223  Weight: 68 kg    Examination:  General exam: Appears calm and comfortable  Respiratory system: decreased breath sounds Cardiovascular system: S1 & S2 +. No  rubs, gallops or clicks.  Gastrointestinal system: Abdomen is nondistended, soft and nontender. Hypoactive bowel sounds heard. Central nervous system: Alert and awake Psychiatry: Judgement and insight appears poor. Flat mood and affect    Data Reviewed: I have personally reviewed following labs and imaging studies  CBC: Recent Labs  Lab 01/11/23 0435 01/12/23 0632 01/13/23 0439 01/14/23 0547 01/16/23 0501  WBC 5.5 11.2* 8.2 11.7* 9.6  HGB 11.4* 12.7 12.0 13.2 13.1  HCT 36.0 40.9 39.2 43.9 42.4  MCV 97.3 99.8 100.3* 101.9* 100.0  PLT 374 451* 405* 474* 419*   Basic Metabolic Panel: Recent Labs  Lab 01/11/23 0435 01/12/23 0632 01/13/23 0439 01/14/23 0547 01/15/23 0551 01/16/23 0501  NA 147* 150* 150* 150* 148* 149*  K 4.0 3.4* 3.9 3.7 3.5 3.3*  CL 101 101 102 97* 98 99  CO2 34* 37* 39* 41* 40* 40*  GLUCOSE 197* 144* 146* 193* 150* 164*  BUN 36* 37* 35* 38* 49*  46*  CREATININE 1.10* 1.06* 1.04* 1.34* 1.29* 1.10*  CALCIUM 9.1 9.2 8.8* 9.1 8.7* 9.0  MG 2.4  --  2.1 2.2  --  2.6*   GFR: Estimated Creatinine Clearance: 36.7 mL/min (A) (by C-G formula based on SCr of 1.1 mg/dL (H)). Liver Function Tests: No results for input(s): "AST", "ALT", "ALKPHOS", "BILITOT", "PROT", "ALBUMIN" in the last 168 hours. No results for input(s): "LIPASE", "AMYLASE" in the last 168 hours. No results for input(s): "AMMONIA" in the last 168 hours. Coagulation Profile: No results for input(s): "INR", "PROTIME" in the last 168 hours. Cardiac Enzymes: No results for  input(s): "CKTOTAL", "CKMB", "CKMBINDEX", "TROPONINI" in the last 168 hours. BNP (last 3 results) No results for input(s): "PROBNP" in the last 8760 hours. HbA1C: No results for input(s): "HGBA1C" in the last 72 hours. CBG: No results for input(s): "GLUCAP" in the last 168 hours. Lipid Profile: No results for input(s): "CHOL", "HDL", "LDLCALC", "TRIG", "CHOLHDL", "LDLDIRECT" in the last 72 hours. Thyroid Function Tests: No results for input(s): "TSH", "T4TOTAL", "FREET4", "T3FREE", "THYROIDAB" in the last 72 hours. Anemia Panel: No results for input(s): "VITAMINB12", "FOLATE", "FERRITIN", "TIBC", "IRON", "RETICCTPCT" in the last 72 hours. Sepsis Labs: Recent Labs  Lab 01/13/23 0436  PROCALCITON 0.21    Recent Results (from the past 240 hour(s))  Culture, blood (Routine x 2)     Status: None   Collection Time: 01/08/23 12:52 PM   Specimen: BLOOD  Result Value Ref Range Status   Specimen Description BLOOD RIGHT [facility]  Final   Special Requests   Final    BOTTLES DRAWN AEROBIC AND ANAEROBIC Blood Culture results may not be optimal due to an excessive volume of blood received in culture bottles   Culture   Final    NO GROWTH 5 DAYS Performed at [facility], [address] [place], [place] [address]    Report Status 01/13/2023 FINAL  Final  Resp panel by RT-PCR (RSV, Flu A&B, Covid) Anterior Nasal Swab     Status: None   Collection Time: 01/08/23 12:53 PM   Specimen: Anterior Nasal Swab  Result Value Ref Range Status   SARS Coronavirus 2 by RT PCR NEGATIVE NEGATIVE Final    Comment: (NOTE) SARS-CoV-2 target nucleic acids are NOT DETECTED.  The SARS-CoV-2 RNA is generally detectable in upper respiratory specimens during the acute phase of infection. The lowest concentration of SARS-CoV-2 viral copies this assay can detect is 138 copies/mL. A negative result does not preclude SARS-Cov-2 infection and should not be used as the sole basis for treatment or other patient  management decisions. A negative result may occur with  improper specimen collection/handling, submission of specimen other than nasopharyngeal swab, presence of viral mutation(s) within the areas targeted by this assay, and inadequate number of viral copies(<138 copies/mL). A negative result must be combined with clinical observations, patient history, and epidemiological information. The expected result is Negative.  Fact Sheet for Patients:  BloggerCourse.com  Fact Sheet for Healthcare Providers:  SeriousBroker.it  This test is no t yet approved or cleared by the [place] FDA and  has been authorized for detection and/or diagnosis of SARS-CoV-2 by FDA under an Emergency Use Authorization (EUA). This EUA will remain  in effect (meaning this test can be used) for the duration of the COVID-19 declaration under Section 564(b)(1) of the Act, 21 U.S.C.section 360bbb-3(b)(1), unless the authorization is terminated  or revoked sooner.       Influenza A by PCR NEGATIVE NEGATIVE Final  Influenza B by PCR NEGATIVE NEGATIVE Final    Comment: (NOTE) The Xpert Xpress SARS-CoV-2/FLU/RSV plus assay is intended as an aid in the diagnosis of influenza from Nasopharyngeal swab specimens and should not be used as a sole basis for treatment. Nasal washings and aspirates are unacceptable for Xpert Xpress SARS-CoV-2/FLU/RSV testing.  Fact Sheet for Patients: BloggerCourse.com  Fact Sheet for Healthcare Providers: SeriousBroker.it  This test is not yet approved or cleared by the [place] FDA and has been authorized for detection and/or diagnosis of SARS-CoV-2 by FDA under an Emergency Use Authorization (EUA). This EUA will remain in effect (meaning this test can be used) for the duration of the COVID-19 declaration under Section 564(b)(1) of the Act, 21 U.S.C. section 360bbb-3(b)(1),  unless the authorization is terminated or revoked.     Resp Syncytial Virus by PCR NEGATIVE NEGATIVE Final    Comment: (NOTE) Fact Sheet for Patients: BloggerCourse.com  Fact Sheet for Healthcare Providers: SeriousBroker.it  This test is not yet approved or cleared by the [place] FDA and has been authorized for detection and/or diagnosis of SARS-CoV-2 by FDA under an Emergency Use Authorization (EUA). This EUA will remain in effect (meaning this test can be used) for the duration of the COVID-19 declaration under Section 564(b)(1) of the Act, 21 U.S.C. section 360bbb-3(b)(1), unless the authorization is terminated or revoked.  Performed at [facility], [address] [place], [place] [address]   Culture, blood (Routine x 2)     Status: None   Collection Time: 01/08/23 12:55 PM   Specimen: BLOOD  Result Value Ref Range Status   Specimen Description BLOOD LEFT [facility]  Final   Special Requests   Final    BOTTLES DRAWN AEROBIC AND ANAEROBIC Blood Culture results may not be optimal due to an inadequate volume of blood received in culture bottles   Culture   Final    NO GROWTH 5 DAYS Performed at [facility], [address] [place], [place] [address]    Report Status 01/13/2023 FINAL  Final  Respiratory (~20 pathogens) panel by PCR     Status: None   Collection Time: 01/08/23 11:35 PM   Specimen: Nasopharyngeal Swab; Respiratory  Result Value Ref Range Status   Adenovirus NOT DETECTED NOT DETECTED Final   Coronavirus 229E NOT DETECTED NOT DETECTED Final    Comment: (NOTE) The Coronavirus on the Respiratory Panel, DOES NOT test for the novel  Coronavirus (2019 nCoV)    Coronavirus HKU1 NOT DETECTED NOT DETECTED Final   Coronavirus NL63 NOT DETECTED NOT DETECTED Final   Coronavirus OC43 NOT DETECTED NOT DETECTED Final   Metapneumovirus NOT DETECTED NOT DETECTED Final   Rhinovirus / Enterovirus NOT  DETECTED NOT DETECTED Final   Influenza A NOT DETECTED NOT DETECTED Final   Influenza B NOT DETECTED NOT DETECTED Final   Parainfluenza Virus 1 NOT DETECTED NOT DETECTED Final   Parainfluenza Virus 2 NOT DETECTED NOT DETECTED Final   Parainfluenza Virus 3 NOT DETECTED NOT DETECTED Final   Parainfluenza Virus 4 NOT DETECTED NOT DETECTED Final   Respiratory Syncytial Virus NOT DETECTED NOT DETECTED Final   Bordetella pertussis NOT DETECTED NOT DETECTED Final   Bordetella Parapertussis NOT DETECTED NOT DETECTED Final   Chlamydophila pneumoniae NOT DETECTED NOT DETECTED Final   Mycoplasma pneumoniae NOT DETECTED NOT DETECTED Final    Comment: Performed at [facility], [address] [place], [place] [address]  MRSA Next Gen by PCR, Nasal  Status: Abnormal   Collection Time: 01/11/23  7:42 PM   Specimen: Nasal Mucosa; Nasal Swab  Result Value Ref Range Status   MRSA by PCR Next Gen DETECTED (A) NOT DETECTED Final    Comment: RESULT CALLED TO, READ BACK BY AND VERIFIED WITH: [facility] WHITE RN @ 2139 01/11/23 LFD (NOTE) The GeneXpert MRSA Assay (FDA approved for NASAL specimens only), is one component of a comprehensive MRSA colonization surveillance program. It is not intended to diagnose MRSA infection nor to guide or monitor treatment for MRSA infections. Test performance is not FDA approved in patients less than 59 years old. Performed at [facility], [address], [place], [place] [address]          Radiology Studies: No results found.      Scheduled Meds:  aspirin EC  81 mg Oral Daily   bisoprolol  5 mg Oral Daily   clonazepam  0.125 mg Oral TID   DULoxetine  60 mg Oral Daily   enoxaparin (LOVENOX) injection  40 mg Subcutaneous Q24H   famotidine  20 mg Oral Daily   free water  200 mL Oral Q6H   influenza vaccine adjuvanted  0.5 mL Intramuscular Tomorrow-1000   ipratropium-albuterol  3 mL Nebulization BID   losartan  100 mg Oral Daily    mupirocin ointment  1 Application Nasal BID   OLANZapine  2.5 mg Oral BID   OXcarbazepine  150 mg Oral q AM   OXcarbazepine  300 mg Oral QHS   rivastigmine  1.5 mg Oral BID   spironolactone  12.5 mg Oral Daily   Continuous Infusions:   LOS: 8 days      Charise Killian, MD [facility] Pager [phone]  If 7PM-7AM, please contact night-coverage [email or web address]  01/16/2023, 9:04 AM

## 2023-01-17 DIAGNOSIS — J189 Pneumonia, unspecified organism: Secondary | ICD-10-CM | POA: Diagnosis not present

## 2023-01-17 DIAGNOSIS — J9601 Acute respiratory failure with hypoxia: Secondary | ICD-10-CM | POA: Diagnosis not present

## 2023-01-17 DIAGNOSIS — F03918 Unspecified dementia, unspecified severity, with other behavioral disturbance: Secondary | ICD-10-CM | POA: Diagnosis not present

## 2023-01-17 DIAGNOSIS — Z515 Encounter for palliative care: Secondary | ICD-10-CM | POA: Diagnosis not present

## 2023-01-17 LAB — BASIC METABOLIC PANEL
Anion gap: 9 (ref 5–15)
BUN: 45 mg/dL — ABNORMAL HIGH (ref 8–23)
CO2: 39 mmol/L — ABNORMAL HIGH (ref 22–32)
Calcium: 8.6 mg/dL — ABNORMAL LOW (ref 8.9–10.3)
Chloride: 99 mmol/L (ref 98–111)
Creatinine, Ser: 1.3 mg/dL — ABNORMAL HIGH (ref 0.44–1.00)
GFR, Estimated: 41 mL/min — ABNORMAL LOW (ref >60)
Glucose, Bld: 158 mg/dL — ABNORMAL HIGH (ref 70–99)
Potassium: 3.4 mmol/L — ABNORMAL LOW (ref 3.5–5.1)
Sodium: 147 mmol/L — ABNORMAL HIGH (ref 135–145)

## 2023-01-17 MED ORDER — IPRATROPIUM-ALBUTEROL 0.5-2.5 (3) MG/3ML IN SOLN: 3.0000 mL | RESPIRATORY_TRACT | Status: DC | PRN

## 2023-01-17 NOTE — Plan of Care (Signed)
  Problem: Education: Goal: Knowledge of disease or condition will improve Outcome: Progressing Goal: Knowledge of the prescribed therapeutic regimen will improve Outcome: Progressing Goal: Individualized Educational Video(s) Outcome: Progressing   Problem: Activity: Goal: Ability to tolerate increased activity will improve Outcome: Progressing Goal: Will verbalize the importance of balancing activity with adequate rest periods Outcome: Progressing   Problem: Respiratory: Goal: Ability to maintain a clear airway will improve Outcome: Progressing Goal: Levels of oxygenation will improve Outcome: Progressing Goal: Ability to maintain adequate ventilation will improve Outcome: Progressing   Problem: Activity: Goal: Ability to tolerate increased activity will improve Outcome: Progressing   Problem: Clinical Measurements: Goal: Ability to maintain a body temperature in the normal range will improve Outcome: Progressing   Problem: Respiratory: Goal: Ability to maintain adequate ventilation will improve Outcome: Progressing Goal: Ability to maintain a clear airway will improve Outcome: Progressing   Problem: Education: Goal: Knowledge of General Education information will improve Description: Including pain rating scale, medication(s)/side effects and non-pharmacologic comfort measures Outcome: Progressing   Problem: Health Behavior/Discharge Planning: Goal: Ability to manage health-related needs will improve Outcome: Progressing   Problem: Clinical Measurements: Goal: Ability to maintain clinical measurements within normal limits will improve Outcome: Progressing Goal: Will remain free from infection Outcome: Progressing Goal: Diagnostic test results will improve Outcome: Progressing Goal: Respiratory complications will improve Outcome: Progressing Goal: Cardiovascular complication will be avoided Outcome: Progressing   Problem: Activity: Goal: Risk for activity  intolerance will decrease Outcome: Progressing   Problem: Nutrition: Goal: Adequate nutrition will be maintained Outcome: Progressing   Problem: Coping: Goal: Level of anxiety will decrease Outcome: Progressing   Problem: Elimination: Goal: Will not experience complications related to bowel motility Outcome: Progressing Goal: Will not experience complications related to urinary retention Outcome: Progressing   Problem: Pain Management: Goal: General experience of comfort will improve Outcome: Progressing   Problem: Safety: Goal: Ability to remain free from injury will improve Outcome: Progressing   Problem: Skin Integrity: Goal: Risk for impaired skin integrity will decrease Outcome: Progressing   Problem: Safety: Goal: Non-violent Restraint(s) Outcome: Progressing

## 2023-01-17 NOTE — Progress Notes (Signed)
Occupational Therapy Treatment Patient Details Name: Samantha Carpenter MRN: [id number] DOB: 1940/05/23 Today's Date: 01/17/2023   History of present illness Patient is a 82 year old female [place] with fall, altered mental status. Found to have acute hypoxic respiratory failure with COPD exacerbation. History of dementia, frequent falls, COPD, chronic diastolic heart failure, CKD stage III A, hypertension, hyperlipidemia.   OT comments  Pt is supine in bed on arrival. Awake and agreeable to OT session. Mittens removed for session. She c/o pain, but was unable to verbalize where. Mod A needed to get to EOB, Max A to return. Pt tolerated sitting EOB to take morning meds and attempted LB dressing to don socks, no LOB noted, however ultimately needed Max/total assist to perform. Able to wash face with SUP and cueing for initiation. Multiple STS trials performed for linen change d/t bed soiled and pt then noted to have BM. Able to stand with Mod A and Mod A to maintain balance for extended periods of time while nursing performed hygiene with total assist. Pt able to follow more directions today, however not consistently and remains confused with inability to state her name, DOB or location. Pt returned to bed with all needs in place and will cont to require skilled acute OT services to maximize her safety and IND to return to PLOF. SNF remains appropriate recommendation.       If plan is discharge home, recommend the following:  A lot of help with walking and/or transfers;A lot of help with bathing/dressing/bathroom;Supervision due to cognitive status;Direct supervision/assist for medications management;Assistance with cooking/housework;Help with stairs or ramp for entrance   Equipment Recommendations  Other (comment) (defer)    Recommendations for Other Services      Precautions / Restrictions Restrictions Weight Bearing Restrictions: No       Mobility Bed Mobility Overal bed mobility:  Needs Assistance Bed Mobility: Supine to Sit, Sit to Supine     Supine to sit: Mod assist, Used rails, HOB elevated Sit to supine: Max assist, Used rails, HOB elevated   General bed mobility comments: Mod A to get to EOB with verb and tactile cues, then needed Max A d/t cognition to get back into bed after BM    Transfers Overall transfer level: Needs assistance Equipment used: 1 person hand held assist Transfers: Sit to/from Stand Sit to Stand: Mod assist           General transfer comment: MOD A to perform x3 STS from EOB during session for cleaning after BM     Balance Overall balance assessment: Needs assistance Sitting-balance support: Feet supported Sitting balance-Leahy Scale: Fair Sitting balance - Comments: pt sat at EOB x10 min with SBA for safety and no LOB with attempted donning socks   Standing balance support: Bilateral upper extremity supported Standing balance-Leahy Scale: Poor                             ADL either performed or assessed with clinical judgement   ADL Overall ADL's : Needs assistance/impaired     Grooming: Wash/dry face;Bed level;Supervision/safety;Cueing for sequencing Grooming Details (indicate cue type and reason): cueing for intiation then able to perform with SUP             Lower Body Dressing: Maximal assistance;Total assistance;Bed level Lower Body Dressing Details (indicate cue type and reason): Max/total assist for doffing/donning mesh panties at bed level and socks seated EOB  Toileting- Clothing Manipulation and Hygiene: Maximal assistance;Total assistance;Sit to/from stand Toileting - Clothing Manipulation Details (indicate cue type and reason): Max/Total assist for posterior hygiene after BM in standing EOB            Extremity/Trunk Assessment Upper Extremity Assessment Upper Extremity Assessment: Generalized weakness   Lower Extremity Assessment Lower Extremity Assessment: Generalized  weakness        Vision       Perception     Praxis      Cognition Arousal: Alert Behavior During Therapy: Flat affect, Impulsive Overall Cognitive Status: History of cognitive impairments - at baseline                                 General Comments: difficulty with following single step commands without multimodal cues        Exercises      Shoulder Instructions       General Comments      Pertinent Vitals/ Pain       Pain Assessment Pain Assessment: Faces Faces Pain Scale: Hurts a little bit Pain Location: unable to verbalize where Pain Intervention(s): Monitored during session, Limited activity within patient's tolerance  Home Living                                          Prior Functioning/Environment              Frequency  Min 1X/week        Progress Toward Goals  OT Goals(current goals can now be found in the care plan section)  Progress towards OT goals: Progressing toward goals  Acute Rehab OT Goals Patient Stated Goal: improve confusion to regain strength OT Goal Formulation: With patient Time For Goal Achievement: 01/23/23 Potential to Achieve Goals: Fair  Plan      Co-evaluation                 AM-PAC OT "6 Clicks" Daily Activity     Outcome Measure   Help from another person eating meals?: A Lot Help from another person taking care of personal grooming?: A Lot Help from another person toileting, which includes using toliet, bedpan, or urinal?: Total Help from another person bathing (including washing, rinsing, drying)?: Total Help from another person to put on and taking off regular upper body clothing?: A Lot Help from another person to put on and taking off regular lower body clothing?: Total 6 Click Score: 9    End of Session Equipment Utilized During Treatment: Oxygen  OT Visit Diagnosis: Unsteadiness on feet (R26.81);Other abnormalities of gait and mobility (R26.89);Repeated  falls (R29.6);Muscle weakness (generalized) (M62.81);History of falling (Z91.81)   Activity Tolerance Patient tolerated treatment well   Patient Left in bed;with call bell/phone within reach;with nursing/sitter in room;with bed alarm set   Nurse Communication Mobility status        Time: 2595-6387 OT Time Calculation (min): 46 min  Charges: OT General Charges $OT Visit: 1 Visit OT Treatments $Self Care/Home Management : 23-37 mins $Therapeutic Activity: 8-22 mins  Akbar Sacra, OTR/L  01/17/23, 1:48 PM  Samantha Carpenter E Nyshaun Standage 01/17/2023, 1:45 PM

## 2023-01-17 NOTE — TOC Progression Note (Signed)
Transition of Care [facility]) - Progression Note    Patient Details  Name: Samantha Carpenter MRN: [id number] Date of Birth: Oct 18, 1940  [facility] CM/SW Contact  Darolyn Rua, [place] Phone Number: 01/17/2023, 10:36 AM  Clinical Narrative:     Per MD patient with continued medical workup, to consult speech today. Potential to start insurance auth tomorrow, as patient has bed at [facility] but auth has expired due to continued medical workup.   Pending medical readiness to start insurance authorization for planned discharge to [facility].   Expected Discharge Plan: Skilled Nursing Facility Barriers to Discharge: Continued Medical Work up  Expected Discharge Plan and Services       Living arrangements for the past 2 months: Assisted Living Facility                                       Social Determinants of Health (SDOH) Interventions SDOH Screenings   Food Insecurity: Patient Unable To Answer (01/11/2023)  Housing: Patient Unable To Answer (01/11/2023)  Transportation Needs: No Transportation Needs (11/07/2022)  Utilities: Not At Risk (11/07/2022)  Financial Resource Strain: Low Risk  (09/02/2018)  Physical Activity: Sufficiently Active (09/02/2018)  Social Connections: Unknown (09/02/2018)  Stress: No Stress Concern Present (09/02/2018)  Tobacco Use: Medium Risk (01/08/2023)    Readmission Risk Interventions    01/11/2022    2:05 PM 01/09/2022    2:23 PM  Readmission Risk Prevention Plan  Transportation Screening  Complete  HRI or Home Care Consult Complete   Palliative Care Screening  Not Applicable  Medication Review (RN Care Manager)  Complete

## 2023-01-17 NOTE — Evaluation (Signed)
Clinical/Bedside Swallow Evaluation Patient Details  Name: Samantha Carpenter MRN: [id number] Date of Birth: October 19, 1940  Today's Date: 01/17/2023 Time: SLP Start Time (ACUTE ONLY): 1525 SLP Stop Time (ACUTE ONLY): 1620 SLP Time Calculation (min) (ACUTE ONLY): 55 min  Past Medical History:  Past Medical History:  Diagnosis Date   Chronic kidney disease    COPD (chronic obstructive pulmonary disease) (HCC)    Dementia (HCC)    Depression    Hypertension    Liver disease    Past Surgical History:  Past Surgical History:  Procedure Laterality Date   ABDOMINAL HYSTERECTOMY     CHOLECYSTECTOMY     ESOPHAGOGASTRODUODENOSCOPY N/A 11/20/2019   Procedure: ESOPHAGOGASTRODUODENOSCOPY (EGD);  Surgeon: Regis Bill, MD;  Location: [facility] ENDOSCOPY;  Service: Gastroenterology;  Laterality: N/A;   face tuck     HPI:  Per admitting H&P " Samantha Carpenter is a 82 y.o. female with medical history significant of dementia complicated by frequent falls, COPD, chronic diastolic heart failure, CKD stage III A, hypertension, hyperlipidemia who presents to the ED due to a altered mental status.     History is limited by patient's underlying dementia.  She states that she came in today due to a fall but cannot recall the events surrounding the fall or how it happened.  She also endorses shortness of breath with cough and states has been happening for a while now.  When asking any other review of system questions, patient replies yes to each 1 including chest pain, nausea, vomiting, diarrhea, abdominal pain etc.    Assessment / Plan / Recommendation  Clinical Impression    Bedside swallow eval today reveal moderate dysphagia. Pt was awake but not very attentive. Sitter present and supportive reported Pt slept most of the day and recently awakened for some PO intake but was "pocketing" the foods. Noted small amount of mashed potatoes on the tongue prior to ST eval. Oral mech exam revealed structures to be moist and  pink. Pt was not able to consistently follow directions for amore thorough oral mech exam. Pt has an upper partial but when placed in her mouth, she was not able to hold it in place for PO's therefore it was removed and not used during this assessment. Pt tolerated bites of applesauce without s/s of aspiration but needed alternating liquids to clear oral  cavity.Given thick substance like mashed potatoes, Pt had a delayed oral transit and moderate tongue residue. Pt tolerated several sips of thin liquid by straw without s/s of aspiration but then began coughing after sips later in the evaluation. Pt tolerated nectar thick liquids well. Rec Dys 1 diet with nectar thick liquids.moisten dry purees with gravy or thin them a little. ST to follow up with toleration of diet and reassess with thinliquids in hopes of diet upgarde at some point.  SLP Visit Diagnosis: Dysphagia, oropharyngeal phase (R13.12)    Aspiration Risk  Moderate aspiration risk    Diet Recommendation Dysphagia 1 (Puree);Nectar-thick liquid    Medication Administration: Crushed with puree Supervision: Full supervision/cueing for compensatory strategies;Staff to assist with self feeding Compensations: Minimize environmental distractions;Slow rate;Small sips/bites;Follow solids with liquid;Other (Comment) (moisten or thin  out any thick or dry purees) Postural Changes: Seated upright at 90 degrees;Remain upright for at least 30 minutes after po intake    Other  Recommendations      Recommendations for follow up therapy are one component of a multi-disciplinary discharge planning process, led by the attending physician.  Recommendations may be updated  based on patient status, additional functional criteria and insurance authorization.  Follow up Recommendations Follow physician's recommendations for discharge plan and follow up therapies      Assistance Recommended at Discharge    Functional Status Assessment    Frequency and  Duration min 2x/week  1 week       Prognosis Prognosis for improved oropharyngeal function: Guarded Barriers to Reach Goals: Cognitive deficits      Swallow Study   General Date of Onset: 01/16/23 HPI: Per admitting H&P " Samantha Carpenter is a 82 y.o. female with medical history significant of dementia complicated by frequent falls, COPD, chronic diastolic heart failure, CKD stage III A, hypertension, hyperlipidemia who presents to the ED due to a altered mental status.     History is limited by patient's underlying dementia.  She states that she came in today due to a fall but cannot recall the events surrounding the fall or how it happened.  She also endorses shortness of breath with cough and states has been happening for a while now.  When asking any other review of system questions, patient replies yes to each 1 including chest pain, nausea, vomiting, diarrhea, abdominal pain etc. Type of Study: Bedside Swallow Evaluation Diet Prior to this Study: Dysphagia 3 (mechanical soft);Mildly thick liquids (Level 2, nectar thick) Respiratory Status: Room air Behavior/Cognition: Confused;Lethargic/Drowsy;Distractible Oral Cavity Assessment: Within Functional Limits Oral Care Completed by SLP: No Oral Cavity - Dentition: Edentulous (Has upper partial in her room. placed in mouth but they fell out) Self-Feeding Abilities: Total assist Patient Positioning: Upright in bed Baseline Vocal Quality: Low vocal intensity    Oral/Motor/Sensory Function Overall Oral Motor/Sensory Function: Moderate impairment Facial Symmetry: Within Functional Limits   Ice Chips Ice chips: Within functional limits   Thin Liquid Thin Liquid: Impaired Presentation: Cup;Straw Pharyngeal  Phase Impairments: Cough - Delayed;Cough - Immediate    Nectar Thick Nectar Thick Liquid: Within functional limits Presentation: Cup   Honey Thick Honey Thick Liquid: Not tested   Puree Puree: Impaired Presentation: Spoon Oral Phase  Impairments: Poor awareness of bolus Oral Phase Functional Implications: Prolonged oral transit;Oral residue;Oral holding   Solid     Solid: Not tested      Eather Colas 01/17/2023,4:21 PM

## 2023-01-17 NOTE — Progress Notes (Signed)
Palliative Care Progress Note, Assessment & Plan   Patient Name: Samantha Carpenter       Date: 01/17/2023 DOB: 1941/01/20  Age: 82 y.o. MRN#: [id number] Attending Physician: Charise Killian, MD Primary Care Physician: Pcp, No Admit Date: 01/08/2023  Subjective: Patient is lying in bed, awake and alert.  She acknowledges my presence.  She is not able to make her wishes known.  When asked basic questions such as what would she like to eat for breakfast, patient responded with either a blank stare or the word "no".  She has no signs of distress.  Nurse tech is at bedside, who endorses patient had some difficulty with swallowing the chopped up food for breakfast.  HPI: 82 y.o. female  with past medical history of dementia with frequent falls, COPD, dCHF, CKD stage 3a, HTN, HLD admitted from [facility] on 01/08/2023 with AMS with associated SHOB and cough.   Admitted and being treated for COPD exacerbation with underlying pneumonia as well as CHF exacerbation.  Has required intermittent BiPAP support   Palliative medicine was consulted for assisting with goals of care conversations  Summary of counseling/coordination of care: Extensive chart review completed prior to meeting patient including labs, vital signs, imaging, progress notes, orders, and available advanced directive documents from current and previous encounters.   After reviewing the patient's chart and assessing the patient at bedside, I again can endorse that patient is unable to participate in goals of care medical decision making independently at this time.  I conveyed nurse tech's concern to attending that patient is having difficulty with chewing and swallowing current diet.  SLP to evaluate for potential diet changes.  I counseled with  dayshift RN who also endorses that patient has not been able to make her wishes known or communicate clearly.  Additionally, as per chart review, patient has been minimally able to participate in PT/OT.  After assessing the patient and counseling with medical team, I spoke with patient's legal guardian and sister Samantha Carpenter over the phone.  Samantha Carpenter has just gotten off of the phone with attending.  She shares she has got news that she does not like to hear.  She shares that patient is not progressing.  She shares that she believes the patient had a stroke.  We discussed signs and symptoms of a stroke.  Highlighted that patient CT scan was negative for intracranial abnormalities.  Detailed discussion had on the difference between dementia and stroke symptoms.  Again, I highlighted that dementia is a chronic, progressive, and irreversible disease that is often exacerbated by acute hospitalizations and illnesses.  Samantha Carpenter shares patient has had dementia for over 5 years but that it has never been like this.  Therapeutic silence, active listening, and emotional support provided.  Samantha Carpenter also shares she believes that the patient's COVID booster has had some effect on her mentation.  Outlined there is no scientific evidence to back up that the COVID booster would exacerbate someone's decreased functional, nutritional, and cognitive status.  Patient's underlying condition of dementia is significantly contributing to her overall prognosis.  Samantha Carpenter continues to say she does not believe this.  Samantha Carpenter maintains that she wants the patient to go to rehab.  She has other family numbers who have had strokes in the past and believes that the patient can recover just like these other patients.  I again politely attempted to reiterate that patient has not had a stroke but that her dementia is driving a significant portion of her current illness.  Questions and concerns were addressed.  PMT will continue to follow and support patient  throughout her hospitalization.   Physical Exam Vitals reviewed.  Constitutional:      General: She is not in acute distress. HENT:     Head: Normocephalic.     Mouth/Throat:     Mouth: Mucous membranes are moist.  Eyes:     Pupils: Pupils are equal, round, and reactive to light.  Pulmonary:     Effort: Pulmonary effort is normal.  Abdominal:     Palpations: Abdomen is soft.  Musculoskeletal:     Comments: Generalized weakness  Skin:    General: Skin is warm and dry.  Neurological:     Mental Status: She is alert.     Comments: Unable to answer orientation or basic questions  Psychiatric:        Behavior: Behavior normal.             Total Time 50 minutes   Time spent includes: Detailed review of medical records (labs, imaging, vital signs), medically appropriate exam (mental status, respiratory, cardiac, skin), discussed with treatment team, counseling and educating patient, family and staff, documenting clinical information, medication management and coordination of care.  Samantha Deist L. Bonita Quin, DNP, FNP-BC Palliative Medicine Team

## 2023-01-17 NOTE — Progress Notes (Signed)
PROGRESS NOTE    Samantha Carpenter  UEA:[id number] DOB: Aug 24, 1940 DOA: 01/08/2023 PCP: Pcp, No    Assessment & Plan:   Principal Problem:   Acute hypoxic respiratory failure (HCC) Active Problems:   COPD with acute exacerbation (HCC)   CAP (community acquired pneumonia)   CKD stage 3a, GFR 45-59 ml/min (HCC)   Dementia with behavioral disturbance (HCC)   Chronic diastolic CHF (congestive heart failure) (HCC)   HTN, goal below 140/80   Prediabetes   Falls frequently  Assessment and Plan: Acute hypoxic and hypercapnic respiratory failure: likely secondary to CAP, COPD & CHF. Continue on supplemental oxygen and wean as tolerated. Was on BiPAP but has since been weaned off   COPD exacerbation: completed abx & steroid course. Bronchodilators prn    CAP: completed abx course. Bronchodilators prn     Acute on chronic diastolic CHF: holding lasix.  Monitor I/Os    Dementia with behavioral disturbance: continue on supportive care. Haldol prn. Tapering home klonopin  Dysphagia: likely secondary to dementia. Speech re-consulted secondary to pt choking on food today     CKDIIIa: Cr is labile. Avoid nephrotoxic meds    HTN: continue on bisoprolol, losartan, aldactone. IV hydralazine prn     Prediabetes: HbA1c 5.8. Unable to comprehend preDM education    Falls frequently: likely secondary to dementia. PT/OT recs SNF    Hypokalemia: KCl repleated   Hypernatremia: slightly improved. Continue w/ free water flushes    Metabolic alkalosis: etiology unclear. Stable from day prior      DVT prophylaxis: lovenox  Code Status: DNR Family Communication: discussed pt's care w/ pt's sister, [name], and answered her questions Disposition Plan: needs SNF placement.  Level of care: Progressive Status is: Inpatient Remains inpatient appropriate because: pt started choking when eating today, speech re-consulted     Consultants:    Procedures:  Antimicrobials:   Subjective: Pt is  sleeping    Objective: Vitals:   01/16/23 1555 01/16/23 1900 01/17/23 0825 01/17/23 1138  BP: (!) 118/49 (!) 165/60 (!) 154/62 (!) 146/36  Pulse: 65 (!) 110  (!) 109  Resp: (!) 22 19    Temp: 97.7 F (36.5 C) 98.4 F (36.9 C) 97.9 F (36.6 C) 98.3 F (36.8 C)  TempSrc:  Oral    SpO2: (!) 84% (!) 89%  (!) 81%  Weight:      Height:        Intake/Output Summary (Last 24 hours) at 01/17/2023 1354 Last data filed at 01/17/2023 0700 Gross per 24 hour  Intake 429.54 ml  Output 300 ml  Net 129.54 ml   Filed Weights   01/08/23 1223  Weight: 68 kg    Examination:  General exam: appears comfortable Respiratory system: diminished breath sounds b/l  Cardiovascular system: S1/S2+. No rubs or clicks  Gastrointestinal system: abd is soft, NT, ND & hypoactive bowel soudns. Central nervous system: Pt is asleep Psychiatry: judgement and insight appears poor     Data Reviewed: I have personally reviewed following labs and imaging studies  CBC: Recent Labs  Lab 01/11/23 0435 01/12/23 0632 01/13/23 0439 01/14/23 0547 01/16/23 0501  WBC 5.5 11.2* 8.2 11.7* 9.6  HGB 11.4* 12.7 12.0 13.2 13.1  HCT 36.0 40.9 39.2 43.9 42.4  MCV 97.3 99.8 100.3* 101.9* 100.0  PLT 374 451* 405* 474* 419*   Basic Metabolic Panel: Recent Labs  Lab 01/11/23 0435 01/12/23 4782 01/13/23 0439 01/14/23 0547 01/15/23 0551 01/16/23 0501 01/17/23 0616  NA 147*   < >  150* 150* 148* 149* 147*  K 4.0   < > 3.9 3.7 3.5 3.3* 3.4*  CL 101   < > 102 97* 98 99 99  CO2 34*   < > 39* 41* 40* 40* 39*  GLUCOSE 197*   < > 146* 193* 150* 164* 158*  BUN 36*   < > 35* 38* 49* 46* 45*  CREATININE 1.10*   < > 1.04* 1.34* 1.29* 1.10* 1.30*  CALCIUM 9.1   < > 8.8* 9.1 8.7* 9.0 8.6*  MG 2.4  --  2.1 2.2  --  2.6*  --    < > = values in this interval not displayed.   GFR: Estimated Creatinine Clearance: 31.1 mL/min (A) (by C-G formula based on SCr of 1.3 mg/dL (H)). Liver Function Tests: No results for  input(s): "AST", "ALT", "ALKPHOS", "BILITOT", "PROT", "ALBUMIN" in the last 168 hours. No results for input(s): "LIPASE", "AMYLASE" in the last 168 hours. No results for input(s): "AMMONIA" in the last 168 hours. Coagulation Profile: No results for input(s): "INR", "PROTIME" in the last 168 hours. Cardiac Enzymes: No results for input(s): "CKTOTAL", "CKMB", "CKMBINDEX", "TROPONINI" in the last 168 hours. BNP (last 3 results) No results for input(s): "PROBNP" in the last 8760 hours. HbA1C: No results for input(s): "HGBA1C" in the last 72 hours. CBG: No results for input(s): "GLUCAP" in the last 168 hours. Lipid Profile: No results for input(s): "CHOL", "HDL", "LDLCALC", "TRIG", "CHOLHDL", "LDLDIRECT" in the last 72 hours. Thyroid Function Tests: No results for input(s): "TSH", "T4TOTAL", "FREET4", "T3FREE", "THYROIDAB" in the last 72 hours. Anemia Panel: No results for input(s): "VITAMINB12", "FOLATE", "FERRITIN", "TIBC", "IRON", "RETICCTPCT" in the last 72 hours. Sepsis Labs: Recent Labs  Lab 01/13/23 0436  PROCALCITON 0.21    Recent Results (from the past 240 hour(s))  Culture, blood (Routine x 2)     Status: None   Collection Time: 01/08/23 12:52 PM   Specimen: BLOOD  Result Value Ref Range Status   Specimen Description BLOOD RIGHT [facility]  Final   Special Requests   Final    BOTTLES DRAWN AEROBIC AND ANAEROBIC Blood Culture results may not be optimal due to an excessive volume of blood received in culture bottles   Culture   Final    NO GROWTH 5 DAYS Performed at [facility], [address] [place], [place] [address]    Report Status 01/13/2023 FINAL  Final  Resp panel by RT-PCR (RSV, Flu A&B, Covid) Anterior Nasal Swab     Status: None   Collection Time: 01/08/23 12:53 PM   Specimen: Anterior Nasal Swab  Result Value Ref Range Status   SARS Coronavirus 2 by RT PCR NEGATIVE NEGATIVE Final    Comment: (NOTE) SARS-CoV-2 target nucleic acids are NOT  DETECTED.  The SARS-CoV-2 RNA is generally detectable in upper respiratory specimens during the acute phase of infection. The lowest concentration of SARS-CoV-2 viral copies this assay can detect is 138 copies/mL. A negative result does not preclude SARS-Cov-2 infection and should not be used as the sole basis for treatment or other patient management decisions. A negative result may occur with  improper specimen collection/handling, submission of specimen other than nasopharyngeal swab, presence of viral mutation(s) within the areas targeted by this assay, and inadequate number of viral copies(<138 copies/mL). A negative result must be combined with clinical observations, patient history, and epidemiological information. The expected result is Negative.  Fact Sheet for Patients:  BloggerCourse.com  Fact Sheet for Healthcare Providers:  SeriousBroker.it  This test is no t yet approved or cleared by the [place] and  has been authorized for detection and/or diagnosis of SARS-CoV-2 by FDA under an Emergency Use Authorization (EUA). This EUA will remain  in effect (meaning this test can be used) for the duration of the COVID-19 declaration under Section 564(b)(1) of the Act, 21 U.S.C.section 360bbb-3(b)(1), unless the authorization is terminated  or revoked sooner.       Influenza A by PCR NEGATIVE NEGATIVE Final   Influenza B by PCR NEGATIVE NEGATIVE Final    Comment: (NOTE) The Xpert Xpress SARS-CoV-2/FLU/RSV plus assay is intended as an aid in the diagnosis of influenza from Nasopharyngeal swab specimens and should not be used as a sole basis for treatment. Nasal washings and aspirates are unacceptable for Xpert Xpress SARS-CoV-2/FLU/RSV testing.  Fact Sheet for Patients: BloggerCourse.com  Fact Sheet for Healthcare Providers: SeriousBroker.it  This test is not yet  approved or cleared by the [place] FDA and has been authorized for detection and/or diagnosis of SARS-CoV-2 by FDA under an Emergency Use Authorization (EUA). This EUA will remain in effect (meaning this test can be used) for the duration of the COVID-19 declaration under Section 564(b)(1) of the Act, 21 U.S.C. section 360bbb-3(b)(1), unless the authorization is terminated or revoked.     Resp Syncytial Virus by PCR NEGATIVE NEGATIVE Final    Comment: (NOTE) Fact Sheet for Patients: BloggerCourse.com  Fact Sheet for Healthcare Providers: SeriousBroker.it  This test is not yet approved or cleared by the [place] FDA and has been authorized for detection and/or diagnosis of SARS-CoV-2 by FDA under an Emergency Use Authorization (EUA). This EUA will remain in effect (meaning this test can be used) for the duration of the COVID-19 declaration under Section 564(b)(1) of the Act, 21 U.S.C. section 360bbb-3(b)(1), unless the authorization is terminated or revoked.  Performed at [facility], [address] [place], [place] [address]   Culture, blood (Routine x 2)     Status: None   Collection Time: 01/08/23 12:55 PM   Specimen: BLOOD  Result Value Ref Range Status   Specimen Description BLOOD LEFT [facility]  Final   Special Requests   Final    BOTTLES DRAWN AEROBIC AND ANAEROBIC Blood Culture results may not be optimal due to an inadequate volume of blood received in culture bottles   Culture   Final    NO GROWTH 5 DAYS Performed at [facility], [address] [place], [place] [address]    Report Status 01/13/2023 FINAL  Final  Respiratory (~20 pathogens) panel by PCR     Status: None   Collection Time: 01/08/23 11:35 PM   Specimen: Nasopharyngeal Swab; Respiratory  Result Value Ref Range Status   Adenovirus NOT DETECTED NOT DETECTED Final   Coronavirus 229E NOT DETECTED NOT DETECTED Final    Comment:  (NOTE) The Coronavirus on the Respiratory Panel, DOES NOT test for the novel  Coronavirus (2019 nCoV)    Coronavirus HKU1 NOT DETECTED NOT DETECTED Final   Coronavirus NL63 NOT DETECTED NOT DETECTED Final   Coronavirus OC43 NOT DETECTED NOT DETECTED Final   Metapneumovirus NOT DETECTED NOT DETECTED Final   Rhinovirus / Enterovirus NOT DETECTED NOT DETECTED Final   Influenza A NOT DETECTED NOT DETECTED Final   Influenza B NOT DETECTED NOT DETECTED Final   Parainfluenza Virus 1 NOT DETECTED NOT DETECTED Final   Parainfluenza Virus 2 NOT DETECTED NOT DETECTED Final   Parainfluenza Virus 3 NOT DETECTED  NOT DETECTED Final   Parainfluenza Virus 4 NOT DETECTED NOT DETECTED Final   Respiratory Syncytial Virus NOT DETECTED NOT DETECTED Final   Bordetella pertussis NOT DETECTED NOT DETECTED Final   Bordetella Parapertussis NOT DETECTED NOT DETECTED Final   Chlamydophila pneumoniae NOT DETECTED NOT DETECTED Final   Mycoplasma pneumoniae NOT DETECTED NOT DETECTED Final    Comment: Performed at [facility], [address], [place], [place] [address]  MRSA Next Gen by PCR, Nasal     Status: Abnormal   Collection Time: 01/11/23  7:42 PM   Specimen: Nasal Mucosa; Nasal Swab  Result Value Ref Range Status   MRSA by PCR Next Gen DETECTED (A) NOT DETECTED Final    Comment: RESULT CALLED TO, READ BACK BY AND VERIFIED WITH: [facility] WHITE RN @ 2139 01/11/23 LFD (NOTE) The GeneXpert MRSA Assay (FDA approved for NASAL specimens only), is one component of a comprehensive MRSA colonization surveillance program. It is not intended to diagnose MRSA infection nor to guide or monitor treatment for MRSA infections. Test performance is not FDA approved in patients less than 39 years old. Performed at [facility], [address] [place], [place] [address]          Radiology Studies: No results found.      Scheduled Meds:  aspirin EC  81 mg Oral Daily   bisoprolol  5 mg Oral Daily    clonazepam  0.125 mg Oral TID   DULoxetine  60 mg Oral Daily   enoxaparin (LOVENOX) injection  40 mg Subcutaneous Q24H   famotidine  20 mg Oral Daily   free water  200 mL Oral Q6H   influenza vaccine adjuvanted  0.5 mL Intramuscular Tomorrow-1000   losartan  100 mg Oral Daily   OLANZapine  2.5 mg Oral BID   OXcarbazepine  150 mg Oral q AM   OXcarbazepine  300 mg Oral QHS   rivastigmine  1.5 mg Oral BID   spironolactone  12.5 mg Oral Daily   Continuous Infusions:   LOS: 9 days      Charise Killian, MD [facility] Pager [phone]  If 7PM-7AM, please contact night-coverage [email or web address]  01/17/2023, 1:54 PM

## 2023-01-18 DIAGNOSIS — F03918 Unspecified dementia, unspecified severity, with other behavioral disturbance: Secondary | ICD-10-CM | POA: Diagnosis not present

## 2023-01-18 DIAGNOSIS — I5032 Chronic diastolic (congestive) heart failure: Secondary | ICD-10-CM | POA: Diagnosis not present

## 2023-01-18 DIAGNOSIS — J189 Pneumonia, unspecified organism: Secondary | ICD-10-CM | POA: Diagnosis not present

## 2023-01-18 DIAGNOSIS — J9601 Acute respiratory failure with hypoxia: Secondary | ICD-10-CM | POA: Diagnosis not present

## 2023-01-18 LAB — CBC
HCT: 32.8 % — ABNORMAL LOW (ref 36.0–46.0)
Hemoglobin: 10.3 g/dL — ABNORMAL LOW (ref 12.0–15.0)
MCH: 31.7 pg (ref 26.0–34.0)
MCHC: 31.4 g/dL (ref 30.0–36.0)
MCV: 100.9 fL — ABNORMAL HIGH (ref 80.0–100.0)
Platelets: 245 10*3/uL (ref 150–400)
RBC: 3.25 MIL/uL — ABNORMAL LOW (ref 3.87–5.11)
RDW: 13.5 % (ref 11.5–15.5)
WBC: 8.4 10*3/uL (ref 4.0–10.5)
nRBC: 0 % (ref 0.0–0.2)

## 2023-01-18 LAB — BASIC METABOLIC PANEL
Anion gap: 9 (ref 5–15)
BUN: 43 mg/dL — ABNORMAL HIGH (ref 8–23)
CO2: 38 mmol/L — ABNORMAL HIGH (ref 22–32)
Calcium: 8.6 mg/dL — ABNORMAL LOW (ref 8.9–10.3)
Chloride: 98 mmol/L (ref 98–111)
Creatinine, Ser: 1.41 mg/dL — ABNORMAL HIGH (ref 0.44–1.00)
GFR, Estimated: 37 mL/min — ABNORMAL LOW (ref >60)
Glucose, Bld: 158 mg/dL — ABNORMAL HIGH (ref 70–99)
Potassium: 3.7 mmol/L (ref 3.5–5.1)
Sodium: 145 mmol/L (ref 135–145)

## 2023-01-18 LAB — MAGNESIUM: Magnesium: 2.3 mg/dL (ref 1.7–2.4)

## 2023-01-18 MED ORDER — ENOXAPARIN SODIUM 30 MG/0.3ML IJ SOSY
30.0000 mg | PREFILLED_SYRINGE | INTRAMUSCULAR | Status: DC
  Administered 2023-01-18 – 2023-01-19 (×2): 30 mg via SUBCUTANEOUS
  Filled 2023-01-18 (×2): qty 0.3

## 2023-01-18 NOTE — Progress Notes (Signed)
Physical Therapy Treatment Patient Details Name: Samantha Carpenter MRN: [id number] DOB: Aug 10, 1940 Today's Date: 01/18/2023   History of Present Illness Patient is a 82 year old female coming [facility] [place] with fall, altered mental status. Found to have acute hypoxic respiratory failure with COPD exacerbation. History of dementia, frequent falls, COPD, chronic diastolic heart failure, CKD stage III A, hypertension, hyperlipidemia.    PT Comments  Pt was long sitting in bed, with BUE mitts on + sitter at bedside, upon arrival. Pt is alert but disoriented and confused. She is agreeable and cooperative but has pauses in alertness. Becomes non verbal for a  minute prior to responding to author's questions. This happened several times throughout session. Pt was eager to get OOB. Removed mitts without observing pt pulling or fidgeting. Pt was able to exit R side of bed, stand a few times EOB prior to standing and taking steps to recliner. Bed linen were changed prior to pt requesting to return to bed. Pt has poor RLE progression with attempts to ambulate. Max vcs for safety and technique. Pt will benefit from continued skilled PT to maximize her independence while decreasing caregiver burden.     If plan is discharge home, recommend the following: A lot of help with walking and/or transfers;A lot of help with bathing/dressing/bathroom;Assist for transportation;Help with stairs or ramp for entrance;Assistance with cooking/housework;Direct supervision/assist for medications management;Direct supervision/assist for financial management;Supervision due to cognitive status     Equipment Recommendations  Other (comment) (Defer to next level of care)       Precautions / Restrictions Precautions Precautions: Fall Restrictions Weight Bearing Restrictions: No     Mobility  Bed Mobility Overal bed mobility: Needs Assistance Bed Mobility: Supine to Sit, Sit to Supine  Supine to sit: Mod assist, Used rails, HOB  elevated Sit to supine: Max assist, Used rails, HOB elevated   Transfers Overall transfer level: Needs assistance Equipment used: Rolling walker (2 wheels) Transfers: Sit to/from Stand, Bed to chair/wheelchair/BSC Sit to Stand: Mod assist  General transfer comment: pt stood 2 x EOB then 1 x from recliner. struggles to initiate movements but once assisted to start transfers, does participate and assist. She was able to take a few steps to recliner however struggles with RLE advancement. L UE/LE seems stronger than RUE/RLE.    Ambulation/Gait Ambulation/Gait assistance: Mod assist Gait Distance (Feet): 3 Feet Assistive device: Rolling walker (2 wheels) Gait Pattern/deviations: Step-to pattern, Shuffle, Decreased step length - right, Decreased stance time - left, Decreased stride length Gait velocity: decreased  General Gait Details: Pt did take steps form EOB to recliner with use of RW however requires mod assist to progress RLE + max vcs for sequencing and technique. pt is slightly SOB however vitals stable.   Balance Overall balance assessment: Needs assistance Sitting-balance support: Feet supported Sitting balance-Leahy Scale: Fair     Standing balance support: Bilateral upper extremity supported Standing balance-Leahy Scale: Poor Standing balance comment: pt remains extremely high fall risk     Cognition Arousal: Alert Behavior During Therapy: Flat affect Overall Cognitive Status: History of cognitive impairments - at baseline   General Comments: pt has varied presentation cognitively during session. He is alert however disoriented. Has episodes of being non verbal however quickly returns to communicating with encouragement.           General Comments General comments (skin integrity, edema, etc.): Pt has BUE Mitts on and sitter in room during session. removed mitts throughout session without safety concerns observed  Pertinent Vitals/Pain Pain Assessment Pain  Assessment: No/denies pain Faces Pain Scale: No hurt     PT Goals (current goals can now be found in the care plan section) Acute Rehab PT Goals Patient Stated Goal: none stated Progress towards PT goals: Progressing toward goals    Frequency    Min 1X/week           Co-evaluation     PT goals addressed during session: Mobility/safety with mobility;Balance;Proper use of DME;Strengthening/ROM        AM-PAC PT "6 Clicks" Mobility   Outcome Measure  Help needed turning from your back to your side while in a flat bed without using bedrails?: A Lot Help needed moving from lying on your back to sitting on the side of a flat bed without using bedrails?: A Lot Help needed moving to and from a bed to a chair (including a wheelchair)?: A Lot Help needed standing up from a chair using your arms (e.g., wheelchair or bedside chair)?: A Lot Help needed to walk in hospital room?: A Lot Help needed climbing 3-5 steps with a railing? : Total 6 Click Score: 11    End of Session Equipment Utilized During Treatment: Oxygen Activity Tolerance: Patient tolerated treatment well;Patient limited by fatigue Patient left: in bed;with call bell/phone within reach;with bed alarm set;with family/visitor present;with nursing/sitter in room Nurse Communication: Mobility status PT Visit Diagnosis: Muscle weakness (generalized) (M62.81);Unsteadiness on feet (R26.81)     Time: 1610-9604 PT Time Calculation (min) (ACUTE ONLY): 19 min  Charges:    $Therapeutic Activity: 8-22 mins PT General Charges $$ ACUTE PT VISIT: 1 Visit                    Jetta Lout PTA 01/18/23, 2:31 PM

## 2023-01-18 NOTE — Progress Notes (Signed)
                                                     Palliative Care Progress Note, Assessment & Plan   Patient Name: Samantha Carpenter       Date: 01/18/2023 DOB: [birth year]  Age: 82 y.o. MRN#: [id number] Attending Physician: Charise Killian, MD Primary Care Physician: Pcp, No Admit Date: 01/08/2023  Subjective: Patient is sitting up in bed with mitts in place.  She does not acknowledge my presence.  She is not able to make her wishes known.  She is mumbling but it is incoherent/difficult to understand.  Sitter is at bedside.  No family or friends present during my visit.  HPI: 82 y.o. female  with past medical history of dementia with frequent falls, COPD, dCHF, CKD stage 3a, HTN, HLD admitted from [facility] on 01/08/2023 with AMS with associated SHOB and cough.   Admitted and being treated for COPD exacerbation with underlying pneumonia as well as CHF exacerbation.  Has required intermittent BiPAP support   Palliative medicine was consulted for assisting with goals of care conversations  Summary of counseling/coordination of care: Extensive chart review completed prior to meeting patient including labs, vital signs, imaging, progress notes, orders, and available advanced directive documents from current and previous encounters.   After reviewing the patient's chart and assessing the patient at bedside, I spoke with sitter at bedside in regards to patient's behaviors.  She endorses patient became agitated and has just received medication to help calm her down.  Patient does not appear to be agitated at this time.  However, she is not able to engage in appropriate discussions with me.  She remains unable to make medical decisions or participate in complex decision making independently.  Plan remains for patient to discharge to SNF when she has been sitter  free for 24 hours.  Patient's sister [name] has PMT contact info and was advised to call with any acute palliative needs.  I am off service until Monday and plan to follow-up with the patient and family at that time.  If acute palliative needs arise over the weekend, please utilize providers listed in Amion.  Physical Exam Vitals reviewed.  Constitutional:      General: She is not in acute distress.    Appearance: She is normal weight.  HENT:     Head: Normocephalic.     Mouth/Throat:     Mouth: Mucous membranes are moist.  Eyes:     Pupils: Pupils are equal, round, and reactive to light.  Pulmonary:     Effort: Pulmonary effort is normal.  Abdominal:     Palpations: Abdomen is soft.  Musculoskeletal:     Comments: Generalized weakness, fidgety  Skin:    General: Skin is warm and dry.  Neurological:     Mental Status: She is alert.             Total Time 25 minutes   Time spent includes: Detailed review of medical records (labs, imaging, vital signs), medically appropriate exam (mental status, respiratory, cardiac, skin), discussed with treatment team, counseling and educating patient, family and staff, documenting clinical information, medication management and coordination of care.  Samara Deist L. Bonita Quin, DNP, FNP-BC Palliative Medicine Team

## 2023-01-18 NOTE — TOC Progression Note (Signed)
Transition of Care [facility]) - Progression Note    Patient Details  Name: Samantha Carpenter MRN: [id number] Date of Birth: 1940/04/06  [facility] CM/SW Contact  Darolyn Rua, [place] Phone Number: 01/18/2023, 2:33 PM  Clinical Narrative:     Spoke with Gena at [facility] reports patient will need to be 24 hours sitter free before they can admit. MD and RN aware.  Expected Discharge Plan: Skilled Nursing Facility Barriers to Discharge: Continued Medical Work up  Expected Discharge Plan and Services       Living arrangements for the past 2 months: Assisted Living Facility                                       Social Determinants of Health (SDOH) Interventions SDOH Screenings   Food Insecurity: Patient Unable To Answer (01/11/2023)  Housing: Patient Unable To Answer (01/11/2023)  Transportation Needs: No Transportation Needs (11/07/2022)  Utilities: Not At Risk (11/07/2022)  Financial Resource Strain: Low Risk  (09/02/2018)  Physical Activity: Sufficiently Active (09/02/2018)  Social Connections: Unknown (09/02/2018)  Stress: No Stress Concern Present (09/02/2018)  Tobacco Use: Medium Risk (01/08/2023)    Readmission Risk Interventions    01/11/2022    2:05 PM 01/09/2022    2:23 PM  Readmission Risk Prevention Plan  Transportation Screening  Complete  HRI or Home Care Consult Complete   Palliative Care Screening  Not Applicable  Medication Review (RN Care Manager)  Complete

## 2023-01-18 NOTE — Progress Notes (Signed)
PROGRESS NOTE    Samantha Carpenter  ZOX:[id number] DOB: 06-14-1940 DOA: 01/08/2023 PCP: Pcp, No    Assessment & Plan:   Principal Problem:   Acute hypoxic respiratory failure (HCC) Active Problems:   COPD with acute exacerbation (HCC)   CAP (community acquired pneumonia)   CKD stage 3a, GFR 45-59 ml/min (HCC)   Dementia with behavioral disturbance (HCC)   Chronic diastolic CHF (congestive heart failure) (HCC)   HTN, goal below 140/80   Prediabetes   Falls frequently  Assessment and Plan: Acute hypoxic and hypercapnic respiratory failure: likely secondary to CAP, COPD & CHF. Continue on supplemental oxygen and wean as tolerated. Was on BiPAP but has since been weaned off   COPD exacerbation: completed abx & steroid course. Bronchodilators prn    CAP: completed abx course. Bronchodilators prn     Acute on chronic diastolic CHF: holding lasix. Monitor I/Os    Dementia with behavioral disturbance: continue on supportive care. Haldol prn. Tapering home klonopin  Dysphagia: likely secondary to dementia. Diet changed to dysphagia I diet as per speech    CKDIIIa: Cr is labile. Avoid nephrotoxic meds    HTN: continue on aldactone, bisoprolol, aldactone. IV hydralazine prn   Prediabetes: HbA1c 5.8. Unable to comprehend preDM education     Falls frequently: likely secondary to dementia. PT/OT recs SNF    Hypokalemia: WNL today    Hypernatremia: resolved    Metabolic alkalosis: etiology unclear. Stable from day prior      DVT prophylaxis: lovenox  Code Status: DNR Family Communication:  Disposition Plan: needs SNF placement.  Level of care: Progressive Status is: Inpatient Remains inpatient appropriate because: medically stable. Mental status is very poor    Consultants:    Procedures:  Antimicrobials:   Subjective: Pt is very confused     Objective: Vitals:   01/17/23 1138 01/17/23 1642 01/17/23 2017 01/18/23 0111  BP: (!) 146/36 (!) 146/49 (!) 119/46 (!)  158/61  Pulse: (!) 109 94 79 74  Resp:   16 16  Temp: 98.3 F (36.8 C) 98 F (36.7 C) 97.6 F (36.4 C) (!) 97.5 F (36.4 C)  TempSrc:    Oral  SpO2: (!) 81% (!) 86% 91% 90%  Weight:      Height:        Intake/Output Summary (Last 24 hours) at 01/18/2023 1357 Last data filed at 01/18/2023 0500 Gross per 24 hour  Intake --  Output 300 ml  Net -300 ml   Filed Weights   01/08/23 1223  Weight: 68 kg    Examination:  General exam: appears confused  Respiratory system: decreased breath sounds b/l Cardiovascular system: S1 & S2+. No rubs or clicks  Gastrointestinal system: abd is soft, NT, ND & hypoactive bowel sounds  Central nervous system: awake and confused  Psychiatry: judgement and insight appears poor     Data Reviewed: I have personally reviewed following labs and imaging studies  CBC: Recent Labs  Lab 01/12/23 0632 01/13/23 0439 01/14/23 0547 01/16/23 0501 01/18/23 0712  WBC 11.2* 8.2 11.7* 9.6 8.4  HGB 12.7 12.0 13.2 13.1 10.3*  HCT 40.9 39.2 43.9 42.4 32.8*  MCV 99.8 100.3* 101.9* 100.0 100.9*  PLT 451* 405* 474* 419* 245   Basic Metabolic Panel: Recent Labs  Lab 01/13/23 0439 01/14/23 0547 01/15/23 0551 01/16/23 0501 01/17/23 0616 01/18/23 0712  NA 150* 150* 148* 149* 147* 145  K 3.9 3.7 3.5 3.3* 3.4* 3.7  CL 102 97* 98 99 99 98  CO2  39* 41* 40* 40* 39* 38*  GLUCOSE 146* 193* 150* 164* 158* 158*  BUN 35* 38* 49* 46* 45* 43*  CREATININE 1.04* 1.34* 1.29* 1.10* 1.30* 1.41*  CALCIUM 8.8* 9.1 8.7* 9.0 8.6* 8.6*  MG 2.1 2.2  --  2.6*  --  2.3   GFR: Estimated Creatinine Clearance: 28.6 mL/min (A) (by C-G formula based on SCr of 1.41 mg/dL (H)). Liver Function Tests: No results for input(s): "AST", "ALT", "ALKPHOS", "BILITOT", "PROT", "ALBUMIN" in the last 168 hours. No results for input(s): "LIPASE", "AMYLASE" in the last 168 hours. No results for input(s): "AMMONIA" in the last 168 hours. Coagulation Profile: No results for input(s): "INR",  "PROTIME" in the last 168 hours. Cardiac Enzymes: No results for input(s): "CKTOTAL", "CKMB", "CKMBINDEX", "TROPONINI" in the last 168 hours. BNP (last 3 results) No results for input(s): "PROBNP" in the last 8760 hours. HbA1C: No results for input(s): "HGBA1C" in the last 72 hours. CBG: No results for input(s): "GLUCAP" in the last 168 hours. Lipid Profile: No results for input(s): "CHOL", "HDL", "LDLCALC", "TRIG", "CHOLHDL", "LDLDIRECT" in the last 72 hours. Thyroid Function Tests: No results for input(s): "TSH", "T4TOTAL", "FREET4", "T3FREE", "THYROIDAB" in the last 72 hours. Anemia Panel: No results for input(s): "VITAMINB12", "FOLATE", "FERRITIN", "TIBC", "IRON", "RETICCTPCT" in the last 72 hours. Sepsis Labs: Recent Labs  Lab 01/13/23 0436  PROCALCITON 0.21    Recent Results (from the past 240 hour(s))  Respiratory (~20 pathogens) panel by PCR     Status: None   Collection Time: 01/08/23 11:35 PM   Specimen: Nasopharyngeal Swab; Respiratory  Result Value Ref Range Status   Adenovirus NOT DETECTED NOT DETECTED Final   Coronavirus 229E NOT DETECTED NOT DETECTED Final    Comment: (NOTE) The Coronavirus on the Respiratory Panel, DOES NOT test for the novel  Coronavirus (2019 nCoV)    Coronavirus HKU1 NOT DETECTED NOT DETECTED Final   Coronavirus NL63 NOT DETECTED NOT DETECTED Final   Coronavirus OC43 NOT DETECTED NOT DETECTED Final   Metapneumovirus NOT DETECTED NOT DETECTED Final   Rhinovirus / Enterovirus NOT DETECTED NOT DETECTED Final   Influenza A NOT DETECTED NOT DETECTED Final   Influenza B NOT DETECTED NOT DETECTED Final   Parainfluenza Virus 1 NOT DETECTED NOT DETECTED Final   Parainfluenza Virus 2 NOT DETECTED NOT DETECTED Final   Parainfluenza Virus 3 NOT DETECTED NOT DETECTED Final   Parainfluenza Virus 4 NOT DETECTED NOT DETECTED Final   Respiratory Syncytial Virus NOT DETECTED NOT DETECTED Final   Bordetella pertussis NOT DETECTED NOT DETECTED Final    Bordetella Parapertussis NOT DETECTED NOT DETECTED Final   Chlamydophila pneumoniae NOT DETECTED NOT DETECTED Final   Mycoplasma pneumoniae NOT DETECTED NOT DETECTED Final    Comment: Performed at [facility], [address] [place], [place] [address]  MRSA Next Gen by PCR, Nasal     Status: Abnormal   Collection Time: 01/11/23  7:42 PM   Specimen: Nasal Mucosa; Nasal Swab  Result Value Ref Range Status   MRSA by PCR Next Gen DETECTED (A) NOT DETECTED Final    Comment: RESULT CALLED TO, READ BACK BY AND VERIFIED WITH: [facility] WHITE RN @ 2139 01/11/23 LFD (NOTE) The GeneXpert MRSA Assay (FDA approved for NASAL specimens only), is one component of a comprehensive MRSA colonization surveillance program. It is not intended to diagnose MRSA infection nor to guide or monitor treatment for MRSA infections. Test performance is not FDA approved in patients less than 52 years old.  Performed at [facility], [address], [place], [place] [address]          Radiology Studies: No results found.      Scheduled Meds:  aspirin EC  81 mg Oral Daily   bisoprolol  5 mg Oral Daily   clonazepam  0.125 mg Oral TID   DULoxetine  60 mg Oral Daily   enoxaparin (LOVENOX) injection  40 mg Subcutaneous Q24H   famotidine  20 mg Oral Daily   free water  200 mL Oral Q6H   influenza vaccine adjuvanted  0.5 mL Intramuscular Tomorrow-1000   losartan  100 mg Oral Daily   OLANZapine  2.5 mg Oral BID   OXcarbazepine  150 mg Oral q AM   OXcarbazepine  300 mg Oral QHS   rivastigmine  1.5 mg Oral BID   spironolactone  12.5 mg Oral Daily   Continuous Infusions:   LOS: 10 days      Charise Killian, MD [facility] Pager [phone]  If 7PM-7AM, please contact night-coverage [email or web address]  01/18/2023, 1:57 PM

## 2023-01-18 NOTE — Plan of Care (Signed)
  Problem: Education: Goal: Knowledge of disease or condition will improve Outcome: Progressing Goal: Knowledge of the prescribed therapeutic regimen will improve Outcome: Progressing Goal: Individualized Educational Video(s) Outcome: Progressing   Problem: Activity: Goal: Ability to tolerate increased activity will improve Outcome: Progressing Goal: Will verbalize the importance of balancing activity with adequate rest periods Outcome: Progressing   Problem: Respiratory: Goal: Ability to maintain a clear airway will improve Outcome: Progressing Goal: Levels of oxygenation will improve Outcome: Progressing Goal: Ability to maintain adequate ventilation will improve Outcome: Progressing   Problem: Activity: Goal: Ability to tolerate increased activity will improve Outcome: Progressing   Problem: Clinical Measurements: Goal: Ability to maintain a body temperature in the normal range will improve Outcome: Progressing   Problem: Respiratory: Goal: Ability to maintain adequate ventilation will improve Outcome: Progressing Goal: Ability to maintain a clear airway will improve Outcome: Progressing   Problem: Education: Goal: Knowledge of General Education information will improve Description: Including pain rating scale, medication(s)/side effects and non-pharmacologic comfort measures Outcome: Progressing   Problem: Health Behavior/Discharge Planning: Goal: Ability to manage health-related needs will improve Outcome: Progressing   Problem: Clinical Measurements: Goal: Ability to maintain clinical measurements within normal limits will improve Outcome: Progressing Goal: Will remain free from infection Outcome: Progressing Goal: Diagnostic test results will improve Outcome: Progressing Goal: Respiratory complications will improve Outcome: Progressing Goal: Cardiovascular complication will be avoided Outcome: Progressing   Problem: Activity: Goal: Risk for activity  intolerance will decrease Outcome: Progressing   Problem: Nutrition: Goal: Adequate nutrition will be maintained Outcome: Progressing   Problem: Coping: Goal: Level of anxiety will decrease Outcome: Progressing   Problem: Elimination: Goal: Will not experience complications related to bowel motility Outcome: Progressing Goal: Will not experience complications related to urinary retention Outcome: Progressing   Problem: Pain Management: Goal: General experience of comfort will improve Outcome: Progressing   Problem: Safety: Goal: Ability to remain free from injury will improve Outcome: Progressing   Problem: Skin Integrity: Goal: Risk for impaired skin integrity will decrease Outcome: Progressing   Problem: Safety: Goal: Non-violent Restraint(s) Outcome: Progressing

## 2023-01-19 DIAGNOSIS — F03918 Unspecified dementia, unspecified severity, with other behavioral disturbance: Secondary | ICD-10-CM | POA: Diagnosis not present

## 2023-01-19 LAB — BASIC METABOLIC PANEL
Anion gap: 9 (ref 5–15)
BUN: 44 mg/dL — ABNORMAL HIGH (ref 8–23)
CO2: 38 mmol/L — ABNORMAL HIGH (ref 22–32)
Calcium: 8.7 mg/dL — ABNORMAL LOW (ref 8.9–10.3)
Chloride: 99 mmol/L (ref 98–111)
Creatinine, Ser: 1.31 mg/dL — ABNORMAL HIGH (ref 0.44–1.00)
GFR, Estimated: 40 mL/min — ABNORMAL LOW (ref >60)
Glucose, Bld: 136 mg/dL — ABNORMAL HIGH (ref 70–99)
Potassium: 4.2 mmol/L (ref 3.5–5.1)
Sodium: 146 mmol/L — ABNORMAL HIGH (ref 135–145)

## 2023-01-19 NOTE — Progress Notes (Signed)
PROGRESS NOTE    Samantha Carpenter  IRS:[id number] DOB: [birth year] DOA: 01/08/2023 PCP: Pcp, No    Assessment & Plan:   Principal Problem:   Acute hypoxic respiratory failure (HCC) Active Problems:   COPD with acute exacerbation (HCC)   CAP (community acquired pneumonia)   CKD stage 3a, GFR 45-59 ml/min (HCC)   Dementia with behavioral disturbance (HCC)   Chronic diastolic CHF (congestive heart failure) (HCC)   HTN, goal below 140/80   Prediabetes   Falls frequently  Assessment and Plan: Acute hypoxic and hypercapnic respiratory failure: likely secondary to CAP, COPD & CHF. Continue on supplemental oxygen and wean as tolerated. Was on BiPAP but has since been weaned off   COPD exacerbation: completed abx & steroid course. Bronchodilators prn    CAP: completed abx course. Bronchodilators prn    Acute on chronic diastolic CHF: holding lasix. Monitor I/Os   Dementia with behavioral disturbance: continue w/ supportive care. Haldol prn. Tapering home klonopin  Dysphagia: likely secondary to dementia. Diet changed to dysphagia I diet as per speech    CKDIIIa:Cr is labile. Avoid nephrotoxic meds    HTN: continue on aldactone, bisoprolol, aldactone. IV hydralazine prn   Prediabetes: HbA1c 5.8. Unable to comprehend preDM education      Falls frequently: likely secondary to dementia. PT/OT recs SNF   Hypokalemia: WNL today    Hypernatremia: labile. Continue w/ free water flushes    Metabolic alkalosis: etiology unclear. Stable from day prior      DVT prophylaxis: lovenox  Code Status: DNR Family Communication:  Disposition Plan: needs SNF placement.  Level of care: Progressive Status is: Inpatient Remains inpatient appropriate because: medically stable. Mental status is very poor. Waiting on SNF placement     Consultants:    Procedures:  Antimicrobials:   Subjective: Pt is resting comfortably   Objective: Vitals:   01/18/23 0111 01/18/23 1922 01/19/23  0538 01/19/23 0720  BP: (!) 158/61 (!) 132/51 (!) 128/59 (!) 183/65  Pulse: 74 (!) 56 65 75  Resp: 16 18 17  (!) 25  Temp: (!) 97.5 F (36.4 C) 97.8 F (36.6 C) 97.8 F (36.6 C) (!) 97.5 F (36.4 C)  TempSrc: Oral     SpO2: 90% 100% 100% 99%  Weight:      Height:        Intake/Output Summary (Last 24 hours) at 01/19/2023 1052 Last data filed at 01/19/2023 0724 Gross per 24 hour  Intake 440 ml  Output 625 ml  Net -185 ml   Filed Weights   01/08/23 1223  Weight: 68 kg    Examination:  General exam:  appears comfortable  Respiratory system: diminished breath sounds b/l  Cardiovascular system: S1/S2+. No rubs or clicks   Gastrointestinal system: abd is soft, NT, ND & hypoactive bowel sounds  Central nervous system: sleeping  Psychiatry: judgement and insight appears poor      Data Reviewed: I have personally reviewed following labs and imaging studies  CBC: Recent Labs  Lab 01/13/23 0439 01/14/23 0547 01/16/23 0501 01/18/23 0712  WBC 8.2 11.7* 9.6 8.4  HGB 12.0 13.2 13.1 10.3*  HCT 39.2 43.9 42.4 32.8*  MCV 100.3* 101.9* 100.0 100.9*  PLT 405* 474* 419* 245   Basic Metabolic Panel: Recent Labs  Lab 01/13/23 0439 01/14/23 0547 01/15/23 0551 01/16/23 0501 01/17/23 0616 01/18/23 0712 01/19/23 0605  NA 150* 150* 148* 149* 147* 145 146*  K 3.9 3.7 3.5 3.3* 3.4* 3.7 4.2  CL 102 97* 98 99 99  98 99  CO2 39* 41* 40* 40* 39* 38* 38*  GLUCOSE 146* 193* 150* 164* 158* 158* 136*  BUN 35* 38* 49* 46* 45* 43* 44*  CREATININE 1.04* 1.34* 1.29* 1.10* 1.30* 1.41* 1.31*  CALCIUM 8.8* 9.1 8.7* 9.0 8.6* 8.6* 8.7*  MG 2.1 2.2  --  2.6*  --  2.3  --    GFR: Estimated Creatinine Clearance: 30.8 mL/min (A) (by C-G formula based on SCr of 1.31 mg/dL (H)). Liver Function Tests: No results for input(s): "AST", "ALT", "ALKPHOS", "BILITOT", "PROT", "ALBUMIN" in the last 168 hours. No results for input(s): "LIPASE", "AMYLASE" in the last 168 hours. No results for input(s):  "AMMONIA" in the last 168 hours. Coagulation Profile: No results for input(s): "INR", "PROTIME" in the last 168 hours. Cardiac Enzymes: No results for input(s): "CKTOTAL", "CKMB", "CKMBINDEX", "TROPONINI" in the last 168 hours. BNP (last 3 results) No results for input(s): "PROBNP" in the last 8760 hours. HbA1C: No results for input(s): "HGBA1C" in the last 72 hours. CBG: No results for input(s): "GLUCAP" in the last 168 hours. Lipid Profile: No results for input(s): "CHOL", "HDL", "LDLCALC", "TRIG", "CHOLHDL", "LDLDIRECT" in the last 72 hours. Thyroid Function Tests: No results for input(s): "TSH", "T4TOTAL", "FREET4", "T3FREE", "THYROIDAB" in the last 72 hours. Anemia Panel: No results for input(s): "VITAMINB12", "FOLATE", "FERRITIN", "TIBC", "IRON", "RETICCTPCT" in the last 72 hours. Sepsis Labs: Recent Labs  Lab 01/13/23 0436  PROCALCITON 0.21    Recent Results (from the past 240 hour(s))  MRSA Next Gen by PCR, Nasal     Status: Abnormal   Collection Time: 01/11/23  7:42 PM   Specimen: Nasal Mucosa; Nasal Swab  Result Value Ref Range Status   MRSA by PCR Next Gen DETECTED (A) NOT DETECTED Final    Comment: RESULT CALLED TO, READ BACK BY AND VERIFIED WITH: [facility] WHITE RN @ 2139 01/11/23 LFD (NOTE) The GeneXpert MRSA Assay (FDA approved for NASAL specimens only), is one component of a comprehensive MRSA colonization surveillance program. It is not intended to diagnose MRSA infection nor to guide or monitor treatment for MRSA infections. Test performance is not FDA approved in patients less than 40 years old. Performed at [facility], [address], [place], [place] [address]          Radiology Studies: No results found.      Scheduled Meds:  aspirin EC  81 mg Oral Daily   bisoprolol  5 mg Oral Daily   clonazepam  0.125 mg Oral TID   DULoxetine  60 mg Oral Daily   enoxaparin (LOVENOX) injection  30 mg Subcutaneous Q24H   famotidine  20 mg Oral  Daily   free water  200 mL Oral Q6H   influenza vaccine adjuvanted  0.5 mL Intramuscular Tomorrow-1000   losartan  100 mg Oral Daily   OLANZapine  2.5 mg Oral BID   OXcarbazepine  150 mg Oral q AM   OXcarbazepine  300 mg Oral QHS   rivastigmine  1.5 mg Oral BID   spironolactone  12.5 mg Oral Daily   Continuous Infusions:   LOS: 11 days      Charise Killian, MD [facility] Pager [phone]  If 7PM-7AM, please contact night-coverage [email or web address]  01/19/2023, 10:52 AM

## 2023-01-19 NOTE — Plan of Care (Signed)
  Problem: Education: Goal: Knowledge of disease or condition will improve Outcome: Progressing Goal: Knowledge of the prescribed therapeutic regimen will improve Outcome: Progressing Goal: Individualized Educational Video(s) Outcome: Progressing   Problem: Activity: Goal: Ability to tolerate increased activity will improve Outcome: Progressing Goal: Will verbalize the importance of balancing activity with adequate rest periods Outcome: Progressing   Problem: Respiratory: Goal: Ability to maintain a clear airway will improve Outcome: Progressing Goal: Levels of oxygenation will improve Outcome: Progressing Goal: Ability to maintain adequate ventilation will improve Outcome: Progressing   Problem: Activity: Goal: Ability to tolerate increased activity will improve Outcome: Progressing   Problem: Clinical Measurements: Goal: Ability to maintain a body temperature in the normal range will improve Outcome: Progressing   Problem: Respiratory: Goal: Ability to maintain adequate ventilation will improve Outcome: Progressing Goal: Ability to maintain a clear airway will improve Outcome: Progressing   Problem: Education: Goal: Knowledge of General Education information will improve Description: Including pain rating scale, medication(s)/side effects and non-pharmacologic comfort measures Outcome: Progressing   Problem: Health Behavior/Discharge Planning: Goal: Ability to manage health-related needs will improve Outcome: Progressing   Problem: Clinical Measurements: Goal: Ability to maintain clinical measurements within normal limits will improve Outcome: Progressing Goal: Will remain free from infection Outcome: Progressing Goal: Diagnostic test results will improve Outcome: Progressing Goal: Respiratory complications will improve Outcome: Progressing Goal: Cardiovascular complication will be avoided Outcome: Progressing   Problem: Activity: Goal: Risk for activity  intolerance will decrease Outcome: Progressing   Problem: Nutrition: Goal: Adequate nutrition will be maintained Outcome: Progressing   Problem: Coping: Goal: Level of anxiety will decrease Outcome: Progressing   Problem: Elimination: Goal: Will not experience complications related to bowel motility Outcome: Progressing Goal: Will not experience complications related to urinary retention Outcome: Progressing   Problem: Pain Management: Goal: General experience of comfort will improve Outcome: Progressing   Problem: Safety: Goal: Ability to remain free from injury will improve Outcome: Progressing   Problem: Skin Integrity: Goal: Risk for impaired skin integrity will decrease Outcome: Progressing   Problem: Safety: Goal: Non-violent Restraint(s) Outcome: Progressing

## 2023-01-19 NOTE — Progress Notes (Signed)
Speech Language Pathology Treatment: Dysphagia  Patient Details Name: Samantha Carpenter MRN: [id number] DOB: 1940/05/20 Today's Date: 01/19/2023 Time: 4098-1191 SLP Time Calculation (min) (ACUTE ONLY): 10 min  Assessment / Plan / Recommendation Clinical Impression  Pt seen for ongoing diet management. In reviewing pt's chart, she was recently upgraded to thin liquids. Skilled observation provided and nursing interviewed regarding pt's consumption of thin liquids. Pt was free of any overt s/s of aspiration during session and her nurse confirms that she is consuming without overt s/s/of aspiration. Pt was observed with mildly slow oral prep when consuming small bolus of puree. As such, will continue with dysphagia 1 diet and defer  further diet advancement to next venue of care. Skilled ST services are not indicated at this time.     HPI HPI: Per admitting H&P " Samantha Carpenter is a 82 y.o. female with medical history significant of dementia complicated by frequent falls, COPD, chronic diastolic heart failure, CKD stage III A, hypertension, hyperlipidemia who presents to the ED due to a altered mental status.     History is limited by patient's underlying dementia.  She states that she came in today due to a fall but cannot recall the events surrounding the fall or how it happened.  She also endorses shortness of breath with cough and states has been happening for a while now.  When asking any other review of system questions, patient replies yes to each 1 including chest pain, nausea, vomiting, diarrhea, abdominal pain etc.      SLP Plan  All goals met      Recommendations for follow up therapy are one component of a multi-disciplinary discharge planning process, led by the attending physician.  Recommendations may be updated based on patient status, additional functional criteria and insurance authorization.    Recommendations  Diet recommendations: Dysphagia 1 (puree);Thin liquid Liquids provided  via: Straw Medication Administration: Crushed with puree Supervision: Staff to assist with self feeding;Full supervision/cueing for compensatory strategies Compensations: Minimize environmental distractions;Slow rate;Small sips/bites;Follow solids with liquid;Other (Comment) Postural Changes and/or Swallow Maneuvers: Seated upright 90 degrees                  Oral care BID   Set up Supervision/Assistance Dysphagia, oropharyngeal phase (R13.12)     All goals met     Truly Stankiewicz  01/19/2023, 1:33 PM

## 2023-01-20 DIAGNOSIS — F03918 Unspecified dementia, unspecified severity, with other behavioral disturbance: Secondary | ICD-10-CM | POA: Diagnosis not present

## 2023-01-20 LAB — BASIC METABOLIC PANEL
Anion gap: 9 (ref 5–15)
BUN: 41 mg/dL — ABNORMAL HIGH (ref 8–23)
CO2: 38 mmol/L — ABNORMAL HIGH (ref 22–32)
Calcium: 9 mg/dL (ref 8.9–10.3)
Chloride: 99 mmol/L (ref 98–111)
Creatinine, Ser: 1.11 mg/dL — ABNORMAL HIGH (ref 0.44–1.00)
GFR, Estimated: 49 mL/min — ABNORMAL LOW (ref >60)
Glucose, Bld: 132 mg/dL — ABNORMAL HIGH (ref 70–99)
Potassium: 4.3 mmol/L (ref 3.5–5.1)
Sodium: 146 mmol/L — ABNORMAL HIGH (ref 135–145)

## 2023-01-20 MED ORDER — ORAL CARE MOUTH RINSE
15.0000 mL | OROMUCOSAL | Status: DC
  Administered 2023-01-20 – 2023-01-24 (×17): 15 mL via OROMUCOSAL

## 2023-01-20 MED ORDER — ENOXAPARIN SODIUM 40 MG/0.4ML IJ SOSY
40.0000 mg | PREFILLED_SYRINGE | INTRAMUSCULAR | Status: DC
Start: 2023-01-20 — End: 2023-01-23
  Administered 2023-01-20 – 2023-01-22 (×3): 40 mg via SUBCUTANEOUS
  Filled 2023-01-20 (×3): qty 0.4

## 2023-01-20 MED ORDER — ORAL CARE MOUTH RINSE: 15.0000 mL | OROMUCOSAL | Status: DC | PRN

## 2023-01-20 NOTE — TOC Progression Note (Signed)
Transition of Care [facility]) - Progression Note    Patient Details  Name: Samantha Carpenter MRN: [id number] Date of Birth: 03-21-1940  [facility]) CM/SW Contact  Bing Quarry, RN Phone Number: 01/20/2023, 5:03 PM  Clinical Narrative:   11/3: As of 5 pm today has been without safety sitter (order dc/d) since 01/19/23 at 1015 am.  Gabriel Cirri MSN RN CM  Care Management Department.  [facility] Direct Dial: [phone] Main Office Phone: [phone] Weekends Only      Expected Discharge Plan: Skilled Nursing Facility Barriers to Discharge: Continued Medical Work up  Expected Discharge Plan and Services       Living arrangements for the past 2 months: Assisted Living Facility                                       Social Determinants of Health (SDOH) Interventions SDOH Screenings   Food Insecurity: Patient Unable To Answer (01/11/2023)  Housing: Patient Unable To Answer (01/11/2023)  Transportation Needs: No Transportation Needs (11/07/2022)  Utilities: Not At Risk (11/07/2022)  Financial Resource Strain: Low Risk  (09/02/2018)  Physical Activity: Sufficiently Active (09/02/2018)  Social Connections: Unknown (09/02/2018)  Stress: No Stress Concern Present (09/02/2018)  Tobacco Use: Medium Risk (01/08/2023)    Readmission Risk Interventions    01/11/2022    2:05 PM 01/09/2022    2:23 PM  Readmission Risk Prevention Plan  Transportation Screening  Complete  HRI or Home Care Consult Complete   Palliative Care Screening  Not Applicable  Medication Review (RN Care Manager)  Complete

## 2023-01-20 NOTE — Progress Notes (Signed)
PROGRESS NOTE    Samantha Carpenter  ZOX:[id number] DOB: January 04, 1941 DOA: 01/08/2023 PCP: Pcp, No    Assessment & Plan:   Principal Problem:   Acute hypoxic respiratory failure (HCC) Active Problems:   COPD with acute exacerbation (HCC)   CAP (community acquired pneumonia)   CKD stage 3a, GFR 45-59 ml/min (HCC)   Dementia with behavioral disturbance (HCC)   Chronic diastolic CHF (congestive heart failure) (HCC)   HTN, goal below 140/80   Prediabetes   Falls frequently  Assessment and Plan: Acute hypoxic and hypercapnic respiratory failure: likely secondary to CAP, COPD & CHF. Continue on supplemental oxygen and wean as tolerated. Was on BiPAP but has since been weaned off   COPD exacerbation: completed abx & steroid course. Bronchodilators prn    CAP: completed abx course. Bronchodilators prn    Acute on chronic diastolic CHF: holding lasix. Monitor I/Os    Dementia with behavioral disturbance: continue w/ supportive care. Haldol prn. Tapering home klonopin  Dysphagia: likely secondary to dementia. Diet changed to dysphagia I diet as per speech    CKDIIIa: Cr is labile. Avoid nephrotoxic meds    HTN: continue on aldactone, bisoprolol, aldactone. IV hydralazine prn   Prediabetes: HbA1c 5.8. Unable to comprehend preDM education   Falls frequently: likely secondary to dementia. PT/OT recs SNF    Hypokalemia: WNL today    Hypernatremia: stable from day prior. Continue w/ free water flushes    Metabolic alkalosis: etiology unclear. Stable from day prior      DVT prophylaxis: lovenox  Code Status: DNR Family Communication:  Disposition Plan: needs SNF placement.  Level of care: Progressive Status is: Inpatient Remains inpatient appropriate because: medically stable. Mental status is very poor. Waiting on SNF placement     Consultants:    Procedures:  Antimicrobials:   Subjective: Pt is pleasantly confused   Objective: Vitals:   01/19/23 1535 01/19/23  2004 01/20/23 0000 01/20/23 0400  BP: (!) 175/47 (!) 140/38 (!) 137/44 (!) 122/54  Pulse: 62 (!) 54  (!) 57  Resp: 20 18 19 17   Temp: 97.6 F (36.4 C) 97.8 F (36.6 C) 97.8 F (36.6 C) (!) 97.5 F (36.4 C)  TempSrc: Oral Oral Oral Oral  SpO2: (!) 73% 99% 99% 98%  Weight:      Height:        Intake/Output Summary (Last 24 hours) at 01/20/2023 0812 Last data filed at 01/19/2023 2123 Gross per 24 hour  Intake 110 ml  Output 100 ml  Net 10 ml   Filed Weights   01/08/23 1223  Weight: 68 kg    Examination:  General exam: appears calm & comfortable  Respiratory system: decreased breath sounds b/l  Cardiovascular system: S1 & S2+. No rubs or clicks  Gastrointestinal system: abd is soft, NT, ND & hypoactive bowel sounds Central nervous system: alert & awake  Psychiatry: judgement and insight appears poor      Data Reviewed: I have personally reviewed following labs and imaging studies  CBC: Recent Labs  Lab 01/14/23 0547 01/16/23 0501 01/18/23 0712  WBC 11.7* 9.6 8.4  HGB 13.2 13.1 10.3*  HCT 43.9 42.4 32.8*  MCV 101.9* 100.0 100.9*  PLT 474* 419* 245   Basic Metabolic Panel: Recent Labs  Lab 01/14/23 0547 01/15/23 0551 01/16/23 0501 01/17/23 0616 01/18/23 0712 01/19/23 0605 01/20/23 0447  NA 150*   < > 149* 147* 145 146* 146*  K 3.7   < > 3.3* 3.4* 3.7 4.2 4.3  CL  97*   < > 99 99 98 99 99  CO2 41*   < > 40* 39* 38* 38* 38*  GLUCOSE 193*   < > 164* 158* 158* 136* 132*  BUN 38*   < > 46* 45* 43* 44* 41*  CREATININE 1.34*   < > 1.10* 1.30* 1.41* 1.31* 1.11*  CALCIUM 9.1   < > 9.0 8.6* 8.6* 8.7* 9.0  MG 2.2  --  2.6*  --  2.3  --   --    < > = values in this interval not displayed.   GFR: Estimated Creatinine Clearance: 36.4 mL/min (A) (by C-G formula based on SCr of 1.11 mg/dL (H)). Liver Function Tests: No results for input(s): "AST", "ALT", "ALKPHOS", "BILITOT", "PROT", "ALBUMIN" in the last 168 hours. No results for input(s): "LIPASE", "AMYLASE" in  the last 168 hours. No results for input(s): "AMMONIA" in the last 168 hours. Coagulation Profile: No results for input(s): "INR", "PROTIME" in the last 168 hours. Cardiac Enzymes: No results for input(s): "CKTOTAL", "CKMB", "CKMBINDEX", "TROPONINI" in the last 168 hours. BNP (last 3 results) No results for input(s): "PROBNP" in the last 8760 hours. HbA1C: No results for input(s): "HGBA1C" in the last 72 hours. CBG: No results for input(s): "GLUCAP" in the last 168 hours. Lipid Profile: No results for input(s): "CHOL", "HDL", "LDLCALC", "TRIG", "CHOLHDL", "LDLDIRECT" in the last 72 hours. Thyroid Function Tests: No results for input(s): "TSH", "T4TOTAL", "FREET4", "T3FREE", "THYROIDAB" in the last 72 hours. Anemia Panel: No results for input(s): "VITAMINB12", "FOLATE", "FERRITIN", "TIBC", "IRON", "RETICCTPCT" in the last 72 hours. Sepsis Labs: No results for input(s): "PROCALCITON", "LATICACIDVEN" in the last 168 hours.   Recent Results (from the past 240 hour(s))  MRSA Next Gen by PCR, Nasal     Status: Abnormal   Collection Time: 01/11/23  7:42 PM   Specimen: Nasal Mucosa; Nasal Swab  Result Value Ref Range Status   MRSA by PCR Next Gen DETECTED (A) NOT DETECTED Final    Comment: RESULT CALLED TO, READ BACK BY AND VERIFIED WITH: [facility] WHITE RN @ 2139 01/11/23 LFD (NOTE) The GeneXpert MRSA Assay (FDA approved for NASAL specimens only), is one component of a comprehensive MRSA colonization surveillance program. It is not intended to diagnose MRSA infection nor to guide or monitor treatment for MRSA infections. Test performance is not FDA approved in patients less than 17 years old. Performed at [facility], [address], [place], [place] [address]          Radiology Studies: No results found.      Scheduled Meds:  aspirin EC  81 mg Oral Daily   bisoprolol  5 mg Oral Daily   clonazepam  0.125 mg Oral TID   DULoxetine  60 mg Oral Daily   enoxaparin  (LOVENOX) injection  40 mg Subcutaneous Q24H   famotidine  20 mg Oral Daily   free water  200 mL Oral Q6H   influenza vaccine adjuvanted  0.5 mL Intramuscular Tomorrow-1000   losartan  100 mg Oral Daily   OLANZapine  2.5 mg Oral BID   OXcarbazepine  150 mg Oral q AM   OXcarbazepine  300 mg Oral QHS   rivastigmine  1.5 mg Oral BID   spironolactone  12.5 mg Oral Daily   Continuous Infusions:   LOS: 12 days      Charise Killian, MD [facility] Pager [phone]  If 7PM-7AM, please contact night-coverage [email or web address]  01/20/2023, 8:12 AM

## 2023-01-20 NOTE — Progress Notes (Signed)
Influenza Vaccine scheduled for administration on MAR today.   Patient unable to answer questions related to influenza vaccine to consent or to advise if she has or has not received the vaccine or any history of allergic reaction.  RN called patient's sister Jackie Plum to discuss influenza vaccine.  Patient's sister advised she believes patient already received the influenza vaccine this year and advised she did not want patient to receive the vaccine at this time as she would need to confirm if the vaccine has or has not already been administered prior to hospital admission.

## 2023-01-21 DIAGNOSIS — J189 Pneumonia, unspecified organism: Secondary | ICD-10-CM | POA: Diagnosis not present

## 2023-01-21 DIAGNOSIS — Z515 Encounter for palliative care: Secondary | ICD-10-CM | POA: Diagnosis not present

## 2023-01-21 DIAGNOSIS — J9601 Acute respiratory failure with hypoxia: Secondary | ICD-10-CM | POA: Diagnosis not present

## 2023-01-21 DIAGNOSIS — F03918 Unspecified dementia, unspecified severity, with other behavioral disturbance: Secondary | ICD-10-CM | POA: Diagnosis not present

## 2023-01-21 LAB — BASIC METABOLIC PANEL
Anion gap: 13 (ref 5–15)
BUN: 39 mg/dL — ABNORMAL HIGH (ref 8–23)
CO2: 35 mmol/L — ABNORMAL HIGH (ref 22–32)
Calcium: 9.1 mg/dL (ref 8.9–10.3)
Chloride: 99 mmol/L (ref 98–111)
Creatinine, Ser: 1.3 mg/dL — ABNORMAL HIGH (ref 0.44–1.00)
GFR, Estimated: 41 mL/min — ABNORMAL LOW (ref >60)
Glucose, Bld: 136 mg/dL — ABNORMAL HIGH (ref 70–99)
Potassium: 4.2 mmol/L (ref 3.5–5.1)
Sodium: 147 mmol/L — ABNORMAL HIGH (ref 135–145)

## 2023-01-21 NOTE — TOC Progression Note (Signed)
Transition of Care [facility]) - Progression Note    Patient Details  Name: Samantha Carpenter MRN: [id number] Date of Birth: May 23, 1940  Transition of Care [facility]) CM/SW Contact  Truddie Hidden, RN Phone Number: 01/21/2023, 3:28 PM  Clinical Narrative:    Spoke with patient's sister [name]. She stated she had talked with several people today. She was under the impression patient would discharge back to [facility]. She as advised of bed offer for [facility] and pending auth. She was also advised patient needed to be sitter free prior to admitting to [facility]. She stated she did not want [facility]. She was advised this is the only bed offer patient.She stated she would follow up with [facility].   Attempt to reach [place], No answer. Left a message to see if patient can return to facility.     Expected Discharge Plan: Skilled Nursing Facility Barriers to Discharge: Continued Medical Work up  Expected Discharge Plan and Services       Living arrangements for the past 2 months: Assisted Living Facility                                       Social Determinants of Health (SDOH) Interventions SDOH Screenings   Food Insecurity: Patient Unable To Answer (01/11/2023)  Housing: Patient Unable To Answer (01/11/2023)  Transportation Needs: No Transportation Needs (11/07/2022)  Utilities: Not At Risk (11/07/2022)  Financial Resource Strain: Low Risk  (09/02/2018)  Physical Activity: Sufficiently Active (09/02/2018)  Social Connections: Unknown (09/02/2018)  Stress: No Stress Concern Present (09/02/2018)  Tobacco Use: Medium Risk (01/08/2023)    Readmission Risk Interventions    01/11/2022    2:05 PM 01/09/2022    2:23 PM  Readmission Risk Prevention Plan  Transportation Screening  Complete  HRI or Home Care Consult Complete   Palliative Care Screening  Not Applicable  Medication Review (RN Care Manager)  Complete

## 2023-01-21 NOTE — Progress Notes (Signed)
Occupational Therapy Treatment Patient Details Name: Samantha Carpenter MRN: [id number] DOB: [birth year] Today's Date: 01/21/2023   History of present illness Patient is a 82 year old female coming [facility] [place] [facility] with fall, altered mental status. Found to have acute hypoxic respiratory failure with COPD exacerbation. History of dementia, frequent falls, COPD, chronic diastolic heart failure, CKD stage III A, hypertension, hyperlipidemia.   OT comments  Chart reviewed to date, pt seen as a co tx with PT to maximize functional performance. Tx session targeted improving functional activity tolerance, performance in ADL tasks/functional mobility. Pt is alert, pleasant and follows commands with increased time and multi modal cues. Pt requires increased assist as compared to previous session in bed mobility, transfer attempts. Pt unable to appropriately grasp RW with R hand with R hand falling off without HOH A. Please see UE assessment for further details. MAX A required for LB dressing. MOD-MAX A for SPT transfers to bedside chair<>bed. Palliative NP in room during mobility.Nurse notified of findings.  OT will follow acutely.       If plan is discharge home, recommend the following:  A lot of help with walking and/or transfers;A lot of help with bathing/dressing/bathroom;Supervision due to cognitive status;Direct supervision/assist for medications management;Assistance with cooking/housework;Help with stairs or ramp for entrance   Equipment Recommendations  Other (comment) (per next venue of care)    Recommendations for Other Services      Precautions / Restrictions Precautions Precautions: Fall Restrictions Weight Bearing Restrictions: No       Mobility Bed Mobility Overal bed mobility: Needs Assistance Bed Mobility: Supine to Sit, Sit to Supine     Supine to sit: Mod assist, Max assist, HOB elevated (step by step multi modal cues;) Sit to supine: Max assist, Used rails, HOB elevated         Transfers Overall transfer level: Needs assistance Equipment used: Rolling walker (2 wheels), 2 person hand held assist Transfers: Sit to/from Stand, Bed to chair/wheelchair/BSC Sit to Stand: Mod assist, Max assist, +2 safety/equipment Stand pivot transfers: Max assist, +2 physical assistance         General transfer comment: attempted step pivot with RW, pt unable to advance RLE with unsafe technique therefore performed SPT; step by step multi modal cues required throughout     Balance Overall balance assessment: Needs assistance Sitting-balance support: Feet supported Sitting balance-Leahy Scale: Fair     Standing balance support: Bilateral upper extremity supported Standing balance-Leahy Scale: Poor                             ADL either performed or assessed with clinical judgement   ADL Overall ADL's : Needs assistance/impaired                     Lower Body Dressing: Maximal assistance;Sitting/lateral leans Lower Body Dressing Details (indicate cue type and reason): attempts to donn socks, ?perception/vision Toilet Transfer: Moderate assistance;Maximal assistance;+2 for physical assistance;+2 for safety/equipment   Toileting- Clothing Manipulation and Hygiene: Total assistance;Bed level              Extremity/Trunk Assessment Upper Extremity Assessment Upper Extremity Assessment: RUE deficits/detail;Difficult to assess due to impaired cognition RUE Deficits / Details: noted RUE deficits- AROM: shoulder flexion to approx 1/2 full AROM, elbow/wrist appear grossly WFL; weak grip strength with pt unable to grip on to walker without HOH A; PROM appears grossly [facility]   Lower Extremity Assessment Lower Extremity Assessment: RLE  deficits/detail RLE Deficits / Details: requires assist to progress RLE during transfer        Vision       Perception     Praxis      Cognition Arousal: Alert Behavior During Therapy: Flat affect Overall  Cognitive Status: No family/caregiver present to determine baseline cognitive functioning Area of Impairment: Orientation, Attention, Memory, Following commands, Safety/judgement, Awareness, Problem solving                 Orientation Level: Disoriented to, Place, Time, Situation Current Attention Level: Focused Memory: Decreased short-term memory Following Commands: Follows one step commands with increased time (and multi modal cueing) Safety/Judgement: Decreased awareness of safety, Decreased awareness of deficits Awareness: Intellectual Problem Solving: Slow processing, Difficulty sequencing, Requires verbal cues, Requires tactile cues, Decreased initiation          Exercises Other Exercises Other Exercises: edu re: role of OT    Shoulder Instructions       General Comments mitts removed for session; donned at end of session, SPo2 appears >90% on 3L via [place] when monitored prior to session    Pertinent Vitals/ Pain       Pain Assessment Pain Assessment: PAINAD Breathing: normal Negative Vocalization: none Facial Expression: smiling or inexpressive Body Language: relaxed Consolability: no need to console PAINAD Score: 0  Home Living                                          Prior Functioning/Environment              Frequency  Min 1X/week        Progress Toward Goals  OT Goals(current goals can now be found in the care plan section)  Progress towards OT goals: Progressing toward goals     Plan      Co-evaluation    PT/OT/SLP Co-Evaluation/Treatment: Yes Reason for Co-Treatment: Complexity of the patient's impairments (multi-system involvement);For patient/therapist safety;To address functional/ADL transfers   OT goals addressed during session: ADL's and self-care      AM-PAC OT "6 Clicks" Daily Activity     Outcome Measure   Help from another person eating meals?: A Lot Help from another person taking care of personal  grooming?: A Lot Help from another person toileting, which includes using toliet, bedpan, or urinal?: Total Help from another person bathing (including washing, rinsing, drying)?: Total Help from another person to put on and taking off regular upper body clothing?: A Lot Help from another person to put on and taking off regular lower body clothing?: A Lot 6 Click Score: 10    End of Session Equipment Utilized During Treatment: Oxygen;Gait belt;Rolling walker (2 wheels)  OT Visit Diagnosis: Unsteadiness on feet (R26.81);Other abnormalities of gait and mobility (R26.89);Repeated falls (R29.6);Muscle weakness (generalized) (M62.81);History of falling (Z91.81)   Activity Tolerance Patient tolerated treatment well   Patient Left in bed;with call bell/phone within reach;with bed alarm set   Nurse Communication Mobility status (RUE/LE weakness)        Time: 2956-2130 OT Time Calculation (min): 28 min  Charges: OT General Charges $OT Visit: 1 Visit OT Treatments $Self Care/Home Management : 8-22 mins  Oleta Mouse, OTD OTR/L  01/21/23, 12:02 PM

## 2023-01-21 NOTE — Care Management Important Message (Signed)
Important Message  Patient Details  Name: Samantha Carpenter MRN: [id number] Date of Birth: Mar 08, 1941   Important Message Given:  Yes - Medicare IM  I reviewed the Important Message from Medicare with the patient's legal guardian, Robert Bellow by phone [phone].She thought the plan was for the patient to return to [facility] vs. [facility]. I transferred the call to the [facility] for them to discuss and thanked her for her time.   Olegario Messier A Vesna Kable 01/21/2023, 3:39 PM

## 2023-01-21 NOTE — Progress Notes (Signed)
Physical Therapy Treatment Patient Details Name: Samantha Carpenter MRN: [id number] DOB: [birth year] Today's Date: 01/21/2023   History of Present Illness Patient is a 82 year old female coming [facility] with fall, altered mental status. Found to have acute hypoxic respiratory failure with COPD exacerbation. History of dementia, frequent falls, COPD, chronic diastolic heart failure, CKD stage III A, hypertension, hyperlipidemia.    PT Comments  Pt is making gradual progress towards goals, limited by cognition. Noted R hemibody strength, discussed with RN and NP. Pt able to perform transfer to/from chair with +2 assist. Unable to accurately obtain pulse ox, however all mobility performed on 3L of O2. Slight wheezing noted. Left in chair position of bed at end of session. CO-treatment performed with OT. Will continue to progress as able.    If plan is discharge home, recommend the following: A lot of help with walking and/or transfers;A lot of help with bathing/dressing/bathroom;Assist for transportation;Help with stairs or ramp for entrance;Assistance with cooking/housework;Direct supervision/assist for medications management;Direct supervision/assist for financial management;Supervision due to cognitive status   Can travel by private vehicle     No  Equipment Recommendations  Other (comment)    Recommendations for Other Services       Precautions / Restrictions Precautions Precautions: Fall Restrictions Weight Bearing Restrictions: No     Mobility  Bed Mobility Overal bed mobility: Needs Assistance Bed Mobility: Supine to Sit, Sit to Supine     Supine to sit: Mod assist, Max assist, HOB elevated Sit to supine: Max assist, Used rails, HOB elevated   General bed mobility comments: heavy cues for sequencing. +2 for parts of bed mobility. Once seated at EOB, noted upright posture    Transfers Overall transfer level: Needs assistance Equipment used: Rolling walker (2 wheels), 2 person  hand held assist Transfers: Sit to/from Stand, Bed to chair/wheelchair/BSC Sit to Stand: Mod assist, Max assist, +2 safety/equipment Stand pivot transfers: Max assist, +2 physical assistance         General transfer comment: attempted step pivot with RW, pt unable to advance RLE with unsafe technique therefore performed SPT; step by step multi modal cues required throughout. Gait belt used    Ambulation/Gait               General Gait Details: not safe to perform at this time due to poor righting reactions, decreased R hemi body strength, and poor cognition   Stairs             Wheelchair Mobility     Tilt Bed    Modified Rankin (Stroke Patients Only)       Balance Overall balance assessment: Needs assistance Sitting-balance support: Feet supported Sitting balance-Leahy Scale: Fair     Standing balance support: Bilateral upper extremity supported Standing balance-Leahy Scale: Poor                              Cognition Arousal: Alert Behavior During Therapy: Flat affect Overall Cognitive Status: No family/caregiver present to determine baseline cognitive functioning Area of Impairment: Orientation, Attention, Memory, Following commands, Safety/judgement, Awareness, Problem solving                               General Comments: slow processing, minimal verbal interactions, however follows commands more consistently than expected. Pleasant and agreeable to session        Exercises Other Exercises Other Exercises: upon  arrival, CNA assisted with cleaning pt secondary to BM. Rolling performed and new bed change noted. Additional bed linen changed secondary to additional urine incontience. Other Exercises: Once seated at EOB, attempted LAQ and shoulder flexion. R hemibody < L hemibody    General Comments General comments (skin integrity, edema, etc.): mitts on pre/post session. Poor pleth noted with finger ox and dynamap. All  mobility performed on 3L of O2      Pertinent Vitals/Pain Pain Assessment Pain Assessment: No/denies pain    Home Living                          Prior Function            PT Goals (current goals can now be found in the care plan section) Acute Rehab PT Goals Patient Stated Goal: none stated PT Goal Formulation: With patient Time For Goal Achievement: 01/23/23 Potential to Achieve Goals: Poor Progress towards PT goals: Progressing toward goals    Frequency    Min 1X/week      PT Plan      Co-evaluation PT/OT/SLP Co-Evaluation/Treatment: Yes Reason for Co-Treatment: Complexity of the patient's impairments (multi-system involvement);For patient/therapist safety;To address functional/ADL transfers PT goals addressed during session: Mobility/safety with mobility;Balance;Proper use of DME;Strengthening/ROM OT goals addressed during session: ADL's and self-care      AM-PAC PT "6 Clicks" Mobility   Outcome Measure  Help needed turning from your back to your side while in a flat bed without using bedrails?: A Lot Help needed moving from lying on your back to sitting on the side of a flat bed without using bedrails?: A Lot Help needed moving to and from a bed to a chair (including a wheelchair)?: A Lot Help needed standing up from a chair using your arms (e.g., wheelchair or bedside chair)?: A Lot Help needed to walk in hospital room?: Total Help needed climbing 3-5 steps with a railing? : Total 6 Click Score: 10    End of Session Equipment Utilized During Treatment: Oxygen;Gait belt Activity Tolerance: Patient tolerated treatment well;Patient limited by fatigue Patient left: in bed;with bed alarm set Nurse Communication: Mobility status PT Visit Diagnosis: Muscle weakness (generalized) (M62.81);Unsteadiness on feet (R26.81)     Time: 6237-6283 PT Time Calculation (min) (ACUTE ONLY): 37 min  Charges:    $Therapeutic Activity: 8-22 mins PT General  Charges $$ ACUTE PT VISIT: 1 Visit                     Elizabeth Palau, PT, DPT, GCS [phone]    Surafel Hilleary 01/21/2023, 2:13 PM

## 2023-01-21 NOTE — Progress Notes (Signed)
PROGRESS NOTE    Samantha Carpenter  ZOX:[id number] DOB: 01-11-1941 DOA: 01/08/2023 PCP: Pcp, No    Assessment & Plan:   Principal Problem:   Acute hypoxic respiratory failure (HCC) Active Problems:   COPD with acute exacerbation (HCC)   CAP (community acquired pneumonia)   CKD stage 3a, GFR 45-59 ml/min (HCC)   Dementia with behavioral disturbance (HCC)   Chronic diastolic CHF (congestive heart failure) (HCC)   HTN, goal below 140/80   Prediabetes   Falls frequently  Assessment and Plan: Acute hypoxic and hypercapnic respiratory failure: likely secondary to CAP, COPD & CHF. Continue on supplemental oxygen and wean as tolerated. Was on BiPAP but has since been weaned off   COPD exacerbation: completed abx & steroid course. Bronchodilators prn    CAP: completed abx course. Bronchodilators prn    Acute on chronic diastolic CHF: holding lasix. Monitor I/Os    Dementia with behavioral disturbance: continue w/ supportive care. Haldol prn. Tapering home klonopin  Dysphagia: likely secondary to dementia. Diet changed to dysphagia I diet as per speech    CKDIIIa: Cr is labile. Avoid nephrotoxic meds    HTN: continue on aldactone, bisoprolol, aldactone. IV hydralazine prn   Prediabetes: HbA1c 5.8. Unable to comprehend preDM education    Falls frequently: likely secondary to dementia. PT/OT recs SNF    Hypokalemia: WNL today    Hypernatremia: labile. Continue w/ free water flushes    Metabolic alkalosis: etiology unclear. Stable from day prior      DVT prophylaxis: lovenox  Code Status: DNR Family Communication:  Disposition Plan: needs SNF placement.  Level of care: Progressive Status is: Inpatient Remains inpatient appropriate because: medically stable. Mental status is very poor. Waiting on insurance auth as per CM     Consultants:    Procedures:  Antimicrobials:   Subjective: Pt is still pleasantly confused   Objective: Vitals:   01/20/23 1920 01/21/23  0433 01/21/23 0757 01/21/23 1125  BP: (!) 148/97 (!) 149/86 (!) 196/91 (!) 162/57  Pulse: 67 72 (!) 105 70  Resp: 20 18 18 18   Temp: 98.6 F (37 C) 98.6 F (37 C) 98.4 F (36.9 C) 97.8 F (36.6 C)  TempSrc: Oral     SpO2: 93% 97%    Weight:      Height:        Intake/Output Summary (Last 24 hours) at 01/21/2023 1500 Last data filed at 01/21/2023 1300 Gross per 24 hour  Intake 420 ml  Output 0 ml  Net 420 ml   Filed Weights   01/08/23 1223  Weight: 68 kg    Examination:  General exam: appears confused  Respiratory system: diminished breath sounds b/l  Cardiovascular system: S1/S2+. No rubs or clicks  Gastrointestinal system: abd is soft, NT, ND & hypoactive bowel sounds  Central nervous system: alert & awake  Psychiatry: judgement and insight appears poor    Data Reviewed: I have personally reviewed following labs and imaging studies  CBC: Recent Labs  Lab 01/16/23 0501 01/18/23 0712  WBC 9.6 8.4  HGB 13.1 10.3*  HCT 42.4 32.8*  MCV 100.0 100.9*  PLT 419* 245   Basic Metabolic Panel: Recent Labs  Lab 01/16/23 0501 01/17/23 0616 01/18/23 0712 01/19/23 0605 01/20/23 0447 01/21/23 0443  NA 149* 147* 145 146* 146* 147*  K 3.3* 3.4* 3.7 4.2 4.3 4.2  CL 99 99 98 99 99 99  CO2 40* 39* 38* 38* 38* 35*  GLUCOSE 164* 158* 158* 136* 132* 136*  BUN 46* 45* 43* 44* 41* 39*  CREATININE 1.10* 1.30* 1.41* 1.31* 1.11* 1.30*  CALCIUM 9.0 8.6* 8.6* 8.7* 9.0 9.1  MG 2.6*  --  2.3  --   --   --    GFR: Estimated Creatinine Clearance: 31.1 mL/min (A) (by C-G formula based on SCr of 1.3 mg/dL (H)). Liver Function Tests: No results for input(s): "AST", "ALT", "ALKPHOS", "BILITOT", "PROT", "ALBUMIN" in the last 168 hours. No results for input(s): "LIPASE", "AMYLASE" in the last 168 hours. No results for input(s): "AMMONIA" in the last 168 hours. Coagulation Profile: No results for input(s): "INR", "PROTIME" in the last 168 hours. Cardiac Enzymes: No results for  input(s): "CKTOTAL", "CKMB", "CKMBINDEX", "TROPONINI" in the last 168 hours. BNP (last 3 results) No results for input(s): "PROBNP" in the last 8760 hours. HbA1C: No results for input(s): "HGBA1C" in the last 72 hours. CBG: No results for input(s): "GLUCAP" in the last 168 hours. Lipid Profile: No results for input(s): "CHOL", "HDL", "LDLCALC", "TRIG", "CHOLHDL", "LDLDIRECT" in the last 72 hours. Thyroid Function Tests: No results for input(s): "TSH", "T4TOTAL", "FREET4", "T3FREE", "THYROIDAB" in the last 72 hours. Anemia Panel: No results for input(s): "VITAMINB12", "FOLATE", "FERRITIN", "TIBC", "IRON", "RETICCTPCT" in the last 72 hours. Sepsis Labs: No results for input(s): "PROCALCITON", "LATICACIDVEN" in the last 168 hours.   Recent Results (from the past 240 hour(s))  MRSA Next Gen by PCR, Nasal     Status: Abnormal   Collection Time: 01/11/23  7:42 PM   Specimen: Nasal Mucosa; Nasal Swab  Result Value Ref Range Status   MRSA by PCR Next Gen DETECTED (A) NOT DETECTED Final    Comment: RESULT CALLED TO, READ BACK BY AND VERIFIED WITH: [facility] WHITE RN @ 2139 01/11/23 LFD (NOTE) The GeneXpert MRSA Assay (FDA approved for NASAL specimens only), is one component of a comprehensive MRSA colonization surveillance program. It is not intended to diagnose MRSA infection nor to guide or monitor treatment for MRSA infections. Test performance is not FDA approved in patients less than 8 years old. Performed at [facility], [address] [place], [place] [address]          Radiology Studies: No results found.      Scheduled Meds:  aspirin EC  81 mg Oral Daily   bisoprolol  5 mg Oral Daily   clonazepam  0.125 mg Oral TID   DULoxetine  60 mg Oral Daily   enoxaparin (LOVENOX) injection  40 mg Subcutaneous Q24H   famotidine  20 mg Oral Daily   free water  200 mL Oral Q6H   influenza vaccine adjuvanted  0.5 mL Intramuscular Tomorrow-1000   losartan  100 mg Oral  Daily   OLANZapine  2.5 mg Oral BID   mouth rinse  15 mL Mouth Rinse 4 times per day   OXcarbazepine  150 mg Oral q AM   OXcarbazepine  300 mg Oral QHS   rivastigmine  1.5 mg Oral BID   spironolactone  12.5 mg Oral Daily   Continuous Infusions:   LOS: 13 days      Charise Killian, MD [facility] Pager [phone]  If 7PM-7AM, please contact night-coverage [email or web address]  01/21/2023, 3:00 PM

## 2023-01-21 NOTE — Progress Notes (Signed)
                                                     Palliative Care Progress Note, Assessment & Plan   Patient Name: Samantha Carpenter       Date: 01/21/2023 DOB: 07-09-1940  Age: 82 y.o. MRN#: [id number] Attending Physician: Charise Killian, MD Primary Care Physician: Pcp, No Admit Date: 01/08/2023  Subjective: Patient is sitting at edge of bed with physical therapist present.  No family or friends present during my visit.  HPI: 82 y.o. female  with past medical history of dementia with frequent falls, COPD, dCHF, CKD stage 3a, HTN, HLD admitted from [facility] on 01/08/2023 with AMS with associated SHOB and cough.   Admitted and being treated for COPD exacerbation with underlying pneumonia as well as CHF exacerbation.  Has required intermittent BiPAP support   Palliative medicine was consulted for assisting with goals of care conversations.  Summary of counseling/coordination of care: Extensive chart review completed prior to meeting patient including labs, vital signs, imaging, progress notes, orders, and available advanced directive documents from current and previous encounters.   After reviewing the patient's chart and assessing the patient at bedside, I spoke with the patient and therapist at bedside in regards to symptom management.  Patient is much more alert and compliant with therapies today.  She follows instructions to stand up at bedside, with some right-sided weakness.  She is unable to engage in discussions about the whether or how she is feeling.  Patient not able to participate in goals of care medical decision making independently.  She denies pain or discomfort at this time.  No adjustment to [facility] needed.  PMT remains available to patient and family throughout her hospitalization.  Physical Exam Vitals reviewed.  Constitutional:       General: She is not in acute distress.    Appearance: She is normal weight.  HENT:     Head: Normocephalic.     Mouth/Throat:     Mouth: Mucous membranes are moist.  Eyes:     Pupils: Pupils are equal, round, and reactive to light.  Cardiovascular:     Rate and Rhythm: Normal rate.  Pulmonary:     Effort: Pulmonary effort is normal.  Abdominal:     Palpations: Abdomen is soft.  Neurological:     Mental Status: She is alert.     Comments: Oriented to self  Psychiatric:        Mood and Affect: Mood normal.        Behavior: Behavior normal.             Total Time 25 minutes   Time spent includes: Detailed review of medical records (labs, imaging, vital signs), medically appropriate exam (mental status, respiratory, cardiac, skin), discussed with treatment team, counseling and educating patient, family and staff, documenting clinical information, medication management and coordination of care.  Samara Deist L. Bonita Quin, DNP, FNP-BC Palliative Medicine Team

## 2023-01-22 DIAGNOSIS — F03918 Unspecified dementia, unspecified severity, with other behavioral disturbance: Secondary | ICD-10-CM | POA: Diagnosis not present

## 2023-01-22 DIAGNOSIS — J9601 Acute respiratory failure with hypoxia: Secondary | ICD-10-CM | POA: Diagnosis not present

## 2023-01-22 DIAGNOSIS — Z515 Encounter for palliative care: Secondary | ICD-10-CM | POA: Diagnosis not present

## 2023-01-22 LAB — BASIC METABOLIC PANEL
Anion gap: 12 (ref 5–15)
BUN: 37 mg/dL — ABNORMAL HIGH (ref 8–23)
CO2: 36 mmol/L — ABNORMAL HIGH (ref 22–32)
Calcium: 9.2 mg/dL (ref 8.9–10.3)
Chloride: 96 mmol/L — ABNORMAL LOW (ref 98–111)
Creatinine, Ser: 1.19 mg/dL — ABNORMAL HIGH (ref 0.44–1.00)
GFR, Estimated: 45 mL/min — ABNORMAL LOW (ref >60)
Glucose, Bld: 138 mg/dL — ABNORMAL HIGH (ref 70–99)
Potassium: 4.5 mmol/L (ref 3.5–5.1)
Sodium: 144 mmol/L (ref 135–145)

## 2023-01-22 NOTE — Progress Notes (Signed)
PROGRESS NOTE   HPI was taken from Dr. Huel Cote:  HPI: Samantha Carpenter is a 82 y.o. female with medical history significant of dementia complicated by frequent falls, COPD, chronic diastolic heart failure, CKD stage III A, hypertension, hyperlipidemia who presents to the ED due to a altered mental status.   History is limited by patient's underlying dementia.  She states that she came in today due to a fall but cannot recall the events surrounding the fall or how it happened.  She also endorses shortness of breath with cough and states has been happening for a while now.  When asking any other review of system questions, patient replies yes to each 1 including chest pain, nausea, vomiting, diarrhea, abdominal pain etc.   ED course: On arrival to the ED, patient was hypertensive at 152/102 with increased to as high as 212/69.  She is saturating at 77% on room air and placed on 3 L with minimal improvement, subsequently requiring a nonrebreather.  She has now been weaned to 2 L with stable oxygen saturation at 99%.  Afebrile at 97.7.  Initial workup notable for WBC of 10.6, BUN of 25, creatinine 1.19 with GFR 45.  Lactic acid 1.3.  COVID-19, influenza and RSV PCR negative.  UA with no infectious signs.   Chest x-ray was obtained with concern for patchy left retrocardiac opacity that may be infectious in etiology in addition to possible small left pleural effusion versus atelectasis and potential distal left clavicle fracture.  CT head and CT of the C-spine were obtained and still pending.  Patient started on vancomycin, Rocephin and DuoNebs.  TRH contacted for admission.  As per Dr. Mayford Knife 10/30-11/5/24: Pt has dementia. Medically stable this week. Waiting on SNF placement and insurance auth. See CM notes.    Samantha Carpenter  UJW:[id number] DOB: 04/18/1940 DOA: 01/08/2023 PCP: Pcp, No    Assessment & Plan:   Principal Problem:   Acute hypoxic respiratory failure (HCC) Active Problems:   COPD  with acute exacerbation (HCC)   CAP (community acquired pneumonia)   CKD stage 3a, GFR 45-59 ml/min (HCC)   Dementia with behavioral disturbance (HCC)   Chronic diastolic CHF (congestive heart failure) (HCC)   HTN, goal below 140/80   Prediabetes   Falls frequently  Assessment and Plan: Acute hypoxic and hypercapnic respiratory failure: likely secondary to CAP, COPD & CHF. Continue on supplemental oxygen and wean as tolerated. Was on BiPAP but has since been weaned off   COPD exacerbation: completed abx & steroid course. Bronchodilators prn    CAP: completed abx course. Bronchodilators prn    Acute on chronic diastolic CHF: holding lasix. Monitor I/Os   Dementia with behavioral disturbance: continue w/ supportive care. Haldol prn. Tapering home klonopin  Dysphagia: likely secondary to dementia. Continue on dysphagia I diet as per speech    CKDIIIa: Cr is labile. Avoid nephrotoxic meds    HTN: continue on aldactone, bisoprolol, aldactone. IV hydralazine prn   Prediabetes: HbA1c 5.8. Unable to comprehend preDM education    Falls frequently: likely secondary to dementia. PT/OT recs SNF    Hypokalemia:WNL today    Hypernatremia: resolved   Metabolic alkalosis: etiology unclear. Stable from day prior      DVT prophylaxis: lovenox  Code Status: DNR Family Communication:  Disposition Plan: needs SNF placement.  Level of care: Med-Surg Status is: Inpatient Remains inpatient appropriate because: medically stable. Mental status is very poor. Waiting on insurance auth as per CM     Consultants:  Procedures:  Antimicrobials:   Subjective: Pt is pleasantly confused    Objective: Vitals:   01/21/23 2314 01/22/23 0308 01/22/23 0803 01/22/23 1130  BP: (!) 125/35 (!) 150/59 (!) 196/71 (!) 150/60  Pulse: 63 74 75 64  Resp: 18 20 18 20   Temp: 98.8 F (37.1 C) 98.1 F (36.7 C) 98.4 F (36.9 C) 98.7 F (37.1 C)  TempSrc: Axillary     SpO2: 96% 97% 98% 100%   Weight:      Height:        Intake/Output Summary (Last 24 hours) at 01/22/2023 1537 Last data filed at 01/21/2023 1800 Gross per 24 hour  Intake 0 ml  Output --  Net 0 ml   Filed Weights   01/08/23 1223  Weight: 68 kg    Examination:  General exam: appears calm but confused Respiratory system: decreased breath sounds b/l  Cardiovascular system: S1 & S2+. No rubs or clicks  Gastrointestinal system: abd is soft, NT, ND & normal bowel sounds  Central nervous system: alert & awake  Psychiatry: judgement and insight appear poor     Data Reviewed: I have personally reviewed following labs and imaging studies  CBC: Recent Labs  Lab 01/16/23 0501 01/18/23 0712  WBC 9.6 8.4  HGB 13.1 10.3*  HCT 42.4 32.8*  MCV 100.0 100.9*  PLT 419* 245   Basic Metabolic Panel: Recent Labs  Lab 01/16/23 0501 01/17/23 0616 01/18/23 0712 01/19/23 0605 01/20/23 0447 01/21/23 0443 01/22/23 0635  NA 149*   < > 145 146* 146* 147* 144  K 3.3*   < > 3.7 4.2 4.3 4.2 4.5  CL 99   < > 98 99 99 99 96*  CO2 40*   < > 38* 38* 38* 35* 36*  GLUCOSE 164*   < > 158* 136* 132* 136* 138*  BUN 46*   < > 43* 44* 41* 39* 37*  CREATININE 1.10*   < > 1.41* 1.31* 1.11* 1.30* 1.19*  CALCIUM 9.0   < > 8.6* 8.7* 9.0 9.1 9.2  MG 2.6*  --  2.3  --   --   --   --    < > = values in this interval not displayed.   GFR: Estimated Creatinine Clearance: 33.9 mL/min (A) (by C-G formula based on SCr of 1.19 mg/dL (H)). Liver Function Tests: No results for input(s): "AST", "ALT", "ALKPHOS", "BILITOT", "PROT", "ALBUMIN" in the last 168 hours. No results for input(s): "LIPASE", "AMYLASE" in the last 168 hours. No results for input(s): "AMMONIA" in the last 168 hours. Coagulation Profile: No results for input(s): "INR", "PROTIME" in the last 168 hours. Cardiac Enzymes: No results for input(s): "CKTOTAL", "CKMB", "CKMBINDEX", "TROPONINI" in the last 168 hours. BNP (last 3 results) No results for input(s):  "PROBNP" in the last 8760 hours. HbA1C: No results for input(s): "HGBA1C" in the last 72 hours. CBG: No results for input(s): "GLUCAP" in the last 168 hours. Lipid Profile: No results for input(s): "CHOL", "HDL", "LDLCALC", "TRIG", "CHOLHDL", "LDLDIRECT" in the last 72 hours. Thyroid Function Tests: No results for input(s): "TSH", "T4TOTAL", "FREET4", "T3FREE", "THYROIDAB" in the last 72 hours. Anemia Panel: No results for input(s): "VITAMINB12", "FOLATE", "FERRITIN", "TIBC", "IRON", "RETICCTPCT" in the last 72 hours. Sepsis Labs: No results for input(s): "PROCALCITON", "LATICACIDVEN" in the last 168 hours.   No results found for this or any previous visit (from the past 240 hour(s)).        Radiology Studies: No results found.  Scheduled Meds:  aspirin EC  81 mg Oral Daily   bisoprolol  5 mg Oral Daily   clonazepam  0.125 mg Oral TID   DULoxetine  60 mg Oral Daily   enoxaparin (LOVENOX) injection  40 mg Subcutaneous Q24H   famotidine  20 mg Oral Daily   free water  200 mL Oral Q6H   influenza vaccine adjuvanted  0.5 mL Intramuscular Tomorrow-1000   losartan  100 mg Oral Daily   OLANZapine  2.5 mg Oral BID   mouth rinse  15 mL Mouth Rinse 4 times per day   OXcarbazepine  150 mg Oral q AM   OXcarbazepine  300 mg Oral QHS   rivastigmine  1.5 mg Oral BID   spironolactone  12.5 mg Oral Daily   Continuous Infusions:   LOS: 14 days      Charise Killian, MD [facility] Pager [phone]  If 7PM-7AM, please contact night-coverage [email or web address]  01/22/2023, 3:37 PM

## 2023-01-22 NOTE — TOC Progression Note (Addendum)
Transition of Care [facility]) - Progression Note    Patient Details  Name: Samantha Carpenter MRN: [id number] Date of Birth: 28-Jul-1940  Transition of Care [facility]) CM/SW Contact  Truddie Hidden, RN Phone Number: 01/22/2023, 10:35 AM  Clinical Narrative:    RNCM spoke with Samantha Carpenter, Admissions Coordinator for [facility] [place] approx 4:47pm 01/21/2023 regarding patient's return to the facility and the family's request. He is not opposed to patient's return providing she is able to be somewhat independent with mobility and progressive with therapy. RNCM will planned to review therapy's asseessement with Samantha Carpenter today.   Attempt to reach Samantha Carpenter at [facility] to discuss discharge plan x 3. No answer. Unable to leave a message.   10:46am Retrieved message from patient's sister Samantha Carpenter. Returned call to [phone] per request. RNCM spoke with [place]. She stated she had spoke with a nurse from [facility] [place] and was told the patient could return. RNCM advised Samantha Carpenter the Production designer, theatre/television/film, Samantha Carpenter would be in contact with this RNCM regarding his decision for patient's return. Samantha Carpenter was advised of patient's current recommendation from therapy as of yesterday and her level of assistance needed for sit to stand and distance walked. She is not agreeable to [facility]. RNCM reiterated facility's choice for her to return to [facility] as well as patient's right for appeal per CMS guideline. RNCM advised patient only bed offer was for [facility] and auth was pending at this time. Samantha Carpenter wanted to end the call to call Samantha Carpenter.     Expected Discharge Plan: Skilled Nursing Facility Barriers to Discharge: Continued Medical Work up  Expected Discharge Plan and Services       Living arrangements for the past 2 months: Assisted Living Facility                                       Social Determinants of Health (SDOH) Interventions SDOH Screenings   Food Insecurity: Patient Unable To Answer  (01/11/2023)  Housing: Patient Unable To Answer (01/11/2023)  Transportation Needs: No Transportation Needs (11/07/2022)  Utilities: Not At Risk (11/07/2022)  Financial Resource Strain: Low Risk  (09/02/2018)  Physical Activity: Sufficiently Active (09/02/2018)  Social Connections: Unknown (09/02/2018)  Stress: No Stress Concern Present (09/02/2018)  Tobacco Use: Medium Risk (01/08/2023)    Readmission Risk Interventions    01/11/2022    2:05 PM 01/09/2022    2:23 PM  Readmission Risk Prevention Plan  Transportation Screening  Complete  HRI or Home Care Consult Complete   Palliative Care Screening  Not Applicable  Medication Review (RN Care Manager)  Complete

## 2023-01-22 NOTE — Progress Notes (Signed)
Palliative Care Progress Note, Assessment & Plan   Patient Name: Samantha Carpenter       Date: 01/22/2023 DOB: 1940-10-01  Age: 82 y.o. MRN#: [id number] Attending Physician: Charise Killian, MD Primary Care Physician: Pcp, No Admit Date: 01/08/2023  Subjective: Patient is sitting up in bed with mitts in place.  She is asleep but easily awakens to my presence.  She is able to make her wishes known.  No family or friends present during my visit.  HPI: 82 y.o. female  with past medical history of dementia with frequent falls, COPD, dCHF, CKD stage 3a, HTN, HLD admitted from [facility] on 01/08/2023 with AMS with associated SHOB and cough.   Admitted and being treated for COPD exacerbation with underlying pneumonia as well as CHF exacerbation.  Has required intermittent BiPAP support   Palliative medicine was consulted for assisting with goals of care conversations.  Summary of counseling/coordination of care: Extensive chart review completed prior to meeting patient including labs, vital signs, imaging, progress notes, orders, and available advanced directive documents from current and previous encounters.   After reviewing the patient's chart and assessing the patient at bedside, I spoke with patient in regards to symptom management and plan of care.  Symptoms assessed.  Patient was able to endorse that she is not experience any pain or discomfort at this time.  She declines readjusting in the bed.  She endorses that all of her needs are currently met.  No adjustment to [facility] needed.  I attempted to elicit patient's understanding of her current medical situation.  She shares she does not know but she is happy where she is.  Patient unable to participate in goals of care medical decision making independently at  this time.  After meeting with patient, attempted to speak with her legal guardian/sister [name] over the phone.  No answer.  Mailbox was full and I was unable to leave a message.  As per chart review, TOC following closely for dispo planning.  DNR with limited interventions remains.  [name] has copy of MOST form has been encouraged multiple times during this hospitalization to complete it.  Symptom burden is low.  Goals are clear. Legal guardian has PMT contact info and has been encouraged to call with any acute palliative issues or concerns that arise during hospitalization.  Barrier to discharge is disposition.  PMT will step back and monitor the patient peripherally.  Please reengage with PMT if goals change, at patient/family's request, or if patient's health deteriorates prior to discharge.  Physical Exam Vitals reviewed.  Constitutional:      General: She is not in acute distress.    Appearance: She is normal weight.  HENT:     Head: Normocephalic.  Eyes:     Pupils: Pupils are equal, round, and reactive to light.  Cardiovascular:     Rate and Rhythm: Normal rate.  Pulmonary:     Effort: Pulmonary effort is normal.  Abdominal:     Palpations: Abdomen is soft.  Skin:    General: Skin is warm and dry.     Coloration: Skin is pale.  Neurological:     Mental Status: She is alert.  Comments: Oriented to self  Psychiatric:        Mood and Affect: Mood normal.             Total Time 25 minutes   Time spent includes: Detailed review of medical records (labs, imaging, vital signs), medically appropriate exam (mental status, respiratory, cardiac, skin), discussed with treatment team, counseling and educating patient, family and staff, documenting clinical information, medication management and coordination of care.  Samara Deist L. Bonita Quin, DNP, FNP-BC Palliative Medicine Team

## 2023-01-23 DIAGNOSIS — F03918 Unspecified dementia, unspecified severity, with other behavioral disturbance: Secondary | ICD-10-CM | POA: Diagnosis not present

## 2023-01-23 DIAGNOSIS — J441 Chronic obstructive pulmonary disease with (acute) exacerbation: Secondary | ICD-10-CM | POA: Diagnosis not present

## 2023-01-23 DIAGNOSIS — J9601 Acute respiratory failure with hypoxia: Secondary | ICD-10-CM | POA: Diagnosis not present

## 2023-01-23 DIAGNOSIS — J189 Pneumonia, unspecified organism: Secondary | ICD-10-CM | POA: Diagnosis not present

## 2023-01-23 LAB — BASIC METABOLIC PANEL
Anion gap: 11 (ref 5–15)
BUN: 37 mg/dL — ABNORMAL HIGH (ref 8–23)
CO2: 34 mmol/L — ABNORMAL HIGH (ref 22–32)
Calcium: 9 mg/dL (ref 8.9–10.3)
Chloride: 98 mmol/L (ref 98–111)
Creatinine, Ser: 1.41 mg/dL — ABNORMAL HIGH (ref 0.44–1.00)
GFR, Estimated: 37 mL/min — ABNORMAL LOW (ref >60)
Glucose, Bld: 157 mg/dL — ABNORMAL HIGH (ref 70–99)
Potassium: 3.9 mmol/L (ref 3.5–5.1)
Sodium: 143 mmol/L (ref 135–145)

## 2023-01-23 LAB — PHOSPHORUS: Phosphorus: 3.4 mg/dL (ref 2.5–4.6)

## 2023-01-23 LAB — MAGNESIUM: Magnesium: 2.3 mg/dL (ref 1.7–2.4)

## 2023-01-23 MED ORDER — FLUTICASONE FUROATE-VILANTEROL 200-25 MCG/ACT IN AEPB
1.0000 | INHALATION_SPRAY | Freq: Every day | RESPIRATORY_TRACT | Status: DC
  Filled 2023-01-23: qty 28

## 2023-01-23 MED ORDER — IPRATROPIUM-ALBUTEROL 0.5-2.5 (3) MG/3ML IN SOLN
3.0000 mL | Freq: Four times a day (QID) | RESPIRATORY_TRACT | Status: DC
  Administered 2023-01-23 – 2023-01-24 (×3): 3 mL via RESPIRATORY_TRACT
  Filled 2023-01-23 (×3): qty 3

## 2023-01-23 MED ORDER — HYDRALAZINE HCL 50 MG PO TABS: 50.0000 mg | ORAL_TABLET | Freq: Four times a day (QID) | ORAL | Status: DC | PRN

## 2023-01-23 MED ORDER — ENOXAPARIN SODIUM 30 MG/0.3ML IJ SOSY
30.0000 mg | PREFILLED_SYRINGE | INTRAMUSCULAR | Status: DC
  Administered 2023-01-23: 30 mg via SUBCUTANEOUS
  Filled 2023-01-23: qty 0.3

## 2023-01-23 MED ORDER — HYDRALAZINE HCL 20 MG/ML IJ SOLN: 10.0000 mg | Freq: Four times a day (QID) | INTRAMUSCULAR | Status: DC | PRN

## 2023-01-23 NOTE — Progress Notes (Signed)
Physical Therapy Treatment Patient Details Name: Samantha Carpenter MRN: [id number] DOB: [birth year] Today's Date: 01/23/2023   History of Present Illness Patient is a 82 year old female coming [facility] [place] with fall, altered mental status. Found to have acute hypoxic respiratory failure with COPD exacerbation. History of dementia, frequent falls, COPD, chronic diastolic heart failure, CKD stage III A, hypertension, hyperlipidemia.    PT Comments  Pt is received in bed with B hand mittens, she is lethargic in appearance but agreeable to PT session. Pt continues to appear confused and only oriented to self with minimal words spoken throughout session. Pt performs bed mobility maxA x2, transfers, minA x2, and amb modA x2 for safety. Pt continues to requires maxA x2 for bed mobility due to poor initiation of task and trunk control; state of awakness improves with position change. Pt able to perform x2 STS with 2 HHA from PT/SPT due to poor sequencing with use of RW. Pt able to amb approx 4 ft (fw/bw) modA x2 from PT/SPT and requires assist with facilitation to advance LEs (R>L) secondary to poor muscular recruitment/generalized weakness. Additionally, Pt demonstrates poor sequencing and requires multimodal cuing throughout for completion of OOB mobility. Overall, Pt demonstrates min improvements towards PT goals and would benefit from cont skilled PT to address above deficits and promote optimal return to PLOF.    If plan is discharge home, recommend the following: A lot of help with walking and/or transfers;A lot of help with bathing/dressing/bathroom;Assist for transportation;Help with stairs or ramp for entrance;Assistance with cooking/housework;Direct supervision/assist for medications management;Direct supervision/assist for financial management;Supervision due to cognitive status   Can travel by private vehicle     No  Equipment Recommendations  Other (comment) (TBD at next facility)    Recommendations  for Other Services       Precautions / Restrictions Precautions Precautions: Fall Precaution Comments: very confused Restrictions Weight Bearing Restrictions: No     Mobility  Bed Mobility Overal bed mobility: Needs Assistance Bed Mobility: Supine to Sit, Sit to Supine     Supine to sit: Max assist, HOB elevated, +2 for physical assistance Sit to supine: Mod assist, +2 for physical assistance, HOB elevated   General bed mobility comments: Pt able to perform bed mobility modA-maxA x2 with frequent multimodal cuing and assist with trunk/BLE control; poor initiation of task and maintaining effort throughout mobility    Transfers Overall transfer level: Needs assistance Equipment used: 2 person hand held assist Transfers: Sit to/from Stand Sit to Stand: Min assist, +2 physical assistance           General transfer comment: Pt able to perform x2 STS from bed with 2 HHA minA x2 for safety; Pt unable to follow sequencing and requires PT assist with initiation of task    Ambulation/Gait Ambulation/Gait assistance: Mod assist, +2 physical assistance Gait Distance (Feet): 4 Feet Assistive device: 2 person hand held assist Gait Pattern/deviations: Shuffle, Decreased step length - right, Decreased stance time - left, Decreased stride length, Step-to pattern, Step-through pattern, Narrow base of support       General Gait Details: Pt able to amb approx 4 ft using 2 HHA from PT/SPT and requirings facilitation for weight shifting to promote advancement of LEs (R>L); Pt requires assist with facilitation of forward step on RLE due to poor initiation/muscular recruitment and difficulty with sequencing; slight improving with muscular recruitment during backward amb but still requires mod A x2 for completion of task safely   Stairs  Wheelchair Mobility     Tilt Bed    Modified Rankin (Stroke Patients Only)       Balance Overall balance assessment: Needs  assistance Sitting-balance support: Feet supported Sitting balance-Leahy Scale: Fair Sitting balance - Comments: Pt able to maintain seated EOB with intermittint BUE support Postural control: Posterior lean Standing balance support: Bilateral upper extremity supported, During functional activity, Reliant on assistive device for balance Standing balance-Leahy Scale: Poor Standing balance comment: Pt able to maintain standing balance with BUE support from PT/SPT; heavy reliance to maintain upright posture and stability                            Cognition Arousal: Lethargic Behavior During Therapy: Flat affect Overall Cognitive Status: No family/caregiver present to determine baseline cognitive functioning Area of Impairment: Orientation, Attention, Memory, Following commands, Safety/judgement, Awareness, Problem solving                 Orientation Level: Disoriented to, Place, Time, Situation Current Attention Level: Focused Memory: Decreased short-term memory Following Commands: Follows one step commands with increased time Safety/Judgement: Decreased awareness of safety, Decreased awareness of deficits Awareness: Intellectual Problem Solving: Slow processing, Difficulty sequencing, Requires verbal cues, Requires tactile cues, Decreased initiation General Comments: Pleasant and agreeable to PT session; difficulty maintaining level of alertness during supine but able to become more awake in seated EOB position; slow processing, minimal verbal interactions        Exercises Other Exercises Other Exercises: x4 lateral steps modA x2 from PT/SPT with required LE facilitation due to poor initiation of task    General Comments General comments (skin integrity, edema, etc.): B mittens pre/post session for safety      Pertinent Vitals/Pain Pain Assessment Faces Pain Scale: Hurts a little bit Pain Location: unable to verbalize where Pain Intervention(s): Monitored during  session, Limited activity within patient's tolerance    Home Living                          Prior Function            PT Goals (current goals can now be found in the care plan section) Acute Rehab PT Goals Patient Stated Goal: unable to state PT Goal Formulation: With patient Time For Goal Achievement: 02/06/23 Potential to Achieve Goals: Poor Progress towards PT goals: Progressing toward goals    Frequency           PT Plan      Co-evaluation              AM-PAC PT "6 Clicks" Mobility   Outcome Measure  Help needed turning from your back to your side while in a flat bed without using bedrails?: A Lot Help needed moving from lying on your back to sitting on the side of a flat bed without using bedrails?: A Lot Help needed moving to and from a bed to a chair (including a wheelchair)?: A Lot Help needed standing up from a chair using your arms (e.g., wheelchair or bedside chair)?: A Lot Help needed to walk in hospital room?: A Lot Help needed climbing 3-5 steps with a railing? : Total 6 Click Score: 11    End of Session Equipment Utilized During Treatment: Gait belt;Oxygen Activity Tolerance: Patient tolerated treatment well;Patient limited by fatigue Patient left: in bed;with bed alarm set;with restraints reapplied Nurse Communication: Mobility status PT Visit Diagnosis: Muscle weakness (generalized) (M62.81);Unsteadiness  on feet (R26.81)     Time: 1340-1402 PT Time Calculation (min) (ACUTE ONLY): 22 min  Charges:                            Elmon Else, SPT    Antonios Ostrow 01/23/2023, 3:44 PM

## 2023-01-23 NOTE — TOC Progression Note (Signed)
Transition of Care [facility]) - Progression Note    Patient Details  Name: Samantha Carpenter MRN: [id number] Date of Birth: June 21, 1940  Transition of Care [facility]) CM/SW Contact  Truddie Hidden, RN Phone Number: 01/23/2023, 12:12 PM  Clinical Narrative:    Sherron Monday with Amalia Hailey at [facility] [place] regarding patient's return. He stated he had spoken with patient's sister [name] as well. She has been advised by Amalia Hailey, patient can return to the facility when she is able to pivot and turn.   Spoke with Tammy at [facility]. [facility]kley Harvey is still pending.     Expected Discharge Plan: Skilled Nursing Facility Barriers to Discharge: Continued Medical Work up  Expected Discharge Plan and Services       Living arrangements for the past 2 months: Assisted Living Facility                                       Social Determinants of Health (SDOH) Interventions SDOH Screenings   Food Insecurity: Patient Unable To Answer (01/11/2023)  Housing: Patient Unable To Answer (01/11/2023)  Transportation Needs: No Transportation Needs (11/07/2022)  Utilities: Not At Risk (11/07/2022)  Financial Resource Strain: Low Risk  (09/02/2018)  Physical Activity: Sufficiently Active (09/02/2018)  Social Connections: Unknown (09/02/2018)  Stress: No Stress Concern Present (09/02/2018)  Tobacco Use: Medium Risk (01/08/2023)    Readmission Risk Interventions    01/11/2022    2:05 PM 01/09/2022    2:23 PM  Readmission Risk Prevention Plan  Transportation Screening  Complete  HRI or Home Care Consult Complete   Palliative Care Screening  Not Applicable  Medication Review (RN Care Manager)  Complete

## 2023-01-23 NOTE — Progress Notes (Signed)
[facility] Progress Note  Patient: Samantha Carpenter    GHW:[id number]  DOA: 01/08/2023     Date of Service: the patient was seen and examined on 01/23/2023  Chief Complaint  Patient presents with   Fall   Brief hospital course: Kayal Mula is a 82 y.o. female with medical history significant of dementia complicated by frequent falls, COPD, chronic diastolic heart failure, CKD stage III A, hypertension, hyperlipidemia who presents to the ED due to a altered mental status.   History is limited by patient's underlying dementia.  She states that she came in today due to a fall but cannot recall the events surrounding the fall or how it happened.  She also endorses shortness of breath with cough and states has been happening for a while now.  When asking any other review of system questions, patient replies yes to each 1 including chest pain, nausea, vomiting, diarrhea, abdominal pain etc.   ED course: On arrival to the ED, patient was hypertensive at 152/102 with increased to as high as 212/69.  She is saturating at 77% on room air and placed on 3 L with minimal improvement, subsequently requiring a nonrebreather.  She has now been weaned to 2 L with stable oxygen saturation at 99%.  Afebrile at 97.7.  Initial workup notable for WBC of 10.6, BUN of 25, creatinine 1.19 with GFR 45.  Lactic acid 1.3.  COVID-19, influenza and RSV PCR negative.  UA with no infectious signs.   Chest x-ray was obtained with concern for patchy left retrocardiac opacity that may be infectious in etiology in addition to possible small left pleural effusion versus atelectasis and potential distal left clavicle fracture.  CT head and CT of the C-spine were obtained and still pending.  Patient started on vancomycin, Rocephin and DuoNebs.  TRH contacted for admission.   As per Dr. Mayford Knife 10/30-11/5/24: Pt has dementia. Medically stable this week. Waiting on SNF placement and insurance auth. See CM notes.   Assessment and  Plan: Acute hypoxic and hypercapnic respiratory failure: likely secondary to CAP, COPD & CHF. Continue on supplemental oxygen and wean as tolerated. Was on BiPAP but has since been weaned off   COPD exacerbation: completed abx & steroid course. Bronchodilators prn  Patient was on Advair inhaler, switch to [employer] inhaler 11/6 mild wheezing, started DuoNeb every 6 hourly scheduled, transition to as needed after improvement.  CAP: completed abx course. Bronchodilators prn    Acute on chronic diastolic CHF: holding lasix. Monitor I/Os   Dementia with behavioral disturbance: continue w/ supportive care. Haldol prn. Tapering home klonopin   Dysphagia: likely secondary to dementia. Continue on dysphagia I diet as per speech    CKDIIIa: Cr is labile. Avoid nephrotoxic meds    HTN: continue on aldactone, bisoprolol, aldactone. IV hydralazine prn   Prediabetes: HbA1c 5.8. Unable to comprehend preDM education    Falls frequently: likely secondary to dementia. PT/OT recs SNF    Hypokalemia:WNL today    Hypernatremia: resolved   Metabolic alkalosis: etiology unclear. Stable from day prior      Body mass index is 25.75 kg/m.  Interventions:  Diet: Dysphagia 1 diet DVT Prophylaxis: Subcutaneous Lovenox   Advance goals of care discussion: DNR/limited  Family Communication: family was not present at bedside, at the time of interview.  The pt provided permission to discuss medical plan with the family. Opportunity was given to ask question and all questions were answered satisfactorily.   Disposition:  Pt is from [place]  [facility] [place] facility, admitted with resp failure, clinically stable, medically optimized and stable to discharge back to SNF. Discharge to SNF, when insurance Auth will be approved. Follow TOC for disposition plan  Subjective: No significant events overnight, patient was having mild shortness of breath, AAO x 0, unable to offer any complaints.  Physical  Exam: General: NAD, lying comfortably Appear in no distress, affect appropriate Eyes: PERRLA ENT: Oral Mucosa Clear, moist  Neck: no JVD,  Cardiovascular: S1 and S2 Present, no Murmur,  Respiratory: Equal air entry bilaterally, no crackles, mild wheezing Abdomen: Bowel Sound present, Soft and no tenderness,  Skin: no rashes Extremities: no Pedal edema, no calf tenderness Neurologic: without any new focal findings, AAO x zero Gait not checked due to patient safety concerns  Vitals:   01/23/23 0335 01/23/23 0824 01/23/23 1242 01/23/23 1653  BP: (!) 148/80 (!) 178/70 (!) 172/60 (!) 143/76  Pulse: 83 80 72 88  Resp: 20 20 16    Temp: 98.9 F (37.2 C) 98.3 F (36.8 C) 98.7 F (37.1 C) (!) 100.9 F (38.3 C)  TempSrc: Oral Oral Oral   SpO2: 100% 97% 100% 97%  Weight:      Height:        Intake/Output Summary (Last 24 hours) at 01/23/2023 1704 Last data filed at 01/23/2023 1633 Gross per 24 hour  Intake 440 ml  Output 200 ml  Net 240 ml   Filed Weights   01/08/23 1223  Weight: 68 kg    Data Reviewed: I have personally reviewed and interpreted daily labs, tele strips, imagings as discussed above. I reviewed all nursing notes, pharmacy notes, vitals, pertinent old records I have discussed plan of care as described above with RN and patient/family.  CBC: Recent Labs  Lab 01/18/23 0712  WBC 8.4  HGB 10.3*  HCT 32.8*  MCV 100.9*  PLT 245   Basic Metabolic Panel: Recent Labs  Lab 01/18/23 0712 01/19/23 0605 01/20/23 0447 01/21/23 0443 01/22/23 0635 01/23/23 0512  NA 145 146* 146* 147* 144 143  K 3.7 4.2 4.3 4.2 4.5 3.9  CL 98 99 99 99 96* 98  CO2 38* 38* 38* 35* 36* 34*  GLUCOSE 158* 136* 132* 136* 138* 157*  BUN 43* 44* 41* 39* 37* 37*  CREATININE 1.41* 1.31* 1.11* 1.30* 1.19* 1.41*  CALCIUM 8.6* 8.7* 9.0 9.1 9.2 9.0  MG 2.3  --   --   --   --  2.3  PHOS  --   --   --   --   --  3.4    Studies: No results found.  Scheduled Meds:  aspirin EC  81 mg Oral  Daily   bisoprolol  5 mg Oral Daily   clonazepam  0.125 mg Oral TID   DULoxetine  60 mg Oral Daily   enoxaparin (LOVENOX) injection  30 mg Subcutaneous Q24H   famotidine  20 mg Oral Daily   free water  200 mL Oral Q6H   influenza vaccine adjuvanted  0.5 mL Intramuscular Tomorrow-1000   losartan  100 mg Oral Daily   OLANZapine  2.5 mg Oral BID   mouth rinse  15 mL Mouth Rinse 4 times per day   OXcarbazepine  150 mg Oral q AM   OXcarbazepine  300 mg Oral QHS   rivastigmine  1.5 mg Oral BID   spironolactone  12.5 mg Oral Daily   Continuous Infusions: PRN Meds: acetaminophen **OR** acetaminophen, haloperidol lactate, hydrALAZINE, hydrALAZINE, ipratropium-albuterol, ondansetron **OR** ondansetron (ZOFRAN) IV,  mouth rinse, polyethylene glycol  Time spent: 35 minutes  Author: Gillis Santa. MD [facility] 01/23/2023 5:04 PM  To reach On-call, see care teams to locate the attending and reach out to them via [email or web address]. If 7PM-7AM, please contact night-coverage If you still have difficulty reaching the attending provider, please page the [facility] (Director on Call) for [facility] on amion for assistance.

## 2023-01-23 NOTE — Progress Notes (Signed)
                                                     Palliative Care Progress Note, Assessment & Plan   Patient Name: Samantha Carpenter       Date: 01/23/2023 DOB: 1940/11/12  Age: 82 y.o. MRN#: [id number] Attending Physician: Gillis Santa, MD Primary Care Physician: Pcp, No Admit Date: 01/08/2023  Subjective: Patient is sitting up in bed in no apparent distress.  She has an activity/play mat on her lap.  She acknowledges my presence.  There are no family or friends present during my visit.  HPI: 82 y.o. female  with past medical history of dementia with frequent falls, COPD, dCHF, CKD stage 3a, HTN, HLD admitted from [facility] on 01/08/2023 with AMS with associated SHOB and cough.   Admitted and being treated for COPD exacerbation with underlying pneumonia as well as CHF exacerbation.  Has required intermittent BiPAP support   Palliative medicine was consulted for assisting with goals of care conversations.  Summary of counseling/coordination of care: Extensive chart review completed prior to meeting patient including labs, vital signs, imaging, progress notes, orders, and available advanced directive documents from current and previous encounters.   Chart reviewed.  Patient assessed.  As with previous interactions, patient remains unable to participate in goals of care discussions or medical decision making independently at this time. She denied pain or discomfort. No adjustment to [facility] needed at this time. I counseled with dayshift RN who shares patient is stable with no acute changes. No palliative needs at this time.   After assessing the patient, I attempted to speak with her sister/legal guardian.  No answer.  Mailbox was full and I was unable to leave a voicemail.  This is the second attempt to speak with patient's legal guardian unsuccessfully.  PMT  will continue for a third time at a later date.  PMT will continue to follow and support patient throughout her hospitalization.  As per chart review, plan is for patient to d/c to SNF.   As such, legal guardian/sister [name] aware of MOST form and usefulness when transferring to another medical facility. MOST not yet completed.   Physical Exam Constitutional:      General: She is not in acute distress.    Appearance: She is normal weight.  HENT:     Head: Normocephalic.     Mouth/Throat:     Mouth: Mucous membranes are moist.  Eyes:     Pupils: Pupils are equal, round, and reactive to light.  Pulmonary:     Effort: Pulmonary effort is normal.  Abdominal:     Palpations: Abdomen is soft.  Musculoskeletal:     Comments: Generalized weakness  Skin:    General: Skin is warm and dry.  Neurological:     Mental Status: She is alert.  Psychiatric:        Behavior: Behavior normal.             Total Time 25 minutes   Time spent includes: Detailed review of medical records (labs, imaging, vital signs), medically appropriate exam (mental status, respiratory, cardiac, skin), discussed with treatment team, counseling and educating patient, family and staff, documenting clinical information, medication management and coordination of care.  Samara Deist L. Bonita Quin, DNP, FNP-BC Palliative Medicine Team

## 2023-01-24 DIAGNOSIS — J9601 Acute respiratory failure with hypoxia: Secondary | ICD-10-CM | POA: Diagnosis not present

## 2023-01-24 LAB — CBC
HCT: 33.7 % — ABNORMAL LOW (ref 36.0–46.0)
Hemoglobin: 10.7 g/dL — ABNORMAL LOW (ref 12.0–15.0)
MCH: 30.6 pg (ref 26.0–34.0)
MCHC: 31.8 g/dL (ref 30.0–36.0)
MCV: 96.3 fL (ref 80.0–100.0)
Platelets: 363 10*3/uL (ref 150–400)
RBC: 3.5 MIL/uL — ABNORMAL LOW (ref 3.87–5.11)
RDW: 13.6 % (ref 11.5–15.5)
WBC: 11.2 10*3/uL — ABNORMAL HIGH (ref 4.0–10.5)
nRBC: 0 % (ref 0.0–0.2)

## 2023-01-24 LAB — BASIC METABOLIC PANEL
Anion gap: 12 (ref 5–15)
BUN: 32 mg/dL — ABNORMAL HIGH (ref 8–23)
CO2: 33 mmol/L — ABNORMAL HIGH (ref 22–32)
Calcium: 8.9 mg/dL (ref 8.9–10.3)
Chloride: 99 mmol/L (ref 98–111)
Creatinine, Ser: 1.26 mg/dL — ABNORMAL HIGH (ref 0.44–1.00)
GFR, Estimated: 42 mL/min — ABNORMAL LOW (ref >60)
Glucose, Bld: 174 mg/dL — ABNORMAL HIGH (ref 70–99)
Potassium: 3.9 mmol/L (ref 3.5–5.1)
Sodium: 144 mmol/L (ref 135–145)

## 2023-01-24 MED ORDER — FUROSEMIDE 40 MG PO TABS: 20.0000 mg | ORAL_TABLET | Freq: Every day | ORAL | Status: DC | PRN

## 2023-01-24 MED ORDER — CLONAZEPAM 0.5 MG PO TABS: 0.5000 mg | ORAL_TABLET | Freq: Two times a day (BID) | ORAL | 0 refills | Status: DC

## 2023-01-24 MED ORDER — IPRATROPIUM-ALBUTEROL 0.5-2.5 (3) MG/3ML IN SOLN
3.0000 mL | Freq: Two times a day (BID) | RESPIRATORY_TRACT | Status: DC
Start: 2023-01-24 — End: 2023-01-24

## 2023-01-24 MED ORDER — ORAL CARE MOUTH RINSE: 15.0000 mL | Freq: Three times a day (TID) | OROMUCOSAL | Status: DC

## 2023-01-24 MED ORDER — HYDRALAZINE HCL 50 MG PO TABS: 50.0000 mg | ORAL_TABLET | Freq: Four times a day (QID) | ORAL | Status: DC | PRN

## 2023-01-24 NOTE — Care Management Important Message (Signed)
Important Message  Patient Details  Name: Samantha Carpenter MRN: [id number] Date of Birth: 05/06/1940   Important Message Given:  Other (see comment)  Tried reaching the patients legal guardian, Robert Bellow (sister) to review the Important Message from Medicare with her again but voicemail was full so unable to leave her a message. Also, tried the patient's room but no answer.    Olegario Messier A Lori-Ann Lindfors 01/24/2023, 12:09 PM

## 2023-01-24 NOTE — Discharge Summary (Signed)
[facility] Discharge Summary   Patient: Donnarae Rae UJW:[id number]  PCP: Pcp, No  Date of admission: 01/08/2023   Date of discharge:  01/24/2023     Discharge Diagnoses:  Principal Problem:   Acute hypoxic respiratory failure (HCC) Active Problems:   COPD with acute exacerbation (HCC)   CAP (community acquired pneumonia)   CKD stage 3a, GFR 45-59 ml/min (HCC)   Dementia with behavioral disturbance (HCC)   Chronic diastolic CHF (congestive heart failure) (HCC)   HTN, goal below 140/80   Prediabetes   Falls frequently   Admitted From: [facility] facility  Disposition:  [facility]  Recommendations for Outpatient Follow-up:  Follow-up with PCP, patient should be seen by an MD in 1 to 2 days, monitor BP and titrate medications accordingly. Follow up LABS/TEST:     Contact information for after-discharge care     Destination     [facility] Preferred SNF .   Service: Skilled Nursing Contact information: [address] [place] [address] [phone]                    Diet recommendation: Dysphagia type 1 Thin Liquid  Activity: The patient is advised to gradually reintroduce usual activities, as tolerated  Discharge Condition: stable  Code Status: DNR -Limited  History of present illness: As per the H and P dictated on admission Hospital Course:  Samantha Carpenter is a 82 y.o. female with medical history significant of dementia complicated by frequent falls, COPD, chronic diastolic heart failure, CKD stage III A, hypertension, hyperlipidemia who presents to the ED due to a altered mental status.   History is limited by patient's underlying dementia.  She states that she came in today due to a fall but cannot recall the events surrounding the fall or how it happened.  She also endorses shortness of breath with cough and states has been happening for a while now.  When asking any other review of system  questions, patient replies yes to each 1 including chest pain, nausea, vomiting, diarrhea, abdominal pain etc.   ED course: On arrival to the ED, patient was hypertensive at 152/102 with increased to as high as 212/69.  She is saturating at 77% on room air and placed on 3 L with minimal improvement, subsequently requiring a nonrebreather.  She has now been weaned to 2 L with stable oxygen saturation at 99%.  Afebrile at 97.7.  Initial workup notable for WBC of 10.6, BUN of 25, creatinine 1.19 with GFR 45.  Lactic acid 1.3.  COVID-19, influenza and RSV PCR negative.  UA with no infectious signs.   Chest x-ray was obtained with concern for patchy left retrocardiac opacity that may be infectious in etiology in addition to possible small left pleural effusion versus atelectasis and potential distal left clavicle fracture.  CT head and CT of the C-spine were obtained and still pending.  Patient started on vancomycin, Rocephin and DuoNebs.  TR[facility] contacted for admission.   As per Dr. Mayford Knife 10/30-11/5/24: Pt has dementia. Medically stable this week. Waiting on SNF placement and insurance auth. See CM notes.   11/7 as per [facility] insurance Auth received, patient is clinically stable, medically optimized to discharge to rehab.  Assessment and Plan:  # Acute hypoxic and hypercapnic respiratory failure: likely secondary to CAP, COPD & CHF. Continue on supplemental oxygen and wean as tolerated. Was on BiPAP but has since been weaned off # COPD exacerbation: completed abx &  steroid course. Bronchodilators prn  Patient was on Advair inhaler, switch to [employer] inhaler.  Resumed home inhaler on discharge 11/6 mild wheezing, started DuoNeb every 6 hourly scheduled, transition to as needed after improvement.  # CAP: completed abx course.  Continue to wear inhaler daily and albuterol as needed.   # Acute on chronic diastolic CHF: Changed Lasix to as needed, discontinued spironolactone.  Monitor hydration status and  then resume if needed.  Currently patient is euvolemic. # Dementia with behavioral disturbance: continue w/ supportive care. Resumed Home meds, decrease Klonopin from 0.5 mg 3 times daily to twice daily dosing due to excessive sedation.  Watch for any seizure disorder.  # Dysphagia: likely secondary to dementia. Continue on dysphagia I diet as per speech  # CKDIIIa: Cr is labile. Avoid nephrotoxic meds  # HTN: continue bisoprolol, and losartan.  Started hydralazine 50 mg p.o. as needed if SBP greater than 150 mmHg.  Monitor BP and titrate medication accordingly.  Discontinued Lasix and Aldactone. # Prediabetes: HbA1c 5.8. Unable to comprehend preDM education  # Falls frequently: likely secondary to dementia. PT/OT recs SNF  # Hypokalemia: Resolved # Hypernatremia: resolved, discontinued salt tablets # Metabolic alkalosis: etiology unclear.  Bicarb 33, stable, it could be due to chronic CO2 retention  Body mass index is 25.75 kg/m.  Nutrition Interventions:  - [place] Controlled Substance Reporting System database could not be reviewed as system was not working.   Klonopin 0.5 mg p.o. twice daily prescribed, decreased dose due to sedation.  Patient was taking Klonopin 0.5 mg p.o. 3 times daily at home. - 30 day supply was provided. - Patient was instructed, not to drive, operate heavy machinery, perform activities at heights, swimming or participation in water activities or provide baby sitting services while on Pain, Sleep and Anxiety Medications; until her outpatient Physician has advised to do so again.  - Also recommended to not to take more than prescribed Pain, Sleep and Anxiety Medications.  Patient was seen by physical therapy, who recommended Therapy, SNF placement, which was arranged. On the day of the discharge the patient's vitals were stable, and no other acute medical condition were reported by patient. the patient was felt safe to be discharge at [facility].  Consultants:  Palliative care Procedures: None  Discharge Exam: General: Appear in no distress, no Rash; Oral Mucosa Clear, moist. Cardiovascular: S1 and S2 Present, no Murmur, Respiratory: normal respiratory effort, Bilateral Air entry present and no Crackles, no wheezes Abdomen: Bowel Sound present, Soft and no tenderness, no hernia Extremities: no Pedal edema, no calf tenderness Neurology: No new neurological changes, patient is unable to offer any complaints. affect demented, pleasantly confused. Filed Weights   01/08/23 1223  Weight: 68 kg   Vitals:   01/24/23 0355 01/24/23 0736  BP: (!) 154/81 (!) 189/58  Pulse: 86 81  Resp: 20   Temp: 98.6 F (37 C) 98.4 F (36.9 C)  SpO2: 98% 98%    DISCHARGE MEDICATION: Allergies as of 01/24/2023       Reactions   Morphine And Codeine    Latex Rash   "I break out"        Medication List     STOP taking these medications    MAGNESIUM OXIDE 400 PO   QUEtiapine 50 MG tablet Commonly known as: SEROQUEL   sodium chloride 1 g tablet   spironolactone 25 MG tablet Commonly known as: ALDACTONE       TAKE these medications  Acetaminophen Extra Strength 500 MG Tabs Take 500 mg by mouth every 4 (four) hours as needed (pain).   albuterol 108 (90 Base) MCG/ACT inhaler Commonly known as: VENTOLIN HFA Inhale 2 puffs into the lungs every 6 (six) hours as needed for wheezing or shortness of breath.   aspirin EC 81 MG tablet Take 81 mg by mouth daily.   bisoprolol 5 MG tablet Commonly known as: ZEBETA Take 1 tablet (5 mg total) by mouth daily.   clonazePAM 0.5 MG tablet Commonly known as: KLONOPIN Take 1 tablet (0.5 mg total) by mouth 2 (two) times daily. What changed: when to take this   DULoxetine 60 MG capsule Commonly known as: CYMBALTA Take 60 mg by mouth daily.   famotidine 20 MG tablet Commonly known as: Pepcid Take 1 tablet (20 mg total) by mouth daily.   fluticasone-salmeterol 250-50 MCG/ACT Aepb Commonly known  as: ADVAIR Inhale 1 puff into the lungs in the morning and at bedtime.   furosemide 40 MG tablet Commonly known as: LASIX Take 0.5 tablets (20 mg total) by mouth daily as needed for edema or fluid. What changed:  when to take this reasons to take this   gabapentin 100 MG capsule Commonly known as: NEURONTIN Take 100 mg by mouth 2 (two) times daily.   haloperidol 2 MG tablet Commonly known as: HALDOL Take 1 tablet (2 mg total) by mouth every 6 (six) hours as needed for agitation.   hydrALAZINE 50 MG tablet Commonly known as: APRESOLINE Take 1 tablet (50 mg total) by mouth every 6 (six) hours as needed (SBP >150).   ipratropium-albuterol 0.5-2.5 (3) MG/3ML Soln Commonly known as: DUONEB Take 3 mLs by nebulization in the morning, at noon, and at bedtime.   losartan 100 MG tablet Commonly known as: COZAAR Take 0.5 tablets (50 mg total) by mouth daily. What changed: how much to take   mouth rinse Liqd solution 15 mLs by Mouth Rinse route 3 (three) times daily.   OLANZapine 5 MG tablet Commonly known as: ZYPREXA Take 2.5 mg by mouth 2 (two) times daily. At 1200 and 1800.   OXcarbazepine 150 MG tablet Commonly known as: TRILEPTAL Take 300 mg by mouth at bedtime.   OXcarbazepine 150 MG tablet Commonly known as: TRILEPTAL Take 150 mg by mouth in the morning.   polyethylene glycol 17 g packet Commonly known as: MIRALAX / GLYCOLAX Take 17 g by mouth daily.   rivastigmine 1.5 MG capsule Commonly known as: EXELON Take 1.5 mg by mouth 2 (two) times daily.   Senna Plus 8.6-50 MG tablet Generic drug: senna-docusate Take 1 tablet by mouth at bedtime.   Spiriva Respimat 2.5 MCG/ACT Aers Generic drug: Tiotropium Bromide Monohydrate Inhale 2 puffs into the lungs daily.       Allergies  Allergen Reactions   Morphine And Codeine    Latex Rash    "I break out"   Discharge Instructions     Call MD for:  difficulty breathing, headache or visual disturbances   Complete  by: As directed    Call MD for:  extreme fatigue   Complete by: As directed    Call MD for:  persistant dizziness or light-headedness   Complete by: As directed    Call MD for:  persistant nausea and vomiting   Complete by: As directed    Call MD for:  severe uncontrolled pain   Complete by: As directed    Call MD for:  temperature >100.4   Complete by:  As directed    Diet - low sodium heart healthy   Complete by: As directed    Discharge instructions   Complete by: As directed    Follow-up with PCP, patient should be seen by an MD in 1 to 2 days, monitor BP and titrate medications accordingly.   Increase activity slowly   Complete by: As directed    No wound care   Complete by: As directed        The results of significant diagnostics from this hospitalization (including imaging, microbiology, ancillary and laboratory) are listed below for reference.    Significant Diagnostic Studies: CT Angio Chest Pulmonary Embolism (PE) W or WO Contrast  Result Date: 01/12/2023 CLINICAL DATA:  Respiratory failure EXAM: CT ANGIOGRAPHY CHEST WITH CONTRAST TECHNIQUE: Multidetector CT imaging of the chest was performed using the standard protocol during bolus administration of intravenous contrast. Multiplanar CT image reconstructions and MIPs were obtained to evaluate the vascular anatomy. RADIATION DOSE REDUCTION: This exam was performed according to the departmental dose-optimization program which includes automated exposure control, adjustment of the mA and/or kV according to patient size and/or use of iterative reconstruction technique. CONTRAST:  75mL OMNIPAQUE IOHEXOL 350 MG/ML SOLN COMPARISON:  11/06/2022 FINDINGS: Cardiovascular: Satisfactory opacification of the pulmonary arteries to the segmental level. No evidence of pulmonary embolism. Normal heart size. Three-vessel coronary artery calcifications. No pericardial effusion. Aortic atherosclerosis. Mediastinum/Nodes: Unchanged enlarged  mediastinal and hilar lymph nodes, pretracheal nodes measuring up to 1.8 x 1.5 cm (series 4, image 61). Thyroid gland, trachea, and esophagus demonstrate no significant findings. Lungs/Pleura: Moderate centrilobular and paraseptal emphysema. Diffuse bilateral bronchial wall thickening. Small bilateral pleural effusions and associated atelectasis or consolidation, increased compared to prior examination. Scattered, consolidative airspace opacities throughout the lungs, increased compared to prior examination (series 6, image 89). Upper Abdomen: No acute abnormality. Musculoskeletal: No chest wall abnormality. No acute osseous findings. Review of the MIP images confirms the above findings. IMPRESSION: 1. Negative examination for pulmonary embolism. 2. Small bilateral pleural effusions and associated atelectasis or consolidation, increased compared to prior examination. 3. Scattered, consolidative airspace opacities throughout the lungs, increased compared to prior examination, consistent with worsened multifocal infection. 4. Unchanged enlarged mediastinal and hilar lymph nodes, likely reactive. 5. Emphysema and diffuse bilateral bronchial wall thickening. 6. Coronary artery disease. Aortic Atherosclerosis (ICD10-I70.0) and Emphysema (ICD10-J43.9). Electronically Signed   By: Jearld Lesch M.D.   On: 01/12/2023 18:49   DG Chest Port 1 View  Result Date: 01/10/2023 CLINICAL DATA:  Acute respiratory distress EXAM: PORTABLE CHEST 1 VIEW COMPARISON:  01/08/2023 FINDINGS: Cardiac shadow is stable. Aortic calcifications are noted. Increasing airspace opacities are noted in the bases bilaterally left greater than right when compare with the prior exam. Mild central vascular congestion is noted. No bony abnormality is noted. IMPRESSION: Mild central vascular congestion. Increasing bibasilar airspace opacity left greater than right. Electronically Signed   By: Alcide Clever M.D.   On: 01/10/2023 18:43   CT Head Wo  Contrast  Result Date: 01/08/2023 CLINICAL DATA:  fall EXAM: CT HEAD WITHOUT CONTRAST CT CERVICAL SPINE WITHOUT CONTRAST TECHNIQUE: Multidetector CT imaging of the head and cervical spine was performed following the standard protocol without intravenous contrast. Multiplanar CT image reconstructions of the cervical spine were also generated. RADIATION DOSE REDUCTION: This exam was performed according to the departmental dose-optimization program which includes automated exposure control, adjustment of the mA and/or kV according to patient size and/or use of iterative reconstruction technique. COMPARISON:  CT head and  C Spine 10/31/22 FINDINGS: CT HEAD FINDINGS Brain: No hemorrhage. No hydrocephalus. No extra-axial fluid collection. No CT evidence of an acute cortical infarct. No mass effect. No mass lesion. Vascular: No hyperdense vessel or unexpected calcification. Skull: Normal. Negative for fracture or focal lesion. Sinuses/Orbits: Effusion. Trace right mastoid effusion. Bilateral lens replacement. Orbits are otherwise unremarkable. Paranasal sinuses clear. Other: None. CT CERVICAL SPINE FINDINGS Limitations: Motion degraded exam Alignment: Grade 1 anterolisthesis of C3 on C4. Mild retrolisthesis of C4 on C5 and C5 on C6. Skull base and vertebrae: No acute fracture. No primary bone lesion or focal pathologic process. Soft tissues and spinal canal: No prevertebral fluid or swelling. No visible canal hematoma. Disc levels:  Mild spinal canal narrowing at C4-C5 Upper chest: Biapical pleural-parenchymal scarring. Other: None IMPRESSION: 1. No acute intracranial abnormality. 2. No acute fracture or traumatic malalignment of the cervical spine. 3. Mild spinal canal narrowing at C4-C5. Electronically Signed   By: Lorenza Cambridge M.D.   On: 01/08/2023 17:26   CT Cervical Spine Wo Contrast  Result Date: 01/08/2023 CLINICAL DATA:  fall EXAM: CT HEAD WITHOUT CONTRAST CT CERVICAL SPINE WITHOUT CONTRAST TECHNIQUE:  Multidetector CT imaging of the head and cervical spine was performed following the standard protocol without intravenous contrast. Multiplanar CT image reconstructions of the cervical spine were also generated. RADIATION DOSE REDUCTION: This exam was performed according to the departmental dose-optimization program which includes automated exposure control, adjustment of the mA and/or kV according to patient size and/or use of iterative reconstruction technique. COMPARISON:  CT head and C Spine 10/31/22 FINDINGS: CT HEAD FINDINGS Brain: No hemorrhage. No hydrocephalus. No extra-axial fluid collection. No CT evidence of an acute cortical infarct. No mass effect. No mass lesion. Vascular: No hyperdense vessel or unexpected calcification. Skull: Normal. Negative for fracture or focal lesion. Sinuses/Orbits: Effusion. Trace right mastoid effusion. Bilateral lens replacement. Orbits are otherwise unremarkable. Paranasal sinuses clear. Other: None. CT CERVICAL SPINE FINDINGS Limitations: Motion degraded exam Alignment: Grade 1 anterolisthesis of C3 on C4. Mild retrolisthesis of C4 on C5 and C5 on C6. Skull base and vertebrae: No acute fracture. No primary bone lesion or focal pathologic process. Soft tissues and spinal canal: No prevertebral fluid or swelling. No visible canal hematoma. Disc levels:  Mild spinal canal narrowing at C4-C5 Upper chest: Biapical pleural-parenchymal scarring. Other: None IMPRESSION: 1. No acute intracranial abnormality. 2. No acute fracture or traumatic malalignment of the cervical spine. 3. Mild spinal canal narrowing at C4-C5. Electronically Signed   By: Lorenza Cambridge M.D.   On: 01/08/2023 17:26   DG Chest Port 1 View  Result Date: 01/08/2023 CLINICAL DATA:  Concern for sepsis.  Hypoxia. EXAM: PORTABLE CHEST 1 VIEW COMPARISON:  Chest radiograph dated November 06, 2022. FINDINGS: Patient is rotated to the right. The heart size and mediastinal contours are within normal limits. Aortic  calcification. Patchy left retrocardiac opacity. There is blunting of the left costophrenic angle, which could represent a small effusion or atelectasis. No pneumothorax. Cortical irregularity of the distal left clavicle, appears new compared to the prior exam dated November 06, 2022. Remote healed left anterior rib fractures. IMPRESSION: 1. Patchy left retrocardiac opacity may be secondary to an infectious/inflammatory etiology. 2. Possible small left pleural effusion versus atelectasis. 3. Cortical irregularity of the distal left clavicle appears new compared to the prior exam dated October 29, 2022. A fracture can not be excluded. Recommend correlation with history and physical exam. Consider dedicated radiographs of the left clavicle for further evaluation.  Electronically Signed   By: Hart Robinsons M.D.   On: 01/08/2023 15:28    Microbiology: No results found for this or any previous visit (from the past 240 hour(s)).   Labs: CBC: Recent Labs  Lab 01/18/23 0712 01/24/23 0417  WBC 8.4 11.2*  HGB 10.3* 10.7*  HCT 32.8* 33.7*  MCV 100.9* 96.3  PLT 245 363   Basic Metabolic Panel: Recent Labs  Lab 01/18/23 0712 01/19/23 0605 01/20/23 0447 01/21/23 0443 01/22/23 0635 01/23/23 0512 01/24/23 0417  NA 145   < > 146* 147* 144 143 144  K 3.7   < > 4.3 4.2 4.5 3.9 3.9  CL 98   < > 99 99 96* 98 99  CO2 38*   < > 38* 35* 36* 34* 33*  GLUCOSE 158*   < > 132* 136* 138* 157* 174*  BUN 43*   < > 41* 39* 37* 37* 32*  CREATININE 1.41*   < > 1.11* 1.30* 1.19* 1.41* 1.26*  CALCIUM 8.6*   < > 9.0 9.1 9.2 9.0 8.9  MG 2.3  --   --   --   --  2.3  --   PHOS  --   --   --   --   --  3.4  --    < > = values in this interval not displayed.   Liver Function Tests: No results for input(s): "AST", "ALT", "ALKPHOS", "BILITOT", "PROT", "ALBUMIN" in the last 168 hours. No results for input(s): "LIPASE", "AMYLASE" in the last 168 hours. No results for input(s): "AMMONIA" in the last 168 hours. Cardiac  Enzymes: No results for input(s): "CKTOTAL", "CKMB", "CKMBINDEX", "TROPONINI" in the last 168 hours. BNP (last 3 results) Recent Labs    04/16/22 1139 04/28/22 1205 01/08/23 1253  BNP 109.6* 240.1* 992.3*   CBG: No results for input(s): "GLUCAP" in the last 168 hours.  Time spent: 35 minutes  Signed:  Gillis Santa  [facility] 01/24/2023 11:55 AM

## 2023-01-24 NOTE — Progress Notes (Signed)
Called patient's sister and told her she would be going to the [facility] around 3:30 via EMS. She acknowledged

## 2023-01-24 NOTE — Progress Notes (Signed)
Called [facility] and gave report to RN. Patient will be going to room 709. Patient will be going via EMS.

## 2023-01-24 NOTE — Progress Notes (Signed)
Palliative Care Progress Note, Assessment & Plan   Patient Name: Samantha Carpenter       Date: 01/24/2023 DOB: 1940/10/31  Age: 82 y.o. MRN#: [id number] Attending Physician: Gillis Santa, MD Primary Care Physician: Pcp, No Admit Date: 01/08/2023  Subjective: Patient is lying in bed on her left side.  She is awake and acknowledges my presence.  Nurses at bedside administering a.m. meds.  No family or friends present during my visit.  HPI: 82 y.o. female  with past medical history of dementia with frequent falls, COPD, dCHF, CKD stage 3a, HTN, HLD admitted from [facility] on 01/08/2023 with AMS with associated SHOB and cough.   Admitted and being treated for COPD exacerbation with underlying pneumonia as well as CHF exacerbation.  Has required intermittent BiPAP support   Palliative medicine was consulted for assisting with goals of care conversations.  Summary of counseling/coordination of care: Extensive chart review completed prior to meeting patient including labs, vital signs, imaging, progress notes, orders, and available advanced directive documents from current and previous encounters.   After reviewing the patient's chart and assessing the patient at bedside, I discussed symptom management and goals of care with patient.  Symptoms assessed.  Patient denied pain, discomfort, headache, N/V/D, and other acute ailments at this time.  She endorses she is comfortable and has no needs.  No adjustment to [facility] needed at this time.  I attempted to discuss next steps with patient.  I outlined the we are attempting to transfer her back to the [facility].  She had no response.  Patient remains unable to participate in goals of care discussions or medical decision making independently.  After visiting with the patient, I  again attempted to speak with patient's sister/legal guardian over the phone.  No answer.  Her mailbox was full and I was unable to leave a voicemail.  This was my third attempt over the past 3 days to speak with patient's sister.  No response.  I have spoken with [name] in the past.  She has PMT contact info.  She is also aware that a MOST form has been recommended for her to complete to make patient's wishes known as she is transferring to an outside medical facility.  PMT will step back and shadow the patient's chart.  Family aware to call PMT with any acute palliative issues.  Please reengage with PMT if goals change, at patient/family's request, or if patient's health deteriorates during this hospitalization.  Physical Exam Vitals reviewed.  Constitutional:      General: She is not in acute distress.    Appearance: She is normal weight.  HENT:     Head: Normocephalic.     Mouth/Throat:     Mouth: Mucous membranes are moist.  Eyes:     Pupils: Pupils are equal, round, and reactive to light.  Pulmonary:     Effort: Pulmonary effort is normal.  Abdominal:     Palpations: Abdomen is soft.  Musculoskeletal:     Comments: Generalized weakness  Skin:    General: Skin is warm and dry.     Coloration: Skin is pale.  Neurological:     Mental Status: She is alert.  Comments: Oriented to self  Psychiatric:        Mood and Affect: Mood normal.        Judgment: Judgment normal.             Total Time 25 minutes   Time spent includes: Detailed review of medical records (labs, imaging, vital signs), medically appropriate exam (mental status, respiratory, cardiac, skin), discussed with treatment team, counseling and educating patient, family and staff, documenting clinical information, medication management and coordination of care.  Samara Deist L. Bonita Quin, DNP, FNP-BC Palliative Medicine Team

## 2023-01-24 NOTE — TOC Progression Note (Signed)
Transition of Care [facility]) - Progression Note    Patient Details  Name: Samantha Carpenter MRN: [id number] Date of Birth: 06-24-1940  Transition of Care [facility]) CM/SW Contact  Margarito Liner, LCSW Phone Number: 01/24/2023, 10:40 AM  Clinical Narrative: Berkley Harvey approved for [facility]. MD and sister are aware.    Expected Discharge Plan: Skilled Nursing Facility Barriers to Discharge: Continued Medical Work up  Expected Discharge Plan and Services       Living arrangements for the past 2 months: Assisted Living Facility                                       Social Determinants of Health (SDOH) Interventions SDOH Screenings   Food Insecurity: Patient Unable To Answer (01/11/2023)  Housing: Patient Unable To Answer (01/11/2023)  Transportation Needs: No Transportation Needs (11/07/2022)  Utilities: Not At Risk (11/07/2022)  Financial Resource Strain: Low Risk  (09/02/2018)  Physical Activity: Sufficiently Active (09/02/2018)  Social Connections: Unknown (09/02/2018)  Stress: No Stress Concern Present (09/02/2018)  Tobacco Use: Medium Risk (01/08/2023)    Readmission Risk Interventions    01/11/2022    2:05 PM 01/09/2022    2:23 PM  Readmission Risk Prevention Plan  Transportation Screening  Complete  HRI or Home Care Consult Complete   Palliative Care Screening  Not Applicable  Medication Review (RN Care Manager)  Complete

## 2023-01-24 NOTE — TOC Transition Note (Signed)
Transition of Care [facility]) - CM/SW Discharge Note   Patient Details  Name: Samantha Carpenter MRN: [id number] Date of Birth: [birth year]  Transition of Care [facility]) CM/SW Contact:  Margarito Liner, LCSW Phone Number: 01/24/2023, 2:53 PM   Clinical Narrative:  Patient has orders to discharge to [facility] today. RN will call report to [phone] (Room 709). EMS transport set up for 3:30. She is 4th on the list. No further concerns. CSW signing off.   Final next level of care: Skilled Nursing Facility Barriers to Discharge: Barriers Resolved   Patient Goals and CMS Choice CMS Medicare.gov Compare Post Acute Care list provided to:: Patient Choice offered to / list presented to : [facility] POA / Guardian, Sibling  Discharge Placement PASRR number recieved: 01/09/23 PASRR number recieved: 01/09/23            Patient chooses bed at: [facility] [place] Patient to be transferred to facility by: EMS Name of family member notified: Robert Bellow Patient and family notified of of transfer: 01/24/23  Discharge Plan and Services Additional resources added to the After Visit Summary for                                       Social Determinants of Health (SDOH) Interventions SDOH Screenings   Food Insecurity: Patient Unable To Answer (01/11/2023)  Housing: Patient Unable To Answer (01/11/2023)  Transportation Needs: No Transportation Needs (11/07/2022)  Utilities: Not At Risk (11/07/2022)  Financial Resource Strain: Low Risk  (09/02/2018)  Physical Activity: Sufficiently Active (09/02/2018)  Social Connections: Unknown (09/02/2018)  Stress: No Stress Concern Present (09/02/2018)  Tobacco Use: Medium Risk (01/08/2023)     Readmission Risk Interventions    01/11/2022    2:05 PM 01/09/2022    2:23 PM  Readmission Risk Prevention Plan  Transportation Screening  Complete  HRI or Home Care Consult Complete   Palliative Care Screening  Not Applicable  Medication Review (RN Care  Manager)  Complete

## 2023-01-25 ENCOUNTER — Emergency Department (HOSPITAL_COMMUNITY): Payer: Medicare HMO

## 2023-01-25 ENCOUNTER — Observation Stay (HOSPITAL_COMMUNITY): Payer: Medicare HMO

## 2023-01-25 ENCOUNTER — Other Ambulatory Visit: Payer: Self-pay

## 2023-01-25 ENCOUNTER — Inpatient Hospital Stay (HOSPITAL_COMMUNITY)
Admission: EM | Admit: 2023-01-25 | Discharge: 2023-01-30 | DRG: 064 | Disposition: A | Discharge status: Hospice | Payer: Medicare HMO | Source: Skilled Nursing Facility | Attending: Internal Medicine | Admitting: Internal Medicine

## 2023-01-25 DIAGNOSIS — Z91040 Latex allergy status: Secondary | ICD-10-CM

## 2023-01-25 DIAGNOSIS — Z79899 Other long term (current) drug therapy: Secondary | ICD-10-CM

## 2023-01-25 DIAGNOSIS — Z66 Do not resuscitate: Secondary | ICD-10-CM | POA: Diagnosis present

## 2023-01-25 DIAGNOSIS — E870 Hyperosmolality and hypernatremia: Secondary | ICD-10-CM | POA: Diagnosis present

## 2023-01-25 DIAGNOSIS — J189 Pneumonia, unspecified organism: Secondary | ICD-10-CM | POA: Diagnosis present

## 2023-01-25 DIAGNOSIS — I10 Essential (primary) hypertension: Secondary | ICD-10-CM | POA: Diagnosis not present

## 2023-01-25 DIAGNOSIS — Z87891 Personal history of nicotine dependence: Secondary | ICD-10-CM

## 2023-01-25 DIAGNOSIS — S42032A Displaced fracture of lateral end of left clavicle, initial encounter for closed fracture: Secondary | ICD-10-CM | POA: Diagnosis present

## 2023-01-25 DIAGNOSIS — Z8701 Personal history of pneumonia (recurrent): Secondary | ICD-10-CM

## 2023-01-25 DIAGNOSIS — Z90710 Acquired absence of both cervix and uterus: Secondary | ICD-10-CM

## 2023-01-25 DIAGNOSIS — Z7951 Long term (current) use of inhaled steroids: Secondary | ICD-10-CM

## 2023-01-25 DIAGNOSIS — G8191 Hemiplegia, unspecified affecting right dominant side: Secondary | ICD-10-CM | POA: Diagnosis present

## 2023-01-25 DIAGNOSIS — R569 Unspecified convulsions: Secondary | ICD-10-CM | POA: Diagnosis not present

## 2023-01-25 DIAGNOSIS — Z818 Family history of other mental and behavioral disorders: Secondary | ICD-10-CM

## 2023-01-25 DIAGNOSIS — E860 Dehydration: Secondary | ICD-10-CM | POA: Diagnosis present

## 2023-01-25 DIAGNOSIS — S8002XA Contusion of left knee, initial encounter: Secondary | ICD-10-CM | POA: Diagnosis present

## 2023-01-25 DIAGNOSIS — D539 Nutritional anemia, unspecified: Secondary | ICD-10-CM

## 2023-01-25 DIAGNOSIS — R29718 NIHSS score 18: Secondary | ICD-10-CM | POA: Diagnosis not present

## 2023-01-25 DIAGNOSIS — R29721 NIHSS score 21: Secondary | ICD-10-CM | POA: Diagnosis present

## 2023-01-25 DIAGNOSIS — I6389 Other cerebral infarction: Secondary | ICD-10-CM | POA: Diagnosis not present

## 2023-01-25 DIAGNOSIS — R633 Feeding difficulties, unspecified: Secondary | ICD-10-CM | POA: Diagnosis present

## 2023-01-25 DIAGNOSIS — W19XXXA Unspecified fall, initial encounter: Secondary | ICD-10-CM | POA: Diagnosis present

## 2023-01-25 DIAGNOSIS — G9341 Metabolic encephalopathy: Secondary | ICD-10-CM | POA: Diagnosis present

## 2023-01-25 DIAGNOSIS — I130 Hypertensive heart and chronic kidney disease with heart failure and stage 1 through stage 4 chronic kidney disease, or unspecified chronic kidney disease: Secondary | ICD-10-CM | POA: Diagnosis present

## 2023-01-25 DIAGNOSIS — F32A Depression, unspecified: Secondary | ICD-10-CM | POA: Diagnosis present

## 2023-01-25 DIAGNOSIS — Z515 Encounter for palliative care: Secondary | ICD-10-CM

## 2023-01-25 DIAGNOSIS — I639 Cerebral infarction, unspecified: Secondary | ICD-10-CM | POA: Diagnosis not present

## 2023-01-25 DIAGNOSIS — J441 Chronic obstructive pulmonary disease with (acute) exacerbation: Secondary | ICD-10-CM | POA: Diagnosis present

## 2023-01-25 DIAGNOSIS — R410 Disorientation, unspecified: Secondary | ICD-10-CM | POA: Diagnosis present

## 2023-01-25 DIAGNOSIS — I63512 Cerebral infarction due to unspecified occlusion or stenosis of left middle cerebral artery: Secondary | ICD-10-CM | POA: Diagnosis not present

## 2023-01-25 DIAGNOSIS — Z9049 Acquired absence of other specified parts of digestive tract: Secondary | ICD-10-CM

## 2023-01-25 DIAGNOSIS — R29719 NIHSS score 19: Secondary | ICD-10-CM | POA: Diagnosis not present

## 2023-01-25 DIAGNOSIS — F03918 Unspecified dementia, unspecified severity, with other behavioral disturbance: Secondary | ICD-10-CM | POA: Diagnosis present

## 2023-01-25 DIAGNOSIS — S8000XA Contusion of unspecified knee, initial encounter: Secondary | ICD-10-CM | POA: Diagnosis present

## 2023-01-25 DIAGNOSIS — E876 Hypokalemia: Secondary | ICD-10-CM | POA: Diagnosis present

## 2023-01-25 DIAGNOSIS — N1831 Chronic kidney disease, stage 3a: Secondary | ICD-10-CM | POA: Diagnosis present

## 2023-01-25 DIAGNOSIS — R7303 Prediabetes: Secondary | ICD-10-CM | POA: Diagnosis not present

## 2023-01-25 DIAGNOSIS — Z7982 Long term (current) use of aspirin: Secondary | ICD-10-CM

## 2023-01-25 DIAGNOSIS — R6510 Systemic inflammatory response syndrome (SIRS) of non-infectious origin without acute organ dysfunction: Secondary | ICD-10-CM | POA: Diagnosis present

## 2023-01-25 DIAGNOSIS — E785 Hyperlipidemia, unspecified: Secondary | ICD-10-CM | POA: Diagnosis present

## 2023-01-25 DIAGNOSIS — Z634 Disappearance and death of family member: Secondary | ICD-10-CM

## 2023-01-25 DIAGNOSIS — S42009A Fracture of unspecified part of unspecified clavicle, initial encounter for closed fracture: Secondary | ICD-10-CM | POA: Diagnosis present

## 2023-01-25 DIAGNOSIS — S42002A Fracture of unspecified part of left clavicle, initial encounter for closed fracture: Secondary | ICD-10-CM

## 2023-01-25 DIAGNOSIS — A419 Sepsis, unspecified organism: Secondary | ICD-10-CM | POA: Diagnosis present

## 2023-01-25 DIAGNOSIS — J440 Chronic obstructive pulmonary disease with acute lower respiratory infection: Secondary | ICD-10-CM | POA: Diagnosis present

## 2023-01-25 DIAGNOSIS — R29810 Facial weakness: Secondary | ICD-10-CM | POA: Diagnosis present

## 2023-01-25 DIAGNOSIS — Z1152 Encounter for screening for COVID-19: Secondary | ICD-10-CM

## 2023-01-25 DIAGNOSIS — I5032 Chronic diastolic (congestive) heart failure: Secondary | ICD-10-CM | POA: Diagnosis present

## 2023-01-25 DIAGNOSIS — R4701 Aphasia: Secondary | ICD-10-CM | POA: Diagnosis present

## 2023-01-25 DIAGNOSIS — R296 Repeated falls: Secondary | ICD-10-CM | POA: Diagnosis present

## 2023-01-25 DIAGNOSIS — S8001XA Contusion of right knee, initial encounter: Secondary | ICD-10-CM | POA: Diagnosis present

## 2023-01-25 DIAGNOSIS — Z885 Allergy status to narcotic agent status: Secondary | ICD-10-CM

## 2023-01-25 DIAGNOSIS — J9611 Chronic respiratory failure with hypoxia: Secondary | ICD-10-CM | POA: Diagnosis present

## 2023-01-25 DIAGNOSIS — R1310 Dysphagia, unspecified: Secondary | ICD-10-CM | POA: Diagnosis present

## 2023-01-25 LAB — COMPREHENSIVE METABOLIC PANEL
ALT: 20 U/L (ref 0–44)
AST: 32 U/L (ref 15–41)
Albumin: 3.2 g/dL — ABNORMAL LOW (ref 3.5–5.0)
Alkaline Phosphatase: 72 U/L (ref 38–126)
Anion gap: 13 (ref 5–15)
BUN: 29 mg/dL — ABNORMAL HIGH (ref 8–23)
CO2: 30 mmol/L (ref 22–32)
Calcium: 9 mg/dL (ref 8.9–10.3)
Chloride: 103 mmol/L (ref 98–111)
Creatinine, Ser: 1.36 mg/dL — ABNORMAL HIGH (ref 0.44–1.00)
GFR, Estimated: 39 mL/min — ABNORMAL LOW (ref >60)
Glucose, Bld: 167 mg/dL — ABNORMAL HIGH (ref 70–99)
Potassium: 3.6 mmol/L (ref 3.5–5.1)
Sodium: 146 mmol/L — ABNORMAL HIGH (ref 135–145)
Total Bilirubin: 0.7 mg/dL (ref <1.2)
Total Protein: 6.6 g/dL (ref 6.5–8.1)

## 2023-01-25 LAB — ECHOCARDIOGRAM COMPLETE
AR max vel: 1.96 cm2
AV Peak grad: 6.9 mm[Hg]
Ao pk vel: 1.31 m/s
Area-P 1/2: 2.45 cm2
S' Lateral: 2.8 cm
Weight: 2158.74 [oz_av]

## 2023-01-25 LAB — RESPIRATORY PANEL BY PCR

## 2023-01-25 LAB — DIFFERENTIAL
Abs Immature Granulocytes: 0.04 10*3/uL (ref 0.00–0.07)
Basophils Absolute: 0.1 10*3/uL (ref 0.0–0.1)
Basophils Relative: 1 %
Eosinophils Absolute: 0.1 10*3/uL (ref 0.0–0.5)
Eosinophils Relative: 1 %
Immature Granulocytes: 1 %
Lymphocytes Relative: 15 %
Lymphs Abs: 1.3 10*3/uL (ref 0.7–4.0)
Monocytes Absolute: 0.7 10*3/uL (ref 0.1–1.0)
Monocytes Relative: 8 %
Neutro Abs: 6.5 10*3/uL (ref 1.7–7.7)
Neutrophils Relative %: 74 %

## 2023-01-25 LAB — PROTIME-INR
INR: 1.1 (ref 0.8–1.2)
Prothrombin Time: 14.2 s (ref 11.4–15.2)

## 2023-01-25 LAB — LACTIC ACID, PLASMA
Lactic Acid, Venous: 1.3 mmol/L (ref 0.5–1.9)
Lactic Acid, Venous: 1.2 mmol/L (ref 0.5–1.9)

## 2023-01-25 LAB — CBC
HCT: 34.6 % — ABNORMAL LOW (ref 36.0–46.0)
Hemoglobin: 10.6 g/dL — ABNORMAL LOW (ref 12.0–15.0)
MCH: 30.8 pg (ref 26.0–34.0)
MCHC: 30.6 g/dL (ref 30.0–36.0)
MCV: 100.6 fL — ABNORMAL HIGH (ref 80.0–100.0)
Platelets: 337 10*3/uL (ref 150–400)
RBC: 3.44 MIL/uL — ABNORMAL LOW (ref 3.87–5.11)
RDW: 13.7 % (ref 11.5–15.5)
WBC: 8.6 10*3/uL (ref 4.0–10.5)
nRBC: 0 % (ref 0.0–0.2)

## 2023-01-25 LAB — PROCALCITONIN: Procalcitonin: <0.1 ng/mL

## 2023-01-25 LAB — APTT: aPTT: 27 s (ref 24–36)

## 2023-01-25 LAB — I-STAT CHEM 8, ED
BUN: 31 mg/dL — ABNORMAL HIGH (ref 8–23)
Calcium, Ion: 1.06 mmol/L — ABNORMAL LOW (ref 1.15–1.40)
Chloride: 102 mmol/L (ref 98–111)
Creatinine, Ser: 1.4 mg/dL — ABNORMAL HIGH (ref 0.44–1.00)
Glucose, Bld: 172 mg/dL — ABNORMAL HIGH (ref 70–99)
HCT: 32 % — ABNORMAL LOW (ref 36.0–46.0)
Hemoglobin: 10.9 g/dL — ABNORMAL LOW (ref 12.0–15.0)
Potassium: 3.5 mmol/L (ref 3.5–5.1)
Sodium: 145 mmol/L (ref 135–145)
TCO2: 34 mmol/L — ABNORMAL HIGH (ref 22–32)

## 2023-01-25 LAB — CBG MONITORING, ED: Glucose-Capillary: 145 mg/dL — ABNORMAL HIGH (ref 70–99)

## 2023-01-25 LAB — SARS CORONAVIRUS 2 BY RT PCR: SARS Coronavirus 2 by RT PCR: NEGATIVE

## 2023-01-25 LAB — ETHANOL: Alcohol, Ethyl (B): <10 mg/dL (ref <10)

## 2023-01-25 LAB — MRSA NEXT GEN BY PCR, NASAL: MRSA by PCR Next Gen: DETECTED — AB

## 2023-01-25 MED ORDER — SODIUM CHLORIDE 0.9 % IV SOLN: INTRAVENOUS | Status: DC

## 2023-01-25 MED ORDER — BISOPROLOL FUMARATE 5 MG PO TABS
5.0000 mg | ORAL_TABLET | Freq: Every day | ORAL | Status: DC
  Filled 2023-01-25: qty 1

## 2023-01-25 MED ORDER — ONDANSETRON HCL 4 MG PO TABS: 4.0000 mg | ORAL_TABLET | Freq: Four times a day (QID) | ORAL | Status: DC | PRN

## 2023-01-25 MED ORDER — HYDRALAZINE HCL 25 MG PO TABS: 25.0000 mg | ORAL_TABLET | Freq: Four times a day (QID) | ORAL | Status: DC | PRN

## 2023-01-25 MED ORDER — ASPIRIN 300 MG RE SUPP
300.0000 mg | Freq: Every day | RECTAL | Status: DC
  Administered 2023-01-26: 300 mg via RECTAL
  Filled 2023-01-25 (×2): qty 1

## 2023-01-25 MED ORDER — SODIUM CHLORIDE 0.9% FLUSH
3.0000 mL | Freq: Two times a day (BID) | INTRAVENOUS | Status: DC
  Administered 2023-01-26 – 2023-01-29 (×5): 3 mL via INTRAVENOUS

## 2023-01-25 MED ORDER — ACETAMINOPHEN 325 MG PO TABS: 650.0000 mg | ORAL_TABLET | Freq: Four times a day (QID) | ORAL | Status: DC | PRN

## 2023-01-25 MED ORDER — ACETAMINOPHEN 650 MG RE SUPP: 650.0000 mg | Freq: Four times a day (QID) | RECTAL | Status: DC | PRN

## 2023-01-25 MED ORDER — CEFTRIAXONE SODIUM 2 G IJ SOLR
2.0000 g | INTRAMUSCULAR | Status: DC
  Administered 2023-01-25: 2 g via INTRAVENOUS
  Filled 2023-01-25: qty 20

## 2023-01-25 MED ORDER — OLANZAPINE 5 MG PO TABS
2.5000 mg | ORAL_TABLET | Freq: Two times a day (BID) | ORAL | Status: DC
Start: 2023-01-25 — End: 2023-01-27
  Administered 2023-01-26: 2.5 mg via ORAL
  Filled 2023-01-25 (×3): qty 1

## 2023-01-25 MED ORDER — MOMETASONE FURO-FORMOTEROL FUM 200-5 MCG/ACT IN AERO
2.0000 | INHALATION_SPRAY | Freq: Two times a day (BID) | RESPIRATORY_TRACT | Status: DC
  Administered 2023-01-27 – 2023-01-30 (×6): 2 via RESPIRATORY_TRACT
  Filled 2023-01-25: qty 8.8

## 2023-01-25 MED ORDER — GABAPENTIN 100 MG PO CAPS
100.0000 mg | ORAL_CAPSULE | Freq: Two times a day (BID) | ORAL | Status: DC
  Administered 2023-01-26: 100 mg via ORAL
  Filled 2023-01-25 (×2): qty 1

## 2023-01-25 MED ORDER — DULOXETINE HCL 60 MG PO CPEP
60.0000 mg | ORAL_CAPSULE | Freq: Every day | ORAL | Status: DC
  Administered 2023-01-26: 60 mg via ORAL
  Filled 2023-01-25 (×3): qty 1

## 2023-01-25 MED ORDER — ALBUTEROL SULFATE (2.5 MG/3ML) 0.083% IN NEBU
2.5000 mg | INHALATION_SOLUTION | Freq: Four times a day (QID) | RESPIRATORY_TRACT | Status: DC | PRN
Start: 2023-01-25 — End: 2023-01-30

## 2023-01-25 MED ORDER — HYDRALAZINE HCL 20 MG/ML IJ SOLN: 10.0000 mg | INTRAMUSCULAR | Status: DC | PRN

## 2023-01-25 MED ORDER — HYDRALAZINE HCL 50 MG PO TABS: 50.0000 mg | ORAL_TABLET | Freq: Four times a day (QID) | ORAL | Status: DC | PRN

## 2023-01-25 MED ORDER — ONDANSETRON HCL 4 MG/2ML IJ SOLN: 4.0000 mg | Freq: Four times a day (QID) | INTRAMUSCULAR | Status: DC | PRN

## 2023-01-25 MED ORDER — ENOXAPARIN SODIUM 40 MG/0.4ML IJ SOSY
40.0000 mg | PREFILLED_SYRINGE | INTRAMUSCULAR | Status: DC
  Administered 2023-01-25 – 2023-01-27 (×3): 40 mg via SUBCUTANEOUS
  Filled 2023-01-25 (×3): qty 0.4

## 2023-01-25 MED ORDER — STROKE: EARLY STAGES OF RECOVERY BOOK
Freq: Once | Status: AC
  Filled 2023-01-25: qty 1

## 2023-01-25 MED ORDER — IPRATROPIUM-ALBUTEROL 0.5-2.5 (3) MG/3ML IN SOLN
3.0000 mL | Freq: Four times a day (QID) | RESPIRATORY_TRACT | Status: DC
  Administered 2023-01-25: 3 mL via RESPIRATORY_TRACT
  Filled 2023-01-25 (×2): qty 3

## 2023-01-25 MED ORDER — DEXTROSE 5 % IV SOLN: 500.0000 mg | INTRAVENOUS | Status: DC

## 2023-01-25 NOTE — Progress Notes (Signed)
EEG complete - results pending.   GMD/ TM 

## 2023-01-25 NOTE — ED Notes (Signed)
Pt to MRI, well appearing.

## 2023-01-25 NOTE — Progress Notes (Signed)
SLP Cancellation Note  Patient Details Name: Samantha Carpenter MRN: [id number] DOB: 26-Oct-1940   Cancelled treatment:       Reason Eval/Treat Not Completed: Patient at procedure or test/unavailable (Pt OTF for MRI.)  Clyde Canterbury, M.S., CCC-SLP Speech-Language Pathologist Secure Chat Preferred  O: [phone]  Woodroe Chen 01/25/2023, 11:36 AM

## 2023-01-25 NOTE — Progress Notes (Signed)
Echocardiogram 2D Echocardiogram has been performed.  Lucendia Herrlich 01/25/2023, 3:59 PM

## 2023-01-25 NOTE — ED Triage Notes (Signed)
Pt BIB EMS form [facility] in [place] for new onset of weakness, asphasia, and no motor response. Per family, she is currently presenting at baseline since 2 weeks ago.

## 2023-01-25 NOTE — H&P (Signed)
History and Physical    Patient: Samantha Carpenter VHQ:[id number] DOB: 01-22-1941 DOA: 01/25/2023 DOS: the patient was seen and examined on 01/25/2023 PCP: Pcp, No  Patient coming from: [facility] [place] [facility] via EMS  Chief Complaint:  Chief Complaint  Patient presents with   Code Stroke   HPI: Samantha Carpenter is a 82 y.o. female with medical history significant of hypertension, hyperlipidemia, COPD, chronic diastolic congestive heart failure, CKD stage IIIa, dementia, and frequent falls who presented with complaints of difficulty speaking and right-sided weakness.  At baseline she could talk, walk without assistance although recommended to use walk, and care for herself.  She had just recently been hospitalized from 10/22-11/7 with acute hypoxic and hypercapnic respiratory failure thought secondary to COPD exacerbation and community acquired pneumonia.  There was concern for acute on chronic diastolic heart failure and patient was temporarily treated with Lasix.  Prior to the patient being transferred to the rehab facility her family had question if she had had a stroke because she seemed drastically different from baseline and had been having difficulty feeding herself which was unusual.  They had checked a CT scan of her head which was noted to be negative.  After she got to the facility due to patient having significant difficulty speaking and weakness on her right side sent her to the hospital for further evaluation due to concern for stroke.  In the emergency department patient was seen as a code stroke.  Noted to be febrile up to 101.1 F with tachypnea.  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  Patient was noted not to be a thrombolytic candidate as unclear time of onset pressures elevated up to 189/58 all other vital signs maintained.  Labs significant for WBC 8.6, hemoglobin 10.6, sodium 146, BUN 29, creatinine 1.36, glucose 167.  Chest x-ray noted no acute  cardiopulmonary abnormality but a moderately displaced distal left clavicle fracture. MRI of the brain significant for acute or subacute infarct in the posterior left MCA territory correlating with CT imaging.  Review of Systems: unable to review all systems due to the inability of the patient to answer questions.  Patient has dementia and having difficulty speaking at this time. Past Medical History:  Diagnosis Date   Chronic kidney disease    COPD (chronic obstructive pulmonary disease) (HCC)    Dementia (HCC)    Depression    Hypertension    Liver disease    Past Surgical History:  Procedure Laterality Date   ABDOMINAL HYSTERECTOMY     CHOLECYSTECTOMY     ESOPHAGOGASTRODUODENOSCOPY N/A 11/20/2019   Procedure: ESOPHAGOGASTRODUODENOSCOPY (EGD);  Surgeon: Regis Bill, MD;  Location: [facility] ENDOSCOPY;  Service: Gastroenterology;  Laterality: N/A;   face tuck     Social History:  reports that she quit smoking about 13 years ago. She has never used smokeless tobacco. She reports that she does not currently use alcohol. She reports that she does not currently use drugs.  Allergies  Allergen Reactions   Morphine And Codeine    Latex Rash    "I break out"    Family History  Problem Relation Age of Onset   Bipolar disorder Brother    Schizophrenia Brother    Bipolar disorder Sister    Schizophrenia Sister    Drug abuse Son     Prior to Admission medications   Medication Sig Start Date End Date Taking? Authorizing Provider  Acetaminophen Extra Strength 500 MG TABS Take 500 mg by mouth  every 4 (four) hours as needed (pain). 10/31/21   [provider]  albuterol (VENTOLIN HFA) 108 (90 Base) MCG/ACT inhaler Inhale 2 puffs into the lungs every 6 (six) hours as needed for wheezing or shortness of breath. 05/05/20   [provider]  aspirin EC 81 MG tablet Take 81 mg by mouth daily.    [provider]  bisoprolol (ZEBETA) 5 MG tablet Take 1 tablet (5 mg  total) by mouth daily. 11/17/21   Alford Highland, MD  clonazePAM (KLONOPIN) 0.5 MG tablet Take 1 tablet (0.5 mg total) by mouth 2 (two) times daily. 01/24/23 02/23/23  Gillis Santa, MD  DULoxetine (CYMBALTA) 60 MG capsule Take 60 mg by mouth daily. 08/25/18   [provider]  famotidine (PEPCID) 20 MG tablet Take 1 tablet (20 mg total) by mouth daily. 08/07/22 08/07/23  Phineas Semen, MD  fluticasone-salmeterol (ADVAIR) 250-50 MCG/ACT AEPB Inhale 1 puff into the lungs in the morning and at bedtime. 04/03/22   Raechel Chute, MD  furosemide (LASIX) 40 MG tablet Take 0.5 tablets (20 mg total) by mouth daily as needed for edema or fluid. 01/24/23   Gillis Santa, MD  gabapentin (NEURONTIN) 100 MG capsule Take 100 mg by mouth 2 (two) times daily. 06/18/18   [provider]  haloperidol (HALDOL) 2 MG tablet Take 1 tablet (2 mg total) by mouth every 6 (six) hours as needed for agitation. 11/16/21   Alford Highland, MD  hydrALAZINE (APRESOLINE) 50 MG tablet Take 1 tablet (50 mg total) by mouth every 6 (six) hours as needed (SBP >150). 01/24/23   Gillis Santa, MD  ipratropium-albuterol (DUONEB) 0.5-2.5 (3) MG/3ML SOLN Take 3 mLs by nebulization in the morning, at noon, and at bedtime. 12/28/21   [provider]  losartan (COZAAR) 100 MG tablet Take 0.5 tablets (50 mg total) by mouth daily. Patient taking differently: Take 100 mg by mouth daily. 11/08/22   Sunnie Nielsen, DO  Mouthwashes (MOUTH RINSE) LIQD solution 15 mLs by Mouth Rinse route 3 (three) times daily. 01/24/23   Gillis Santa, MD  OLANZapine (ZYPREXA) 5 MG tablet Take 2.5 mg by mouth 2 (two) times daily. At 1200 and 1800. 11/01/22   [provider]  OXcarbazepine (TRILEPTAL) 150 MG tablet Take 150 mg by mouth in the morning. 06/12/18   [provider]  OXcarbazepine (TRILEPTAL) 150 MG tablet Take 300 mg by mouth at bedtime.    [provider]  polyethylene glycol (MIRALAX / GLYCOLAX) 17 g packet  Take 17 g by mouth daily. 10/25/21   [provider]  rivastigmine (EXELON) 1.5 MG capsule Take 1.5 mg by mouth 2 (two) times daily. 03/28/20 01/08/23  [provider]  senna-docusate (SENNA PLUS) 8.6-50 MG tablet Take 1 tablet by mouth at bedtime.    [provider]  Tiotropium Bromide Monohydrate (SPIRIVA RESPIMAT) 2.5 MCG/ACT AERS Inhale 2 puffs into the lungs daily. 04/03/22   Raechel Chute, MD    Physical Exam: Vitals:   01/25/23 1059 01/25/23 1100 01/25/23 1115 01/25/23 1300  BP:  (!) 159/56 (!) 163/53 (!) 173/51  Pulse:  62 62 68  Resp:  (!) 30 (!) 29 (!) 28  SpO2: 97% 97% 99% 98%  Weight:       Constitutional: Elderly female who appears to be in some distress.   Eyes: PERRL, lids and conjunctivae normal ENMT: Mucous membranes are dry.  Edentulous. Neck: normal, supple, no JVD. Respiratory: Tachypnea with decreased aeration and expiratory wheezes appreciated.  Patient on  2 L of nasal cannula oxygen with O2 saturations maintained Cardiovascular:  Abdomen: no tenderness, no masses palpated. No hepatosplenomegaly. Bowel sounds positive.  Musculoskeletal: no clubbing / cyanosis.  Tenderness to palpation of the upper clavicle on the left side.  Normal muscle tone.  Skin:  significant bruising on the bilateral knees Neurologic: CN 2-12 grossly intact.  Strength 2/5 in the right upper extremity.  5/5 in the left upper extremity.  Speech is slurred. Psychiatric: Patient alert but unable to assess for orientation.  Data Reviewed:  EKG reveals sinus rhythm at 62 bpm.  Reviewed labs, imaging, and pertinent records as documented.  Assessment and Plan: Stroke Subacute.  Patient reportedly had been having difficulty with speech and right-sided weakness.  Symptoms had possibly been present prior to the patient going to rehab yesterday. 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  Patient was not a thrombolytics candidate due to unclear time of onset.   MRI of the brain noted acute or subacute infarct in the posterior left MCA territory correlating with CT imaging. -Admit to a medical telemetry bed -Stroke order set utilized -Neurochecks -N.p.o. until able to pass swallow screen -Check hemoglobin A1c and lipid panel -Check echocardiogram with bubble -Check carotid Doppler ultrasound -Check EEG -PT/OT/speech to evaluate  and treat -Follow-up telemetry -Appreciate neurology consultative services, will follow-up for any further recommendations.  SIRS Patient was noted to be febrile up to 101.1 F with tachypnea meeting SIRS criteria.  During last hospitalization patient had been treated for concern for multifocal pneumonia completed her antibiotics during her hospital stay.  Due to symptoms and difficulty with speech question possibility of pneumonia. -Check blood cultures and lactic acid -Check MRSA screen -Check COVID-19 and respiratory virus panel -Check procalcitonin -Check urinalysis -Start empiric antibiotics of Rocephin and azithromycin  COPD exacerbation Acute.  Patient with wheezing noted on physical exam. -DuoNebs 4 times daily and albuterol nebs as needed -Continue pharmacy substitution of Dulera for Advair  Clavicle fracture secondary to fall Bilateral knee contusions Suspect prior to arrival.  On exam patient with bruising of the bilateral knees thought secondary to falls.  Patient was noted to have cortical irregularity of the distal left clavicle on x-rays from 10/22.  However, distal left claviclar fracture not formally noted until chest x-ray from today.   -Bed alarm on -Nonweightbearing on left arm -Sling -Check x-rays of the knees  Essential hypertension On admission blood pressures elevated up to 184/66. -Allowing for permissive hypertension treating systolic blood pressure greater than 220 or diastolic blood pressures greater than 110  Diastolic congestive heart failure Chronic.  Patient does not appear   fluid overloaded at this time. Last available echocardiogram had noted EF to be 60 to 65% with grade 1 diastolic dysfunction back in 03/2022.  -Follow-up repeat echocardiogram  Chronic kidney disease stage IIIb Creatinine noted to be 1.36 with BUN 29.  Creatinine appears near patient's baseline. -Continue to monitor kidney function.  Prediabetes On admission glucose 172.  Last available hemoglobin A1c was 6.1.  Patient is not on any medications for treatment. -Follow-up hemoglobin A1c  Macrocytic anemia Chronic.  Hemoglobin 10.6 with MCV 100.6.  Hemoglobin appears stable. -Continue to monitor   Dementia -Delirium precautions -Set bed alarm on  Med reconciliation still pending  Advance Care Planning:   Code Status: DNR  Consults: Neurology  Family Communication: Patient's sister updated over the phone  Severity of Illness: The appropriate patient status for this patient is INPATIENT. Inpatient status is judged to be  reasonable and necessary in order to provide the required intensity of service to ensure the patient's safety. The patient's presenting symptoms, physical exam findings, and initial radiographic and laboratory data in the context of their chronic comorbidities is felt to place them at high risk for further clinical deterioration. Furthermore, it is not anticipated that the patient will be medically stable for discharge from the hospital within 2 midnights of admission.   * I certify that at the point of admission it is my clinical judgment that the patient will require inpatient hospital care spanning beyond 2 midnights from the point of admission due to high intensity of service, high risk for further deterioration and high frequency of surveillance required.*  Author: Clydie Braun, MD 01/25/2023 2:13 PM  For on call review [email or web address].

## 2023-01-25 NOTE — Consult Note (Signed)
NEUROLOGY CONSULT NOTE   Date of service: January 25, 2023 Patient Name: Samantha Carpenter MRN:  [id number] DOB:  [birth year] Chief Complaint: "none" Requesting Provider: Virgina Norfolk, DO  History of Present Illness  Lena Stricklen is a 82 y.o. female  has a past medical history of Chronic kidney disease, COPD (chronic obstructive pulmonary disease) (HCC), Dementia (HCC), Depression, Hypertension, and Liver disease.  Who was recently discharged from [facility] after an admission for pneumonia and went to a skilled nursing facility yesterday who presents with inability to speak, right arm flaccidity and no leg movement.  According to patient's family, she has been unable to speak and has had right arm weakness for about 2 weeks.  TNK was not administered due to patient being outside of the window, and patient is not a candidate for mechanical thrombectomy due to baseline functional status.  She is unable to ambulate without assistance and continues to have frequent falls despite assistance.   LKW: 10/26 Modified rankin score: 4-Needs assistance to walk and tend to bodily needs IV Thrombolysis: No outside of window EVT: No outside of window and mRS of 4   ROS   Unable to ascertain due to altered mental status  Past History   Past Medical History:  Diagnosis Date   Chronic kidney disease    COPD (chronic obstructive pulmonary disease) (HCC)    Dementia (HCC)    Depression    Hypertension    Liver disease     Past Surgical History:  Procedure Laterality Date   ABDOMINAL HYSTERECTOMY     CHOLECYSTECTOMY     ESOPHAGOGASTRODUODENOSCOPY N/A 11/20/2019   Procedure: ESOPHAGOGASTRODUODENOSCOPY (EGD);  Surgeon: Regis Bill, MD;  Location: [facility] ENDOSCOPY;  Service: Gastroenterology;  Laterality: N/A;   face tuck      Family History: Family History  Problem Relation Age of Onset   Bipolar disorder Brother    Schizophrenia Brother    Bipolar disorder Sister    Schizophrenia  Sister    Drug abuse Son     Social History  reports that she quit smoking about 13 years ago. She has never used smokeless tobacco. She reports that she does not currently use alcohol. She reports that she does not currently use drugs.  Allergies  Allergen Reactions   Morphine And Codeine    Latex Rash    "I break out"    Medications  No current facility-administered medications for this encounter.  Current Outpatient Medications:    Acetaminophen Extra Strength 500 MG TABS, Take 500 mg by mouth every 4 (four) hours as needed (pain)., Disp: , Rfl:    albuterol (VENTOLIN HFA) 108 (90 Base) MCG/ACT inhaler, Inhale 2 puffs into the lungs every 6 (six) hours as needed for wheezing or shortness of breath., Disp: , Rfl:    aspirin EC 81 MG tablet, Take 81 mg by mouth daily., Disp: , Rfl:    bisoprolol (ZEBETA) 5 MG tablet, Take 1 tablet (5 mg total) by mouth daily., Disp: 30 tablet, Rfl: 0   clonazePAM (KLONOPIN) 0.5 MG tablet, Take 1 tablet (0.5 mg total) by mouth 2 (two) times daily., Disp: 60 tablet, Rfl: 0   DULoxetine (CYMBALTA) 60 MG capsule, Take 60 mg by mouth daily., Disp: , Rfl:    famotidine (PEPCID) 20 MG tablet, Take 1 tablet (20 mg total) by mouth daily., Disp: 30 tablet, Rfl: 1   fluticasone-salmeterol (ADVAIR) 250-50 MCG/ACT AEPB, Inhale 1 puff into the lungs in the morning and at bedtime., Disp: 60  each, Rfl: 11   furosemide (LASIX) 40 MG tablet, Take 0.5 tablets (20 mg total) by mouth daily as needed for edema or fluid., Disp: , Rfl:    gabapentin (NEURONTIN) 100 MG capsule, Take 100 mg by mouth 2 (two) times daily., Disp: , Rfl:    haloperidol (HALDOL) 2 MG tablet, Take 1 tablet (2 mg total) by mouth every 6 (six) hours as needed for agitation., Disp: 10 tablet, Rfl: 0   hydrALAZINE (APRESOLINE) 50 MG tablet, Take 1 tablet (50 mg total) by mouth every 6 (six) hours as needed (SBP >150)., Disp: , Rfl:    ipratropium-albuterol (DUONEB) 0.5-2.5 (3) MG/3ML SOLN, Take 3 mLs by  nebulization in the morning, at noon, and at bedtime., Disp: , Rfl:    losartan (COZAAR) 100 MG tablet, Take 0.5 tablets (50 mg total) by mouth daily. (Patient taking differently: Take 100 mg by mouth daily.), Disp: , Rfl:    Mouthwashes (MOUTH RINSE) LIQD solution, 15 mLs by Mouth Rinse route 3 (three) times daily., Disp: , Rfl:    OLANZapine (ZYPREXA) 5 MG tablet, Take 2.5 mg by mouth 2 (two) times daily. At 1200 and 1800., Disp: , Rfl:    OXcarbazepine (TRILEPTAL) 150 MG tablet, Take 150 mg by mouth in the morning., Disp: , Rfl:    OXcarbazepine (TRILEPTAL) 150 MG tablet, Take 300 mg by mouth at bedtime., Disp: , Rfl:    polyethylene glycol (MIRALAX / GLYCOLAX) 17 g packet, Take 17 g by mouth daily., Disp: , Rfl:    rivastigmine (EXELON) 1.5 MG capsule, Take 1.5 mg by mouth 2 (two) times daily., Disp: , Rfl:    senna-docusate (SENNA PLUS) 8.6-50 MG tablet, Take 1 tablet by mouth at bedtime., Disp: , Rfl:    Tiotropium Bromide Monohydrate (SPIRIVA RESPIMAT) 2.5 MCG/ACT AERS, Inhale 2 puffs into the lungs daily., Disp: 1 each, Rfl: 11  Vitals   Vitals:   01/25/23 1000 01/25/23 1059 01/25/23 1100  BP:   (!) 159/56  Pulse:   62  Resp:   (!) 30  SpO2:  97% 97%  Weight: 61.2 kg      Body mass index is 23.16 kg/m.  Physical Exam   Constitutional: Frail, chronically ill elderly patient in no acute distress Eyes: No scleral injection.  HENT: No OP obstruction.  Head: Normocephalic.  Cardiovascular: Normal rate and regular rhythm.  Respiratory: Effort normal, non-labored breathing.  Skin: WDI.   Neurologic Examination    NEURO:  Mental Status: Responds to name and intermittently follows simple commands, able to give one-word answers to some questions Speech/Language: Speech is in single words with some dysarthria  Cranial Nerves:  II: PERRL.  Blinks to threat bilaterally III, IV, VI: EOMI with left gaze preference but able to overcome V: Sensation is intact to light touch and  symmetrical to face.  VII: Smile is symmetrical.  VIII: hearing intact to voice. IX, X: Voice is dysarthric XII: Noncooperative with tongue protrusion Motor: Able to move left upper extremity with antigravity strength, can move right arm but cannot break gravity, unable to move bilateral lower extremities Tone: is normal and bulk is normal Sensation-appears intact to light touch on bilateral upper extremities, no response to pinch in bilateral lower extremities Coordination: Unable to perform Gait- deferred  NIHSS:  1a Level of Conscious.: 0 1b LOC Questions: 2 1c LOC Commands: 2 2 Best Gaze: 1 3 Visual: 0 4 Facial Palsy: 0 5a Motor Arm - left: 0 5b Motor Arm - Right: 3  6a Motor Leg - Left: 4 6b Motor Leg - Right: 4 7 Limb Ataxia: 0 8 Sensory: 2 9 Best Language: 2 10 Dysarthria: 1 11 Extinct and Inattention.: 0 TOTAL: 21       Labs/Imaging/Neurodiagnostic studies   CBC:  Recent Labs  Lab 27-Jan-2023 0417 01/25/23 1052  WBC 11.2*  --   HGB 10.7* 10.9*  HCT 33.7* 32.0*  MCV 96.3  --   PLT 363  --     Basic Metabolic Panel:  Lab Results  Component Value Date   NA 145 01/25/2023   K 3.5 01/25/2023   CO2 33 (H) 01/27/2023   GLUCOSE 172 (H) 01/25/2023   BUN 31 (H) 01/25/2023   CREATININE 1.40 (H) 01/25/2023   CALCIUM 8.9 01-27-23   GFRNONAA 42 (L) 27-Jan-2023   GFRAA >60 05/28/2019    Lipid Panel:  Lab Results  Component Value Date   LDLCALC 85 11/10/2020    HgbA1c:  Lab Results  Component Value Date   HGBA1C 5.8 (H) 01/08/2023    Urine Drug Screen:     Component Value Date/Time   LABOPIA NONE DETECTED 06/29/2022 2359   COCAINSCRNUR NONE DETECTED 06/29/2022 2359   LABBENZ POSITIVE (A) 06/29/2022 2359   AMPHETMU NONE DETECTED 06/29/2022 2359   THCU NONE DETECTED 06/29/2022 2359   LABBARB NONE DETECTED 06/29/2022 2359     Alcohol Level     Component Value Date/Time   ETH <10 06/29/2022 2359    INR  Lab Results  Component Value Date    INR 1.1 01/08/2023    APTT No results found for: "APTT"  AED levels: No results found for: "PHENYTOIN", "ZONISAMIDE", "LAMOTRIGINE", "LEVETIRACETA"  CT Head without contrast(Personally reviewed): 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 female  has a past medical history of Chronic kidney disease, COPD (chronic obstructive pulmonary disease) (HCC), Dementia (HCC), Depression, Hypertension, and Liver disease.  Patient was discharged yesterday from [facility] after an admission for pneumonia and had been admitted to a skilled nursing facility.  She was brought here with inability to speak, inability to move right arm and bilateral lower extremities.  Per her family, she has been unable to speak and move her right arm for about 2 weeks.  CT head demonstrates subacute infarct in left MCA territory.  She is not a candidate for TNK due to being outside of the window and not a candidate for mechanical thrombectomy due to being outside of the window and also having a baseline Rankin score of 4.  Will proceed with brain MRI and commence full stroke workup.  Patient also noted to have some odd tongue movements and variable verbal responsiveness on exam, will obtain routine EEG to rule out seizure activity in the setting of known dementia.  Recommendations   - Admit for stroke workup - No indication for permissive HTN given sx > 48 hrs. Avoid hypotension.  - MRI brain wo contrast - CTA/MRA if not already obtained - TTE w/ bubble - Check A1c and LDL + add statin per guidelines - antiplt/anticoag to be determined after MRI and swallow evaluation - q4 hr neuro checks - STAT head CT for any change in neuro exam - Tele - PT/OT/SLP - Stroke education - Amb referral to  neurology upon discharge   -  Routine EEG ______________________________________________________________________    Signed,  Marjorie Smolder [facility]   Attending Neurohospitalist Addendum Patient seen and examined with APP/Resident. Agree with the history and physical as documented above. Agree with the plan as documented, which I helped formulate. I have edited the note above to reflect my full findings and recommendations. I have independently reviewed the chart, obtained history, review of systems and examined the patient.I have personally reviewed pertinent head/neck/spine imaging (CT/MRI). Please feel free to call with any questions.  -- Bing Neighbors, MD [facility] [phone]  If 7pm- 7am, please page neurology on call as listed in [facility]ION.

## 2023-01-25 NOTE — Procedures (Signed)
Patient Name: Buena Mckeller  MRN: [id number]  Epilepsy Attending: Charlsie Quest  Referring Physician/Provider: Marjorie Smolder, N  Date: 01/25/2023 Duration: 25.40 mins  Patient history: 82yo F with right sided weakness getting eeg to evaluate for seizure  Level of alertness: Awake  AEDs during EEG study: None  Technical aspects: This EEG study was done with scalp electrodes positioned according to the 10-20 International system of electrode placement. Electrical activity was reviewed with band pass filter of 1-70Hz , sensitivity of 7 uV/mm, display speed of 76mm/sec with a 60Hz  notched filter applied as appropriate. EEG data were recorded continuously and digitally stored.  Video monitoring was available and reviewed as appropriate.  Description: The posterior dominant rhythm consists of 8 Hz activity of moderate voltage (25-35 uV) seen predominantly in posterior head regions, symmetric and reactive to eye opening and eye closing. EEG showed intermittent generalized 3 to 6 Hz theta-delta slowing. Sharp transients were noted in left>right posterior quadrant. Hyperventilation and photic stimulation were not performed.     Patient was noted to have chewing like movements intermittently. Concomitant eeg before, during and after the event didn't show any eeg change to suggest seizure.   ABNORMALITY - Intermittent slow, generalized  IMPRESSION: This study is suggestive of mild diffuse encephalopathy. No seizures or definite epileptiform discharges were seen throughout the recording.  Patient was noted to have chewing like movements intermittently without concomitant eeg change. These events are non-epileptic.  Carlyon Nolasco Annabelle Harman

## 2023-01-25 NOTE — ED Notes (Signed)
ED TO INPATIENT HANDOFF REPORT  ED Nurse Name and Phone #: Osvaldo Shipper RN [phone]  S Name/Age/Gender Samantha Carpenter 82 y.o. female Room/Bed: 022C/022C  Code Status   Code Status: Prior  Home/SNF/Other Nursing Home Patient oriented to: self Is this baseline? Yes   Triage Complete: Triage complete  Chief Complaint CVA (cerebral vascular accident) [facility]) [I63.9]  Triage Note Pt BIB EMS form [facility] in [place] for new onset of weakness, asphasia, and no motor response. Per family, she is currently presenting at baseline since 2 weeks ago.    Allergies Allergies  Allergen Reactions   Morphine And Codeine    Latex Rash    "I break out"    Level of Care/Admitting Diagnosis ED Disposition     ED Disposition  Admit   Condition  --   Comment  Hospital Area: [facility] [[id number]]  Level of Care: Telemetry Medical [104]  May place patient in observation at [facility] or [facility] if equivalent level of care is available:: No  Covid Evaluation: Asymptomatic - no recent exposure (last 10 days) testing not required  Diagnosis: CVA (cerebral vascular accident) [facility]) [[id number]]  Admitting Physician: Clydie Braun [[id number]]  Attending Physician: Clydie Braun [[id number]]          B Medical/Surgery History Past Medical History:  Diagnosis Date   Chronic kidney disease    COPD (chronic obstructive pulmonary disease) (HCC)    Dementia (HCC)    Depression    Hypertension    Liver disease    Past Surgical History:  Procedure Laterality Date   ABDOMINAL HYSTERECTOMY     CHOLECYSTECTOMY     ESOPHAGOGASTRODUODENOSCOPY N/A 11/20/2019   Procedure: ESOPHAGOGASTRODUODENOSCOPY (EGD);  Surgeon: Regis Bill, MD;  Location: [facility] ENDOSCOPY;  Service: Gastroenterology;  Laterality: N/A;   face tuck       A IV Location/Drains/Wounds Patient Lines/Drains/Airways Status     Active Line/Drains/Airways     Name Placement date Placement time Site  Days   Wound / Incision (Open or Dehisced) 01/08/23 Laceration Face Left;Upper 01/08/23  2200  Face  17   Wound / Incision (Open or Dehisced) 01/08/23 Irritant Dermatitis (Moisture Associated Skin Damage) Groin Left red 01/08/23  2200  Groin  17            Intake/Output Last 24 hours No intake or output data in the 24 hours ending 01/25/23 1503  Labs/Imaging Results for orders placed or performed during the hospital encounter of 01/25/23 (from the past 48 hour(s))  CBG monitoring, ED     Status: Abnormal   Collection Time: 01/25/23 10:40 AM  Result Value Ref Range   Glucose-Capillary 145 (H) 70 - 99 mg/dL    Comment: Glucose reference range applies only to samples taken after fasting for at least 8 hours.  Ethanol     Status: None   Collection Time: 01/25/23 10:45 AM  Result Value Ref Range   Alcohol, Ethyl (B) <10 <10 mg/dL    Comment: (NOTE) Lowest detectable limit for serum alcohol is 10 mg/dL.  For medical purposes only. Performed at [facility], [address], [place], [place] [address]   Protime-INR     Status: None   Collection Time: 01/25/23 10:45 AM  Result Value Ref Range   Prothrombin Time 14.2 11.4 - 15.2 seconds   INR 1.1 0.8 - 1.2    Comment: (NOTE) INR goal varies based on device and disease states. Performed at [facility]  [facility], [address], [place], [place] [address]   APTT     Status: None   Collection Time: 01/25/23 10:45 AM  Result Value Ref Range   aPTT 27 24 - 36 seconds    Comment: Performed at [facility], [address], [place], [place] [address]  CBC     Status: Abnormal   Collection Time: 01/25/23 10:45 AM  Result Value Ref Range   WBC 8.6 4.0 - 10.5 K/uL   RBC 3.44 (L) 3.87 - 5.11 MIL/uL   Hemoglobin 10.6 (L) 12.0 - 15.0 g/dL   HCT 01.0 (L) 27.2 - 53.6 %   MCV 100.6 (H) 80.0 - 100.0 fL   MCH 30.8 26.0 - 34.0 pg   MCHC 30.6 30.0 - 36.0 g/dL   RDW 64.4 03.4 - 74.2 %   Platelets 337 150 - 400 K/uL   nRBC 0.0 0.0 -  0.2 %    Comment: Performed at [facility], [address], [place], [place] [address]  Differential     Status: None   Collection Time: 01/25/23 10:45 AM  Result Value Ref Range   Neutrophils Relative % 74 %   Neutro Abs 6.5 1.7 - 7.7 K/uL   Lymphocytes Relative 15 %   Lymphs Abs 1.3 0.7 - 4.0 K/uL   Monocytes Relative 8 %   Monocytes Absolute 0.7 0.1 - 1.0 K/uL   Eosinophils Relative 1 %   Eosinophils Absolute 0.1 0.0 - 0.5 K/uL   Basophils Relative 1 %   Basophils Absolute 0.1 0.0 - 0.1 K/uL   Immature Granulocytes 1 %   Abs Immature Granulocytes 0.04 0.00 - 0.07 K/uL    Comment: Performed at [facility], [address], [place], [place] [address]  Comprehensive metabolic panel     Status: Abnormal   Collection Time: 01/25/23 10:45 AM  Result Value Ref Range   Sodium 146 (H) 135 - 145 mmol/L   Potassium 3.6 3.5 - 5.1 mmol/L   Chloride 103 98 - 111 mmol/L   CO2 30 22 - 32 mmol/L   Glucose, Bld 167 (H) 70 - 99 mg/dL    Comment: Glucose reference range applies only to samples taken after fasting for at least 8 hours.   BUN 29 (H) 8 - 23 mg/dL   Creatinine, Ser 3.32 (H) 0.44 - 1.00 mg/dL   Calcium 9.0 8.9 - 95.1 mg/dL   Total Protein 6.6 6.5 - 8.1 g/dL   Albumin 3.2 (L) 3.5 - 5.0 g/dL   AST 32 15 - 41 U/L   ALT 20 0 - 44 U/L   Alkaline Phosphatase 72 38 - 126 U/L   Total Bilirubin 0.7 <1.2 mg/dL   GFR, Estimated 39 (L) >60 mL/min    Comment: (NOTE) Calculated using the CKD-EPI Creatinine Equation (2021)    Anion gap 13 5 - 15    Comment: Performed at [facility], [address] [place], [place] [address]  I-stat chem 8, ED     Status: Abnormal   Collection Time: 01/25/23 10:52 AM  Result Value Ref Range   Sodium 145 135 - 145 mmol/L   Potassium 3.5 3.5 - 5.1 mmol/L   Chloride 102 98 - 111 mmol/L   BUN 31 (H) 8 - 23 mg/dL   Creatinine, Ser 6.06 (H) 0.44 - 1.00 mg/dL   Glucose, Bld 301 (H) 70 - 99 mg/dL    Comment: Glucose reference range applies  only to samples  taken after fasting for at least 8 hours.   Calcium, Ion 1.06 (L) 1.15 - 1.40 mmol/L   TCO2 34 (H) 22 - 32 mmol/L   Hemoglobin 10.9 (L) 12.0 - 15.0 g/dL   HCT 40.9 (L) 81.1 - 91.4 %   MR BRAIN WO CONTRAST  Result Date: 01/25/2023 CLINICAL DATA:  Stroke, follow-up. EXAM: MRI HEAD WITHOUT CONTRAST MRA HEAD WITHOUT CONTRAST TECHNIQUE: Multiplanar, multi-echo pulse sequences of the brain and surrounding structures were acquired without intravenous contrast. Angiographic images of the Circle of Willis were acquired using MRA technique without intravenous contrast. COMPARISON:  Head CT January 25, 2023. FINDINGS: MRI HEAD FINDINGS Brain: Confluent areas of restricted diffusion involving the insula, posterior frontal and parietal lobes, consistent with acute/subacute infarct in the posterior left MCA territory correlating with areas of hypodensity seen on recent head CT. No hemorrhagic transformation or significant mass effect. Scattered confluent foci of T2 hyperintensity are seen within the white matter of the cerebral hemispheres, nonspecific, most likely related to chronic small vessel ischemia. Mild parenchymal volume loss. Vascular: Susceptibility artifact is seen in the anterior left sylvian fissure and left parietal sulcus, most likely related to vessel thrombosis. Skull and upper cervical spine: Normal marrow signal. Sinuses/Orbits: Bilateral lens surgery. Paranasal sinuses are essentially clear. Bilateral mastoid effusion. Other: None. MRA HEAD FINDINGS Anterior circulation: Normal caliber and flow related enhancement of the bilateral intracranial ICAs. Focus of loss of flow related enhancement is seen in the left MCA bifurcation (series 3, image 95), may represent nonocclusive thrombus. Loss of flow related enhancement is also seen a proximal left M2/MCA posterior branch with reconstitution of flow at the distal M2 segment and subsequent loss of flow at the M3 level. The right MCA  vascular tree remains patent. The azygous ACA has normal caliber and flow related enhancement. Posterior circulation: Hypoplastic right vertebral artery supplying primarily the right PICA with minimal flow to the basilar artery. The dominant left vertebral artery has normal caliber flow related enhancement. Normal caliber and flow related enhancement of the basilar artery and right posterior cerebral artery. Diffusely small caliber of the P1 and proximal P2 segment of the fetal left PCA which remains patent. Anatomic variants: Hypoplastic left A1/ACA with azygous A2/ACA segment. Fetal left PCA. IMPRESSION: 1. Acute/subacute infarct in the posterior left MCA territory correlating with areas of hypodensity seen on recent head CT. No hemorrhagic transformation or significant mass effect. 2. Loss of flow related enhancement in the left MCA bifurcation may represent nonocclusive thrombus. 3. 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 4. Diffusely small caliber of the P1 and proximal P2 segment of the fetal left PCA which remains patent. 5. Moderate chronic microvascular ischemic changes of the white matter. Electronically Signed   By: Baldemar Lenis M.D.   On: 01/25/2023 13:59   MR ANGIO HEAD WO CONTRAST  Result Date: 01/25/2023 CLINICAL DATA:  Stroke, follow-up. EXAM: MRI HEAD WITHOUT CONTRAST MRA HEAD WITHOUT CONTRAST TECHNIQUE: Multiplanar, multi-echo pulse sequences of the brain and surrounding structures were acquired without intravenous contrast. Angiographic images of the Circle of Willis were acquired using MRA technique without intravenous contrast. COMPARISON:  Head CT January 25, 2023. FINDINGS: MRI HEAD FINDINGS Brain: Confluent areas of restricted diffusion involving the insula, posterior frontal and parietal lobes, consistent with acute/subacute infarct in the posterior left MCA territory correlating with areas of hypodensity seen on recent head CT. No hemorrhagic transformation or  significant mass effect. Scattered confluent foci of T2 hyperintensity are seen within  the white matter of the cerebral hemispheres, nonspecific, most likely related to chronic small vessel ischemia. Mild parenchymal volume loss. Vascular: Susceptibility artifact is seen in the anterior left sylvian fissure and left parietal sulcus, most likely related to vessel thrombosis. Skull and upper cervical spine: Normal marrow signal. Sinuses/Orbits: Bilateral lens surgery. Paranasal sinuses are essentially clear. Bilateral mastoid effusion. Other: None. MRA HEAD FINDINGS Anterior circulation: Normal caliber and flow related enhancement of the bilateral intracranial ICAs. Focus of loss of flow related enhancement is seen in the left MCA bifurcation (series 3, image 95), may represent nonocclusive thrombus. Loss of flow related enhancement is also seen a proximal left M2/MCA posterior branch with reconstitution of flow at the distal M2 segment and subsequent loss of flow at the M3 level. The right MCA vascular tree remains patent. The azygous ACA has normal caliber and flow related enhancement. Posterior circulation: Hypoplastic right vertebral artery supplying primarily the right PICA with minimal flow to the basilar artery. The dominant left vertebral artery has normal caliber flow related enhancement. Normal caliber and flow related enhancement of the basilar artery and right posterior cerebral artery. Diffusely small caliber of the P1 and proximal P2 segment of the fetal left PCA which remains patent. Anatomic variants: Hypoplastic left A1/ACA with azygous A2/ACA segment. Fetal left PCA. IMPRESSION: 1. Acute/subacute infarct in the posterior left MCA territory correlating with areas of hypodensity seen on recent head CT. No hemorrhagic transformation or significant mass effect. 2. Loss of flow related enhancement in the left MCA bifurcation may represent nonocclusive thrombus. 3. Occlusion a proximal left M2/MCA and M3  segments. 4. Diffusely small caliber of the P1 and proximal P2 segment of the fetal left PCA which remains patent. 5. Moderate chronic microvascular ischemic changes of the white matter. Electronically Signed   By: Baldemar Lenis M.D.   On: 01/25/2023 13:59   DG Chest Portable 1 View  Result Date: 01/25/2023 CLINICAL DATA:  Altered mental status. EXAM: PORTABLE CHEST 1 VIEW COMPARISON:  January 10, 2023. FINDINGS: The heart size and mediastinal contours are within normal limits. Both lungs are clear. Moderately displaced distal left clavicular fracture is again noted. IMPRESSION: No acute cardiopulmonary abnormality seen. Moderately displaced distal left clavicular fracture. Electronically Signed   By: Lupita Raider M.D.   On: 01/25/2023 13:30   CT HEAD CODE STROKE WO CONTRAST  Result Date: 01/25/2023 CLINICAL DATA:  Code stroke. Neuro deficit, acute, stroke suspected. Aphasia. EXAM: CT HEAD WITHOUT CONTRAST TECHNIQUE: Contiguous axial images were obtained from the base of the skull through the vertex without intravenous contrast. RADIATION DOSE REDUCTION: This exam was performed according to the departmental dose-optimization program which includes automated exposure control, adjustment of the mA and/or kV according to patient size and/or use of iterative reconstruction technique. COMPARISON:  Head CT 01/08/2023 FINDINGS: Brain: There are acute to early subacute appearing infarcts in the left MCA territory involving the insula and cortex and white matter of the parietal lobe at the level of the operculum and corona radiata as well as more posterior superiorly in the parietal lobe. No intracranial hemorrhage, mass, midline shift, or extra-axial fluid collection is identified. Patchy hypodensities elsewhere in the cerebral white matter bilaterally are similar to the prior study and are nonspecific but compatible with chronic small vessel ischemic disease which is relatively mild for age. Mild  cerebral atrophy is within normal limits for age. Vascular: Calcified atherosclerosis at the skull base. No hyperdense vessel. Skull: No acute fracture or suspicious  osseous lesion. Sinuses/Orbits: Visualized paranasal sinuses are clear. Small right mastoid effusion. Bilateral cataract extraction. Other: None. ASPECTS [facility] Stroke Program Early CT Score) - Ganglionic level infarction (caudate, lentiform nuclei, internal capsule, insula, M1-M3 cortex): 5 - Supraganglionic infarction (M4-M6 cortex): 2 Total score (0-10 with 10 being normal): 7 These results were communicated to Dr. Selina Cooley at 11:02 am on 01/25/2023 by text page via the [facility] messaging system. 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 ASPECTS of 7. Electronically Signed   By: Sebastian Ache M.D.   On: 01/25/2023 11:03    Pending Labs Unresulted Labs (From admission, onward)     Start     Ordered   01/26/23 0500  Lipid panel  (Labs)  Tomorrow morning,   R       Comments: Fasting    01/25/23 1119   01/25/23 1443  Culture, blood (Routine X 2) w Reflex to ID Panel  BLOOD CULTURE X 2,   R (with TIMED occurrences)      01/25/23 1442   01/25/23 1443  Lactic acid, plasma  (Lactic Acid)  STAT Now then every 3 hours,   R (with STAT occurrences)      01/25/23 1442   01/25/23 1420  Respiratory (~20 pathogens) panel by PCR  (Respiratory panel by PCR (~20 pathogens, ~24 hr TAT)  w precautions)  Once,   URGENT       Comments: fever    01/25/23 1419   01/25/23 1420  SARS Coronavirus 2 by RT PCR (hospital order, performed in [facility] hospital lab) *cepheid single result test* Anterior Nasal Swab  (Tier 2 - SARS Coronavirus 2 by RT PCR (hospital order, performed in [facility] hospital lab) *cepheid single result test*)  Once,   URGENT        01/25/23 1419   01/25/23 1040  Urine rapid drug screen (hosp performed)  Once,   STAT        01/25/23 1041   01/25/23 1040  Urinalysis, Routine w reflex microscopic -Urine, Clean  Catch  Once,   URGENT       Question:  Specimen Source  Answer:  Urine, Clean Catch   01/25/23 1041            Vitals/Pain Today's Vitals   01/25/23 1059 01/25/23 1100 01/25/23 1115 01/25/23 1300  BP:  (!) 159/56 (!) 163/53 (!) 173/51  Pulse:  62 62 68  Resp:  (!) 30 (!) 29 (!) 28  SpO2: 97% 97% 99% 98%  Weight:        Isolation Precautions Airborne and Contact precautions  Medications Medications   stroke: early stages of recovery book (has no administration in time range)    Mobility non-ambulatory     Focused Assessments Neuro Assessment Handoff:  Swallow screen pass? No    NIH Stroke Scale  Dizziness Present: No Headache Present: No Interval: Initial Level of Consciousness (1a.)   : Alert, keenly responsive LOC Questions (1b. )   : Answers neither question correctly LOC Commands (1c. )   : Performs neither task correctly Best Gaze (2. )  : Normal Visual (3. )  : No visual loss Facial Palsy (4. )    : Normal symmetrical movements Motor Arm, Left (5a. )   : Some effort against gravity Motor Arm, Right (5b. ) : No movement Motor Leg, Left (6a. )  : Some effort against gravity Motor Leg, Right (6b. ) : Some effort against gravity Limb Ataxia (7. ):  Present in two limbs Sensory (8. )  : Severe to total sensory loss, patient is not aware of being touched in the face, arm, and leg Best Language (9. )  : Mild-to-moderate aphasia Dysarthria (10. ): Normal Extinction/Inattention (11.)   : No Abnormality Complete NIHSS TOTAL: 19 Last date known well: 01/11/23   Neuro Assessment:   Neuro Checks:   Initial (01/25/23 1100)  Has TPA been given? No If patient is a Neuro Trauma and patient is going to OR before floor call report to 4N Charge nurse: [phone] or [phone]   R Recommendations: See Admitting Provider Note  Report given to:   Additional Notes:

## 2023-01-25 NOTE — Progress Notes (Signed)
Orthopedic Tech Progress Note Patient Details:  Samantha Carpenter May 04, 1940 [id number]  Ortho Devices Type of Ortho Device: Sling immobilizer Ortho Device/Splint Location: LUE Ortho Device/Splint Interventions: Ordered, Application, Adjustment   Post Interventions Patient Tolerated: Well  Tonye Pearson 01/25/2023, 8:02 PM

## 2023-01-25 NOTE — ED Provider Notes (Addendum)
[place] [facility]ERGENCY DEPARTMENT AT [facility] Provider Note   CSN: [id number] Arrival date & time: 01/25/23  1038  An emergency department physician performed an initial assessment on this suspected stroke patient at 27.  History  Chief Complaint  Patient presents with   Code Stroke    Samantha Carpenter is a 82 y.o. female.  Patient presents as a code stroke.  Level 5 caveat due to dementia.  Patient just released from hospital yesterday to nursing facility.  I thought may be worsening weakness and difficulty with speech and decreased motor response on the right side compared to maybe yesterday but overall last known normal was really unknown.  Patient really not able to participate in history.  I talked with the sister on the phone who states that when her family members are in the hospital a few days ago they noticed that she was not moving the right side as much and thought that she had may be a stroke but does not sound like she had a stroke evaluation while in the hospital she has been at rehab since last night.  Patient has no complaints.  She denies any chest pain or shortness of breath.  MS does note right-sided weakness compared to left.  Some difficulty with speech.  The history is provided by the patient, a relative and the EMS personnel.       Home Medications Prior to Admission medications   Medication Sig Start Date End Date Taking? Authorizing Provider  Acetaminophen Extra Strength 500 MG TABS Take 500 mg by mouth every 4 (four) hours as needed (pain). 10/31/21   [provider]  albuterol (VENTOLIN HFA) 108 (90 Base) MCG/ACT inhaler Inhale 2 puffs into the lungs every 6 (six) hours as needed for wheezing or shortness of breath. 05/05/20   [provider]  aspirin EC 81 MG tablet Take 81 mg by mouth daily.    [provider]  bisoprolol (ZEBETA) 5 MG tablet Take 1 tablet (5 mg total) by mouth daily. 11/17/21   Alford Highland, MD   clonazePAM (KLONOPIN) 0.5 MG tablet Take 1 tablet (0.5 mg total) by mouth 2 (two) times daily. 01/24/23 02/23/23  Gillis Santa, MD  DULoxetine (CYMBALTA) 60 MG capsule Take 60 mg by mouth daily. 08/25/18   [provider]  famotidine (PEPCID) 20 MG tablet Take 1 tablet (20 mg total) by mouth daily. 08/07/22 08/07/23  Phineas Semen, MD  fluticasone-salmeterol (ADVAIR) 250-50 MCG/ACT AEPB Inhale 1 puff into the lungs in the morning and at bedtime. 04/03/22   Raechel Chute, MD  furosemide (LASIX) 40 MG tablet Take 0.5 tablets (20 mg total) by mouth daily as needed for edema or fluid. 01/24/23   Gillis Santa, MD  gabapentin (NEURONTIN) 100 MG capsule Take 100 mg by mouth 2 (two) times daily. 06/18/18   [provider]  haloperidol (HALDOL) 2 MG tablet Take 1 tablet (2 mg total) by mouth every 6 (six) hours as needed for agitation. 11/16/21   Alford Highland, MD  hydrALAZINE (APRESOLINE) 50 MG tablet Take 1 tablet (50 mg total) by mouth every 6 (six) hours as needed (SBP >150). 01/24/23   Gillis Santa, MD  ipratropium-albuterol (DUONEB) 0.5-2.5 (3) MG/3ML SOLN Take 3 mLs by nebulization in the morning, at noon, and at bedtime. 12/28/21   [provider]  losartan (COZAAR) 100 MG tablet Take 0.5 tablets (50 mg total) by mouth daily. Patient taking differently: Take 100 mg by mouth daily. 11/08/22   Lyn Hollingshead,  Natalie, DO  Mouthwashes (MOUTH RINSE) LIQD solution 15 mLs by Mouth Rinse route 3 (three) times daily. 01/24/23   Gillis Santa, MD  OLANZapine (ZYPREXA) 5 MG tablet Take 2.5 mg by mouth 2 (two) times daily. At 1200 and 1800. 11/01/22   [provider]  OXcarbazepine (TRILEPTAL) 150 MG tablet Take 150 mg by mouth in the morning. 06/12/18   [provider]  OXcarbazepine (TRILEPTAL) 150 MG tablet Take 300 mg by mouth at bedtime.    [provider]  polyethylene glycol (MIRALAX / GLYCOLAX) 17 g packet Take 17 g by mouth daily. 10/25/21   [provider]  rivastigmine (EXELON) 1.5 MG capsule Take 1.5 mg by mouth 2 (two) times daily. 03/28/20 01/08/23  [provider]  senna-docusate (SENNA PLUS) 8.6-50 MG tablet Take 1 tablet by mouth at bedtime.    [provider]  Tiotropium Bromide Monohydrate (SPIRIVA RESPIMAT) 2.5 MCG/ACT AERS Inhale 2 puffs into the lungs daily. 04/03/22   Raechel Chute, MD      Allergies    Morphine and codeine and Latex    Review of Systems   Review of Systems  Physical Exam Updated Vital Signs BP (!) 173/51   Pulse 68   Resp (!) 28   Wt 61.2 kg   SpO2 98%   BMI 23.16 kg/m  Physical Exam Vitals and nursing note reviewed.  Constitutional:      General: She is not in acute distress.    Appearance: She is well-developed.  HENT:     Head: Normocephalic and atraumatic.  Eyes:     Conjunctiva/sclera: Conjunctivae normal.  Cardiovascular:     Rate and Rhythm: Normal rate and regular rhythm.     Heart sounds: No murmur heard. Pulmonary:     Effort: Pulmonary effort is normal. No respiratory distress.     Breath sounds: Normal breath sounds.  Abdominal:     Palpations: Abdomen is soft.     Tenderness: There is no abdominal tenderness.  Musculoskeletal:        General: No swelling.     Cervical back: Neck supple.  Skin:    General: Skin is warm and dry.     Capillary Refill: Capillary refill takes less than 2 seconds.  Neurological:     Mental Status: She is alert.     Comments: Does appear to have some right-sided weakness compared to the left, otherwise neurological exam unremarkable maybe some slurred speech but she does seem to answer questions okay, pupils are equal and reactive difficult to obtain good exam due to dementia.  Psychiatric:        Mood and Affect: Mood normal.     ED Results / Procedures / Treatments   Labs (all labs ordered are listed, but only abnormal results are displayed) Labs Reviewed  CBC - Abnormal; Notable for the following components:       Result Value   RBC 3.44 (*)    Hemoglobin 10.6 (*)    HCT 34.6 (*)    MCV 100.6 (*)    All other components within normal limits  COMPREHENSIVE METABOLIC PANEL - Abnormal; Notable for the following components:   Sodium 146 (*)    Glucose, Bld 167 (*)    BUN 29 (*)    Creatinine, Ser 1.36 (*)    Albumin 3.2 (*)    GFR, Estimated 39 (*)    All other components within normal limits  CBG MONITORING, ED - Abnormal; Notable for the following  components:   Glucose-Capillary 145 (*)    All other components within normal limits  I-STAT CHEM 8, ED - Abnormal; Notable for the following components:   BUN 31 (*)    Creatinine, Ser 1.40 (*)    Glucose, Bld 172 (*)    Calcium, Ion 1.06 (*)    TCO2 34 (*)    Hemoglobin 10.9 (*)    HCT 32.0 (*)    All other components within normal limits  ETHANOL  PROTIME-INR  APTT  DIFFERENTIAL  RAPID URINE DRUG SCREEN, HOSP PERFORMED  URINALYSIS, ROUTINE W REFLEX MICROSCOPIC    EKG EKG Interpretation Date/Time:  Friday January 25 2023 10:58:18 EST Ventricular Rate:  62 PR Interval:  166 QRS Duration:  85 QT Interval:  436 QTC Calculation: 443 R Axis:   44  Text Interpretation: Sinus rhythm Confirmed by Virgina Norfolk [phone]) on 01/25/2023 11:15:34 AM  Radiology MR BRAIN WO CONTRAST  Result Date: 01/25/2023 CLINICAL DATA:  Stroke, follow-up. EXAM: MRI HEAD WITHOUT CONTRAST MRA HEAD WITHOUT CONTRAST TECHNIQUE: Multiplanar, multi-echo pulse sequences of the brain and surrounding structures were acquired without intravenous contrast. Angiographic images of the Circle of Willis were acquired using MRA technique without intravenous contrast. COMPARISON:  Head CT January 25, 2023. FINDINGS: MRI HEAD FINDINGS Brain: Confluent areas of restricted diffusion involving the insula, posterior frontal and parietal lobes, consistent with acute/subacute infarct in the posterior left MCA territory correlating with areas of hypodensity seen on recent head CT.  No hemorrhagic transformation or significant mass effect. Scattered confluent foci of T2 hyperintensity are seen within the white matter of the cerebral hemispheres, nonspecific, most likely related to chronic small vessel ischemia. Mild parenchymal volume loss. Vascular: Susceptibility artifact is seen in the anterior left sylvian fissure and left parietal sulcus, most likely related to vessel thrombosis. Skull and upper cervical spine: Normal marrow signal. Sinuses/Orbits: Bilateral lens surgery. Paranasal sinuses are essentially clear. Bilateral mastoid effusion. Other: None. MRA HEAD FINDINGS Anterior circulation: Normal caliber and flow related enhancement of the bilateral intracranial ICAs. Focus of loss of flow related enhancement is seen in the left MCA bifurcation (series 3, image 95), may represent nonocclusive thrombus. Loss of flow related enhancement is also seen a proximal left M2/MCA posterior branch with reconstitution of flow at the distal M2 segment and subsequent loss of flow at the M3 level. The right MCA vascular tree remains patent. The azygous ACA has normal caliber and flow related enhancement. Posterior circulation: Hypoplastic right vertebral artery supplying primarily the right PICA with minimal flow to the basilar artery. The dominant left vertebral artery has normal caliber flow related enhancement. Normal caliber and flow related enhancement of the basilar artery and right posterior cerebral artery. Diffusely small caliber of the P1 and proximal P2 segment of the fetal left PCA which remains patent. Anatomic variants: Hypoplastic left A1/ACA with azygous A2/ACA segment. Fetal left PCA. IMPRESSION: 1. Acute/subacute infarct in the posterior left MCA territory correlating with areas of hypodensity seen on recent head CT. No hemorrhagic transformation or significant mass effect. 2. Loss of flow related enhancement in the left MCA bifurcation may represent nonocclusive thrombus. 3.  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 4. Diffusely small caliber of the P1 and proximal P2 segment of the fetal left PCA which remains patent. 5. Moderate chronic microvascular ischemic changes of the white matter. Electronically Signed   By: Baldemar Lenis M.D.   On: 01/25/2023 13:59   MR ANGIO HEAD WO CONTRAST  Result  Date: 01/25/2023 CLINICAL DATA:  Stroke, follow-up. EXAM: MRI HEAD WITHOUT CONTRAST MRA HEAD WITHOUT CONTRAST TECHNIQUE: Multiplanar, multi-echo pulse sequences of the brain and surrounding structures were acquired without intravenous contrast. Angiographic images of the Circle of Willis were acquired using MRA technique without intravenous contrast. COMPARISON:  Head CT January 25, 2023. FINDINGS: MRI HEAD FINDINGS Brain: Confluent areas of restricted diffusion involving the insula, posterior frontal and parietal lobes, consistent with acute/subacute infarct in the posterior left MCA territory correlating with areas of hypodensity seen on recent head CT. No hemorrhagic transformation or significant mass effect. Scattered confluent foci of T2 hyperintensity are seen within the white matter of the cerebral hemispheres, nonspecific, most likely related to chronic small vessel ischemia. Mild parenchymal volume loss. Vascular: Susceptibility artifact is seen in the anterior left sylvian fissure and left parietal sulcus, most likely related to vessel thrombosis. Skull and upper cervical spine: Normal marrow signal. Sinuses/Orbits: Bilateral lens surgery. Paranasal sinuses are essentially clear. Bilateral mastoid effusion. Other: None. MRA HEAD FINDINGS Anterior circulation: Normal caliber and flow related enhancement of the bilateral intracranial ICAs. Focus of loss of flow related enhancement is seen in the left MCA bifurcation (series 3, image 95), may represent nonocclusive thrombus. Loss of flow related enhancement is also seen a proximal left M2/MCA posterior branch  with reconstitution of flow at the distal M2 segment and subsequent loss of flow at the M3 level. The right MCA vascular tree remains patent. The azygous ACA has normal caliber and flow related enhancement. Posterior circulation: Hypoplastic right vertebral artery supplying primarily the right PICA with minimal flow to the basilar artery. The dominant left vertebral artery has normal caliber flow related enhancement. Normal caliber and flow related enhancement of the basilar artery and right posterior cerebral artery. Diffusely small caliber of the P1 and proximal P2 segment of the fetal left PCA which remains patent. Anatomic variants: Hypoplastic left A1/ACA with azygous A2/ACA segment. Fetal left PCA. IMPRESSION: 1. Acute/subacute infarct in the posterior left MCA territory correlating with areas of hypodensity seen on recent head CT. No hemorrhagic transformation or significant mass effect. 2. Loss of flow related enhancement in the left MCA bifurcation may represent nonocclusive thrombus. 3. 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 4. Diffusely small caliber of the P1 and proximal P2 segment of the fetal left PCA which remains patent. 5. Moderate chronic microvascular ischemic changes of the white matter. Electronically Signed   By: Baldemar Lenis M.D.   On: 01/25/2023 13:59   DG Chest Portable 1 View  Result Date: 01/25/2023 CLINICAL DATA:  Altered mental status. EXAM: PORTABLE CHEST 1 VIEW COMPARISON:  January 10, 2023. FINDINGS: The heart size and mediastinal contours are within normal limits. Both lungs are clear. Moderately displaced distal left clavicular fracture is again noted. IMPRESSION: No acute cardiopulmonary abnormality seen. Moderately displaced distal left clavicular fracture. Electronically Signed   By: Lupita Raider M.D.   On: 01/25/2023 13:30   CT HEAD CODE STROKE WO CONTRAST  Result Date: 01/25/2023 CLINICAL DATA:  Code stroke. Neuro deficit, acute, stroke  suspected. Aphasia. EXAM: CT HEAD WITHOUT CONTRAST TECHNIQUE: Contiguous axial images were obtained from the base of the skull through the vertex without intravenous contrast. RADIATION DOSE REDUCTION: This exam was performed according to the departmental dose-optimization program which includes automated exposure control, adjustment of the mA and/or kV according to patient size and/or use of iterative reconstruction technique. COMPARISON:  Head CT 01/08/2023 FINDINGS: Brain: There are acute to early  subacute appearing infarcts in the left MCA territory involving the insula and cortex and white matter of the parietal lobe at the level of the operculum and corona radiata as well as more posterior superiorly in the parietal lobe. No intracranial hemorrhage, mass, midline shift, or extra-axial fluid collection is identified. Patchy hypodensities elsewhere in the cerebral white matter bilaterally are similar to the prior study and are nonspecific but compatible with chronic small vessel ischemic disease which is relatively mild for age. Mild cerebral atrophy is within normal limits for age. Vascular: Calcified atherosclerosis at the skull base. No hyperdense vessel. Skull: No acute fracture or suspicious osseous lesion. Sinuses/Orbits: Visualized paranasal sinuses are clear. Small right mastoid effusion. Bilateral cataract extraction. Other: None. ASPECTS [facility] Stroke Program Early CT Score) - Ganglionic level infarction (caudate, lentiform nuclei, internal capsule, insula, M1-M3 cortex): 5 - Supraganglionic infarction (M4-M6 cortex): 2 Total score (0-10 with 10 being normal): 7 These results were communicated to Dr. Selina Cooley at 11:02 am on 01/25/2023 by text page via the [facility] messaging system. 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 ASPECTS of 7. Electronically Signed   By: Sebastian Ache M.D.   On: 01/25/2023 11:03    Procedures .Critical Care  Performed by: Virgina Norfolk,  DO Authorized by: Virgina Norfolk, DO   Critical care provider statement:    Critical care time (minutes):  35   Critical care was necessary to treat or prevent imminent or life-threatening deterioration of the following conditions:  CNS failure or compromise   Critical care was time spent personally by me on the following activities:  Blood draw for specimens, discussions with consultants, discussions with primary provider, evaluation of patient's response to treatment, examination of patient, obtaining history from patient or surrogate, ordering and performing treatments and interventions, ordering and review of laboratory studies, ordering and review of radiographic studies, pulse oximetry, re-evaluation of patient's condition and review of old charts   I assumed direction of critical care for this patient from another provider in my specialty: no       Medications Ordered in ED Medications   stroke: early stages of recovery book (has no administration in time range)    ED Course/ Medical Decision Making/ A&P                                 Medical Decision Making Amount and/or Complexity of Data Reviewed Labs: ordered. Radiology: ordered.  Risk Decision regarding hospitalization.   Fahren Perel here as a code stroke.  Normal vitals.  No fever.  Recently admitted to the hospital for pneumonia now in rehab facility.  Question right-sided weakness, speech difficulty this morning at rehab but when we talk on the phone with family family has noticed that here the last few weeks while she was in the hospital.  Ultimately with neurology Dr. Selina Cooley we did not feel that we had a good last known normal and patient will not be a candidate for TNK.  Would probably even be outside the window for LVO.  At this time we will pursue medical workup and MRI to evaluate if she had a stroke.  There does appear to be some right-sided weakness compared to the left but she does have dementia, COPD on 2 L of  oxygen, CKD.  Overall vital signs are reassuring.  She does not look to be in any distress.  Will pursue workup to evaluate for electrolyte abnormality,  infectious process, stroke.  EKG shows sinus rhythm.  No ischemic changes.  CT report reviewed by radiology and neurology does show probably acute to subacute nonhemorrhagic left MCA infarct.  Neurology recommend admission for further stroke workup.  MRIs have been ordered.  Lab work per my review interpretation is unremarkable.  To be admitted to medicine for further care.  This chart was dictated using voice recognition software.  Despite best efforts to proofread,  errors can occur which can change the documentation meaning.      Final Clinical Impression(s) / ED Diagnoses Final diagnoses:  Cerebrovascular accident (CVA), unspecified mechanism (HCC)  Closed nondisplaced fracture of left clavicle, unspecified part of clavicle, initial encounter    Rx / DC Orders ED Discharge Orders     None         Virgina Norfolk, DO 01/25/23 1203    Virgina Norfolk, DO 01/25/23 1419

## 2023-01-25 NOTE — Evaluation (Signed)
Physical Therapy Evaluation Patient Details Name: Samantha Carpenter MRN: [id number] DOB: 25-Feb-1941 Today's Date: 01/25/2023  History of Present Illness  82 y.o. female presents to [facility] hospital on 01/25/2023 with R weakness and impaired speech. 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 PMH includes MDD, HLD, emphysema, HTN, CKD III, COPD, dementia.  Clinical Impression  Pt reports to Pt with deficits in strength, balance, mobility, and activity tolerance. The PT required  ModA-maxA+2 for bed mobility to assist with trunk and BLE control. Pt was unable to fully stand or clear bottom off of the bed due to generalized weakness in LE and cognition. Pt presented with expressive dysphasia and dementia leading to history being retrieved from past hospitalization. Patient will continue to benefit from skilled PT to address remaining functional deficits. PT recommends short term PT facility post discharge due to pt need for physical assistance for all mobility.      If plan is discharge home, recommend the following: A lot of help with walking and/or transfers;A lot of help with bathing/dressing/bathroom;Assist for transportation;Help with stairs or ramp for entrance;Assistance with cooking/housework;Direct supervision/assist for medications management;Direct supervision/assist for financial management;Supervision due to cognitive status   Can travel by private vehicle   No    Equipment Recommendations Other (comment)  Recommendations for Other Services       Functional Status Assessment Patient has had a recent decline in their functional status and demonstrates the ability to make significant improvements in function in a reasonable and predictable amount of time.     Precautions / Restrictions Precautions Precautions: Fall Precaution Comments: hx of dementia Restrictions Weight Bearing Restrictions: Yes LUE Weight Bearing: Non weight bearing (L clavicle fx)      Mobility  Bed  Mobility Overal bed mobility: Needs Assistance Bed Mobility: Supine to Sit, Sit to Supine     Supine to sit: HOB elevated, Mod assist Sit to supine: Max assist, +2 for physical assistance, HOB elevated   General bed mobility comments: Pt able to perform bed mobility modA-maxA x2 with frequent multimodal cueing and assist with trunk/BLE control; poor initiation of task and maintaining effort throughout mobility    Transfers Overall transfer level: Needs assistance Equipment used: 1 person hand held assist Transfers: Sit to/from Stand Sit to Stand: Total assist (Pt unable to clear bottom from bed.)           General transfer comment: Pt unable to follow sequencing and requires PT assist with task    Ambulation/Gait                  Stairs            Wheelchair Mobility     Tilt Bed    Modified Rankin (Stroke Patients Only)       Balance Overall balance assessment: Needs assistance Sitting-balance support: Feet supported, Single extremity supported Sitting balance-Leahy Scale: Poor Sitting balance - Comments: Pt able to maintain seated EOB with RUE support                                     Pertinent Vitals/Pain Pain Assessment Pain Assessment: No/denies pain    Home Living Family/patient expects to be discharged to:: Assisted living                 Home Equipment: [facility] Mor - single point Additional Comments: [facility] memory care ALF    Prior Function Prior  Level of Function : Patient poor historian/Family not available;History of Falls (last six months)             Mobility Comments: fall history; received history from staff at ALF, reports she mostly walks with a SPC with a staff member at all times d/t unsteadiness ADLs Comments: per ALF staff, pt mostly MOD I with grooming, feeding and assist for toileting and bathing with DME, Assist for IADLs     Extremity/Trunk Assessment   Upper Extremity  Assessment Upper Extremity Assessment: LUE deficits/detail;RUE deficits/detail;Difficult to assess due to impaired cognition RUE Deficits / Details: generalized weakness LUE Deficits / Details: clavicle fx LUE:  (NWB per doctor)    Lower Extremity Assessment Lower Extremity Assessment: Generalized weakness    Cervical / Trunk Assessment Cervical / Trunk Assessment: Kyphotic  Communication   Communication Communication: Difficulty following commands/understanding;Difficulty communicating thoughts/reduced clarity of speech Following commands: Follows one step commands inconsistently Cueing Techniques: Verbal cues;Tactile cues;Visual cues  Cognition Arousal: Alert Behavior During Therapy: Flat affect Overall Cognitive Status: History of cognitive impairments - at baseline Area of Impairment: Orientation, Attention, Memory, Following commands, Safety/judgement, Awareness, Problem solving                 Orientation Level: Disoriented to, Place, Time, Situation Current Attention Level: Focused Memory: Decreased short-term memory Following Commands: Follows one step commands with increased time Safety/Judgement: Decreased awareness of safety, Decreased awareness of deficits Awareness: Intellectual Problem Solving: Slow processing, Difficulty sequencing, Requires verbal cues, Requires tactile cues, Decreased initiation          General Comments General comments (skin integrity, edema, etc.): Pt history was retrieved from previous hospitalization due to pt difficulty with speech and dementia    Exercises     Assessment/Plan    PT Assessment Patient needs continued PT services  PT Problem List Decreased strength;Decreased range of motion;Decreased activity tolerance;Decreased balance;Decreased mobility;Decreased cognition       PT Treatment Interventions DME instruction;Gait training;Functional mobility training;Therapeutic exercise;Therapeutic activities;Balance  training;Cognitive remediation;Neuromuscular re-education;Patient/family education    PT Goals (Current goals can be found in the Care Plan section)  Acute Rehab PT Goals Patient Stated Goal: unable to state PT Goal Formulation: With patient Time For Goal Achievement: 02/08/23 Potential to Achieve Goals: Fair    Frequency Min 1X/week     Co-evaluation               AM-PAC PT "6 Clicks" Mobility  Outcome Measure Help needed turning from your back to your side while in a flat bed without using bedrails?: A Lot Help needed moving from lying on your back to sitting on the side of a flat bed without using bedrails?: A Lot Help needed moving to and from a bed to a chair (including a wheelchair)?: Total Help needed standing up from a chair using your arms (e.g., wheelchair or bedside chair)?: Total Help needed to walk in hospital room?: Total Help needed climbing 3-5 steps with a railing? : Total 6 Click Score: 8    End of Session Equipment Utilized During Treatment: Gait belt Activity Tolerance: Treatment limited secondary to medical complications (Comment) Patient left: in bed;with chair alarm set;with call bell/phone within reach Nurse Communication: Mobility status PT Visit Diagnosis: Muscle weakness (generalized) (M62.81);Unsteadiness on feet (R26.81)    Time: 4098-1191 PT Time Calculation (min) (ACUTE ONLY): 18 min   Charges:   PT Evaluation $PT Eval Low Complexity: 1 Low   PT General Charges $$ ACUTE PT VISIT: 1 Visit  Caryl Comes, SPT Acute Rehabilitation Office Phone [phone]  Caryl Comes 01/25/2023, 3:57 PM

## 2023-01-25 NOTE — Code Documentation (Signed)
Stroke Response Nurse Documentation Code Documentation  Samantha Carpenter is a 82 y.o. female arriving to [facility]  via [facility] on 11/8 with past medical hx of dementia, htn, CKD, prediabetes. On No antithrombotic. Code stroke was activated by EMS.   Patient from [facility] in [name] where she arrived yesterday after admission to [name]. Facility states that LKW was 1000 and at that time developed generalized weakness, inability to follow commands, and aphasia. In discussion with the family, they state that the patient has been just like this for two weeks.    Stroke team at the bedside on patient arrival. Labs drawn and patient cleared for CT by Dr. Lockie Mola. Patient to CT with team. NIHSS 20, see documentation for details and code stroke times. Patient with disoriented, not following commands, right arm weakness, bilateral leg weakness, bilateral decreased sensation, Global aphasia , and dysarthria  on exam. The following imaging was completed:  CT Head. Patient is not a candidate for IV Thrombolytic due to being out of the treatment window. Patient is not a candidate for IR due to LKW being two weeks ago.   Care Plan: q2 NIHSS, MRI, EEG.   Bedside handoff with ED RN Osvaldo Shipper.    Samantha Carpenter  Stroke Response RN

## 2023-01-25 NOTE — ED Notes (Signed)
Pt found on edge of bed attempting to get out.

## 2023-01-25 NOTE — ED Notes (Signed)
EEG at bedside.

## 2023-01-25 NOTE — ED Notes (Signed)
Pt back form MRI 

## 2023-01-25 NOTE — ED Notes (Signed)
Pt unable to answer admission questions at this time.

## 2023-01-26 ENCOUNTER — Ambulatory Visit (HOSPITAL_COMMUNITY): Payer: Medicare HMO

## 2023-01-26 ENCOUNTER — Observation Stay (HOSPITAL_COMMUNITY): Payer: Medicare HMO

## 2023-01-26 DIAGNOSIS — D539 Nutritional anemia, unspecified: Secondary | ICD-10-CM | POA: Diagnosis present

## 2023-01-26 DIAGNOSIS — Z66 Do not resuscitate: Secondary | ICD-10-CM | POA: Diagnosis present

## 2023-01-26 DIAGNOSIS — R29721 NIHSS score 21: Secondary | ICD-10-CM | POA: Diagnosis present

## 2023-01-26 DIAGNOSIS — Z1152 Encounter for screening for COVID-19: Secondary | ICD-10-CM | POA: Diagnosis not present

## 2023-01-26 DIAGNOSIS — J441 Chronic obstructive pulmonary disease with (acute) exacerbation: Secondary | ICD-10-CM | POA: Diagnosis present

## 2023-01-26 DIAGNOSIS — J9611 Chronic respiratory failure with hypoxia: Secondary | ICD-10-CM | POA: Diagnosis present

## 2023-01-26 DIAGNOSIS — R29718 NIHSS score 18: Secondary | ICD-10-CM | POA: Diagnosis not present

## 2023-01-26 DIAGNOSIS — R29719 NIHSS score 19: Secondary | ICD-10-CM | POA: Diagnosis not present

## 2023-01-26 DIAGNOSIS — I130 Hypertensive heart and chronic kidney disease with heart failure and stage 1 through stage 4 chronic kidney disease, or unspecified chronic kidney disease: Secondary | ICD-10-CM | POA: Diagnosis present

## 2023-01-26 DIAGNOSIS — J189 Pneumonia, unspecified organism: Secondary | ICD-10-CM | POA: Diagnosis present

## 2023-01-26 DIAGNOSIS — W19XXXA Unspecified fall, initial encounter: Secondary | ICD-10-CM | POA: Diagnosis present

## 2023-01-26 DIAGNOSIS — J440 Chronic obstructive pulmonary disease with acute lower respiratory infection: Secondary | ICD-10-CM | POA: Diagnosis present

## 2023-01-26 DIAGNOSIS — I63512 Cerebral infarction due to unspecified occlusion or stenosis of left middle cerebral artery: Secondary | ICD-10-CM | POA: Diagnosis present

## 2023-01-26 DIAGNOSIS — S42032A Displaced fracture of lateral end of left clavicle, initial encounter for closed fracture: Secondary | ICD-10-CM | POA: Diagnosis present

## 2023-01-26 DIAGNOSIS — Z515 Encounter for palliative care: Secondary | ICD-10-CM | POA: Diagnosis not present

## 2023-01-26 DIAGNOSIS — R1310 Dysphagia, unspecified: Secondary | ICD-10-CM | POA: Diagnosis present

## 2023-01-26 DIAGNOSIS — E785 Hyperlipidemia, unspecified: Secondary | ICD-10-CM | POA: Diagnosis present

## 2023-01-26 DIAGNOSIS — F03918 Unspecified dementia, unspecified severity, with other behavioral disturbance: Secondary | ICD-10-CM | POA: Diagnosis present

## 2023-01-26 DIAGNOSIS — E870 Hyperosmolality and hypernatremia: Secondary | ICD-10-CM | POA: Diagnosis present

## 2023-01-26 DIAGNOSIS — I5032 Chronic diastolic (congestive) heart failure: Secondary | ICD-10-CM | POA: Diagnosis present

## 2023-01-26 DIAGNOSIS — Z7189 Other specified counseling: Secondary | ICD-10-CM | POA: Diagnosis not present

## 2023-01-26 DIAGNOSIS — F32A Depression, unspecified: Secondary | ICD-10-CM | POA: Diagnosis present

## 2023-01-26 DIAGNOSIS — G9341 Metabolic encephalopathy: Secondary | ICD-10-CM | POA: Diagnosis present

## 2023-01-26 DIAGNOSIS — N1831 Chronic kidney disease, stage 3a: Secondary | ICD-10-CM | POA: Diagnosis present

## 2023-01-26 DIAGNOSIS — I639 Cerebral infarction, unspecified: Secondary | ICD-10-CM | POA: Diagnosis not present

## 2023-01-26 DIAGNOSIS — G8191 Hemiplegia, unspecified affecting right dominant side: Secondary | ICD-10-CM | POA: Diagnosis present

## 2023-01-26 DIAGNOSIS — A419 Sepsis, unspecified organism: Secondary | ICD-10-CM | POA: Diagnosis present

## 2023-01-26 LAB — CBC
HCT: 34.5 % — ABNORMAL LOW (ref 36.0–46.0)
Hemoglobin: 11.1 g/dL — ABNORMAL LOW (ref 12.0–15.0)
MCH: 31.5 pg (ref 26.0–34.0)
MCHC: 32.2 g/dL (ref 30.0–36.0)
MCV: 98 fL (ref 80.0–100.0)
Platelets: 356 10*3/uL (ref 150–400)
RBC: 3.52 MIL/uL — ABNORMAL LOW (ref 3.87–5.11)
RDW: 13.6 % (ref 11.5–15.5)
WBC: 10.2 10*3/uL (ref 4.0–10.5)
nRBC: 0 % (ref 0.0–0.2)

## 2023-01-26 LAB — URINALYSIS, ROUTINE W REFLEX MICROSCOPIC
Bilirubin Urine: NEGATIVE
Glucose, UA: NEGATIVE mg/dL
Hgb urine dipstick: NEGATIVE
Ketones, ur: 5 mg/dL — AB
Nitrite: NEGATIVE
Protein, ur: 30 mg/dL — AB
Specific Gravity, Urine: 1.026 (ref 1.005–1.030)
pH: 5 (ref 5.0–8.0)

## 2023-01-26 LAB — GLUCOSE, CAPILLARY
Glucose-Capillary: 187 mg/dL — ABNORMAL HIGH (ref 70–99)
Glucose-Capillary: 144 mg/dL — ABNORMAL HIGH (ref 70–99)
Glucose-Capillary: 113 mg/dL — ABNORMAL HIGH (ref 70–99)

## 2023-01-26 LAB — RAPID URINE DRUG SCREEN, HOSP PERFORMED
Amphetamines: NOT DETECTED
Barbiturates: NOT DETECTED
Benzodiazepines: NOT DETECTED
Cocaine: NOT DETECTED
Opiates: NOT DETECTED
Tetrahydrocannabinol: NOT DETECTED

## 2023-01-26 LAB — HEMOGLOBIN A1C
Hgb A1c MFr Bld: 6.6 % — ABNORMAL HIGH (ref 4.8–5.6)
Mean Plasma Glucose: 142.72 mg/dL

## 2023-01-26 LAB — LIPID PANEL
Cholesterol: 172 mg/dL (ref 0–200)
HDL: 66 mg/dL (ref >40)
LDL Cholesterol: 78 mg/dL (ref 0–99)
Total CHOL/HDL Ratio: 2.6 {ratio}
Triglycerides: 138 mg/dL (ref <150)
VLDL: 28 mg/dL (ref 0–40)

## 2023-01-26 LAB — BASIC METABOLIC PANEL
Anion gap: 13 (ref 5–15)
BUN: 29 mg/dL — ABNORMAL HIGH (ref 8–23)
CO2: 30 mmol/L (ref 22–32)
Calcium: 9.3 mg/dL (ref 8.9–10.3)
Chloride: 103 mmol/L (ref 98–111)
Creatinine, Ser: 1.41 mg/dL — ABNORMAL HIGH (ref 0.44–1.00)
GFR, Estimated: 37 mL/min — ABNORMAL LOW (ref >60)
Glucose, Bld: 138 mg/dL — ABNORMAL HIGH (ref 70–99)
Potassium: 3.5 mmol/L (ref 3.5–5.1)
Sodium: 146 mmol/L — ABNORMAL HIGH (ref 135–145)

## 2023-01-26 MED ORDER — TRAMADOL HCL 50 MG PO TABS: 50.0000 mg | ORAL_TABLET | Freq: Four times a day (QID) | ORAL | Status: DC | PRN

## 2023-01-26 MED ORDER — METHYLPREDNISOLONE 4 MG PO TBPK: 4.0000 mg | ORAL_TABLET | ORAL | Status: DC

## 2023-01-26 MED ORDER — DEXTROSE 5 % IV SOLN: INTRAVENOUS | Status: AC

## 2023-01-26 MED ORDER — METHYLPREDNISOLONE 4 MG PO TBPK
8.0000 mg | ORAL_TABLET | Freq: Every morning | ORAL | Status: AC
  Administered 2023-01-26: 8 mg via ORAL
  Filled 2023-01-26: qty 21
  Filled 2023-01-26: qty 2

## 2023-01-26 MED ORDER — METHYLPREDNISOLONE 4 MG PO TBPK: 4.0000 mg | ORAL_TABLET | Freq: Four times a day (QID) | ORAL | Status: DC

## 2023-01-26 MED ORDER — METHYLPREDNISOLONE 4 MG PO TBPK: 8.0000 mg | ORAL_TABLET | Freq: Every evening | ORAL | Status: DC

## 2023-01-26 MED ORDER — ASPIRIN 300 MG RE SUPP
300.0000 mg | Freq: Every day | RECTAL | Status: DC
  Filled 2023-01-26: qty 1

## 2023-01-26 MED ORDER — HYDRALAZINE HCL 20 MG/ML IJ SOLN
10.0000 mg | Freq: Four times a day (QID) | INTRAMUSCULAR | Status: DC | PRN
  Administered 2023-01-26 – 2023-01-29 (×6): 10 mg via INTRAVENOUS
  Filled 2023-01-26 (×6): qty 1

## 2023-01-26 MED ORDER — LOSARTAN POTASSIUM 50 MG PO TABS: 50.0000 mg | ORAL_TABLET | Freq: Every day | ORAL | Status: DC

## 2023-01-26 MED ORDER — INSULIN ASPART 100 UNIT/ML IJ SOLN
0.0000 [IU] | Freq: Three times a day (TID) | INTRAMUSCULAR | Status: DC
  Administered 2023-01-26: 1 [IU] via SUBCUTANEOUS
  Administered 2023-01-26 – 2023-01-27 (×3): 2 [IU] via SUBCUTANEOUS
  Administered 2023-01-28: 1 [IU] via SUBCUTANEOUS
  Administered 2023-01-28: 2 [IU] via SUBCUTANEOUS
  Administered 2023-01-28: 1 [IU] via SUBCUTANEOUS
  Administered 2023-01-29: 2 [IU] via SUBCUTANEOUS

## 2023-01-26 MED ORDER — BISOPROLOL FUMARATE 5 MG PO TABS
5.0000 mg | ORAL_TABLET | Freq: Every day | ORAL | Status: DC
  Filled 2023-01-26 (×3): qty 1

## 2023-01-26 MED ORDER — BISOPROLOL FUMARATE 10 MG PO TABS
10.0000 mg | ORAL_TABLET | Freq: Every day | ORAL | Status: DC
  Administered 2023-01-26: 10 mg via ORAL
  Filled 2023-01-26: qty 1

## 2023-01-26 MED ORDER — ASPIRIN 81 MG PO CHEW: 81.0000 mg | CHEWABLE_TABLET | Freq: Every day | ORAL | Status: DC

## 2023-01-26 MED ORDER — ROSUVASTATIN CALCIUM 5 MG PO TABS
5.0000 mg | ORAL_TABLET | Freq: Every day | ORAL | Status: DC
  Administered 2023-01-26: 5 mg via ORAL
  Filled 2023-01-26: qty 1

## 2023-01-26 MED ORDER — INSULIN ASPART 100 UNIT/ML IJ SOLN: 0.0000 [IU] | Freq: Every day | INTRAMUSCULAR | Status: DC

## 2023-01-26 MED ORDER — METHYLPREDNISOLONE 4 MG PO TBPK: 4.0000 mg | ORAL_TABLET | Freq: Three times a day (TID) | ORAL | Status: DC

## 2023-01-26 MED ORDER — SODIUM CHLORIDE 0.9 % IV SOLN
3.0000 g | Freq: Two times a day (BID) | INTRAVENOUS | Status: DC
  Administered 2023-01-26 – 2023-01-29 (×6): 3 g via INTRAVENOUS
  Filled 2023-01-26 (×6): qty 8

## 2023-01-26 NOTE — Evaluation (Addendum)
Occupational Therapy Evaluation Patient Details Name: Samantha Carpenter MRN: [id number] DOB: [birth year] Today's Date: 01/26/2023   History of Present Illness 82 y.o. female presents to [facility] hospital on 01/25/2023 with R weakness and impaired speech. Pt with L clavicle fx NWB for LUE.  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 PMH includes MDD, HLD, emphysema, HTN, CKD III, COPD, dementia.   Clinical Impression   Pt s/p above diagnosis. Pt minimally able to participate during session, difficulty with verbal responses or following commands. Pt at baseline lives at assisted living, grossly independent PLOF. Pt currently requires total A for ADLs, was not able to participate activity, little to no functional grip during session, not able to follow verbal commands or reply to questions. Pt at times responded yes/no to random questions inconsistently. At this time recommending postacute rehab <3hrs/day, will continue to follow acutely to progress as able.        If plan is discharge home, recommend the following: Two people to help with walking and/or transfers;Two people to help with bathing/dressing/bathroom;Assistance with feeding;Direct supervision/assist for medications management;Help with stairs or ramp for entrance    Functional Status Assessment  Patient has had a recent decline in their functional status and demonstrates the ability to make significant improvements in function in a reasonable and predictable amount of time.  Equipment Recommendations  None recommended by OT    Recommendations for Other Services       Precautions / Restrictions Precautions Precautions: Fall Precaution Comments: hx of dementia Restrictions Weight Bearing Restrictions: Yes LUE Weight Bearing: Non weight bearing (L clavicle fx)      Mobility Bed Mobility Overal bed mobility: Needs Assistance Bed Mobility: Supine to Sit, Sit to Supine       Sit to supine: Max assist, HOB elevated, +2 for  physical assistance   General bed mobility comments: Pt only able to assist with some trunk control, not able to initiate movement or follow commands.    Transfers Overall transfer level: Needs assistance   Transfers: Sit to/from Stand Sit to Stand: Total assist           General transfer comment: unable      Balance Overall balance assessment: Needs assistance Sitting-balance support: Feet supported, Single extremity supported Sitting balance-Leahy Scale: Poor       Standing balance-Leahy Scale: Zero                             ADL either performed or assessed with clinical judgement   ADL Overall ADL's : Needs assistance/impaired                                       General ADL Comments: Pt at this time total A for all ADLs, was not able to actively participate in eval     Vision         Perception         Praxis         Pertinent Vitals/Pain Pain Assessment Pain Assessment: Faces Faces Pain Scale: Hurts a little bit Pain Location: unable to verbalize where Pain Descriptors / Indicators: Discomfort, Grimacing Pain Intervention(s): Monitored during session     Extremity/Trunk Assessment Upper Extremity Assessment Upper Extremity Assessment: Difficult to assess due to impaired cognition;RUE deficits/detail;LUE deficits/detail RUE Deficits / Details: flaccid, no voluntary movement RUE: Shoulder pain with ROM LUE  Deficits / Details: clavicle fx, NWB, limited AROM, no functional grip, difficult to assess due to decreased cognition LUE: Shoulder pain with ROM   Lower Extremity Assessment Lower Extremity Assessment: Defer to PT evaluation       Communication Communication Communication: Difficulty following commands/understanding Following commands: Follows one step commands inconsistently Cueing Techniques: Verbal cues;Tactile cues   Cognition Arousal: Alert Behavior During Therapy: Flat affect Overall Cognitive  Status: History of cognitive impairments - at baseline Area of Impairment: Orientation, Attention, Memory, Following commands, Safety/judgement, Awareness, Problem solving                 Orientation Level: Disoriented to, Place, Time, Situation Current Attention Level: Focused Memory: Decreased short-term memory Following Commands: Follows one step commands with increased time Safety/Judgement: Decreased awareness of safety, Decreased awareness of deficits Awareness: Intellectual Problem Solving: Slow processing, Difficulty sequencing, Requires verbal cues, Requires tactile cues, Decreased initiation General Comments: Pt unable to respond to any questions, not able to follow commands, was not able to actively participate in session. Pt occassionally responded to comments but inconsistently.     General Comments       Exercises     Shoulder Instructions      Home Living Family/patient expects to be discharged to:: Assisted living                             Home Equipment: [facility] - single point   Additional Comments: [facility] memory care [facility]F      Prior Functioning/Environment Prior Level of Function : Patient poor historian/Family not available;History of Falls (last six months)             Mobility Comments: fall history; received history from staff at ALF, reports she mostly walks with a SPC with a staff member at all times d/t unsteadiness ADLs Comments: per ALF staff, pt mostly MOD I with grooming, feeding and assist for toileting and bathing with DME, Assist for IADLs        OT Problem List: Decreased strength;Decreased range of motion;Decreased activity tolerance;Impaired balance (sitting and/or standing);Decreased coordination;Decreased cognition;Decreased safety awareness;Impaired UE functional use;Pain      OT Treatment/Interventions: Self-care/ADL training;Therapeutic exercise;Neuromuscular education;Energy conservation;DME and/or AE  instruction;Manual therapy;Therapeutic activities;Patient/family education    OT Goals(Current goals can be found in the care plan section) Acute Rehab OT Goals Patient Stated Goal: not able to participate in goal setting OT Goal Formulation: With patient Time For Goal Achievement: 02/09/23 Potential to Achieve Goals: Fair  OT Frequency: Min 1X/week    Co-evaluation              AM-PAC OT "6 Clicks" Daily Activity     Outcome Measure Help from another person eating meals?: Total Help from another person taking care of personal grooming?: Total Help from another person toileting, which includes using toliet, bedpan, or urinal?: Total Help from another person bathing (including washing, rinsing, drying)?: Total Help from another person to put on and taking off regular upper body clothing?: Total Help from another person to put on and taking off regular lower body clothing?: Total 6 Click Score: 6   End of Session Nurse Communication: Mobility status  Activity Tolerance: Other (comment) (minimal participation due to decreased cognition) Patient left: in bed;with bed alarm set;with call bell/phone within reach  OT Visit Diagnosis: Unsteadiness on feet (R26.81);Other abnormalities of gait and mobility (R26.89);Muscle weakness (generalized) (M62.81);History of falling (Z91.81);Hemiplegia and hemiparesis;Pain Hemiplegia -  Right/Left: Right Pain - Right/Left: Left Pain - part of body: Shoulder                Time: 8657-8469 OT Time Calculation (min): 14 min Charges:  OT General Charges $OT Visit: 1 Visit OT Evaluation $OT Eval Moderate Complexity: 1 [address], OTR/L   Alexis Goodell 01/26/2023, 1:40 PM

## 2023-01-26 NOTE — Evaluation (Signed)
Clinical/Bedside Swallow Evaluation Patient Details  Name: Samantha Carpenter MRN: [id number] Date of Birth: 09-Feb-1941  Today's Date: 01/26/2023 Time: SLP Start Time (ACUTE ONLY): 1008 SLP Stop Time (ACUTE ONLY): 1031 SLP Time Calculation (min) (ACUTE ONLY): 23 min  Past Medical History:  Past Medical History:  Diagnosis Date   Chronic kidney disease    COPD (chronic obstructive pulmonary disease) (HCC)    Dementia (HCC)    Depression    Hypertension    Liver disease    Past Surgical History:  Past Surgical History:  Procedure Laterality Date   ABDOMINAL HYSTERECTOMY     CHOLECYSTECTOMY     ESOPHAGOGASTRODUODENOSCOPY N/A 11/20/2019   Procedure: ESOPHAGOGASTRODUODENOSCOPY (EGD);  Surgeon: Regis Bill, MD;  Location: [facility] ENDOSCOPY;  Service: Gastroenterology;  Laterality: N/A;   face tuck     HPI:  HPI: Samantha Carpenter is a 82 y.o. female with medical history significant of hypertension, hyperlipidemia, COPD, chronic diastolic congestive heart failure, CKD stage IIIa, dementia, and frequent falls who presented with complaints of difficulty speaking and right-sided weakness.  At baseline she could talk, walk without assistance although recommended to use walk, and care for herself.  She had just recently been hospitalized from 10/22-11/7 with acute hypoxic and hypercapnic respiratory failure thought secondary to COPD exacerbation and community acquired pneumonia.  There was concern for acute on chronic diastolic heart failure and patient was temporarily treated with Lasix.  Prior to the patient being transferred to the rehab facility her family had question if she had had a stroke because she seemed drastically different from baseline and had been having difficulty feeding herself which was unusual.  They had checked a CT scan of her head which was noted to be negative.  After she got to the facility due to patient having significant difficulty speaking and weakness on her right side sent  her to the hospital for further evaluation due to concern for stroke.     In the emergency department patient was seen as a code stroke.  Noted to be febrile up to 101.1 F with tachypnea.  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  Patient was noted not to be a thrombolytic candidate as unclear time of onset pressures elevated up to 189/58 all other vital signs maintained.  Labs significant for WBC 8.6, hemoglobin 10.6, sodium 146, BUN 29, creatinine 1.36, glucose 167.  Chest x-ray noted no acute cardiopulmonary abnormality but a moderately displaced distal left clavicle fracture. MRI of the brain significant for acute or subacute infarct in the posterior left MCA territory correlating with CT imaging.    Assessment / Plan / Recommendation  Clinical Impression   Pt presents with at least a moderate oral dysphagia and concerns for a pharyngeal dysphagia per clinical swallow assessment completed today.   Pt presented with open mouth resting posture, slight lingual protrusion and rapid lingual and labial vesiculations vs spasms. She did not appear to have control over repetitive lingual and labial movements. She was unable to complete oral mech exam.  She is edentulous with no dentures at bedside.   Oral deficits consisted of poor labial seal resulting in reduced labial stripping and reduced oral control with some anterior spillage of liquids and applesauce from midline. Poor oral transit observed of applesauce x1 instance; improved with placement of applesauce towards back of mouth. Oral transit remained prolonged, though no significant oral residue observed post swallow. Pharyngeal swallow initiation appeared delayed to palpation, sometimes requiring verbal  cues to  initiate swallow with applesauce specifically. Laryngeal elevation noted. Observed immediate coughing with expectoration of thin liquids by straw x1 instance. No overt or subtle s/s of aspiration observed with ice  chip, thin liquids by small  cup sip, and applesauce.   At this time, a safe oral diet cannot be recommended at bedside. Recommend continue NPO with the exception of ice chips for oral comfort and meds crushed in applesauce. Recommend placement of spoon towards back of oral cavity and verbal cues to "swallow fast". RN updated.   Plan: SLP will follow for clinical swallow re-assessment and instrumental swallow assessment in the next 1-2 days. Continue SLP PoC.   SLP Visit Diagnosis: Dysphagia, unspecified (R13.10)    Aspiration Risk  Moderate aspiration risk    Diet Recommendation NPO;Ice chips PRN after oral care    Medication Administration: Crushed with puree Supervision: Staff to assist with self feeding Compensations: Minimize environmental distractions;Slow rate;Small sips/bites;Follow solids with liquid (place spoon towards back of mouth to aid oral transit of meds in applesauce or ice chips)    Other  Recommendations Oral Care Recommendations: Oral care BID    Recommendations for follow up therapy are one component of a multi-disciplinary discharge planning process, led by the attending physician.  Recommendations may be updated based on patient status, additional functional criteria and insurance authorization.  Follow up Recommendations Skilled nursing-short term rehab (<3 hours/day)      Assistance Recommended at Discharge  Full supervision   Functional Status Assessment Patient has had a recent decline in their functional status and demonstrates the ability to make significant improvements in function in a reasonable and predictable amount of time.  Frequency and Duration min 1 x/week  1 week       Prognosis Prognosis for improved oropharyngeal function: Fair Barriers to Reach Goals: Cognitive deficits;Language deficits      Swallow Study   General Date of Onset: 01/25/23 HPI: HPI: Samantha Carpenter is a 82 y.o. female with medical history significant of hypertension,  hyperlipidemia, COPD, chronic diastolic congestive heart failure, CKD stage IIIa, dementia, and frequent falls who presented with complaints of difficulty speaking and right-sided weakness.  At baseline she could talk, walk without assistance although recommended to use walk, and care for herself.  She had just recently been hospitalized from 10/22-11/7 with acute hypoxic and hypercapnic respiratory failure thought secondary to COPD exacerbation and community acquired pneumonia.  There was concern for acute on chronic diastolic heart failure and patient was temporarily treated with Lasix.  Prior to the patient being transferred to the rehab facility her family had question if she had had a stroke because she seemed drastically different from baseline and had been having difficulty feeding herself which was unusual.  They had checked a CT scan of her head which was noted to be negative.  After she got to the facility due to patient having significant difficulty speaking and weakness on her right side sent her to the hospital for further evaluation due to concern for stroke.     In the emergency department patient was seen as a code stroke.  Noted to be febrile up to 101.1 F with tachypnea.  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  Patient was noted not to be a thrombolytic candidate as unclear time of onset pressures elevated up to 189/58 all other vital signs maintained.  Labs significant for WBC 8.6, hemoglobin 10.6, sodium 146, BUN 29, creatinine 1.36, glucose 167.  Chest x-ray noted no  acute cardiopulmonary abnormality but a moderately displaced distal left clavicle fracture. MRI of the brain significant for acute or subacute infarct in the posterior left MCA territory correlating with CT imaging. Type of Study: Bedside Swallow Evaluation Previous Swallow Assessment: CSE at [facility] on 01/19/23 with recommendation of Dys 1 diet and thin liquids Diet Prior to this Study:  NPO Temperature Spikes Noted: No Respiratory Status: Nasal cannula (2L) History of Recent Intubation: No Behavior/Cognition: Alert;Requires cueing;Confused Oral Cavity Assessment: Dried secretions Oral Care Completed by SLP: No Oral Cavity - Dentition: Edentulous Vision:  (unable to assess) Self-Feeding Abilities: Total assist Patient Positioning: Upright in bed Baseline Vocal Quality:  (limited assessment, clear voice observed during spontaneous response) Volitional Cough: Cognitively unable to elicit Volitional Swallow: Unable to elicit    Oral/Motor/Sensory Function Overall Oral Motor/Sensory Function: Moderate impairment (please see note for details)   Ice Chips Ice chips: Impaired Oral Phase Impairments: Reduced lingual movement/coordination;Reduced labial seal;Impaired mastication;Poor awareness of bolus Oral Phase Functional Implications: Oral residue;Prolonged oral transit   Thin Liquid Thin Liquid: Impaired Presentation: Cup;Spoon;Straw Oral Phase Impairments: Reduced lingual movement/coordination;Reduced labial seal;Poor awareness of bolus Oral Phase Functional Implications:  (anterior spillage at midline) Pharyngeal  Phase Impairments: Cough - Delayed;Cough - Immediate    Nectar Thick Nectar Thick Liquid: Not tested   Honey Thick Honey Thick Liquid: Not tested   Puree Puree: Impaired Presentation: Spoon Oral Phase Impairments: Poor awareness of bolus;Reduced lingual movement/coordination;Reduced labial seal Oral Phase Functional Implications: Oral residue;Prolonged oral transit (anterior spillage at midline) Pharyngeal Phase Impairments: Suspected delayed Swallow   Solid     Solid: Not tested      Ellery Plunk 01/26/2023,11:03 AM

## 2023-01-26 NOTE — Progress Notes (Signed)
PROGRESS NOTE                                                                                                                                                                                                             Patient Demographics:    Samantha Carpenter, is a 82 y.o. female, DOB - 1941-02-03, ZOX:[id number]  Outpatient Primary MD for the patient is Pcp, No    LOS - 0  Admit date - 01/25/2023    Chief Complaint  Patient presents with   Code Stroke       Brief Narrative (HPI from H&P)   82 y.o. female with medical history significant of hypertension, hyperlipidemia, COPD, chronic diastolic congestive heart failure, CKD stage IIIa, dementia, and frequent falls who presented with complaints of difficulty speaking and right-sided weakness.  At baseline she could talk, walk without assistance although recommended to use walk, and care for herself.  She had just recently been hospitalized from 10/22-11/7 with acute hypoxic and hypercapnic respiratory failure thought secondary to COPD exacerbation and community acquired pneumonia.   She now presented to the hospital with high fevers, right-sided weakness, left-sided clavicular fracture, was diagnosed with pneumonia along with acute left MCA stroke.  She also developed severe encephalopathy and was admitted.   Subjective:    Samantha Carpenter today in bed, thoroughly confused unable to provide any history or answer any questions, cannot follow commands   Assessment  & Plan :   Stroke  Subacute.  Patient reportedly had been having difficulty with speech and right-sided weakness.  Symptoms had possibly been present prior to the patient going to rehab yesterday. 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  Seen by stroke team, stroke workup per neurology, currently on aspirin, will place on low-dose statin for better LDL control, carotid ultrasound noted but patient is not  an operative candidate, extremely weak and frail with dementia and severe delirium now.  Continue to monitor with supportive care will defer to stroke team for any treatment plan changes with stroke.   Sepsis due to pneumonia present on admission.  High suspicion for aspiration pneumonia due to new stroke, will switch to Unasyn for antibiotics, speech therapy following, aspiration precautions..   Severe metabolic encephalopathy with underlying dementia.  Supportive care.  Minimize narcotics and benzodiazepines.  Involve palliative care for long-term goals of  care.  EEG nonacute.  COPD exacerbation Acute.  Patient with wheezing noted on physical exam. -DuoNebs 4 times daily and albuterol nebs as needed -Continue pharmacy substitution of Dulera for Advair Add short course of steroid taper.   Clavicle fracture secondary to fall Bilateral knee contusions Suspect prior to arrival.  On exam patient with bruising of the bilateral knees thought secondary to falls.  left arm placed in sling outpatient orthopedic follow-up, not wait bearing in left arm.   Essential hypertension Stroke likely more than 2 doses old per stroke team, gentle titration of blood pressure medications.   Chronic diastolic congestive heart failure EF 60% Chronic.  Patient does not appear  fluid overloaded at this time.  Continue to monitor for any diuretic needs.   Chronic kidney disease stage IIIb Creatinine noted to be 1.36 with BUN 29.  Creatinine appears near patient's baseline. -Continue to monitor kidney function.   Macrocytic anemia Chronic.  Hemoglobin 10.6 with MCV 100.6.  Hemoglobin appears stable. -Continue to monitor    Dementia -Delirium precautions -Set bed alarm on  Prediabetes On admission glucose 172.  Last available hemoglobin A1c was 6.1.  Patient is not on any medications for treatment. -Follow-up hemoglobin A1c will add ISS as she will be on a short course of steroids for COPD exacerbation.    CBG (last 3)  Recent Labs    01/25/23 1040  GLUCAP 145*            Condition - Extremely Guarded  Family Communication  :    Called sister Carney Bern [phone]  -voicemail not set up, 01/26/2023 at 8:30 AM.  Called niece Selena Batten [phone], voicemail not set up 01/26/2023 at 8:31 AM  Code Status : DNR  Consults  : Neurology, Pall care  PUD Prophylaxis :    Procedures  :     EEG.  Nonacute.    Echocardiogram.  1. Left ventricular ejection fraction, by estimation, is 70 to 75%. The left ventricle has hyperdynamic function. The left ventricle has no regional wall motion abnormalities. Left ventricular diastolic function could not be evaluated.  2. Right ventricular systolic function is normal. The right ventricular size is normal.  3. The mitral valve is grossly normal. No evidence of mitral valve regurgitation.  4. The aortic valve has an indeterminant number of cusps. Aortic valve regurgitation is not visualized. No aortic stenosis is present.  5. The inferior vena cava is normal in size with greater than 50% respiratory variability, suggesting right atrial pressure of 3 mmHg. Comparison(s): The left ventricular function is unchanged.   Vascular duplex.    Right Carotid: Velocities in the right ICA are consistent with a 1-39% stenosis. Non-hemodynamically significant plaque <50% noted in the CCA. The ECA appears >50% stenosed.   Left Carotid: Velocities in the left ICA are consistent with a 1-39% stenosis. The ECA appears <50% stenosed. Vertebrals:  Bilateral vertebral arteries demonstrate antegrade flow. Subclavians: Normal flow hemodynamics were seen in bilateral subclavian arteries.  MRI -  1. Acute/subacute infarct in the posterior left MCA territory correlating with areas of hypodensity seen on recent head CT. No hemorrhagic transformation or significant mass effect. 2. Loss of flow related enhancement in the left MCA bifurcation may represent nonocclusive thrombus. 3. Occlusion a  proximal left M2/MCA and M3 segments. 4. Diffusely small caliber of the P1 and proximal P2 segment of the fetal left PCA which remains patent. 5. Moderate chronic microvascular ischemic changes of the white matter.      Disposition  Plan  :    Status is: Observation  DVT Prophylaxis  :    enoxaparin (LOVENOX) injection 40 mg Start: 01/25/23 1900   Lab Results  Component Value Date   PLT 356 01/26/2023    Diet :  Diet Order             Diet NPO time specified  Diet effective now                    Inpatient Medications  Scheduled Meds:   stroke: early stages of recovery book   Does not apply Once   aspirin  300 mg Rectal Daily   bisoprolol  5 mg Oral Daily   DULoxetine  60 mg Oral Daily   enoxaparin (LOVENOX) injection  40 mg Subcutaneous Q24H   gabapentin  100 mg Oral BID   mometasone-formoterol  2 puff Inhalation BID   OLANZapine  2.5 mg Oral BID   sodium chloride flush  3 mL Intravenous Q12H   Continuous Infusions:  azithromycin     cefTRIAXone (ROCEPHIN)  IV 2 g (01/25/23 1853)   dextrose 75 mL/hr at 01/26/23 0635   PRN Meds:.acetaminophen **OR** acetaminophen, albuterol, hydrALAZINE, [DISCONTINUED] ondansetron **OR** ondansetron (ZOFRAN) IV  Antibiotics  :    Anti-infectives (From admission, onward)    Start     Dose/Rate Route Frequency Ordered Stop   01/25/23 1600  cefTRIAXone (ROCEPHIN) 2 g in sodium chloride 0.9 % 100 mL IVPB        2 g 200 mL/hr over 30 Minutes Intravenous Every 24 hours 01/25/23 1547     01/25/23 1600  azithromycin (ZITHROMAX) 500 mg in dextrose 5 % 250 mL IVPB        500 mg 250 mL/hr over 60 Minutes Intravenous Every 24 hours 01/25/23 1547           Objective:   Vitals:   01/25/23 2016 01/26/23 0004 01/26/23 0008 01/26/23 0423  BP: (!) 199/67 (!) 150/57  (!) 152/62  Pulse: 78  79 81  Resp: (!) 31 (!) 22 (!) 24 (!) 31  Temp:    99.5 F (37.5 C)  TempSrc:    Axillary  SpO2: 100%  100% 100%  Weight:        Wt  Readings from Last 3 Encounters:  01/25/23 61.2 kg  01/08/23 68 kg  11/06/22 67 kg    No intake or output data in the 24 hours ending 01/26/23 0828   Physical Exam  Awake therapy confused and mumbling, left arm in sling, cannot follow commands,  [place].AT,PERRAL Supple Neck, No JVD,   Symmetrical Chest wall movement, Good air movement bilaterally, CTAB RRR,No Gallops,Rubs or new Murmurs,  +ve B.Sounds, Abd Soft, No tenderness,   No Cyanosis, Clubbing or edema       Data Review:    Recent Labs  Lab 01/24/23 0417 01/25/23 1045 01/25/23 1052 01/26/23 0415  WBC 11.2* 8.6  --  10.2  HGB 10.7* 10.6* 10.9* 11.1*  HCT 33.7* 34.6* 32.0* 34.5*  PLT 363 337  --  356  MCV 96.3 100.6*  --  98.0  MCH 30.6 30.8  --  31.5  MCHC 31.8 30.6  --  32.2  RDW 13.6 13.7  --  13.6  LYMPHSABS  --  1.3  --   --   MONOABS  --  0.7  --   --   EOSABS  --  0.1  --   --   BASOSABS  --  0.1  --   --     Recent Labs  Lab 01/22/23 0635 01/23/23 0512 01/24/23 0417 01/25/23 1045 01/25/23 1052 01/25/23 1648 01/25/23 1911 01/25/23 2325 01/26/23 0415  NA 144 143 144 146* 145  --   --   --  146*  K 4.5 3.9 3.9 3.6 3.5  --   --   --  3.5  CL 96* 98 99 103 102  --   --   --  103  CO2 36* 34* 33* 30  --   --   --   --  30  ANIONGAP 12 11 12 13   --   --   --   --  13  GLUCOSE 138* 157* 174* 167* 172*  --   --   --  138*  BUN 37* 37* 32* 29* 31*  --   --   --  29*  CREATININE 1.19* 1.41* 1.26* 1.36* 1.40*  --   --   --  1.41*  AST  --   --   --  32  --   --   --   --   --   ALT  --   --   --  20  --   --   --   --   --   ALKPHOS  --   --   --  72  --   --   --   --   --   BILITOT  --   --   --  0.7  --   --   --   --   --   ALBUMIN  --   --   --  3.2*  --   --   --   --   --   PROCALCITON  --   --   --   --   --  <0.10  --   --   --   LATICACIDVEN  --   --   --   --   --   --  1.3 1.2  --   INR  --   --   --  1.1  --   --   --   --   --   HGBA1C  --   --   --   --   --   --   --   --  6.6*  MG   --  2.3  --   --   --   --   --   --   --   CALCIUM 9.2 9.0 8.9 9.0  --   --   --   --  9.3      Recent Labs  Lab 01/22/23 0635 01/23/23 0512 01/24/23 0417 01/25/23 1045 01/25/23 1648 01/25/23 1911 01/25/23 2325 01/26/23 0415  PROCALCITON  --   --   --   --  <0.10  --   --   --   LATICACIDVEN  --   --   --   --   --  1.3 1.2  --   INR  --   --   --  1.1  --   --   --   --   HGBA1C  --   --   --   --   --   --   --  6.6*  MG  --  2.3  --   --   --   --   --   --  CALCIUM 9.2 9.0 8.9 9.0  --   --   --  9.3    --------------------------------------------------------------------------------------------------------------- Lab Results  Component Value Date   CHOL 172 01/26/2023   HDL 66 01/26/2023   LDLCALC 78 01/26/2023   TRIG 138 01/26/2023   CHOLHDL 2.6 01/26/2023    Lab Results  Component Value Date   HGBA1C 6.6 (H) 01/26/2023      Micro Results Recent Results (from the past 240 hour(s))  Respiratory (~20 pathogens) panel by PCR     Status: None   Collection Time: 01/25/23  2:20 PM   Specimen: Anterior Nasal Swab; Respiratory  Result Value Ref Range Status   Adenovirus NOT DETECTED NOT DETECTED Final   Coronavirus 229E NOT DETECTED NOT DETECTED Final    Comment: (NOTE) The Coronavirus on the Respiratory Panel, DOES NOT test for the novel  Coronavirus (2019 nCoV)    Coronavirus HKU1 NOT DETECTED NOT DETECTED Final   Coronavirus NL63 NOT DETECTED NOT DETECTED Final   Coronavirus OC43 NOT DETECTED NOT DETECTED Final   Metapneumovirus NOT DETECTED NOT DETECTED Final   Rhinovirus / Enterovirus NOT DETECTED NOT DETECTED Final   Influenza A NOT DETECTED NOT DETECTED Final   Influenza B NOT DETECTED NOT DETECTED Final   Parainfluenza Virus 1 NOT DETECTED NOT DETECTED Final   Parainfluenza Virus 2 NOT DETECTED NOT DETECTED Final   Parainfluenza Virus 3 NOT DETECTED NOT DETECTED Final   Parainfluenza Virus 4 NOT DETECTED NOT DETECTED Final   Respiratory Syncytial  Virus NOT DETECTED NOT DETECTED Final   Bordetella pertussis NOT DETECTED NOT DETECTED Final   Bordetella Parapertussis NOT DETECTED NOT DETECTED Final   Chlamydophila pneumoniae NOT DETECTED NOT DETECTED Final   Mycoplasma pneumoniae NOT DETECTED NOT DETECTED Final    Comment: Performed at [facility], [address] [place], [place] [address]  SARS Coronavirus 2 by RT PCR (hospital order, performed in [facility] hospital lab) *cepheid single result test* Anterior Nasal Swab     Status: None   Collection Time: 01/25/23  2:20 PM   Specimen: Anterior Nasal Swab  Result Value Ref Range Status   SARS Coronavirus 2 by RT PCR NEGATIVE NEGATIVE Final    Comment: Performed at [facility], [address], [place], [place] [address]  MRSA Next Gen by PCR, Nasal     Status: Abnormal   Collection Time: 01/25/23  6:15 PM  Result Value Ref Range Status   MRSA by PCR Next Gen DETECTED (A) NOT DETECTED Final    Comment: RESULT CALLED TO, READ BACK BY AND VERIFIED WITH: RN VICTORIA JYEKYE ON 01/25/23 @ 2244 BY DRT (NOTE) The GeneXpert MRSA Assay (FDA approved for NASAL specimens only), is one component of a comprehensive MRSA colonization surveillance program. It is not intended to diagnose MRSA infection nor to guide or monitor treatment for MRSA infections. Test performance is not FDA approved in patients less than 78 years old. Performed at [facility], [address], [place], [place] [address]   Culture, blood (Routine X 2) w Reflex to ID Panel     Status: None (Preliminary result)   Collection Time: 01/25/23  7:11 PM   Specimen: BLOOD  Result Value Ref Range Status   Specimen Description BLOOD SITE NOT SPECIFIED  Final   Special Requests   Final    BOTTLES DRAWN AEROBIC AND ANAEROBIC Blood Culture adequate volume Performed at [facility], [address], [place], [place] [address]  Culture PENDING  Incomplete   Report Status PENDING  Incomplete     Radiology Reports DG Chest Port 1 View  Result Date: 01/26/2023 CLINICAL DATA:  Shortness of breath. EXAM: PORTABLE CHEST 1 VIEW COMPARISON:  01/25/2023 FINDINGS: Lungs are hyperexpanded. Cardiopericardial silhouette is at upper limits of normal for size. Interstitial markings are diffusely coarsened with chronic features. Patchy airspace disease again noted left lung base. Nodular density overlying the right lower lung likely represents focal calcification in anterior costal cartilage seen on recent chest CT. No substantial pleural effusion. Telemetry leads overlie the chest. IMPRESSION: 1. Hyperexpansion with chronic interstitial coarsening. 2. Patchy airspace disease left lung base, similar to prior. Electronically Signed   By: Kennith Center M.D.   On: 01/26/2023 08:00   DG Knee 1-2 Views Left  Result Date: 01/25/2023 CLINICAL DATA:  Left knee bruising. EXAM: LEFT KNEE - 1-2 VIEW COMPARISON:  None Available. FINDINGS: No fracture or dislocation. Mild medial tibiofemoral joint space narrowing. Mild peripheral spurring. Small quadriceps tendon enthesophyte. No significant knee joint effusion. No erosion or focal bone abnormality. No focal soft tissue abnormalities are seen. IMPRESSION: Mild osteoarthritis. Electronically Signed   By: Narda Rutherford M.D.   On: 01/25/2023 22:58   DG Knee 1-2 Views Right  Result Date: 01/25/2023 CLINICAL DATA:  Bruising on both knees. EXAM: RIGHT KNEE - 1-2 VIEW COMPARISON:  None Available. FINDINGS: No fracture or dislocation. Tricompartmental osteoarthritis most prominent in the medial tibiofemoral compartment. No significant knee joint effusion. No erosions or focal bone abnormality. No focal soft tissue abnormality. 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 Electronically Signed   By: Narda Rutherford M.D.   On: 01/25/2023 22:52   VAS US CAROTID (at [facility] and [facility] only)  Result Date: 01/25/2023 Carotid  Arterial Duplex Study Patient Name:  KYMORAH PURSLEY  Date of Exam:   01/25/2023 Medical Rec #: [id number]      Accession #:    [id number] Date of Birth: 11-02-1940     Patient Gender: F Patient Age:   79 years Exam Location:  [facility] Procedure:      VAS US CAROTID Referring Phys: Dewitt Hoes DE LA TORRE --------------------------------------------------------------------------------  Indications:       CVA. Risk Factors:      Hypertension. Limitations        Today's exam was limited due to the patient's respiratory                    variation and patient movement and talking. Comparison Study:  No prior exam. Performing Technologist: Fernande Bras  Examination Guidelines: A complete evaluation includes B-mode imaging, spectral Doppler, color Doppler, and power Doppler as needed of all accessible portions of each vessel. Bilateral testing is considered an integral part of a complete examination. Limited examinations for reoccurring indications may be performed as noted.  Right Carotid Findings: +----------+--------+--------+--------+------------------+--------+           PSV cm/sEDV cm/sStenosisPlaque DescriptionComments +----------+--------+--------+--------+------------------+--------+ CCA Prox  88      18                                         +----------+--------+--------+--------+------------------+--------+ CCA Distal80      12                                         +----------+--------+--------+--------+------------------+--------+  ICA Prox  53      16                                         +----------+--------+--------+--------+------------------+--------+ ICA Mid   55      17                                         +----------+--------+--------+--------+------------------+--------+ ICA Distal80      20                                         +----------+--------+--------+--------+------------------+--------+ ECA       123     12                                          +----------+--------+--------+--------+------------------+--------+ +----------+--------+-------+--------+-------------------+           PSV cm/sEDV cmsDescribeArm Pressure (mmHG) +----------+--------+-------+--------+-------------------+ [id number]      11                                 +----------+--------+-------+--------+-------------------+ +---------+--------+--+--------+-+ VertebralPSV cm/s53EDV cm/s9 +---------+--------+--+--------+-+ Limited view of right vertebral. Left Carotid Findings: +----------+--------+--------+--------+-------------------------+--------+           PSV cm/sEDV cm/sStenosisPlaque Description       Comments +----------+--------+--------+--------+-------------------------+--------+ CCA Prox  122     22                                                +----------+--------+--------+--------+-------------------------+--------+ CCA Distal96      16                                                +----------+--------+--------+--------+-------------------------+--------+ ICA Prox  129     18              heterogenous and calcific         +----------+--------+--------+--------+-------------------------+--------+ ICA Mid   87      22                                                +----------+--------+--------+--------+-------------------------+--------+ ICA Distal103     30                                                +----------+--------+--------+--------+-------------------------+--------+ ECA       395     31                                                +----------+--------+--------+--------+-------------------------+--------+ +----------+--------+--------+--------+-------------------+  PSV cm/sEDV cm/sDescribeArm Pressure (mmHG) +----------+--------+--------+--------+-------------------+ Subclavian101     21                                   +----------+--------+--------+--------+-------------------+ +---------+--------+--+--------+--+ VertebralPSV cm/s96EDV cm/s16 +---------+--------+--+--------+--+   Summary: Right Carotid: Velocities in the right ICA are consistent with a 1-39% stenosis.                Non-hemodynamically significant plaque <50% noted in the CCA. The                ECA appears >50% stenosed. Left Carotid: Velocities in the left ICA are consistent with a 1-39% stenosis.               The ECA appears <50% stenosed. Vertebrals:  Bilateral vertebral arteries demonstrate antegrade flow. Subclavians: Normal flow hemodynamics were seen in bilateral subclavian              arteries. *See table(s) above for measurements and observations.     Preliminary    ECHOCARDIOGRAM COMPLETE  Result Date: 01/25/2023    ECHOCARDIOGRAM REPORT   Patient Name:   Kaimana Laymance Date of Exam: 01/25/2023 Medical Rec #:  [id number]     Height:       64.0 in Accession #:    [id number]    Weight:       134.9 lb Date of Birth:  26-Apr-1940    BSA:          1.655 m Patient Age:    70 years      BP:           163/59 mmHg Patient Gender: F             HR:           69 bpm. Exam Location:  Inpatient Procedure: 2D Echo, Cardiac Doppler and Color Doppler Indications:    Stroke  History:        Patient has prior history of Echocardiogram examinations, most                 recent 04/18/2022. CHF, COPD and CKD, stage 3; Risk                 Factors:Hypertension.  Sonographer:    Lucendia Herrlich RCS Referring Phys: Lennox Solders DE LA TORRE IMPRESSIONS  1. Left ventricular ejection fraction, by estimation, is 70 to 75%. The left ventricle has hyperdynamic function. The left ventricle has no regional wall motion abnormalities. Left ventricular diastolic function could not be evaluated.  2. Right ventricular systolic function is normal. The right ventricular size is normal.  3. The mitral valve is grossly normal. No evidence of mitral valve regurgitation.  4. The aortic  valve has an indeterminant number of cusps. Aortic valve regurgitation is not visualized. No aortic stenosis is present.  5. The inferior vena cava is normal in size with greater than 50% respiratory variability, suggesting right atrial pressure of 3 mmHg. Comparison(s): The left ventricular function is unchanged. FINDINGS  Left Ventricle: Left ventricular ejection fraction, by estimation, is 70 to 75%. The left ventricle has hyperdynamic function. The left ventricle has no regional wall motion abnormalities. The left ventricular internal cavity size was normal in size. There is no left ventricular hypertrophy. Left ventricular diastolic function could not be evaluated. Right Ventricle: The right ventricular size is normal. Right vetricular wall thickness was not assessed. Right ventricular systolic  function is normal. Left Atrium: Left atrial size was normal in size. Right Atrium: Right atrial size was normal in size. Pericardium: There is no evidence of pericardial effusion. Mitral Valve: The mitral valve is grossly normal. No evidence of mitral valve regurgitation. Tricuspid Valve: The tricuspid valve is normal in structure. Tricuspid valve regurgitation is trivial. Aortic Valve: The aortic valve has an indeterminant number of cusps. Aortic valve regurgitation is not visualized. No aortic stenosis is present. Aortic valve peak gradient measures 6.9 mmHg. Pulmonic Valve: The pulmonic valve was normal in structure. Pulmonic valve regurgitation is not visualized. Aorta: The aortic root and ascending aorta are structurally normal, with no evidence of dilitation. Venous: The inferior vena cava is normal in size with greater than 50% respiratory variability, suggesting right atrial pressure of 3 mmHg. IAS/Shunts: No atrial level shunt detected by color flow Doppler.  LEFT VENTRICLE PLAX 2D LVIDd:         4.20 cm   Diastology LVIDs:         2.80 cm   LV e' medial:    4.24 cm/s LV PW:         0.80 cm   LV E/e' medial:   18.5 LV IVS:        0.80 cm   LV e' lateral:   3.92 cm/s LVOT diam:     1.80 cm   LV E/e' lateral: 20.0 LV SV:         49 LV SV Index:   29 LVOT Area:     2.54 cm  RIGHT VENTRICLE             IVC RV S prime:     11.70 cm/s  IVC diam: 1.60 cm TAPSE (M-mode): 2.8 cm LEFT ATRIUM             Index        RIGHT ATRIUM           Index LA diam:        2.70 cm 1.63 cm/m   RA Area:     11.10 cm LA Vol (A2C):   30.0 ml 18.13 ml/m  RA Volume:   22.00 ml  13.29 ml/m LA Vol (A4C):   26.1 ml 15.77 ml/m LA Biplane Vol: 31.2 ml 18.85 ml/m  AORTIC VALVE AV Area (Vmax): 1.96 cm AV Vmax:        131.00 cm/s AV Peak Grad:   6.9 mmHg LVOT Vmax:      101.00 cm/s LVOT Vmean:     64.300 cm/s LVOT VTI:       0.191 m  AORTA Ao Root diam: 3.10 cm Ao Asc diam:  2.80 cm MITRAL VALVE               TRICUSPID VALVE MV Area (PHT): 2.45 cm    TR Peak grad:   4.9 mmHg MV Decel Time: 310 msec    TR Vmax:        111.00 cm/s MV E velocity: 78.40 cm/s MV A velocity: 97.70 cm/s  SHUNTS MV E/A ratio:  0.80        Systemic VTI:  0.19 m                            Systemic Diam: 1.80 cm Dietrich Pates MD Electronically signed by Dietrich Pates MD Signature Date/Time: 01/25/2023/4:52:48 PM    Final    EEG adult  Result Date: 01/25/2023 Lindie Spruce  Val Eagle, MD     01/25/2023  4:52 PM Patient Name: Trevina Catallo MRN: [id number] Epilepsy Attending: Charlsie Quest Referring Physician/Provider: Marjorie Smolder, N Date: 01/25/2023 Duration: 25.40 mins Patient history: 82yo F with right sided weakness getting eeg to evaluate for seizure Level of alertness: Awake AEDs during EEG study: None Technical aspects: This EEG study was done with scalp electrodes positioned according to the 10-20 International system of electrode placement. Electrical activity was reviewed with band pass filter of 1-70Hz , sensitivity of 7 uV/mm, display speed of 47mm/sec with a 60Hz  notched filter applied as appropriate. EEG data were recorded continuously and digitally stored.  Video  monitoring was available and reviewed as appropriate. Description: The posterior dominant rhythm consists of 8 Hz activity of moderate voltage (25-35 uV) seen predominantly in posterior head regions, symmetric and reactive to eye opening and eye closing. EEG showed intermittent generalized 3 to 6 Hz theta-delta slowing. Sharp transients were noted in left>right posterior quadrant. Hyperventilation and photic stimulation were not performed.   Patient was noted to have chewing like movements intermittently. Concomitant eeg before, during and after the event didn't show any eeg change to suggest seizure. ABNORMALITY - Intermittent slow, generalized IMPRESSION: This study is suggestive of mild diffuse encephalopathy. No seizures or definite epileptiform discharges were seen throughout the recording. Patient was noted to have chewing like movements intermittently without concomitant eeg change. These events are non-epileptic. Charlsie Quest   MR BRAIN WO CONTRAST  Result Date: 01/25/2023 CLINICAL DATA:  Stroke, follow-up. EXAM: MRI HEAD WITHOUT CONTRAST MRA HEAD WITHOUT CONTRAST TECHNIQUE: Multiplanar, multi-echo pulse sequences of the brain and surrounding structures were acquired without intravenous contrast. Angiographic images of the Circle of Willis were acquired using MRA technique without intravenous contrast. COMPARISON:  Head CT January 25, 2023. FINDINGS: MRI HEAD FINDINGS Brain: Confluent areas of restricted diffusion involving the insula, posterior frontal and parietal lobes, consistent with acute/subacute infarct in the posterior left MCA territory correlating with areas of hypodensity seen on recent head CT. No hemorrhagic transformation or significant mass effect. Scattered confluent foci of T2 hyperintensity are seen within the white matter of the cerebral hemispheres, nonspecific, most likely related to chronic small vessel ischemia. Mild parenchymal volume loss. Vascular: Susceptibility artifact  is seen in the anterior left sylvian fissure and left parietal sulcus, most likely related to vessel thrombosis. Skull and upper cervical spine: Normal marrow signal. Sinuses/Orbits: Bilateral lens surgery. Paranasal sinuses are essentially clear. Bilateral mastoid effusion. Other: None. MRA HEAD FINDINGS Anterior circulation: Normal caliber and flow related enhancement of the bilateral intracranial ICAs. Focus of loss of flow related enhancement is seen in the left MCA bifurcation (series 3, image 95), may represent nonocclusive thrombus. Loss of flow related enhancement is also seen a proximal left M2/MCA posterior branch with reconstitution of flow at the distal M2 segment and subsequent loss of flow at the M3 level. The right MCA vascular tree remains patent. The azygous ACA has normal caliber and flow related enhancement. Posterior circulation: Hypoplastic right vertebral artery supplying primarily the right PICA with minimal flow to the basilar artery. The dominant left vertebral artery has normal caliber flow related enhancement. Normal caliber and flow related enhancement of the basilar artery and right posterior cerebral artery. Diffusely small caliber of the P1 and proximal P2 segment of the fetal left PCA which remains patent. Anatomic variants: Hypoplastic left A1/ACA with azygous A2/ACA segment. Fetal left PCA. IMPRESSION: 1. Acute/subacute infarct in the posterior left MCA territory correlating  with areas of hypodensity seen on recent head CT. No hemorrhagic transformation or significant mass effect. 2. Loss of flow related enhancement in the left MCA bifurcation may represent nonocclusive thrombus. 3. 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 4. Diffusely small caliber of the P1 and proximal P2 segment of the fetal left PCA which remains patent. 5. Moderate chronic microvascular ischemic changes of the white matter. Electronically Signed   By: Baldemar Lenis M.D.   On:  01/25/2023 13:59   MR ANGIO HEAD WO CONTRAST  Result Date: 01/25/2023 CLINICAL DATA:  Stroke, follow-up. EXAM: MRI HEAD WITHOUT CONTRAST MRA HEAD WITHOUT CONTRAST TECHNIQUE: Multiplanar, multi-echo pulse sequences of the brain and surrounding structures were acquired without intravenous contrast. Angiographic images of the Circle of Willis were acquired using MRA technique without intravenous contrast. COMPARISON:  Head CT January 25, 2023. FINDINGS: MRI HEAD FINDINGS Brain: Confluent areas of restricted diffusion involving the insula, posterior frontal and parietal lobes, consistent with acute/subacute infarct in the posterior left MCA territory correlating with areas of hypodensity seen on recent head CT. No hemorrhagic transformation or significant mass effect. Scattered confluent foci of T2 hyperintensity are seen within the white matter of the cerebral hemispheres, nonspecific, most likely related to chronic small vessel ischemia. Mild parenchymal volume loss. Vascular: Susceptibility artifact is seen in the anterior left sylvian fissure and left parietal sulcus, most likely related to vessel thrombosis. Skull and upper cervical spine: Normal marrow signal. Sinuses/Orbits: Bilateral lens surgery. Paranasal sinuses are essentially clear. Bilateral mastoid effusion. Other: None. MRA HEAD FINDINGS Anterior circulation: Normal caliber and flow related enhancement of the bilateral intracranial ICAs. Focus of loss of flow related enhancement is seen in the left MCA bifurcation (series 3, image 95), may represent nonocclusive thrombus. Loss of flow related enhancement is also seen a proximal left M2/MCA posterior branch with reconstitution of flow at the distal M2 segment and subsequent loss of flow at the M3 level. The right MCA vascular tree remains patent. The azygous ACA has normal caliber and flow related enhancement. Posterior circulation: Hypoplastic right vertebral artery supplying primarily the right PICA  with minimal flow to the basilar artery. The dominant left vertebral artery has normal caliber flow related enhancement. Normal caliber and flow related enhancement of the basilar artery and right posterior cerebral artery. Diffusely small caliber of the P1 and proximal P2 segment of the fetal left PCA which remains patent. Anatomic variants: Hypoplastic left A1/ACA with azygous A2/ACA segment. Fetal left PCA. IMPRESSION: 1. Acute/subacute infarct in the posterior left MCA territory correlating with areas of hypodensity seen on recent head CT. No hemorrhagic transformation or significant mass effect. 2. Loss of flow related enhancement in the left MCA bifurcation may represent nonocclusive thrombus. 3. 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 4. Diffusely small caliber of the P1 and proximal P2 segment of the fetal left PCA which remains patent. 5. Moderate chronic microvascular ischemic changes of the white matter. Electronically Signed   By: Baldemar Lenis M.D.   On: 01/25/2023 13:59   DG Chest Portable 1 View  Result Date: 01/25/2023 CLINICAL DATA:  Altered mental status. EXAM: PORTABLE CHEST 1 VIEW COMPARISON:  January 10, 2023. FINDINGS: The heart size and mediastinal contours are within normal limits. Both lungs are clear. Moderately displaced distal left clavicular fracture is again noted. IMPRESSION: No acute cardiopulmonary abnormality seen. Moderately displaced distal left clavicular fracture. Electronically Signed   By: Lupita Raider M.D.   On:  01/25/2023 13:30   CT HEAD CODE STROKE WO CONTRAST  Result Date: 01/25/2023 CLINICAL DATA:  Code stroke. Neuro deficit, acute, stroke suspected. Aphasia. EXAM: CT HEAD WITHOUT CONTRAST TECHNIQUE: Contiguous axial images were obtained from the base of the skull through the vertex without intravenous contrast. RADIATION DOSE REDUCTION: This exam was performed according to the departmental dose-optimization program which includes  automated exposure control, adjustment of the mA and/or kV according to patient size and/or use of iterative reconstruction technique. COMPARISON:  Head CT 01/08/2023 FINDINGS: Brain: There are acute to early subacute appearing infarcts in the left MCA territory involving the insula and cortex and white matter of the parietal lobe at the level of the operculum and corona radiata as well as more posterior superiorly in the parietal lobe. No intracranial hemorrhage, mass, midline shift, or extra-axial fluid collection is identified. Patchy hypodensities elsewhere in the cerebral white matter bilaterally are similar to the prior study and are nonspecific but compatible with chronic small vessel ischemic disease which is relatively mild for age. Mild cerebral atrophy is within normal limits for age. Vascular: Calcified atherosclerosis at the skull base. No hyperdense vessel. Skull: No acute fracture or suspicious osseous lesion. Sinuses/Orbits: Visualized paranasal sinuses are clear. Small right mastoid effusion. Bilateral cataract extraction. Other: None. ASPECTS [facility] Stroke Program Early CT Score) - Ganglionic level infarction (caudate, lentiform nuclei, internal capsule, insula, M1-M3 cortex): 5 - Supraganglionic infarction (M4-M6 cortex): 2 Total score (0-10 with 10 being normal): 7 These results were communicated to Dr. Selina Cooley at 11:02 am on 01/25/2023 by text page via the [facility] messaging system. 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 ASPECTS of 7. Electronically Signed   By: Sebastian Ache M.D.   On: 01/25/2023 11:03      Signature  -   Susa Raring M.D on 01/26/2023 at 8:28 AM   -  To page go to [email or web address]

## 2023-01-26 NOTE — [place] FL2 (Signed)
[place] MEDICAID FL2 LEVEL OF CARE FORM     IDENTIFICATION  Patient Name: Samantha Carpenter Birthdate: December 06, 1940 Sex: female Admission Date (Current Location): 01/25/2023  [employer] and [place] Number:  Chiropodist and Address:  [facility], [address] [place], [place] [address]      Provider Number: [id number]  Attending Physician Name and Address:  Leroy Sea, MD  Relative Name and Phone Number:  [name] (sister) [phone]    Current Level of Care: Hospital Recommended Level of Care: Skilled Nursing Facility Prior Approval Number:    Date Approved/Denied:   PASRR Number: [id number]  Discharge Plan: SNF    Current Diagnoses: Patient Active Problem List   Diagnosis Date Noted   CVA (cerebral vascular accident) (HCC) 01/25/2023   SIRS (systemic inflammatory response syndrome) (HCC) 01/25/2023   Clavicle fracture 01/25/2023   Falls 01/25/2023   Knee contusion 01/25/2023   Macrocytic anemia 01/25/2023   Acute hypoxic respiratory failure (HCC) 01/08/2023   CAP (community acquired pneumonia) 01/08/2023   Acute hyponatremia 04/23/2022   Chronic diastolic CHF (congestive heart failure) (HCC) 04/16/2022   Hyponatremia 04/16/2022   Hypothermia 04/16/2022   Hypomagnesemia 02/15/2022   RSV (respiratory syncytial virus pneumonia) 02/15/2022   COPD with acute exacerbation (HCC) 02/10/2022   Hypokalemia 02/10/2022   Falls frequently 02/10/2022   Hypoglycemia 01/11/2022   AKI (acute kidney injury) (HCC) 01/10/2022   Pressure injury of skin 01/09/2022   COPD exacerbation (HCC) 01/08/2022   Prediabetes 01/08/2022   Dementia with behavioral disturbance (HCC) 11/15/2021   Hypoxia 11/15/2021   COVID-19 virus infection 11/14/2021   Right rib fracture 11/09/2020   Fall at home, initial encounter 11/09/2020   COPD (chronic obstructive pulmonary disease) (HCC) 11/09/2020   Depression with anxiety 11/09/2020   Elevated troponin  11/09/2020   Acute respiratory failure with hypoxia (HCC) 11/09/2020   HTN (hypertension) 11/09/2020   Abrasions of multiple sites    Pure hypercholesterolemia 06/09/2018   Recurrent major depressive disorder, 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   HTN, goal below 140/80 04/28/2018   CKD stage 3a, GFR 45-59 ml/min (HCC) 04/28/2018    Orientation RESPIRATION BLADDER Height & Weight     Self  O2 (2L nasal cannula) Incontinent Weight: 134 lb 14.7 oz (61.2 kg) Height:     BEHAVIORAL SYMPTOMS/MOOD NEUROLOGICAL BOWEL NUTRITION STATUS      Incontinent Diet (see dc summary)  AMBULATORY STATUS COMMUNICATION OF NEEDS Skin   Extensive Assist Verbally Other (Comment) (laceration on face; MASD on groin)                       Personal Care Assistance Level of Assistance  Bathing, Feeding, Dressing Bathing Assistance: Maximum assistance Feeding assistance: Limited assistance Dressing Assistance: Maximum assistance     Functional Limitations Info             SPECIAL CARE FACTORS FREQUENCY  PT (By licensed PT), OT (By licensed OT)     PT Frequency: 5x/week OT Frequency: 5x/week            Contractures Contractures Info: Not present    Additional Factors Info  Code Status, Allergies, Psychotropic, Insulin Sliding Scale Code Status Info: DNR Allergies Info: Morphine And Codeine, Latex Psychotropic Info: Cymbalta Insulin Sliding Scale Info: see dc summary       Current Medications (01/26/2023):  This is the current hospital active medication list Current Facility-Administered Medications  Medication  Dose Route Frequency Provider Last Rate Last Admin   acetaminophen (TYLENOL) tablet 650 mg  650 mg Oral Q6H PRN Clydie Braun, MD       Or   acetaminophen (TYLENOL) suppository 650 mg  650 mg Rectal Q6H PRN Katrinka Blazing, Rondell A, MD       albuterol (PROVENTIL) (2.5 MG/3ML) 0.083% nebulizer solution 2.5 mg  2.5 mg Nebulization Q6H PRN  Smith, Rondell A, MD       Ampicillin-Sulbactam (UNASYN) 3 g in sodium chloride 0.9 % 100 mL IVPB  3 g Intravenous Q12H Leroy Sea, MD       aspirin suppository 300 mg  300 mg Rectal Daily de Saintclair Halsted, Cortney E, NP   300 mg at 01/26/23 1008   bisoprolol (ZEBETA) tablet 10 mg  10 mg Oral Daily Gevena Mart A, NP   10 mg at 01/26/23 1024   dextrose 5 % solution   Intravenous Continuous Leroy Sea, MD 75 mL/hr at 01/26/23 0635 New Bag at 01/26/23 0635   DULoxetine (CYMBALTA) DR capsule 60 mg  60 mg Oral Daily Madelyn Flavors A, MD   60 mg at 01/26/23 1024   enoxaparin (LOVENOX) injection 40 mg  40 mg Subcutaneous Q24H Madelyn Flavors A, MD   40 mg at 01/25/23 1854   gabapentin (NEURONTIN) capsule 100 mg  100 mg Oral BID Madelyn Flavors A, MD   100 mg at 01/26/23 1024   hydrALAZINE (APRESOLINE) tablet 25 mg  25 mg Oral Q6H PRN Segars, Christiane Ha, MD       insulin aspart (novoLOG) injection 0-5 Units  0-5 Units Subcutaneous QHS Singh, Stanford Scotland, MD       insulin aspart (novoLOG) injection 0-9 Units  0-9 Units Subcutaneous TID WC Singh, Stanford Scotland, MD       methylPREDNISolone (MEDROL DOSEPAK) tablet 4 mg  4 mg Oral PC lunch Leroy Sea, MD       methylPREDNISolone (MEDROL DOSEPAK) tablet 4 mg  4 mg Oral PC supper Leroy Sea, MD       [START ON 01/27/2023] methylPREDNISolone (MEDROL DOSEPAK) tablet 4 mg  4 mg Oral 3 x daily with food Leroy Sea, MD       [START ON 01/28/2023] methylPREDNISolone (MEDROL DOSEPAK) tablet 4 mg  4 mg Oral 4X daily taper Leroy Sea, MD       methylPREDNISolone (MEDROL DOSEPAK) tablet 8 mg  8 mg Oral AC breakfast Leroy Sea, MD       methylPREDNISolone (MEDROL DOSEPAK) tablet 8 mg  8 mg Oral Nightly Leroy Sea, MD       [START ON 01/27/2023] methylPREDNISolone (MEDROL DOSEPAK) tablet 8 mg  8 mg Oral Nightly Leroy Sea, MD       mometasone-formoterol (DULERA) 200-5 MCG/ACT inhaler 2 puff  2 puff Inhalation BID Katrinka Blazing,  Rondell A, MD       OLANZapine (ZYPREXA) tablet 2.5 mg  2.5 mg Oral BID Katrinka Blazing, Rondell A, MD       ondansetron (ZOFRAN) injection 4 mg  4 mg Intravenous Q6H PRN Smith, Rondell A, MD       rosuvastatin (CRESTOR) tablet 5 mg  5 mg Oral Daily Susa Raring K, MD   5 mg at 01/26/23 1024   sodium chloride flush (NS) 0.9 % injection 3 mL  3 mL Intravenous Q12H Smith, Rondell A, MD   3 mL at 01/26/23 0957   traMADol (ULTRAM) tablet 50 mg  50 mg Oral  Q6H PRN Leroy Sea, MD         Discharge Medications: Please see discharge summary for a list of discharge medications.  Relevant Imaging Results:  Relevant Lab Results:   Additional Information SSN: [id number]  Mearl Latin, LCSW

## 2023-01-26 NOTE — TOC Initial Note (Signed)
Transition of Care [facility]) - Initial/Assessment Note    Patient Details  Name: Samantha Carpenter MRN: [id number] Date of Birth: [birth year]  [facility]) CM/SW Contact:    Mearl Latin, LCSW Phone Number: 01/26/2023, 12:07 PM  Clinical Narrative:                 CSW attempted to call patient's sister, Dionisio David, but voicemail is not set up. Patient admitted from [facility] where she was there only one day. She would need insurance approval to return. She was at an ALF prior to that. Will continue to follow.   Expected Discharge Plan: Skilled Nursing Facility Barriers to Discharge: Continued Medical Work up, English as a second language teacher, SNF Pending bed offer   Patient Goals and CMS Choice            Expected Discharge Plan and Services In-house Referral: Clinical Social Work     Living arrangements for the past 2 months: Assisted Living Facility                                      Prior Living Arrangements/Services Living arrangements for the past 2 months: Assisted Living Facility Lives with:: Facility Resident Patient language and need for interpreter reviewed:: Yes Do you feel safe going back to the place where you live?: Yes      Need for Family Participation in Patient Care: Yes (Comment) Care giver support system in place?: Yes (comment)   Criminal Activity/Legal Involvement Pertinent to Current Situation/Hospitalization: No - Comment as needed  Activities of Daily Living      Permission Sought/Granted Permission sought to share information with : Facility Medical sales representative, Family Supports Permission granted to share information with : No  Share Information with NAME: [name]  Permission granted to share info w AGENCY: SNF  Permission granted to share info w Relationship: Sister/Guardian  Permission granted to share info w Contact Information: [phone]  Emotional Assessment Appearance:: Appears stated  age Attitude/Demeanor/Rapport: Unable to Assess Affect (typically observed): Unable to Assess Orientation: : Oriented to Self Alcohol / Substance Use: Not Applicable Psych Involvement: No (comment)  Admission diagnosis:  CVA (cerebral vascular accident) (HCC) [I63.9] Cerebrovascular accident (CVA), unspecified mechanism (HCC) [I63.9] Closed nondisplaced fracture of left clavicle, unspecified part of clavicle, initial encounter [S42.002A] Patient Active Problem List   Diagnosis Date Noted   CVA (cerebral vascular accident) (HCC) 01/25/2023   SIRS (systemic inflammatory response syndrome) (HCC) 01/25/2023   Clavicle fracture 01/25/2023   Falls 01/25/2023   Knee contusion 01/25/2023   Macrocytic anemia 01/25/2023   Acute hypoxic respiratory failure (HCC) 01/08/2023   CAP (community acquired pneumonia) 01/08/2023   Acute hyponatremia 04/23/2022   Chronic diastolic CHF (congestive heart failure) (HCC) 04/16/2022   Hyponatremia 04/16/2022   Hypothermia 04/16/2022   Hypomagnesemia 02/15/2022   RSV (respiratory syncytial virus pneumonia) 02/15/2022   COPD with acute exacerbation (HCC) 02/10/2022   Hypokalemia 02/10/2022   Falls frequently 02/10/2022   Hypoglycemia 01/11/2022   AKI (acute kidney injury) (HCC) 01/10/2022   Pressure injury of skin 01/09/2022   COPD exacerbation (HCC) 01/08/2022   Prediabetes 01/08/2022   Dementia with behavioral disturbance (HCC) 11/15/2021   Hypoxia 11/15/2021   COVID-19 virus infection 11/14/2021   Right rib fracture 11/09/2020   Fall at home, initial encounter 11/09/2020   COPD (chronic obstructive pulmonary disease) (HCC) 11/09/2020   Depression with anxiety 11/09/2020   Elevated  troponin 11/09/2020   Acute respiratory failure with hypoxia (HCC) 11/09/2020   HTN (hypertension) 11/09/2020   Abrasions of multiple sites    Pure hypercholesterolemia 06/09/2018   Recurrent major depressive disorder, 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 [place] [address] Phone: [phone] Fax: [phone]     Social Determinants of Health (SDOH) Social History: SDOH Screenings   Food Insecurity: Patient Unable To Answer (01/11/2023)  Housing: Patient Unable To Answer (01/11/2023)  Transportation Needs: No Transportation Needs (11/07/2022)  Utilities: Not At Risk (11/07/2022)  Financial Resource Strain: Low Risk  (09/02/2018)  Physical Activity: Sufficiently Active (09/02/2018)  Social Connections: Unknown (09/02/2018)  Stress: No Stress Concern Present (09/02/2018)  Tobacco Use: Medium Risk (01/08/2023)   SDOH Interventions:     Readmission Risk Interventions    01/26/2023   12:06 PM 01/11/2022    2:05 PM 01/09/2022    2:23 PM  Readmission Risk Prevention Plan  Transportation Screening Complete  Complete  HRI or Home Care Consult  Complete   Palliative Care Screening   Not Applicable  Medication Review (RN Care Manager) Complete  Complete  PCP or Specialist appointment within 3-5 days of discharge Complete    HRI or Home Care Consult Complete    SW Recovery Care/Counseling Consult Complete    Palliative Care Screening Not Applicable    Skilled Nursing Facility Complete

## 2023-01-26 NOTE — Plan of Care (Signed)
  Problem: Ischemic Stroke/TIA Tissue Perfusion: Goal: Complications of ischemic stroke/TIA will be minimized Outcome: Progressing   Problem: Clinical Measurements: Goal: Will remain free from infection Outcome: Progressing Goal: Diagnostic test results will improve Outcome: Progressing Goal: Respiratory complications will improve Outcome: Progressing Goal: Cardiovascular complication will be avoided Outcome: Progressing   Problem: Activity: Goal: Risk for activity intolerance will decrease Outcome: Progressing   Problem: Coping: Goal: Level of anxiety will decrease Outcome: Progressing   Problem: Elimination: Goal: Will not experience complications related to bowel motility Outcome: Progressing Goal: Will not experience complications related to urinary retention Outcome: Progressing   Problem: Pain Management: Goal: General experience of comfort will improve Outcome: Progressing   Problem: Safety: Goal: Ability to remain free from injury will improve Outcome: Progressing   Problem: Skin Integrity: Goal: Risk for impaired skin integrity will decrease Outcome: Progressing   Problem: Education: Goal: Knowledge of disease or condition will improve Outcome: Not Progressing Goal: Knowledge of secondary prevention will improve (MUST DOCUMENT ALL) Outcome: Not Progressing Goal: Knowledge of patient specific risk factors will improve Loraine Leriche N/A or DELETE if not current risk factor) Outcome: Not Progressing   Problem: Coping: Goal: Will verbalize positive feelings about self Outcome: Not Progressing Goal: Will identify appropriate support needs Outcome: Not Progressing   Problem: Health Behavior/Discharge Planning: Goal: Ability to manage health-related needs will improve Outcome: Not Progressing Goal: Goals will be collaboratively established with patient/family Outcome: Not Progressing   Problem: Self-Care: Goal: Ability to participate in self-care as condition  permits will improve Outcome: Not Progressing Goal: Verbalization of feelings and concerns over difficulty with self-care will improve Outcome: Not Progressing Goal: Ability to communicate needs accurately will improve Outcome: Not Progressing   Problem: Nutrition: Goal: Risk of aspiration will decrease Outcome: Not Progressing Goal: Dietary intake will improve Outcome: Not Progressing   Problem: Education: Goal: Knowledge of General Education information will improve Description: Including pain rating scale, medication(s)/side effects and non-pharmacologic comfort measures Outcome: Not Progressing   Problem: Health Behavior/Discharge Planning: Goal: Ability to manage health-related needs will improve Outcome: Not Progressing   Problem: Clinical Measurements: Goal: Ability to maintain clinical measurements within normal limits will improve Outcome: Not Progressing   Problem: Nutrition: Goal: Adequate nutrition will be maintained Outcome: Not Progressing

## 2023-01-26 NOTE — Progress Notes (Addendum)
STROKE TEAM PROGRESS NOTE   BRIEF HPI [name] is a 82 y.o. female with history of dementia, hypertension, depression, COPD, CKD, CHF presenting with inability to speak right arm and right side weakness   SIGNIFICANT HOSPITAL EVENTS MRI brain Acute/subacute infarct in the posterior left MCA territory   INTERIM HISTORY/SUBJECTIVE Patient is laying in the bed.  She does not answer orientation questions nor does she follow commands.  She does make comprehensible sounds/words Exam she does appear to have some right side weakness with the right facial droop and decreased sensation on the right side No family at the bedside  OBJECTIVE  CBC    Component Value Date/Time   WBC 10.2 01/26/2023 0415   RBC 3.52 (L) 01/26/2023 0415   HGB 11.1 (L) 01/26/2023 0415   HCT 34.5 (L) 01/26/2023 0415   PLT 356 01/26/2023 0415   MCV 98.0 01/26/2023 0415   MCH 31.5 01/26/2023 0415   MCHC 32.2 01/26/2023 0415   RDW 13.6 01/26/2023 0415   LYMPHSABS 1.3 01/25/2023 1045   MONOABS 0.7 01/25/2023 1045   EOSABS 0.1 01/25/2023 1045   BASOSABS 0.1 01/25/2023 1045    BMET    Component Value Date/Time   NA 146 (H) 01/26/2023 0415   K 3.5 01/26/2023 0415   CL 103 01/26/2023 0415   CO2 30 01/26/2023 0415   GLUCOSE 138 (H) 01/26/2023 0415   BUN 29 (H) 01/26/2023 0415   CREATININE 1.41 (H) 01/26/2023 0415   CALCIUM 9.3 01/26/2023 0415   GFRNONAA 37 (L) 01/26/2023 0415    IMAGING past 24 hours DG Chest Port 1 View  Result Date: 01/26/2023 CLINICAL DATA:  Shortness of breath. EXAM: PORTABLE CHEST 1 VIEW COMPARISON:  01/25/2023 FINDINGS: Lungs are hyperexpanded. Cardiopericardial silhouette is at upper limits of normal for size. Interstitial markings are diffusely coarsened with chronic features. Patchy airspace disease again noted left lung base. Nodular density overlying the right lower lung likely represents focal calcification in anterior costal cartilage seen on recent chest CT. No  substantial pleural effusion. Telemetry leads overlie the chest. IMPRESSION: 1. Hyperexpansion with chronic interstitial coarsening. 2. Patchy airspace disease left lung base, similar to prior. Electronically Signed   By: Kennith Center M.D.   On: 01/26/2023 08:00   DG Knee 1-2 Views Left  Result Date: 01/25/2023 CLINICAL DATA:  Left knee bruising. EXAM: LEFT KNEE - 1-2 VIEW COMPARISON:  None Available. FINDINGS: No fracture or dislocation. Mild medial tibiofemoral joint space narrowing. Mild peripheral spurring. Small quadriceps tendon enthesophyte. No significant knee joint effusion. No erosion or focal bone abnormality. No focal soft tissue abnormalities are seen. IMPRESSION: Mild osteoarthritis. Electronically Signed   By: Narda Rutherford M.D.   On: 01/25/2023 22:58   DG Knee 1-2 Views Right  Result Date: 01/25/2023 CLINICAL DATA:  Bruising on both knees. EXAM: RIGHT KNEE - 1-2 VIEW COMPARISON:  None Available. FINDINGS: No fracture or dislocation. Tricompartmental osteoarthritis most prominent in the medial tibiofemoral compartment. No significant knee joint effusion. No erosions or focal bone abnormality. No focal soft tissue abnormality. 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 Electronically Signed   By: Narda Rutherford M.D.   On: 01/25/2023 22:52   VAS US CAROTID (at [facility] and [facility] only)  Result Date: 01/25/2023 Carotid Arterial Duplex Study Patient Name:  Samantha Carpenter  Date of Exam:   01/25/2023 Medical Rec #: [id number]      Accession #:    [id number] Date of Birth: 12-Oct-1940  Patient Gender: F Patient Age:   60 years Exam Location:  [facility] [place] Procedure:      VAS US CAROTID Referring Phys: Dewitt Hoes DE LA TORRE --------------------------------------------------------------------------------  Indications:       CVA. Risk Factors:      Hypertension. Limitations        Today's exam was limited due to the patient's respiratory                     variation and patient movement and talking. Comparison Study:  No prior exam. Performing Technologist: Fernande Bras  Examination Guidelines: A complete evaluation includes B-mode imaging, spectral Doppler, color Doppler, and power Doppler as needed of all accessible portions of each vessel. Bilateral testing is considered an integral part of a complete examination. Limited examinations for reoccurring indications may be performed as noted.  Right Carotid Findings: +----------+--------+--------+--------+------------------+--------+           PSV cm/sEDV cm/sStenosisPlaque DescriptionComments +----------+--------+--------+--------+------------------+--------+ CCA Prox  88      18                                         +----------+--------+--------+--------+------------------+--------+ CCA Distal80      12                                         +----------+--------+--------+--------+------------------+--------+ ICA Prox  53      16                                         +----------+--------+--------+--------+------------------+--------+ ICA Mid   55      17                                         +----------+--------+--------+--------+------------------+--------+ ICA Distal80      20                                         +----------+--------+--------+--------+------------------+--------+ ECA       123     12                                         +----------+--------+--------+--------+------------------+--------+ +----------+--------+-------+--------+-------------------+           PSV cm/sEDV cmsDescribeArm Pressure (mmHG) +----------+--------+-------+--------+-------------------+ [id number]      11                                 +----------+--------+-------+--------+-------------------+ +---------+--------+--+--------+-+ VertebralPSV cm/s53EDV cm/s9 +---------+--------+--+--------+-+ Limited view of right vertebral. Left  Carotid Findings: +----------+--------+--------+--------+-------------------------+--------+           PSV cm/sEDV cm/sStenosisPlaque Description       Comments +----------+--------+--------+--------+-------------------------+--------+ CCA Prox  122     22                                                +----------+--------+--------+--------+-------------------------+--------+  CCA Distal96      16                                                +----------+--------+--------+--------+-------------------------+--------+ ICA Prox  129     18              heterogenous and calcific         +----------+--------+--------+--------+-------------------------+--------+ ICA Mid   87      22                                                +----------+--------+--------+--------+-------------------------+--------+ ICA Distal103     30                                                +----------+--------+--------+--------+-------------------------+--------+ ECA       395     31                                                +----------+--------+--------+--------+-------------------------+--------+ +----------+--------+--------+--------+-------------------+           PSV cm/sEDV cm/sDescribeArm Pressure (mmHG) +----------+--------+--------+--------+-------------------+ Subclavian101     21                                  +----------+--------+--------+--------+-------------------+ +---------+--------+--+--------+--+ VertebralPSV cm/s96EDV cm/s16 +---------+--------+--+--------+--+   Summary: Right Carotid: Velocities in the right ICA are consistent with a 1-39% stenosis.                Non-hemodynamically significant plaque <50% noted in the CCA. The                ECA appears >50% stenosed. Left Carotid: Velocities in the left ICA are consistent with a 1-39% stenosis.               The ECA appears <50% stenosed. Vertebrals:  Bilateral vertebral arteries demonstrate  antegrade flow. Subclavians: Normal flow hemodynamics were seen in bilateral subclavian              arteries. *See table(s) above for measurements and observations.     Preliminary    ECHOCARDIOGRAM COMPLETE  Result Date: 01/25/2023    ECHOCARDIOGRAM REPORT   Patient Name:   Avalene Huset Date of Exam: 01/25/2023 Medical Rec #:  [id number]     Height:       64.0 in Accession #:    [id number]    Weight:       134.9 lb Date of Birth:  Aug 08, 1940    BSA:          1.655 m Patient Age:    81 years      BP:           163/59 mmHg Patient Gender: F             HR:           69 bpm. Exam Location:  Inpatient Procedure: 2D Echo, Cardiac Doppler and Color Doppler Indications:    Stroke  History:        Patient has prior history of Echocardiogram examinations, most                 recent 04/18/2022. CHF, COPD and CKD, stage 3; Risk                 Factors:Hypertension.  Sonographer:    Lucendia Herrlich RCS Referring Phys: Lennox Solders DE LA TORRE IMPRESSIONS  1. Left ventricular ejection fraction, by estimation, is 70 to 75%. The left ventricle has hyperdynamic function. The left ventricle has no regional wall motion abnormalities. Left ventricular diastolic function could not be evaluated.  2. Right ventricular systolic function is normal. The right ventricular size is normal.  3. The mitral valve is grossly normal. No evidence of mitral valve regurgitation.  4. The aortic valve has an indeterminant number of cusps. Aortic valve regurgitation is not visualized. No aortic stenosis is present.  5. The inferior vena cava is normal in size with greater than 50% respiratory variability, suggesting right atrial pressure of 3 mmHg. Comparison(s): The left ventricular function is unchanged. FINDINGS  Left Ventricle: Left ventricular ejection fraction, by estimation, is 70 to 75%. The left ventricle has hyperdynamic function. The left ventricle has no regional wall motion abnormalities. The left ventricular internal cavity size was  normal in size. There is no left ventricular hypertrophy. Left ventricular diastolic function could not be evaluated. Right Ventricle: The right ventricular size is normal. Right vetricular wall thickness was not assessed. Right ventricular systolic function is normal. Left Atrium: Left atrial size was normal in size. Right Atrium: Right atrial size was normal in size. Pericardium: There is no evidence of pericardial effusion. Mitral Valve: The mitral valve is grossly normal. No evidence of mitral valve regurgitation. Tricuspid Valve: The tricuspid valve is normal in structure. Tricuspid valve regurgitation is trivial. Aortic Valve: The aortic valve has an indeterminant number of cusps. Aortic valve regurgitation is not visualized. No aortic stenosis is present. Aortic valve peak gradient measures 6.9 mmHg. Pulmonic Valve: The pulmonic valve was normal in structure. Pulmonic valve regurgitation is not visualized. Aorta: The aortic root and ascending aorta are structurally normal, with no evidence of dilitation. Venous: The inferior vena cava is normal in size with greater than 50% respiratory variability, suggesting right atrial pressure of 3 mmHg. IAS/Shunts: No atrial level shunt detected by color flow Doppler.  LEFT VENTRICLE PLAX 2D LVIDd:         4.20 cm   Diastology LVIDs:         2.80 cm   LV e' medial:    4.24 cm/s LV PW:         0.80 cm   LV E/e' medial:  18.5 LV IVS:        0.80 cm   LV e' lateral:   3.92 cm/s LVOT diam:     1.80 cm   LV E/e' lateral: 20.0 LV SV:         49 LV SV Index:   29 LVOT Area:     2.54 cm  RIGHT VENTRICLE             IVC RV S prime:     11.70 cm/s  IVC diam: 1.60 cm TAPSE (M-mode): 2.8 cm LEFT ATRIUM             Index        RIGHT ATRIUM  Index LA diam:        2.70 cm 1.63 cm/m   RA Area:     11.10 cm LA Vol (A2C):   30.0 ml 18.13 ml/m  RA Volume:   22.00 ml  13.29 ml/m LA Vol (A4C):   26.1 ml 15.77 ml/m LA Biplane Vol: 31.2 ml 18.85 ml/m  AORTIC VALVE AV Area  (Vmax): 1.96 cm AV Vmax:        131.00 cm/s AV Peak Grad:   6.9 mmHg LVOT Vmax:      101.00 cm/s LVOT Vmean:     64.300 cm/s LVOT VTI:       0.191 m  AORTA Ao Root diam: 3.10 cm Ao Asc diam:  2.80 cm MITRAL VALVE               TRICUSPID VALVE MV Area (PHT): 2.45 cm    TR Peak grad:   4.9 mmHg MV Decel Time: 310 msec    TR Vmax:        111.00 cm/s MV E velocity: 78.40 cm/s MV A velocity: 97.70 cm/s  SHUNTS MV E/A ratio:  0.80        Systemic VTI:  0.19 m                            Systemic Diam: 1.80 cm Dietrich Pates MD Electronically signed by Dietrich Pates MD Signature Date/Time: 01/25/2023/4:52:48 PM    Final    EEG adult  Result Date: 01/25/2023 Charlsie Quest, MD     01/25/2023  4:52 PM Patient Name: Vismaya Trimboli MRN: [id number] Epilepsy Attending: Charlsie Quest Referring Physician/Provider: Ernestina Columbia, Lennox Solders, N Date: 01/25/2023 Duration: 25.40 mins Patient history: 82yo F with right sided weakness getting eeg to evaluate for seizure Level of alertness: Awake AEDs during EEG study: None Technical aspects: This EEG study was done with scalp electrodes positioned according to the 10-20 International system of electrode placement. Electrical activity was reviewed with band pass filter of 1-70Hz , sensitivity of 7 uV/mm, display speed of 60mm/sec with a 60Hz  notched filter applied as appropriate. EEG data were recorded continuously and digitally stored.  Video monitoring was available and reviewed as appropriate. Description: The posterior dominant rhythm consists of 8 Hz activity of moderate voltage (25-35 uV) seen predominantly in posterior head regions, symmetric and reactive to eye opening and eye closing. EEG showed intermittent generalized 3 to 6 Hz theta-delta slowing. Sharp transients were noted in left>right posterior quadrant. Hyperventilation and photic stimulation were not performed.   Patient was noted to have chewing like movements intermittently. Concomitant eeg before, during and after the  event didn't show any eeg change to suggest seizure. ABNORMALITY - Intermittent slow, generalized IMPRESSION: This study is suggestive of mild diffuse encephalopathy. No seizures or definite epileptiform discharges were seen throughout the recording. Patient was noted to have chewing like movements intermittently without concomitant eeg change. These events are non-epileptic. Charlsie Quest   MR BRAIN WO CONTRAST  Result Date: 01/25/2023 CLINICAL DATA:  Stroke, follow-up. EXAM: MRI HEAD WITHOUT CONTRAST MRA HEAD WITHOUT CONTRAST TECHNIQUE: Multiplanar, multi-echo pulse sequences of the brain and surrounding structures were acquired without intravenous contrast. Angiographic images of the Circle of Willis were acquired using MRA technique without intravenous contrast. COMPARISON:  Head CT January 25, 2023. FINDINGS: MRI HEAD FINDINGS Brain: Confluent areas of restricted diffusion involving the insula, posterior frontal and parietal lobes, consistent with acute/subacute infarct in the  posterior left MCA territory correlating with areas of hypodensity seen on recent head CT. No hemorrhagic transformation or significant mass effect. Scattered confluent foci of T2 hyperintensity are seen within the white matter of the cerebral hemispheres, nonspecific, most likely related to chronic small vessel ischemia. Mild parenchymal volume loss. Vascular: Susceptibility artifact is seen in the anterior left sylvian fissure and left parietal sulcus, most likely related to vessel thrombosis. Skull and upper cervical spine: Normal marrow signal. Sinuses/Orbits: Bilateral lens surgery. Paranasal sinuses are essentially clear. Bilateral mastoid effusion. Other: None. MRA HEAD FINDINGS Anterior circulation: Normal caliber and flow related enhancement of the bilateral intracranial ICAs. Focus of loss of flow related enhancement is seen in the left MCA bifurcation (series 3, image 95), may represent nonocclusive thrombus. Loss of  flow related enhancement is also seen a proximal left M2/MCA posterior branch with reconstitution of flow at the distal M2 segment and subsequent loss of flow at the M3 level. The right MCA vascular tree remains patent. The azygous ACA has normal caliber and flow related enhancement. Posterior circulation: Hypoplastic right vertebral artery supplying primarily the right PICA with minimal flow to the basilar artery. The dominant left vertebral artery has normal caliber flow related enhancement. Normal caliber and flow related enhancement of the basilar artery and right posterior cerebral artery. Diffusely small caliber of the P1 and proximal P2 segment of the fetal left PCA which remains patent. Anatomic variants: Hypoplastic left A1/ACA with azygous A2/ACA segment. Fetal left PCA. IMPRESSION: 1. Acute/subacute infarct in the posterior left MCA territory correlating with areas of hypodensity seen on recent head CT. No hemorrhagic transformation or significant mass effect. 2. Loss of flow related enhancement in the left MCA bifurcation may represent nonocclusive thrombus. 3. 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 4. Diffusely small caliber of the P1 and proximal P2 segment of the fetal left PCA which remains patent. 5. Moderate chronic microvascular ischemic changes of the white matter. Electronically Signed   By: Baldemar Lenis M.D.   On: 01/25/2023 13:59   MR ANGIO HEAD WO CONTRAST  Result Date: 01/25/2023 CLINICAL DATA:  Stroke, follow-up. EXAM: MRI HEAD WITHOUT CONTRAST MRA HEAD WITHOUT CONTRAST TECHNIQUE: Multiplanar, multi-echo pulse sequences of the brain and surrounding structures were acquired without intravenous contrast. Angiographic images of the Circle of Willis were acquired using MRA technique without intravenous contrast. COMPARISON:  Head CT January 25, 2023. FINDINGS: MRI HEAD FINDINGS Brain: Confluent areas of restricted diffusion involving the insula, posterior frontal  and parietal lobes, consistent with acute/subacute infarct in the posterior left MCA territory correlating with areas of hypodensity seen on recent head CT. No hemorrhagic transformation or significant mass effect. Scattered confluent foci of T2 hyperintensity are seen within the white matter of the cerebral hemispheres, nonspecific, most likely related to chronic small vessel ischemia. Mild parenchymal volume loss. Vascular: Susceptibility artifact is seen in the anterior left sylvian fissure and left parietal sulcus, most likely related to vessel thrombosis. Skull and upper cervical spine: Normal marrow signal. Sinuses/Orbits: Bilateral lens surgery. Paranasal sinuses are essentially clear. Bilateral mastoid effusion. Other: None. MRA HEAD FINDINGS Anterior circulation: Normal caliber and flow related enhancement of the bilateral intracranial ICAs. Focus of loss of flow related enhancement is seen in the left MCA bifurcation (series 3, image 95), may represent nonocclusive thrombus. Loss of flow related enhancement is also seen a proximal left M2/MCA posterior branch with reconstitution of flow at the distal M2 segment and subsequent loss of flow at the  M3 level. The right MCA vascular tree remains patent. The azygous ACA has normal caliber and flow related enhancement. Posterior circulation: Hypoplastic right vertebral artery supplying primarily the right PICA with minimal flow to the basilar artery. The dominant left vertebral artery has normal caliber flow related enhancement. Normal caliber and flow related enhancement of the basilar artery and right posterior cerebral artery. Diffusely small caliber of the P1 and proximal P2 segment of the fetal left PCA which remains patent. Anatomic variants: Hypoplastic left A1/ACA with azygous A2/ACA segment. Fetal left PCA. IMPRESSION: 1. Acute/subacute infarct in the posterior left MCA territory correlating with areas of hypodensity seen on recent head CT. No  hemorrhagic transformation or significant mass effect. 2. Loss of flow related enhancement in the left MCA bifurcation may represent nonocclusive thrombus. 3. 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 4. Diffusely small caliber of the P1 and proximal P2 segment of the fetal left PCA which remains patent. 5. Moderate chronic microvascular ischemic changes of the white matter. Electronically Signed   By: Baldemar Lenis M.D.   On: 01/25/2023 13:59   DG Chest Portable 1 View  Result Date: 01/25/2023 CLINICAL DATA:  Altered mental status. EXAM: PORTABLE CHEST 1 VIEW COMPARISON:  January 10, 2023. FINDINGS: The heart size and mediastinal contours are within normal limits. Both lungs are clear. Moderately displaced distal left clavicular fracture is again noted. IMPRESSION: No acute cardiopulmonary abnormality seen. Moderately displaced distal left clavicular fracture. Electronically Signed   By: Lupita Raider M.D.   On: 01/25/2023 13:30    Vitals:   01/26/23 0004 01/26/23 0008 01/26/23 0423 01/26/23 0833  BP: (!) 150/57  (!) 152/62 (!) 199/61  Pulse:  79 81 90  Resp: (!) 22 (!) 24 (!) 31 (!) 25  Temp:   99.5 F (37.5 C) 98.6 F (37 C)  TempSrc:   Axillary Oral  SpO2:  100% 100% 100%  Weight:         PHYSICAL EXAM General: Frail elderly woman in no apparent distress Psych:  Mood and affect appropriate for situation CV: Regular rate and rhythm on monitor Respiratory:  Regular, unlabored respirations on room air GI: Abdomen soft and nontender   NEURO:  Mental Status: She is awake and alert, aphasic, does not answer orientation questions or follow commands.  Minimal verbal output which is incomprehensible  Cranial Nerves:  II: PERRL.  Tracks examiner III, IV, VI: EOMI. Eyelids elevate symmetrically.  V: Sensation is intact to light touch and symmetrical to face.  VII: Subtle right facial droop VIII: hearing intact to voice. IX, X: Palate elevates symmetrically.  Phonation is normal.  UU:[id number] shrug 5/5. XII: tongue is midline without fasciculations. Motor: Moves all extremities spontaneously, right arm and leg appears to be weaker than left side Tone: is normal and bulk is normal Sensation-appears to be diminished on the right side of the body Coordination: Unable to assess Gait- deferred  ASSESSMENT/PLAN  Acute Ischemic Infarct:  left MCA infarct, etiology unclear, concerning for cardioembolic source Code Stroke  CT head Acute to early subacute nonhemorrhagic left MCA infarcts  ASPECTS 7   MRI  Acute/subacute infarct in the posterior left MCA territory.   MRA loss of flow related enhancement in the left MCA bifurcation may represent nonocclusive thrombus.  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  Diffusely small caliber of the P1 and proximal P2 segment of the fetal left PCA which remains patent Carotid Doppler bilateral ICA unremarkable 2D Echo  EF 70 to 75% LE venous Doppler pending LDL 78 HgbA1c 6.6 UDS negative VTE prophylaxis -Lovenox aspirin 81 mg daily prior to admission, now on aspirin 300 mg suppository daily Therapy recommendations: SNF Disposition: Pending, palliative care consulted  Hypertension Home meds: Zebeta 5 mg, losartan 50 mg Stable on the high end Resume home meds once po access Gradually normalize BP in 2 to 3 days Long-term BP goal normotensive  Hyperlipidemia Home meds: None LDL 78, goal < 70 Add Crestor 5 once p.o. access if continue aggressive care Continue statin at discharge  Dementia with behavioral issues Delirium precautions On home Klonopin, Haldol, Trileptal, exelon, Zyprexa  Dysphagia Patient has post-stroke dysphagia SLP consulted Currently n.p.o. On IV fluid  Other Stroke Risk Factors Advanced age  Other Active Problems COPD CKD 3A, creatinine 1.40 Clavicle fracture due to fall Recent pneumonia, on Unasyn  Hospital day # 0  Gevena Mart DNP, ACNPC-AG  [facility]  ATTENDING NOTE: I reviewed above note and agree with the assessment and plan. Pt was seen and examined.   RN at bedside.  Patient lying bed in sideways, eyes open, tracking bilaterally, however not following commands, minimal verbal output but insensible.  Blinking to visual threat bilaterally, mild right facial droop.  Left upper extremity no drift, right upper extremity not against gravity with increased tone on flexion.  Bilateral lower extremity able to hold knee flexion with foot on bed position, but mild drift on the right with increased muscle tone. Sensation, coordination and gait not tested.  Patient left MCA stroke could be due to left MCA stenosis versus occlusion, but also could be due to cardiac embolic source.  Currently on aspirin 300 PR due to n.p.o. status.  Resume home BP meds once p.o. access.  Continue antibiotic for pneumonia treatment.  Will follow.  For detailed assessment and plan, please refer to above/below as I have made changes wherever appropriate.   Marvel Plan, MD PhD Stroke Neurology 01/26/2023 4:17 PM    To contact Stroke Continuity provider, please refer to [email or web address]. After hours, contact General Neurology

## 2023-01-27 ENCOUNTER — Inpatient Hospital Stay (HOSPITAL_COMMUNITY): Payer: Medicare HMO

## 2023-01-27 DIAGNOSIS — Z515 Encounter for palliative care: Secondary | ICD-10-CM

## 2023-01-27 DIAGNOSIS — I639 Cerebral infarction, unspecified: Secondary | ICD-10-CM

## 2023-01-27 DIAGNOSIS — Z7189 Other specified counseling: Secondary | ICD-10-CM

## 2023-01-27 DIAGNOSIS — I63512 Cerebral infarction due to unspecified occlusion or stenosis of left middle cerebral artery: Secondary | ICD-10-CM | POA: Diagnosis not present

## 2023-01-27 LAB — CBC WITH DIFFERENTIAL/PLATELET
Abs Immature Granulocytes: 0.04 10*3/uL (ref 0.00–0.07)
Basophils Absolute: 0 10*3/uL (ref 0.0–0.1)
Basophils Relative: 0 %
Eosinophils Absolute: 0.1 10*3/uL (ref 0.0–0.5)
Eosinophils Relative: 2 %
HCT: 34.3 % — ABNORMAL LOW (ref 36.0–46.0)
Hemoglobin: 10.8 g/dL — ABNORMAL LOW (ref 12.0–15.0)
Immature Granulocytes: 1 %
Lymphocytes Relative: 10 %
Lymphs Abs: 0.8 10*3/uL (ref 0.7–4.0)
MCH: 30.8 pg (ref 26.0–34.0)
MCHC: 31.5 g/dL (ref 30.0–36.0)
MCV: 97.7 fL (ref 80.0–100.0)
Monocytes Absolute: 0.5 10*3/uL (ref 0.1–1.0)
Monocytes Relative: 6 %
Neutro Abs: 6.7 10*3/uL (ref 1.7–7.7)
Neutrophils Relative %: 81 %
Platelets: 305 10*3/uL (ref 150–400)
RBC: 3.51 MIL/uL — ABNORMAL LOW (ref 3.87–5.11)
RDW: 13.6 % (ref 11.5–15.5)
WBC: 8.2 10*3/uL (ref 4.0–10.5)
nRBC: 0 % (ref 0.0–0.2)

## 2023-01-27 LAB — MAGNESIUM: Magnesium: 1.9 mg/dL (ref 1.7–2.4)

## 2023-01-27 LAB — BASIC METABOLIC PANEL
Anion gap: 14 (ref 5–15)
BUN: 23 mg/dL (ref 8–23)
CO2: 28 mmol/L (ref 22–32)
Calcium: 8.9 mg/dL (ref 8.9–10.3)
Chloride: 105 mmol/L (ref 98–111)
Creatinine, Ser: 1.19 mg/dL — ABNORMAL HIGH (ref 0.44–1.00)
GFR, Estimated: 45 mL/min — ABNORMAL LOW (ref >60)
Glucose, Bld: 141 mg/dL — ABNORMAL HIGH (ref 70–99)
Potassium: 3.4 mmol/L — ABNORMAL LOW (ref 3.5–5.1)
Sodium: 147 mmol/L — ABNORMAL HIGH (ref 135–145)

## 2023-01-27 LAB — BRAIN NATRIURETIC PEPTIDE: B Natriuretic Peptide: 689.5 pg/mL — ABNORMAL HIGH (ref 0.0–100.0)

## 2023-01-27 LAB — PROCALCITONIN: Procalcitonin: <0.1 ng/mL

## 2023-01-27 LAB — GLUCOSE, CAPILLARY
Glucose-Capillary: 125 mg/dL — ABNORMAL HIGH (ref 70–99)
Glucose-Capillary: 154 mg/dL — ABNORMAL HIGH (ref 70–99)
Glucose-Capillary: 151 mg/dL — ABNORMAL HIGH (ref 70–99)
Glucose-Capillary: 132 mg/dL — ABNORMAL HIGH (ref 70–99)

## 2023-01-27 LAB — C-REACTIVE PROTEIN: CRP: 8.1 mg/dL — ABNORMAL HIGH (ref <1.0)

## 2023-01-27 MED ORDER — DEXTROSE 5 % IV SOLN: INTRAVENOUS | Status: DC

## 2023-01-27 MED ORDER — LACTULOSE 10 GM/15ML PO SOLN: 30.0000 g | Freq: Two times a day (BID) | ORAL | Status: DC

## 2023-01-27 MED ORDER — ROSUVASTATIN CALCIUM 5 MG PO TABS
10.0000 mg | ORAL_TABLET | Freq: Every day | ORAL | Status: DC
  Filled 2023-01-27: qty 2

## 2023-01-27 MED ORDER — MAGNESIUM HYDROXIDE 400 MG/5ML PO SUSP: 30.0000 mL | Freq: Two times a day (BID) | ORAL | Status: DC

## 2023-01-27 MED ORDER — POTASSIUM CHLORIDE 10 MEQ/100ML IV SOLN
10.0000 meq | INTRAVENOUS | Status: AC
  Administered 2023-01-27 (×4): 10 meq via INTRAVENOUS
  Filled 2023-01-27 (×4): qty 100

## 2023-01-27 MED ORDER — OLANZAPINE 5 MG PO TBDP
5.0000 mg | ORAL_TABLET | Freq: Two times a day (BID) | ORAL | Status: DC
  Administered 2023-01-27 – 2023-01-30 (×6): 5 mg via ORAL
  Filled 2023-01-27 (×6): qty 1

## 2023-01-27 MED ORDER — LOSARTAN POTASSIUM 50 MG PO TABS
50.0000 mg | ORAL_TABLET | Freq: Every day | ORAL | Status: DC
  Filled 2023-01-27: qty 1

## 2023-01-27 MED ORDER — POLYETHYLENE GLYCOL 3350 17 G PO PACK
17.0000 g | PACK | Freq: Two times a day (BID) | ORAL | Status: DC
Start: 2023-01-27 — End: 2023-01-30
  Filled 2023-01-27: qty 1

## 2023-01-27 MED ORDER — CLOPIDOGREL BISULFATE 75 MG PO TABS
75.0000 mg | ORAL_TABLET | Freq: Every day | ORAL | Status: DC
  Filled 2023-01-27: qty 1

## 2023-01-27 MED ORDER — OLANZAPINE 5 MG PO TABS: 5.0000 mg | ORAL_TABLET | Freq: Two times a day (BID) | ORAL | Status: DC

## 2023-01-27 MED ORDER — POTASSIUM CHLORIDE CRYS ER 20 MEQ PO TBCR: 40.0000 meq | EXTENDED_RELEASE_TABLET | Freq: Once | ORAL | Status: DC

## 2023-01-27 NOTE — Plan of Care (Signed)
  Problem: Health Behavior/Discharge Planning: Goal: Goals will be collaboratively established with patient/family Outcome: Progressing   Problem: Education: Goal: Knowledge of disease or condition will improve Outcome: Not Met (add Reason)   Problem: Ischemic Stroke/TIA Tissue Perfusion: Goal: Complications of ischemic stroke/TIA will be minimized Outcome: Not Met (add Reason)   Problem: Self-Care: Goal: Ability to participate in self-care as condition permits will improve Outcome: Not Met (add Reason)   Problem: Nutrition: Goal: Risk of aspiration will decrease Outcome: Not Met (add Reason) Goal: Dietary intake will improve Outcome: Not Met (add Reason)

## 2023-01-27 NOTE — Consult Note (Signed)
Palliative Medicine Inpatient Consult Note  Consulting Provider:  Leroy Sea, MD    Reason for consult:   Palliative Care Consult Services Palliative Medicine Consult  Reason for Consult? Recurrent hospital admissions with respiratory failure, encephalopathy, goals of care   01/27/2023  HPI:  Per intake H&P --> 82 y.o. female with medical history significant of hypertension, hyperlipidemia, COPD, chronic diastolic congestive heart failure, CKD stage IIIa, dementia, and frequent falls who presented with complaints of difficulty speaking and right-sided weakness.  At baseline she could talk, walk without assistance although recommended to use walk, and care for herself.  She had just recently been hospitalized from 10/22-11/7 with acute hypoxic and hypercapnic respiratory failure thought secondary to COPD exacerbation and community acquired pneumonia.    She now presented to the hospital with high fevers, right-sided weakness, left-sided clavicular fracture, was diagnosed with pneumonia along with acute left MCA stroke.  She also developed severe encephalopathy and was admitted.  Palliative care asked to get involved to further support goals of care conversations.   Clinical Assessment/Goals of Care:  *Please note that this is a verbal dictation therefore any spelling or grammatical errors are due to the "Dragon Medical One" system interpretation.  I have reviewed medical records including EPIC notes, labs and imaging, received report from bedside RN, assessed the patient.    I met with patient's nurse from [facility], [facility] and communicated with patient's sister, Carney Bern on speaker phone to further discuss diagnosis prognosis, GOC, EOL wishes, disposition and options.   I introduced Palliative Medicine as specialized medical care for people living with serious illness. It focuses on providing relief from the symptoms and stress of a serious illness. The  goal is to improve quality of life for both the patient and the family.  Medical History Review and Understanding:  Reviewed patient's history significant for COPD, congestive heart failure, CKD, dementia, hypertension, and hyperlipidemia.  Social History:  Lafayette presently lives in [place].  Throughout her life she has lived in both [place] as well as [place].  She was married and had 1 adopted son.  Her husband passed away few years ago and patient's son is sadly imprisoned.  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  Per her sister she was extremely socially active, fun-loving, and strong-willed.  She is a woman of the [place] faith.  Functional and Nutritional State:  Preceding hospitalization Anelie was living at a memory care facility [facility] [address]).  She was able to mobilize throughout the facility with her walker.  She would take part in most of the activities with other seniors.  She was able to feed herself.She did require help with bathing, dressing, and medication passage.  Advance Directives:  A detailed discussion was had today regarding advanced directives.  Patient's sister, [name] "Carney Bern" is her Runner, broadcasting/film/video.  Code Status:  Concepts specific to code status, artifical feeding and hydration, continued IV antibiotics and rehospitalization was had.  The difference between a aggressive medical intervention path  and a palliative comfort care path for this patient at this time was had.   Encouraged patient/family to consider DNR/DNI status understanding evidenced based poor outcomes in similar hospitalized patient, as the cause of arrest is likely associated with advanced chronic/terminal illness rather than an easily reversible acute cardio-pulmonary event. I explained that DNR/DNI does not change the medical plan and it only comes into effect after a person has arrested (died).  It is a protective measure to  keep [place] from harming the patient in their last  moments of life.  Carney Bern was agreeable to DNR/DNI with understanding that patient would not receive CPR, defibrillation, ACLS medications, or intubation.   Erskine Squibb shares that Astaria never wanted to be on life support machines to maintain living.  Discussion:  Open and honest conversations held in the settings of patient's acute on chronic disease burden.  Discussed patient's recent hospitalization from October 22 to November 7 in the setting of pneumonia/COPD.  Patient had gone over to skilled nursing and apparently had been improving though the family acknowledges she was having some facial droop and seemed more lethargic.  We discussed since being hospitalized the patient is identified to his suffered a left MCA infarction.  We reviewed the impact strokes have on elderly patients and how with age recovery can be more difficult.  Patient's sister and nurse both feel strongly that they would want to allow [place]illa the opportunity to improve if she can.  Patient's sister shares she is seen on many occasions that individuals are identified to have "a poor prognosis" and they improve from their health state.  I shared it is very reasonable to allow time for outcomes but provided insight on the short-term and long-term effects again of middle cerebral infarctions on geriatric patients who already suffer from dementia.  We reviewed that she may not have the same quality of life after such an event as she did before.    We did discuss should Jule not improve to meaningful quality of life transitioning her focus to comfort care with involvement of hospice.   Patient's sister did understand this though remains firm on allowing more time for outcomes.  Discussed the importance of continued conversation with family and their  medical providers regarding overall plan of care and treatment options, ensuring decisions are within the context of the patients values and GOCs.  Decision Maker: Jackie Plum  (Sister): [phone] (Mobile)   SUMMARY OF RECOMMENDATIONS   DNAR/DNI  Open and honest conversations held with patient's sister regarding possible outcomes after and during a left MCA infarct  Patient's sister would like to allow time for outcomes  Patient's family would like to proceed with modified barium swallow study  Ongoing palliative supportive care  Code Status/Advance Care Planning: DNAR/DNI  Palliative Prophylaxis:  Aspiration, Bowel Regimen, Delirium Protocol, Frequent Pain Assessment, Oral Care, Palliative Wound Care, and Turn Reposition  Additional Recommendations (Limitations, Scope, Preferences): Continue current care  Psycho-social/Spiritual:  Desire for further Chaplaincy support: Yes patient has a strong [place] Additional Recommendations: Education on strokes in the elderly   Prognosis: Patient was apparently decently functioning preceding stroke though had a prolonged hospitalization prior to this, has high chronic disease burden, has the need for assistance with B ADLs, all of these things taken into consideration increase patient's 38-month mortality risk.  Discharge Planning: Discharge plan is uncertain at this time  Vitals:   01/27/23 0451 01/27/23 0504  BP:  (!) 187/65  Pulse: 87   Resp: (!) 40   Temp:    SpO2: 99%     Intake/Output Summary (Last 24 hours) at 01/27/2023 0754 Last data filed at 01/27/2023 0631 Gross per 24 hour  Intake --  Output 300 ml  Net -300 ml   Last Weight  Most recent update: 01/25/2023 10:46 AM    Weight  61.2 kg (134 lb 14.7 oz)            Gen: Elderly Caucasian female in moderate distress or  moaning HEENT: Dry mucous membranes CV: Regular rate and rhythm  PULM: On 2 L a minute nasal cannula breathing is even and nonlabored ABD: soft/nontender  EXT: No edema  Neuro: Not responsive  PPS: 20%   This conversation/these recommendations were discussed with patient primary care team, Dr. Thedore Mins  Billing  based on MDM: High  Problems Addressed: One acute or chronic illness or injury that poses a threat to life or bodily function  Amount and/or Complexity of Data: Category 3:Discussion of management or test interpretation with external physician/other qualified health care professional/appropriate source (not separately reported)  Risks: Decision regarding hospitalization or escalation of hospital care and Decision not to resuscitate or to de-escalate care because of poor prognosis ______________________________________________________ [name] [facility] Palliative Medicine Team Team Cell Phone: [phone] Please utilize secure chat with additional questions, if there is no response within 30 minutes please call the above phone number  Palliative Medicine Team providers are available by phone from 7am to 7pm daily and can be reached through the team cell phone.  Should this patient require assistance outside of these hours, please call the patient's attending physician.

## 2023-01-27 NOTE — Progress Notes (Signed)
Bilateral lower extremity venous duplex has been completed. Preliminary results can be found in CV Proc through chart review.   01/27/23 12:25 PM Olen Cordial RVT

## 2023-01-27 NOTE — Progress Notes (Signed)
Speech Language Pathology Treatment: Dysphagia  Patient Details Name: Samantha Carpenter MRN: [id number] DOB: [birth year] Today's Date: 01/27/2023 Time: 4098-1191 SLP Time Calculation (min) (ACUTE ONLY): 35 min  Assessment / Plan / Recommendation Clinical Impression  Pt seen after family meeting with Palliative Care. Pts family reportedly would like to pursue all interventions, including MBS if needed. Family friend and caregiver is at bedside and has been communicating with family via telephone. Pt initially poor responsive, contracted in bed with pooled secretions visible. SLP repositioned pt into chair position, washed face, cleaned mouth. Pt opened eyes and more attentive, attempting to communicate with friend though her speech is unintelligible, characterized by stereotypic utterances. Pt was unable to sip from a straw, but when PO was introduced with careful total assisted spoon feeding she briefly held bolus and swallowed without signs of aspiration. She has ongoing left oral residue and anterior spillage. Significant effort needed for minimal intake. Pt is not ready for MBS, needs to progress to ability to take more than a teaspoon. Trained caregiver in careful spoon feeding of full liquids (applesauce, pudding, ensure, water, ice cream) and encouraged upright posture, oral care prior to laying down and increased activity today. Hopeful that oral ability and cognition will improve for diet initiation potentially tomorrow. MBS deferred for now, will reassess tomorrow.    HPI HPI: HPI: Samantha Carpenter is a 82 y.o. female with medical history significant of hypertension, hyperlipidemia, COPD, chronic diastolic congestive heart failure, CKD stage IIIa, dementia, and frequent falls who presented with complaints of difficulty speaking and right-sided weakness.  At baseline she could talk, walk without assistance although recommended to use walk, and care for herself.  She had just recently been hospitalized  from 10/22-11/7 with acute hypoxic and hypercapnic respiratory failure thought secondary to COPD exacerbation and community acquired pneumonia.  There was concern for acute on chronic diastolic heart failure and patient was temporarily treated with Lasix.  Prior to the patient being transferred to the rehab facility her family had question if she had had a stroke because she seemed drastically different from baseline and had been having difficulty feeding herself which was unusual.  They had checked a CT scan of her head which was noted to be negative.  After she got to the facility due to patient having significant difficulty speaking and weakness on her right side sent her to the hospital for further evaluation due to concern for stroke.     In the emergency department patient was seen as a code stroke.  Noted to be febrile up to 101.1 F with tachypnea.  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  Patient was noted not to be a thrombolytic candidate as unclear time of onset pressures elevated up to 189/58 all other vital signs maintained.  Labs significant for WBC 8.6, hemoglobin 10.6, sodium 146, BUN 29, creatinine 1.36, glucose 167.  Chest x-ray noted no acute cardiopulmonary abnormality but a moderately displaced distal left clavicle fracture. MRI of the brain significant for acute or subacute infarct in the posterior left MCA territory correlating with CT imaging.      SLP Plan  Continue with current plan of care      Recommendations for follow up therapy are one component of a multi-disciplinary discharge planning process, led by the attending physician.  Recommendations may be updated based on patient status, additional functional criteria and insurance authorization.    Recommendations  Diet recommendations: Thin liquid Liquids provided via: Teaspoon Medication Administration: Crushed  with puree Supervision: Staff to assist with self feeding;Full  supervision/cueing for compensatory strategies Compensations: Minimize environmental distractions;Slow rate;Small sips/bites;Follow solids with liquid Postural Changes and/or Swallow Maneuvers: Seated upright 90 degrees                              Continue with current plan of care     Ithzel Fedorchak, Riley Nearing  01/27/2023, 9:49 AM

## 2023-01-27 NOTE — Progress Notes (Signed)
STROKE TEAM PROGRESS NOTE   BRIEF HPI [name] is a 82 y.o. female with history of dementia, hypertension, depression, COPD, CKD, CHF presenting with inability to speak right arm and right side weakness   SIGNIFICANT HOSPITAL EVENTS MRI brain Acute/subacute infarct in the posterior left MCA territory   INTERIM HISTORY/SUBJECTIVE Daughter is in the hallway and vascular tech is doing the LE venous doppler. Pt lying in bed, eyes open, awake alert, still minimal language output and not following commands. Right hemiparesis. No significant neuro change. Palliative care on board for GOC discussion.   OBJECTIVE  CBC    Component Value Date/Time   WBC 8.2 01/27/2023 0445   RBC 3.51 (L) 01/27/2023 0445   HGB 10.8 (L) 01/27/2023 0445   HCT 34.3 (L) 01/27/2023 0445   PLT 305 01/27/2023 0445   MCV 97.7 01/27/2023 0445   MCH 30.8 01/27/2023 0445   MCHC 31.5 01/27/2023 0445   RDW 13.6 01/27/2023 0445   LYMPHSABS 0.8 01/27/2023 0445   MONOABS 0.5 01/27/2023 0445   EOSABS 0.1 01/27/2023 0445   BASOSABS 0.0 01/27/2023 0445    BMET    Component Value Date/Time   NA 147 (H) 01/27/2023 0445   K 3.4 (L) 01/27/2023 0445   CL 105 01/27/2023 0445   CO2 28 01/27/2023 0445   GLUCOSE 141 (H) 01/27/2023 0445   BUN 23 01/27/2023 0445   CREATININE 1.19 (H) 01/27/2023 0445   CALCIUM 8.9 01/27/2023 0445   GFRNONAA 45 (L) 01/27/2023 0445    IMAGING past 24 hours DG Chest Port 1 View  Result Date: 01/27/2023 CLINICAL DATA:  Shortness of breath. EXAM: PORTABLE CHEST 1 VIEW COMPARISON:  January 26, 2023. FINDINGS: The heart size and mediastinal contours are within normal limits. Right lung is clear. Minimal left basilar subsegmental atelectasis or infiltrate is noted. The visualized skeletal structures are unremarkable. IMPRESSION: Minimal left basilar subsegmental atelectasis or infiltrate. Electronically Signed   By: Lupita Raider M.D.   On: 01/27/2023 08:03    Vitals:   01/27/23 0450  01/27/23 0451 01/27/23 0504 01/27/23 0801  BP: (!) 153/61  (!) 187/65 (!) 156/57  Pulse: 88 87  92  Resp: (!) 25 (!) 40  20  Temp: (!) 100.5 F (38.1 C)   99.8 F (37.7 C)  TempSrc: Oral   Axillary  SpO2: 98% 99%  98%  Weight:         PHYSICAL EXAM General: Frail elderly woman in no apparent distress Psych:  Mood and affect appropriate for situation CV: Regular rate and rhythm on monitor Respiratory:  Regular, unlabored respirations on room air GI: Abdomen soft and nontender   NEURO:  Awake alert, eyes open, tracking bilaterally, however not following commands, minimal verbal output but insensible.  Blinking to visual threat bilaterally, mild right facial droop.  Left upper extremity no drift, right upper extremity not against gravity with increased tone on flexion.  Bilateral lower extremity able to hold knee flexion with foot on bed position, but mild drift on the right with increased muscle tone. Sensation, coordination and gait not tested. 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   ASSESSMENT/PLAN  Acute Ischemic Infarct:  left MCA infarct, etiology unclear, concerning for cardioembolic source Code Stroke  CT head Acute to early subacute nonhemorrhagic left MCA infarcts  ASPECTS 7   MRI  Acute/subacute infarct in the posterior left MCA territory.   MRA loss of flow related enhancement in the left MCA bifurcation  may represent nonocclusive thrombus.  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  Diffusely small caliber of the P1 and proximal P2 segment of the fetal left PCA which remains patent Carotid Doppler bilateral ICA unremarkable 2D Echo EF 70 to 75% LE venous Doppler no DVT Recommend 30 day cardiac event monitoring as outpt to rule out afib if continue aggressive care LDL 78 HgbA1c 6.6 UDS negative VTE prophylaxis -Lovenox aspirin 81 mg daily prior to admission, now on ASA 81 and plavix DAPT for 3 months and then palvix  alone given M2/M3 occlusion. Therapy recommendations: SNF Disposition: Pending, palliative care on board for GOC discussion  Hypertension Home meds: Zebeta 5 mg, losartan 50 mg Stable on the high end On hydralazine IV PRN Resumed home meds  Gradually normalize BP in 2 to 3 days Long-term BP goal normotensive  Hyperlipidemia Home meds: None LDL 78, goal < 70 Add Crestor 10mg  No high intensity statin due to advanced age and LDL near goal Continue statin at discharge  Dementia with behavioral issues Delirium precautions On home Klonopin, Haldol, Trileptal, exelon, Zyprexa  Dysphagia Patient has post-stroke dysphagia SLP consulted OK with full liquid, thin liquid and meds with puree On gentle IV fluid  Other Stroke Risk Factors Advanced age  Other Active Problems COPD CKD 3A, creatinine 1.40->1.19 Clavicle fracture due to fall Recent pneumonia, on Unasyn  Hospital day # 1  Neurology will sign off. Please call with questions. Pt will follow up with stroke clinic NP at [facility] in about 4 weeks. Thanks for the consult.   Marvel Plan, MD PhD Stroke Neurology 01/27/2023 10:52 AM    To contact Stroke Continuity provider, please refer to [email or web address]. After hours, contact General Neurology

## 2023-01-27 NOTE — Progress Notes (Addendum)
PROGRESS NOTE                                                                                                                                                                                                             Patient Demographics:    Samantha Carpenter, is a 82 y.o. female, DOB - 12-23-1940, WGN:[id number]  Outpatient Primary MD for the patient is Pcp, No    LOS - 1  Admit date - 01/25/2023    Chief Complaint  Patient presents with   Code Stroke       Brief Narrative (HPI from H&P)   82 y.o. female with medical history significant of hypertension, hyperlipidemia, COPD, chronic diastolic congestive heart failure, CKD stage IIIa, dementia, and frequent falls who presented with complaints of difficulty speaking and right-sided weakness.  At baseline she could talk, walk without assistance although recommended to use walk, and care for herself.  She had just recently been hospitalized from 10/22-11/7 with acute hypoxic and hypercapnic respiratory failure thought secondary to COPD exacerbation and community acquired pneumonia.   She now presented to the hospital with high fevers, right-sided weakness, left-sided clavicular fracture, was diagnosed with pneumonia along with acute left MCA stroke.  She also developed severe encephalopathy and was admitted.   Subjective:    Samantha Carpenter today in bed, thoroughly confused unable to provide any history or answer any questions, cannot follow commands   Assessment  & Plan :   Stroke  Subacute.  Patient reportedly had been having difficulty with speech and right-sided weakness.  Symptoms had possibly been present prior to the patient going to rehab yesterday. 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  Seen by stroke team, stroke workup per neurology, currently on aspirin, will place on low-dose statin for better LDL control, carotid ultrasound noted but patient is not  an operative candidate, extremely weak and frail with dementia and severe delirium now.  Continue to monitor with supportive care will defer to stroke team for any treatment plan changes with stroke.   Sepsis due to pneumonia present on admission.  High suspicion for aspiration pneumonia due to new stroke, will switch to Unasyn for antibiotics, speech therapy following, aspiration precautions..   Severe metabolic encephalopathy with underlying dementia.  Supportive care.  Minimize narcotics and benzodiazepines.  Involve palliative care for long-term goals of  care.  EEG nonacute.  Dysphagia due to combination of stroke and encephalopathy.  Failed speech eval, gentle IV fluids, appears to be very poor candidate for NG tube or tube feeds, tried to call family multiple times on 01/26/2023 and 01/27/2023, both numbers are going to voicemail but the voicemail is not set up.  Palliative care to evaluate.  Long-term prognosis appears poor.    Dehydration and hypernatremia.  IV fluids.     Hypokalemia.  Replace.    COPD exacerbation No wheezing, supportive care.   Clavicle fracture secondary to fall Bilateral knee contusions Suspect prior to arrival.  On exam patient with bruising of the bilateral knees thought secondary to falls.  left arm placed in sling outpatient orthopedic follow-up, not wait bearing in left arm.   Essential hypertension Stroke likely more than 2 doses old per stroke team, gentle titration of blood pressure medications.   Chronic diastolic congestive heart failure EF 60% Chronic.  Patient does not appear  fluid overloaded at this time.  Continue to monitor for any diuretic needs.   Chronic kidney disease stage IIIb Creatinine noted to be 1.36 with BUN 29.  Creatinine appears near patient's baseline. -Continue to monitor kidney function.   Macrocytic anemia Chronic.  Hemoglobin 10.6 with MCV 100.6.  Hemoglobin appears stable. -Continue to monitor    Dementia -Delirium  precautions -Set bed alarm on  Prediabetes On admission glucose 172.  Last available hemoglobin A1c was 6.1.  Patient is not on any medications for treatment. -Follow-up hemoglobin A1c will add ISS as she will be on a short course of steroids for COPD exacerbation.   CBG (last 3)  Recent Labs    01/26/23 1608 01/26/23 2045 01/27/23 0800  GLUCAP 144* 113* 125*            Condition - Extremely Guarded  Family Communication  :    Called sister Carney Bern [phone]  -voicemail not set up, 01/26/2023 at 8:30 AM.  Called again 01/27/2023 at 8:44 AM, no response, voicemail not set up  Called niece Selena Batten [phone], voicemail not set up 01/26/2023 at 8:31 AM.  Called again on 01/27/2023 at 8:45 AM, no response, voicemail not set up  Code Status : DNR  Consults  : Neurology, Pall care  PUD Prophylaxis :    Procedures  :     EEG.  Nonacute.    Echocardiogram.  1. Left ventricular ejection fraction, by estimation, is 70 to 75%. The left ventricle has hyperdynamic function. The left ventricle has no regional wall motion abnormalities. Left ventricular diastolic function could not be evaluated.  2. Right ventricular systolic function is normal. The right ventricular size is normal.  3. The mitral valve is grossly normal. No evidence of mitral valve regurgitation.  4. The aortic valve has an indeterminant number of cusps. Aortic valve regurgitation is not visualized. No aortic stenosis is present.  5. The inferior vena cava is normal in size with greater than 50% respiratory variability, suggesting right atrial pressure of 3 mmHg. Comparison(s): The left ventricular function is unchanged.   Vascular duplex.    Right Carotid: Velocities in the right ICA are consistent with a 1-39% stenosis. Non-hemodynamically significant plaque <50% noted in the CCA. The ECA appears >50% stenosed.   Left Carotid: Velocities in the left ICA are consistent with a 1-39% stenosis. The ECA appears <50% stenosed.  Vertebrals:  Bilateral vertebral arteries demonstrate antegrade flow. Subclavians: Normal flow hemodynamics were seen in bilateral subclavian arteries.  MRI -  1. Acute/subacute infarct in the posterior left MCA territory correlating with areas of hypodensity seen on recent head CT. No hemorrhagic transformation or significant mass effect. 2. Loss of flow related enhancement in the left MCA bifurcation may represent nonocclusive thrombus. 3. 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 4. Diffusely small caliber of the P1 and proximal P2 segment of the fetal left PCA which remains patent. 5. Moderate chronic microvascular ischemic changes of the white matter.      Disposition Plan  :    Status is: Observation  DVT Prophylaxis  :    enoxaparin (LOVENOX) injection 40 mg Start: 01/25/23 1900   Lab Results  Component Value Date   PLT 305 01/27/2023    Diet :  Diet Order             Diet NPO time specified  Diet effective now                    Inpatient Medications  Scheduled Meds:  aspirin  300 mg Rectal Daily   Or   aspirin  81 mg Oral Daily   bisoprolol  5 mg Oral Daily   DULoxetine  60 mg Oral Daily   enoxaparin (LOVENOX) injection  40 mg Subcutaneous Q24H   gabapentin  100 mg Oral BID   insulin aspart  0-5 Units Subcutaneous QHS   insulin aspart  0-9 Units Subcutaneous TID WC   methylPREDNISolone  4 mg Oral PC lunch   methylPREDNISolone  4 mg Oral PC supper   methylPREDNISolone  4 mg Oral 3 x daily with food   [START ON 01/28/2023] methylPREDNISolone  4 mg Oral 4X daily taper   methylPREDNISolone  8 mg Oral Nightly   methylPREDNISolone  8 mg Oral Nightly   mometasone-formoterol  2 puff Inhalation BID   OLANZapine  2.5 mg Oral BID   polyethylene glycol  17 g Oral BID   sodium chloride flush  3 mL Intravenous Q12H   Continuous Infusions:  ampicillin-sulbactam (UNASYN) IV 3 g (01/27/23 0631)   dextrose     potassium chloride     PRN Meds:.acetaminophen  **OR** acetaminophen, albuterol, hydrALAZINE, [DISCONTINUED] ondansetron **OR** ondansetron (ZOFRAN) IV, traMADol  Antibiotics  :    Anti-infectives (From admission, onward)    Start     Dose/Rate Route Frequency Ordered Stop   01/26/23 1800  Ampicillin-Sulbactam (UNASYN) 3 g in sodium chloride 0.9 % 100 mL IVPB        3 g 200 mL/hr over 30 Minutes Intravenous Every 12 hours 01/26/23 0856 01/31/23 1759   01/25/23 1600  cefTRIAXone (ROCEPHIN) 2 g in sodium chloride 0.9 % 100 mL IVPB  Status:  Discontinued        2 g 200 mL/hr over 30 Minutes Intravenous Every 24 hours 01/25/23 1547 01/26/23 0856   01/25/23 1600  azithromycin (ZITHROMAX) 500 mg in dextrose 5 % 250 mL IVPB  Status:  Discontinued        500 mg 250 mL/hr over 60 Minutes Intravenous Every 24 hours 01/25/23 1547 01/26/23 0856         Objective:   Vitals:   01/27/23 0450 01/27/23 0451 01/27/23 0504 01/27/23 0801  BP: (!) 153/61  (!) 187/65 (!) 156/57  Pulse: 88 87  92  Resp: (!) 25 (!) 40  (!) 21  Temp: (!) 100.5 F (38.1 C)   99.8 F (37.7 C)  TempSrc: Oral   Axillary  SpO2: 98% 99%  98%  Weight:  Wt Readings from Last 3 Encounters:  01/25/23 61.2 kg  01/08/23 68 kg  11/06/22 67 kg     Intake/Output Summary (Last 24 hours) at 01/27/2023 0843 Last data filed at 01/27/2023 0631 Gross per 24 hour  Intake --  Output 300 ml  Net -300 ml     Physical Exam  Awake therapy confused and mumbling, left arm in sling, cannot follow commands,  [place].AT,PERRAL Supple Neck, No JVD,   Symmetrical Chest wall movement, Good air movement bilaterally, CTAB RRR,No Gallops,Rubs or new Murmurs,  +ve B.Sounds, Abd Soft, No tenderness,   No Cyanosis, Clubbing or edema       Data Review:    Recent Labs  Lab 01/24/23 0417 01/25/23 1045 01/25/23 1052 01/26/23 0415 01/27/23 0445  WBC 11.2* 8.6  --  10.2 8.2  HGB 10.7* 10.6* 10.9* 11.1* 10.8*  HCT 33.7* 34.6* 32.0* 34.5* 34.3*  PLT 363 337  --  356 305  MCV  96.3 100.6*  --  98.0 97.7  MCH 30.6 30.8  --  31.5 30.8  MCHC 31.8 30.6  --  32.2 31.5  RDW 13.6 13.7  --  13.6 13.6  LYMPHSABS  --  1.3  --   --  0.8  MONOABS  --  0.7  --   --  0.5  EOSABS  --  0.1  --   --  0.1  BASOSABS  --  0.1  --   --  0.0    Recent Labs  Lab 01/23/23 0512 01/24/23 0417 01/25/23 1045 01/25/23 1052 01/25/23 1648 01/25/23 1911 01/25/23 2325 01/26/23 0415 01/27/23 0445 01/27/23 0708  NA 143 144 146* 145  --   --   --  146* 147*  --   K 3.9 3.9 3.6 3.5  --   --   --  3.5 3.4*  --   CL 98 99 103 102  --   --   --  103 105  --   CO2 34* 33* 30  --   --   --   --  30 28  --   ANIONGAP 11 12 13   --   --   --   --  13 14  --   GLUCOSE 157* 174* 167* 172*  --   --   --  138* 141*  --   BUN 37* 32* 29* 31*  --   --   --  29* 23  --   CREATININE 1.41* 1.26* 1.36* 1.40*  --   --   --  1.41* 1.19*  --   AST  --   --  32  --   --   --   --   --   --   --   ALT  --   --  20  --   --   --   --   --   --   --   ALKPHOS  --   --  72  --   --   --   --   --   --   --   BILITOT  --   --  0.7  --   --   --   --   --   --   --   ALBUMIN  --   --  3.2*  --   --   --   --   --   --   --   CRP  --   --   --   --   --   --   --   --   --  8.1*  PROCALCITON  --   --   --   --  <0.10  --   --   --   --   --   LATICACIDVEN  --   --   --   --   --  1.3 1.2  --   --   --   INR  --   --  1.1  --   --   --   --   --   --   --   HGBA1C  --   --   --   --   --   --   --  6.6*  --   --   BNP  --   --   --   --   --   --   --   --  689.5*  --   MG 2.3  --   --   --   --   --   --   --  1.9  --   CALCIUM 9.0 8.9 9.0  --   --   --   --  9.3 8.9  --       Recent Labs  Lab 01/23/23 0512 01/24/23 0417 01/25/23 1045 01/25/23 1648 01/25/23 1911 01/25/23 2325 01/26/23 0415 01/27/23 0445 01/27/23 0708  CRP  --   --   --   --   --   --   --   --  8.1*  PROCALCITON  --   --   --  <0.10  --   --   --   --   --   LATICACIDVEN  --   --   --   --  1.3 1.2  --   --   --   INR  --   --   1.1  --   --   --   --   --   --   HGBA1C  --   --   --   --   --   --  6.6*  --   --   BNP  --   --   --   --   --   --   --  689.5*  --   MG 2.3  --   --   --   --   --   --  1.9  --   CALCIUM 9.0 8.9 9.0  --   --   --  9.3 8.9  --     --------------------------------------------------------------------------------------------------------------- Lab Results  Component Value Date   CHOL 172 01/26/2023   HDL 66 01/26/2023   LDLCALC 78 01/26/2023   TRIG 138 01/26/2023   CHOLHDL 2.6 01/26/2023    Lab Results  Component Value Date   HGBA1C 6.6 (H) 01/26/2023      Micro Results Recent Results (from the past 240 hour(s))  Respiratory (~20 pathogens) panel by PCR     Status: None   Collection Time: 01/25/23  2:20 PM   Specimen: Anterior Nasal Swab; Respiratory  Result Value Ref Range Status   Adenovirus NOT DETECTED NOT DETECTED Final   Coronavirus 229E NOT DETECTED NOT DETECTED Final    Comment: (NOTE) The Coronavirus on the Respiratory Panel, DOES NOT test for the novel  Coronavirus (2019 nCoV)    Coronavirus HKU1 NOT DETECTED NOT DETECTED Final   Coronavirus NL63 NOT DETECTED NOT DETECTED Final   Coronavirus OC43 NOT DETECTED NOT DETECTED Final   Metapneumovirus NOT DETECTED  NOT DETECTED Final   Rhinovirus / Enterovirus NOT DETECTED NOT DETECTED Final   Influenza A NOT DETECTED NOT DETECTED Final   Influenza B NOT DETECTED NOT DETECTED Final   Parainfluenza Virus 1 NOT DETECTED NOT DETECTED Final   Parainfluenza Virus 2 NOT DETECTED NOT DETECTED Final   Parainfluenza Virus 3 NOT DETECTED NOT DETECTED Final   Parainfluenza Virus 4 NOT DETECTED NOT DETECTED Final   Respiratory Syncytial Virus NOT DETECTED NOT DETECTED Final   Bordetella pertussis NOT DETECTED NOT DETECTED Final   Bordetella Parapertussis NOT DETECTED NOT DETECTED Final   Chlamydophila pneumoniae NOT DETECTED NOT DETECTED Final   Mycoplasma pneumoniae NOT DETECTED NOT DETECTED Final    Comment: Performed  at [facility], [address] [place], [place] [address]  SARS Coronavirus 2 by RT PCR (hospital order, performed in [facility] hospital lab) *cepheid single result test* Anterior Nasal Swab     Status: None   Collection Time: 01/25/23  2:20 PM   Specimen: Anterior Nasal Swab  Result Value Ref Range Status   SARS Coronavirus 2 by RT PCR NEGATIVE NEGATIVE Final    Comment: Performed at [facility], [address] [place], [place] [address]  MRSA Next Gen by PCR, Nasal     Status: Abnormal   Collection Time: 01/25/23  6:15 PM  Result Value Ref Range Status   MRSA by PCR Next Gen DETECTED (A) NOT DETECTED Final    Comment: RESULT CALLED TO, READ BACK BY AND VERIFIED WITH: RN VICTORIA JYEKYE ON 01/25/23 @ 2244 BY DRT (NOTE) The GeneXpert MRSA Assay (FDA approved for NASAL specimens only), is one component of a comprehensive MRSA colonization surveillance program. It is not intended to diagnose MRSA infection nor to guide or monitor treatment for MRSA infections. Test performance is not FDA approved in patients less than 23 years old. Performed at [facility], [address] [place], [place] [address]   Culture, blood (Routine X 2) w Reflex to ID Panel     Status: None (Preliminary result)   Collection Time: 01/25/23  7:11 PM   Specimen: BLOOD  Result Value Ref Range Status   Specimen Description BLOOD SITE NOT SPECIFIED  Final   Special Requests   Final    BOTTLES DRAWN AEROBIC AND ANAEROBIC Blood Culture adequate volume   Culture   Final    NO GROWTH 2 DAYS Performed at [facility], [address], [place], [place] [address]    Report Status PENDING  Incomplete  Culture, blood (Routine X 2) w Reflex to ID Panel     Status: None (Preliminary result)   Collection Time: 01/25/23  7:11 PM   Specimen: BLOOD  Result Value Ref Range Status   Specimen Description BLOOD SITE NOT SPECIFIED  Final   Special Requests   Final    BOTTLES DRAWN AEROBIC ONLY Blood  Culture results may not be optimal due to an inadequate volume of blood received in culture bottles   Culture   Final    NO GROWTH 2 DAYS Performed at [facility], [address] [place], [place] [address]    Report Status PENDING  Incomplete    Radiology Reports DG Chest Port 1 View  Result Date: 01/27/2023 CLINICAL DATA:  Shortness of breath. EXAM: PORTABLE CHEST 1 VIEW COMPARISON:  January 26, 2023. FINDINGS: The heart size and mediastinal contours are within normal limits. Right lung is clear. Minimal left basilar subsegmental  atelectasis or infiltrate is noted. The visualized skeletal structures are unremarkable. IMPRESSION: Minimal left basilar subsegmental atelectasis or infiltrate. Electronically Signed   By: Lupita Raider M.D.   On: 01/27/2023 08:03   VAS US CAROTID (at [facility] and [facility] only)  Result Date: 01/26/2023 Carotid Arterial Duplex Study Patient Name:  SHENIQUE HARJU  Date of Exam:   01/25/2023 Medical Rec #: [id number]      Accession #:    [id number] Date of Birth: Jan 25, 1941     Patient Gender: F Patient Age:   89 years Exam Location:  [facility] Procedure:      VAS US CAROTID Referring Phys: Dewitt Hoes DE LA TORRE --------------------------------------------------------------------------------  Indications:       CVA. Risk Factors:      Hypertension. Limitations        Today's exam was limited due to the patient's respiratory                    variation and patient movement and talking. Comparison Study:  No prior exam. Performing Technologist: Fernande Bras  Examination Guidelines: A complete evaluation includes B-mode imaging, spectral Doppler, color Doppler, and power Doppler as needed of all accessible portions of each vessel. Bilateral testing is considered an integral part of a complete examination. Limited examinations for reoccurring indications may be performed as noted.  Right Carotid Findings:  +----------+--------+--------+--------+------------------+--------+           PSV cm/sEDV cm/sStenosisPlaque DescriptionComments +----------+--------+--------+--------+------------------+--------+ CCA Prox  88      18                                         +----------+--------+--------+--------+------------------+--------+ CCA Distal80      12                                         +----------+--------+--------+--------+------------------+--------+ ICA Prox  53      16                                         +----------+--------+--------+--------+------------------+--------+ ICA Mid   55      17                                         +----------+--------+--------+--------+------------------+--------+ ICA Distal80      20                                         +----------+--------+--------+--------+------------------+--------+ ECA       123     12                                         +----------+--------+--------+--------+------------------+--------+ +----------+--------+-------+--------+-------------------+           PSV cm/sEDV cmsDescribeArm Pressure (mmHG) +----------+--------+-------+--------+-------------------+ [id number]      11                                 +----------+--------+-------+--------+-------------------+ +---------+--------+--+--------+-+  VertebralPSV cm/s53EDV cm/s9 +---------+--------+--+--------+-+ Limited view of right vertebral. Left Carotid Findings: +----------+--------+--------+--------+-------------------------+--------+           PSV cm/sEDV cm/sStenosisPlaque Description       Comments +----------+--------+--------+--------+-------------------------+--------+ CCA Prox  122     22                                                +----------+--------+--------+--------+-------------------------+--------+ CCA Distal96      16                                                 +----------+--------+--------+--------+-------------------------+--------+ ICA Prox  129     18              heterogenous and calcific         +----------+--------+--------+--------+-------------------------+--------+ ICA Mid   87      22                                                +----------+--------+--------+--------+-------------------------+--------+ ICA Distal103     30                                                +----------+--------+--------+--------+-------------------------+--------+ ECA       395     31                                                +----------+--------+--------+--------+-------------------------+--------+ +----------+--------+--------+--------+-------------------+           PSV cm/sEDV cm/sDescribeArm Pressure (mmHG) +----------+--------+--------+--------+-------------------+ Subclavian101     21                                  +----------+--------+--------+--------+-------------------+ +---------+--------+--+--------+--+ VertebralPSV cm/s96EDV cm/s16 +---------+--------+--+--------+--+   Summary: Right Carotid: Velocities in the right ICA are consistent with a 1-39% stenosis.                Non-hemodynamically significant plaque <50% noted in the CCA. The                ECA appears >50% stenosed. Left Carotid: Velocities in the left ICA are consistent with a 1-39% stenosis.               The ECA appears <50% stenosed. Vertebrals:  Bilateral vertebral arteries demonstrate antegrade flow. Subclavians: Normal flow hemodynamics were seen in bilateral subclavian              arteries. *See table(s) above for measurements and observations.  Electronically signed by Carolynn Sayers on 01/26/2023 at 11:37:40 AM.    Final    DG Chest Port 1 View  Result Date: 01/26/2023 CLINICAL DATA:  Shortness of breath. EXAM: PORTABLE CHEST 1 VIEW COMPARISON:  01/25/2023 FINDINGS: Lungs are hyperexpanded. Cardiopericardial silhouette is at upper limits  of  normal for size. Interstitial markings are diffusely coarsened with chronic features. Patchy airspace disease again noted left lung base. Nodular density overlying the right lower lung likely represents focal calcification in anterior costal cartilage seen on recent chest CT. No substantial pleural effusion. Telemetry leads overlie the chest. IMPRESSION: 1. Hyperexpansion with chronic interstitial coarsening. 2. Patchy airspace disease left lung base, similar to prior. Electronically Signed   By: Kennith Center M.D.   On: 01/26/2023 08:00   DG Knee 1-2 Views Left  Result Date: 01/25/2023 CLINICAL DATA:  Left knee bruising. EXAM: LEFT KNEE - 1-2 VIEW COMPARISON:  None Available. FINDINGS: No fracture or dislocation. Mild medial tibiofemoral joint space narrowing. Mild peripheral spurring. Small quadriceps tendon enthesophyte. No significant knee joint effusion. No erosion or focal bone abnormality. No focal soft tissue abnormalities are seen. IMPRESSION: Mild osteoarthritis. Electronically Signed   By: Narda Rutherford M.D.   On: 01/25/2023 22:58   DG Knee 1-2 Views Right  Result Date: 01/25/2023 CLINICAL DATA:  Bruising on both knees. EXAM: RIGHT KNEE - 1-2 VIEW COMPARISON:  None Available. FINDINGS: No fracture or dislocation. Tricompartmental osteoarthritis most prominent in the medial tibiofemoral compartment. No significant knee joint effusion. No erosions or focal bone abnormality. No focal soft tissue abnormality. 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 Electronically Signed   By: Narda Rutherford M.D.   On: 01/25/2023 22:52   ECHOCARDIOGRAM COMPLETE  Result Date: 01/25/2023    ECHOCARDIOGRAM REPORT   Patient Name:   Corona Sanderson Date of Exam: 01/25/2023 Medical Rec #:  [id number]     Height:       64.0 in Accession #:    [id number]    Weight:       134.9 lb Date of Birth:  08-Feb-1941    BSA:          1.655 m Patient Age:    20 years       BP:           163/59 mmHg Patient Gender: F             HR:           69 bpm. Exam Location:  Inpatient Procedure: 2D Echo, Cardiac Doppler and Color Doppler Indications:    Stroke  History:        Patient has prior history of Echocardiogram examinations, most                 recent 04/18/2022. CHF, COPD and CKD, stage 3; Risk                 Factors:Hypertension.  Sonographer:    Lucendia Herrlich RCS Referring Phys: Lennox Solders DE LA TORRE IMPRESSIONS  1. Left ventricular ejection fraction, by estimation, is 70 to 75%. The left ventricle has hyperdynamic function. The left ventricle has no regional wall motion abnormalities. Left ventricular diastolic function could not be evaluated.  2. Right ventricular systolic function is normal. The right ventricular size is normal.  3. The mitral valve is grossly normal. No evidence of mitral valve regurgitation.  4. The aortic valve has an indeterminant number of cusps. Aortic valve regurgitation is not visualized. No aortic stenosis is present.  5. The inferior vena cava is normal in size with greater than 50% respiratory variability, suggesting right atrial pressure of 3 mmHg. Comparison(s): The left ventricular function is unchanged. FINDINGS  Left Ventricle: Left ventricular ejection fraction, by estimation, is 70 to 75%. The  left ventricle has hyperdynamic function. The left ventricle has no regional wall motion abnormalities. The left ventricular internal cavity size was normal in size. There is no left ventricular hypertrophy. Left ventricular diastolic function could not be evaluated. Right Ventricle: The right ventricular size is normal. Right vetricular wall thickness was not assessed. Right ventricular systolic function is normal. Left Atrium: Left atrial size was normal in size. Right Atrium: Right atrial size was normal in size. Pericardium: There is no evidence of pericardial effusion. Mitral Valve: The mitral valve is grossly normal. No evidence of mitral valve  regurgitation. Tricuspid Valve: The tricuspid valve is normal in structure. Tricuspid valve regurgitation is trivial. Aortic Valve: The aortic valve has an indeterminant number of cusps. Aortic valve regurgitation is not visualized. No aortic stenosis is present. Aortic valve peak gradient measures 6.9 mmHg. Pulmonic Valve: The pulmonic valve was normal in structure. Pulmonic valve regurgitation is not visualized. Aorta: The aortic root and ascending aorta are structurally normal, with no evidence of dilitation. Venous: The inferior vena cava is normal in size with greater than 50% respiratory variability, suggesting right atrial pressure of 3 mmHg. IAS/Shunts: No atrial level shunt detected by color flow Doppler.  LEFT VENTRICLE PLAX 2D LVIDd:         4.20 cm   Diastology LVIDs:         2.80 cm   LV e' medial:    4.24 cm/s LV PW:         0.80 cm   LV E/e' medial:  18.5 LV IVS:        0.80 cm   LV e' lateral:   3.92 cm/s LVOT diam:     1.80 cm   LV E/e' lateral: 20.0 LV SV:         49 LV SV Index:   29 LVOT Area:     2.54 cm  RIGHT VENTRICLE             IVC RV S prime:     11.70 cm/s  IVC diam: 1.60 cm TAPSE (M-mode): 2.8 cm LEFT ATRIUM             Index        RIGHT ATRIUM           Index LA diam:        2.70 cm 1.63 cm/m   RA Area:     11.10 cm LA Vol (A2C):   30.0 ml 18.13 ml/m  RA Volume:   22.00 ml  13.29 ml/m LA Vol (A4C):   26.1 ml 15.77 ml/m LA Biplane Vol: 31.2 ml 18.85 ml/m  AORTIC VALVE AV Area (Vmax): 1.96 cm AV Vmax:        131.00 cm/s AV Peak Grad:   6.9 mmHg LVOT Vmax:      101.00 cm/s LVOT Vmean:     64.300 cm/s LVOT VTI:       0.191 m  AORTA Ao Root diam: 3.10 cm Ao Asc diam:  2.80 cm MITRAL VALVE               TRICUSPID VALVE MV Area (PHT): 2.45 cm    TR Peak grad:   4.9 mmHg MV Decel Time: 310 msec    TR Vmax:        111.00 cm/s MV E velocity: 78.40 cm/s MV A velocity: 97.70 cm/s  SHUNTS MV E/A ratio:  0.80        Systemic VTI:  0.19 m  Systemic Diam: 1.80 cm  Dietrich Pates MD Electronically signed by Dietrich Pates MD Signature Date/Time: 01/25/2023/4:52:48 PM    Final    EEG adult  Result Date: 01/25/2023 Charlsie Quest, MD     01/25/2023  4:52 PM Patient Name: Shealy Scheffler MRN: [id number] Epilepsy Attending: Charlsie Quest Referring Physician/Provider: Ernestina Columbia, Lennox Solders, N Date: 01/25/2023 Duration: 25.40 mins Patient history: 82yo F with right sided weakness getting eeg to evaluate for seizure Level of alertness: Awake AEDs during EEG study: None Technical aspects: This EEG study was done with scalp electrodes positioned according to the 10-20 International system of electrode placement. Electrical activity was reviewed with band pass filter of 1-70Hz , sensitivity of 7 uV/mm, display speed of 3mm/sec with a 60Hz  notched filter applied as appropriate. EEG data were recorded continuously and digitally stored.  Video monitoring was available and reviewed as appropriate. Description: The posterior dominant rhythm consists of 8 Hz activity of moderate voltage (25-35 uV) seen predominantly in posterior head regions, symmetric and reactive to eye opening and eye closing. EEG showed intermittent generalized 3 to 6 Hz theta-delta slowing. Sharp transients were noted in left>right posterior quadrant. Hyperventilation and photic stimulation were not performed.   Patient was noted to have chewing like movements intermittently. Concomitant eeg before, during and after the event didn't show any eeg change to suggest seizure. ABNORMALITY - Intermittent slow, generalized IMPRESSION: This study is suggestive of mild diffuse encephalopathy. No seizures or definite epileptiform discharges were seen throughout the recording. Patient was noted to have chewing like movements intermittently without concomitant eeg change. These events are non-epileptic. Charlsie Quest   MR BRAIN WO CONTRAST  Result Date: 01/25/2023 CLINICAL DATA:  Stroke, follow-up. EXAM: MRI HEAD WITHOUT  CONTRAST MRA HEAD WITHOUT CONTRAST TECHNIQUE: Multiplanar, multi-echo pulse sequences of the brain and surrounding structures were acquired without intravenous contrast. Angiographic images of the Circle of Willis were acquired using MRA technique without intravenous contrast. COMPARISON:  Head CT January 25, 2023. FINDINGS: MRI HEAD FINDINGS Brain: Confluent areas of restricted diffusion involving the insula, posterior frontal and parietal lobes, consistent with acute/subacute infarct in the posterior left MCA territory correlating with areas of hypodensity seen on recent head CT. No hemorrhagic transformation or significant mass effect. Scattered confluent foci of T2 hyperintensity are seen within the white matter of the cerebral hemispheres, nonspecific, most likely related to chronic small vessel ischemia. Mild parenchymal volume loss. Vascular: Susceptibility artifact is seen in the anterior left sylvian fissure and left parietal sulcus, most likely related to vessel thrombosis. Skull and upper cervical spine: Normal marrow signal. Sinuses/Orbits: Bilateral lens surgery. Paranasal sinuses are essentially clear. Bilateral mastoid effusion. Other: None. MRA HEAD FINDINGS Anterior circulation: Normal caliber and flow related enhancement of the bilateral intracranial ICAs. Focus of loss of flow related enhancement is seen in the left MCA bifurcation (series 3, image 95), may represent nonocclusive thrombus. Loss of flow related enhancement is also seen a proximal left M2/MCA posterior branch with reconstitution of flow at the distal M2 segment and subsequent loss of flow at the M3 level. The right MCA vascular tree remains patent. The azygous ACA has normal caliber and flow related enhancement. Posterior circulation: Hypoplastic right vertebral artery supplying primarily the right PICA with minimal flow to the basilar artery. The dominant left vertebral artery has normal caliber flow related enhancement. Normal  caliber and flow related enhancement of the basilar artery and right posterior cerebral artery. Diffusely small caliber of the P1 and proximal  P2 segment of the fetal left PCA which remains patent. Anatomic variants: Hypoplastic left A1/ACA with azygous A2/ACA segment. Fetal left PCA. IMPRESSION: 1. Acute/subacute infarct in the posterior left MCA territory correlating with areas of hypodensity seen on recent head CT. No hemorrhagic transformation or significant mass effect. 2. Loss of flow related enhancement in the left MCA bifurcation may represent nonocclusive thrombus. 3. 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 4. Diffusely small caliber of the P1 and proximal P2 segment of the fetal left PCA which remains patent. 5. Moderate chronic microvascular ischemic changes of the white matter. Electronically Signed   By: Baldemar Lenis M.D.   On: 01/25/2023 13:59   MR ANGIO HEAD WO CONTRAST  Result Date: 01/25/2023 CLINICAL DATA:  Stroke, follow-up. EXAM: MRI HEAD WITHOUT CONTRAST MRA HEAD WITHOUT CONTRAST TECHNIQUE: Multiplanar, multi-echo pulse sequences of the brain and surrounding structures were acquired without intravenous contrast. Angiographic images of the Circle of Willis were acquired using MRA technique without intravenous contrast. COMPARISON:  Head CT January 25, 2023. FINDINGS: MRI HEAD FINDINGS Brain: Confluent areas of restricted diffusion involving the insula, posterior frontal and parietal lobes, consistent with acute/subacute infarct in the posterior left MCA territory correlating with areas of hypodensity seen on recent head CT. No hemorrhagic transformation or significant mass effect. Scattered confluent foci of T2 hyperintensity are seen within the white matter of the cerebral hemispheres, nonspecific, most likely related to chronic small vessel ischemia. Mild parenchymal volume loss. Vascular: Susceptibility artifact is seen in the anterior left sylvian fissure and  left parietal sulcus, most likely related to vessel thrombosis. Skull and upper cervical spine: Normal marrow signal. Sinuses/Orbits: Bilateral lens surgery. Paranasal sinuses are essentially clear. Bilateral mastoid effusion. Other: None. MRA HEAD FINDINGS Anterior circulation: Normal caliber and flow related enhancement of the bilateral intracranial ICAs. Focus of loss of flow related enhancement is seen in the left MCA bifurcation (series 3, image 95), may represent nonocclusive thrombus. Loss of flow related enhancement is also seen a proximal left M2/MCA posterior branch with reconstitution of flow at the distal M2 segment and subsequent loss of flow at the M3 level. The right MCA vascular tree remains patent. The azygous ACA has normal caliber and flow related enhancement. Posterior circulation: Hypoplastic right vertebral artery supplying primarily the right PICA with minimal flow to the basilar artery. The dominant left vertebral artery has normal caliber flow related enhancement. Normal caliber and flow related enhancement of the basilar artery and right posterior cerebral artery. Diffusely small caliber of the P1 and proximal P2 segment of the fetal left PCA which remains patent. Anatomic variants: Hypoplastic left A1/ACA with azygous A2/ACA segment. Fetal left PCA. IMPRESSION: 1. Acute/subacute infarct in the posterior left MCA territory correlating with areas of hypodensity seen on recent head CT. No hemorrhagic transformation or significant mass effect. 2. Loss of flow related enhancement in the left MCA bifurcation may represent nonocclusive thrombus. 3. 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 4. Diffusely small caliber of the P1 and proximal P2 segment of the fetal left PCA which remains patent. 5. Moderate chronic microvascular ischemic changes of the white matter. Electronically Signed   By: Baldemar Lenis M.D.   On: 01/25/2023 13:59   DG Chest Portable 1 View  Result  Date: 01/25/2023 CLINICAL DATA:  Altered mental status. EXAM: PORTABLE CHEST 1 VIEW COMPARISON:  January 10, 2023. FINDINGS: The heart size and mediastinal contours are within normal limits. Both lungs are clear. Moderately  displaced distal left clavicular fracture is again noted. IMPRESSION: No acute cardiopulmonary abnormality seen. Moderately displaced distal left clavicular fracture. Electronically Signed   By: Lupita Raider M.D.   On: 01/25/2023 13:30   CT HEAD CODE STROKE WO CONTRAST  Result Date: 01/25/2023 CLINICAL DATA:  Code stroke. Neuro deficit, acute, stroke suspected. Aphasia. EXAM: CT HEAD WITHOUT CONTRAST TECHNIQUE: Contiguous axial images were obtained from the base of the skull through the vertex without intravenous contrast. RADIATION DOSE REDUCTION: This exam was performed according to the departmental dose-optimization program which includes automated exposure control, adjustment of the mA and/or kV according to patient size and/or use of iterative reconstruction technique. COMPARISON:  Head CT 01/08/2023 FINDINGS: Brain: There are acute to early subacute appearing infarcts in the left MCA territory involving the insula and cortex and white matter of the parietal lobe at the level of the operculum and corona radiata as well as more posterior superiorly in the parietal lobe. No intracranial hemorrhage, mass, midline shift, or extra-axial fluid collection is identified. Patchy hypodensities elsewhere in the cerebral white matter bilaterally are similar to the prior study and are nonspecific but compatible with chronic small vessel ischemic disease which is relatively mild for age. Mild cerebral atrophy is within normal limits for age. Vascular: Calcified atherosclerosis at the skull base. No hyperdense vessel. Skull: No acute fracture or suspicious osseous lesion. Sinuses/Orbits: Visualized paranasal sinuses are clear. Small right mastoid effusion. Bilateral cataract extraction. Other: None.  ASPECTS [facility] Stroke Program Early CT Score) - Ganglionic level infarction (caudate, lentiform nuclei, internal capsule, insula, M1-M3 cortex): 5 - Supraganglionic infarction (M4-M6 cortex): 2 Total score (0-10 with 10 being normal): 7 These results were communicated to Dr. Selina Cooley at 11:02 am on 01/25/2023 by text page via the [facility] messaging system. 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 ASPECTS of 7. Electronically Signed   By: Sebastian Ache M.D.   On: 01/25/2023 11:03      Signature  -   Susa Raring M.D on 01/27/2023 at 8:43 AM   -  To page go to [email or web address]

## 2023-01-27 NOTE — Progress Notes (Signed)
Speech recommending thin liquids, although okay to crush meds in applesauce. Pt unable to swallow meds for RN. Tried multiple times with coaching to swallow, unable to perform successfully. No PO meds administered today, MD made aware.

## 2023-01-27 NOTE — Plan of Care (Signed)
  Problem: Ischemic Stroke/TIA Tissue Perfusion: Goal: Complications of ischemic stroke/TIA will be minimized Outcome: Progressing   Problem: Education: Goal: Knowledge of disease or condition will improve Outcome: Not Progressing Goal: Knowledge of secondary prevention will improve (MUST DOCUMENT ALL) Outcome: Not Progressing Goal: Knowledge of patient specific risk factors will improve Samantha Carpenter N/A or DELETE if not current risk factor) Outcome: Not Progressing   Problem: Coping: Goal: Will verbalize positive feelings about self Outcome: Not Progressing Goal: Will identify appropriate support needs Outcome: Not Progressing

## 2023-01-28 DIAGNOSIS — Z7189 Other specified counseling: Secondary | ICD-10-CM | POA: Diagnosis not present

## 2023-01-28 DIAGNOSIS — I63512 Cerebral infarction due to unspecified occlusion or stenosis of left middle cerebral artery: Secondary | ICD-10-CM | POA: Diagnosis not present

## 2023-01-28 DIAGNOSIS — Z515 Encounter for palliative care: Secondary | ICD-10-CM | POA: Diagnosis not present

## 2023-01-28 LAB — BASIC METABOLIC PANEL
Anion gap: 13 (ref 5–15)
BUN: 26 mg/dL — ABNORMAL HIGH (ref 8–23)
CO2: 27 mmol/L (ref 22–32)
Calcium: 8.9 mg/dL (ref 8.9–10.3)
Chloride: 106 mmol/L (ref 98–111)
Creatinine, Ser: 1.28 mg/dL — ABNORMAL HIGH (ref 0.44–1.00)
GFR, Estimated: 42 mL/min — ABNORMAL LOW (ref >60)
Glucose, Bld: 164 mg/dL — ABNORMAL HIGH (ref 70–99)
Potassium: 3.8 mmol/L (ref 3.5–5.1)
Sodium: 146 mmol/L — ABNORMAL HIGH (ref 135–145)

## 2023-01-28 LAB — CBC WITH DIFFERENTIAL/PLATELET
Abs Immature Granulocytes: 0.05 10*3/uL (ref 0.00–0.07)
Basophils Absolute: 0 10*3/uL (ref 0.0–0.1)
Basophils Relative: 0 %
Eosinophils Absolute: 0.1 10*3/uL (ref 0.0–0.5)
Eosinophils Relative: 1 %
HCT: 34.2 % — ABNORMAL LOW (ref 36.0–46.0)
Hemoglobin: 10.4 g/dL — ABNORMAL LOW (ref 12.0–15.0)
Immature Granulocytes: 1 %
Lymphocytes Relative: 13 %
Lymphs Abs: 1.2 10*3/uL (ref 0.7–4.0)
MCH: 30.1 pg (ref 26.0–34.0)
MCHC: 30.4 g/dL (ref 30.0–36.0)
MCV: 98.8 fL (ref 80.0–100.0)
Monocytes Absolute: 0.5 10*3/uL (ref 0.1–1.0)
Monocytes Relative: 6 %
Neutro Abs: 6.8 10*3/uL (ref 1.7–7.7)
Neutrophils Relative %: 79 %
Platelets: 298 10*3/uL (ref 150–400)
RBC: 3.46 MIL/uL — ABNORMAL LOW (ref 3.87–5.11)
RDW: 13.9 % (ref 11.5–15.5)
WBC: 8.6 10*3/uL (ref 4.0–10.5)
nRBC: 0 % (ref 0.0–0.2)

## 2023-01-28 LAB — MAGNESIUM: Magnesium: 2.1 mg/dL (ref 1.7–2.4)

## 2023-01-28 LAB — GLUCOSE, CAPILLARY
Glucose-Capillary: 122 mg/dL — ABNORMAL HIGH (ref 70–99)
Glucose-Capillary: 142 mg/dL — ABNORMAL HIGH (ref 70–99)
Glucose-Capillary: 175 mg/dL — ABNORMAL HIGH (ref 70–99)
Glucose-Capillary: 148 mg/dL — ABNORMAL HIGH (ref 70–99)

## 2023-01-28 LAB — C-REACTIVE PROTEIN: CRP: 6.1 mg/dL — ABNORMAL HIGH (ref <1.0)

## 2023-01-28 LAB — PROCALCITONIN: Procalcitonin: 0.1 ng/mL

## 2023-01-28 LAB — BRAIN NATRIURETIC PEPTIDE: B Natriuretic Peptide: 563.2 pg/mL — ABNORMAL HIGH (ref 0.0–100.0)

## 2023-01-28 LAB — PHOSPHORUS: Phosphorus: 3.5 mg/dL (ref 2.5–4.6)

## 2023-01-28 MED ORDER — ENOXAPARIN SODIUM 30 MG/0.3ML IJ SOSY
30.0000 mg | PREFILLED_SYRINGE | INTRAMUSCULAR | Status: DC
  Administered 2023-01-28: 30 mg via SUBCUTANEOUS
  Filled 2023-01-28: qty 0.3

## 2023-01-28 MED ORDER — DEXTROSE 5 % IV SOLN: INTRAVENOUS | Status: DC

## 2023-01-28 NOTE — Plan of Care (Signed)
  Problem: Ischemic Stroke/TIA Tissue Perfusion: Goal: Complications of ischemic stroke/TIA will be minimized Outcome: Progressing   Problem: Health Behavior/Discharge Planning: Goal: Goals will be collaboratively established with patient/family Outcome: Progressing   Problem: Education: Goal: Knowledge of disease or condition will improve Outcome: Not Progressing Goal: Knowledge of secondary prevention will improve (MUST DOCUMENT ALL) Outcome: Not Progressing Goal: Knowledge of patient specific risk factors will improve Loraine Leriche N/A or DELETE if not current risk factor) Outcome: Not Progressing   Problem: Coping: Goal: Will verbalize positive feelings about self Outcome: Not Progressing Goal: Will identify appropriate support needs Outcome: Not Progressing   Problem: Health Behavior/Discharge Planning: Goal: Ability to manage health-related needs will improve Outcome: Not Progressing   Problem: Self-Care: Goal: Ability to participate in self-care as condition permits will improve Outcome: Not Progressing Goal: Verbalization of feelings and concerns over difficulty with self-care will improve Outcome: Not Progressing Goal: Ability to communicate needs accurately will improve Outcome: Not Progressing

## 2023-01-28 NOTE — Progress Notes (Signed)
Palliative Medicine Inpatient Follow Up Note HPI: 82 y.o. female with medical history significant of hypertension, hyperlipidemia, COPD, chronic diastolic congestive heart failure, CKD stage IIIa, dementia, and frequent falls who presented with complaints of difficulty speaking and right-sided weakness.  At baseline she could talk, walk without assistance although recommended to use walk, and care for herself.  She had just recently been hospitalized from 10/22-11/7 with acute hypoxic and hypercapnic respiratory failure thought secondary to COPD exacerbation and community acquired pneumonia.    She now presented to the hospital with high fevers, right-sided weakness, left-sided clavicular fracture, was diagnosed with pneumonia along with acute left MCA stroke.  She also developed severe encephalopathy and was admitted.   Palliative care asked to get involved to further support goals of care conversations.   Today's Discussion 01/28/2023  *Please note that this is a verbal dictation therefore any spelling or grammatical errors are due to the "[employer]" system interpretation.  Chart reviewed inclusive of vital signs, progress notes, laboratory results, and diagnostic images.   I met with patients RN, Samantha Carpenter this morning. She shares that she had tried to assist Samantha Carpenter to eat yesterday though from her observation she was at high risk of aspirating. She shares that patient does express words from time to time that are clear though overall she is aphasic.   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 She is minimally interactive and noted to be drooling. She incrementally moans. I was able to straighten her in the bed.   I called patients sister, Samantha Carpenter and created space for her to explore thoughts feelings and fears regarding Samantha Carpenter's current medical situation. She shares with me that she spoke to Dr. Thedore Mins who emphasized that patients long term outcomes were not  going to be favorable. She shares the though of transitioning to hospice if no improvements are seen. Samantha Carpenter has her daughter coming in today to see how Samantha Carpenter is doing and will based a good deal of her decision on her daughters assessment. I shares even if Samantha Carpenter does recognize people she is still in a debilitated state with a long term prognosis which is poor.   I offered emotional support through therapeutic listening.   Questions and concerns addressed/Palliative Support Provided.   Objective Assessment: Vital Signs Vitals:   01/28/23 0410 01/28/23 0800  BP: (!) 138/103 (!) 109/41  Pulse: 99 72  Resp: (!) 34 20  Temp:  99.7 F (37.6 C)  SpO2: 99% 100%    Intake/Output Summary (Last 24 hours) at 01/28/2023 1005 Last data filed at 01/28/2023 7253 Gross per 24 hour  Intake --  Output 200 ml  Net -200 ml   Last Weight  Most recent update: 01/25/2023 10:46 AM    Weight  61.2 kg (134 lb 14.7 oz)            Gen: Elderly Caucasian female in moderate distress or moaning HEENT: Dry mucous membranes CV: Regular rate and rhythm  PULM: On 2LPM nasal cannula - breathing is even and nonlabored ABD: soft/nontender  EXT: R hemiparesis Neuro: Responds with mumbling  SUMMARY OF RECOMMENDATIONS   DNAR/DNI   Plan to allow time to see how patient does over the day and if no improvements consider transition to comfort care and inpatient hospice   Ongoing palliative supportive care  Billing based on MDM: High ______________________________________________________________________________________ [name] [place] Palliative Medicine Team Team Cell Phone: [phone] Please utilize secure chat with additional questions,  if there is no response within 30 minutes please call the above phone number  Palliative Medicine Team providers are available by phone from 7am to 7pm daily and can be reached through the team cell phone.  Should this patient require assistance outside  of these hours, please call the patient's attending physician.

## 2023-01-28 NOTE — Plan of Care (Signed)
  Problem: Education: Goal: Knowledge of disease or condition will improve Outcome: Not Progressing Goal: Knowledge of secondary prevention will improve (MUST DOCUMENT ALL) Outcome: Not Progressing   Problem: Self-Care: Goal: Ability to participate in self-care as condition permits will improve Outcome: Not Progressing   Problem: Nutrition: Goal: Risk of aspiration will decrease Outcome: Not Progressing   Problem: Education: Goal: Knowledge of disease or condition will improve Outcome: Not Progressing

## 2023-01-28 NOTE — TOC Progression Note (Signed)
Transition of Care [facility]) - Progression Note    Patient Details  Name: Samantha Carpenter MRN: [id number] Date of Birth: 1941-01-28  Transition of Care [facility] CM/SW Contact  Mearl Latin, LCSW Phone Number: 01/28/2023, 11:41 AM  Clinical Narrative:    Per MD patient may require hospice facility so will continue to follow.    Expected Discharge Plan: Skilled Nursing Facility Barriers to Discharge: Continued Medical Work up, English as a second language teacher, SNF Pending bed offer  Expected Discharge Plan and Services In-house Referral: Clinical Social Work     Living arrangements for the past 2 months: Assisted Living Facility                                       Social Determinants of Health (SDOH) Interventions SDOH Screenings   Food Insecurity: Patient Unable To Answer (01/11/2023)  Housing: Patient Unable To Answer (01/11/2023)  Transportation Needs: No Transportation Needs (11/07/2022)  Utilities: Not At Risk (11/07/2022)  Financial Resource Strain: Low Risk  (09/02/2018)  Physical Activity: Sufficiently Active (09/02/2018)  Social Connections: Unknown (09/02/2018)  Stress: No Stress Concern Present (09/02/2018)  Tobacco Use: Medium Risk (01/08/2023)    Readmission Risk Interventions    01/26/2023   12:06 PM 01/11/2022    2:05 PM 01/09/2022    2:23 PM  Readmission Risk Prevention Plan  Transportation Screening Complete  Complete  HRI or Home Care Consult  Complete   Palliative Care Screening   Not Applicable  Medication Review (RN Care Manager) Complete  Complete  PCP or Specialist appointment within 3-5 days of discharge Complete    HRI or Home Care Consult Complete    SW Recovery Care/Counseling Consult Complete    Palliative Care Screening Not Applicable    Skilled Nursing Facility Complete

## 2023-01-28 NOTE — Progress Notes (Signed)
Physical Therapy Treatment Patient Details Name: Samantha Carpenter MRN: [id number] DOB: 1940/09/24 Today's Date: 01/28/2023   History of Present Illness 82 y.o. female presents to [facility] hospital on 01/25/2023 with R weakness and impaired speech. Pt with L clavicle fx NWB for LUE.  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 PMH includes MDD, HLD, emphysema, HTN, CKD III, COPD, dementia.    PT Comments  Pt received in supine and appearing to demonstrate a functional and cognitive decline since previous PT session. Pt is alert, but not responding to any cues or questions. Pt able to tolerate sitting to EOB with total A for bed mobility and sitting balance. Pt unable to follow commands to initiate any movement despite multimodal cues. Pt continues to benefit from PT services to progress toward functional mobility goals.    If plan is discharge home, recommend the following: A lot of help with walking and/or transfers;A lot of help with bathing/dressing/bathroom;Assist for transportation;Help with stairs or ramp for entrance;Assistance with cooking/housework;Direct supervision/assist for medications management;Direct supervision/assist for financial management;Supervision due to cognitive status   Can travel by private vehicle     No  Equipment Recommendations  Other (comment)    Recommendations for Other Services       Precautions / Restrictions Precautions Precautions: Fall Precaution Comments: hx of dementia Restrictions Weight Bearing Restrictions: Yes LUE Weight Bearing: Non weight bearing     Mobility  Bed Mobility Overal bed mobility: Needs Assistance Bed Mobility: Supine to Sit, Sit to Supine     Supine to sit: Total assist, HOB elevated, +2 for physical assistance Sit to supine: Total assist, HOB elevated, +2 for physical assistance   General bed mobility comments: Pt unable to follow commands or assist with any aspect requiring total A +2    Transfers                    General transfer comment: unable        Balance Overall balance assessment: Needs assistance Sitting-balance support: Feet supported, Single extremity supported Sitting balance-Leahy Scale: Zero Sitting balance - Comments: Pt requires total A to maintain sitting balance despite cues Postural control: Posterior lean                                  Cognition Arousal: Alert Behavior During Therapy: Flat affect Overall Cognitive Status: History of cognitive impairments - at baseline                                 General Comments: Pt not responding to any cues for following any commands. Pt with eyes open, but no verbalizations throughout session.        Exercises      General Comments        Pertinent Vitals/Pain Pain Assessment Pain Assessment: Faces Faces Pain Scale: No hurt     PT Goals (current goals can now be found in the care plan section) Acute Rehab PT Goals Patient Stated Goal: unable to state PT Goal Formulation: With patient Time For Goal Achievement: 02/08/23 Progress towards PT goals: Not progressing toward goals - comment (limited by impaired cognition)    Frequency    Min 1X/week       AM-PAC PT "6 Clicks" Mobility   Outcome Measure  Help needed turning from your back to your side while in a flat  bed without using bedrails?: Total Help needed moving from lying on your back to sitting on the side of a flat bed without using bedrails?: Total Help needed moving to and from a bed to a chair (including a wheelchair)?: Total Help needed standing up from a chair using your arms (e.g., wheelchair or bedside chair)?: Total Help needed to walk in hospital room?: Total Help needed climbing 3-5 steps with a railing? : Total 6 Click Score: 6    End of Session   Activity Tolerance: Other (comment) (limited by impaired cognition) Patient left: in bed;with call bell/phone within reach;with bed alarm set Nurse  Communication: Mobility status PT Visit Diagnosis: Muscle weakness (generalized) (M62.81);Unsteadiness on feet (R26.81)     Time: 8657-8469 PT Time Calculation (min) (ACUTE ONLY): 11 min  Charges:    $Therapeutic Activity: 8-22 mins PT General Charges $$ ACUTE PT VISIT: 1 Visit                     Johny Shock, PTA Acute Rehabilitation Services Secure Chat Preferred  [phone]    Johny Shock 01/28/2023, 1:23 PM

## 2023-01-28 NOTE — Progress Notes (Signed)
PROGRESS NOTE                                                                                                                                                                                                             Patient Demographics:    Samantha Carpenter, is a 82 y.o. female, DOB - 1941-02-01, JXB:[id number]  Outpatient Primary MD for the patient is Pcp, No    LOS - 2  Admit date - 01/25/2023    Chief Complaint  Patient presents with   Code Stroke       Brief Narrative (HPI from H&P)   82 y.o. female with medical history significant of hypertension, hyperlipidemia, COPD, chronic diastolic congestive heart failure, CKD stage IIIa, dementia, and frequent falls who presented with complaints of difficulty speaking and right-sided weakness.  At baseline she could talk, walk without assistance although recommended to use walk, and care for herself.  She had just recently been hospitalized from 10/22-11/7 with acute hypoxic and hypercapnic respiratory failure thought secondary to COPD exacerbation and community acquired pneumonia.   She now presented to the hospital with high fevers, right-sided weakness, left-sided clavicular fracture, was diagnosed with pneumonia along with acute left MCA stroke.  She also developed severe encephalopathy and was admitted.   Subjective:    Samantha Carpenter today in bed, thoroughly confused unable to provide any history or answer any questions, cannot follow commands   Assessment  & Plan :   Stroke  Subacute.  Patient reportedly had been having difficulty with speech and right-sided weakness.  Symptoms had possibly been present prior to the patient going to rehab yesterday. 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  Seen by stroke team, stroke workup per neurology, currently on aspirin, will place on low-dose statin for better LDL control, carotid ultrasound noted but patient is not  an operative candidate, extremely weak and frail with dementia and severe delirium now.  Continue supportive care.  Discussed with stroke team.   Sepsis due to pneumonia present on admission.  High suspicion for aspiration pneumonia due to new stroke, will switch to Unasyn for antibiotics, speech therapy following, aspiration precautions..   Severe metabolic encephalopathy with underlying dementia.  Supportive care.  Minimize narcotics and benzodiazepines.  Involve palliative care for long-term goals of care.  EEG nonacute.  Dysphagia due to combination of  stroke and encephalopathy.  Failed speech eval, gentle IV fluids, appears to be very poor candidate for NG tube or tube feeds, able to get in touch with sister on 01/28/2023, continue supportive care for another 24 hours, improvement less likely, persist or if no improvement then transition to hospice on 01/29/2023.  Dehydration and hypernatremia.  IV fluids.     Hypokalemia.  Replace.    COPD exacerbation No wheezing, supportive care.   Clavicle fracture secondary to fall Bilateral knee contusions Suspect prior to arrival.  On exam patient with bruising of the bilateral knees thought secondary to falls.  left arm placed in sling outpatient orthopedic follow-up, not wait bearing in left arm.   Essential hypertension Stroke likely more than 2 doses old per stroke team, gentle titration of blood pressure medications.   Chronic diastolic congestive heart failure EF 60% Chronic.  Patient does not appear  fluid overloaded at this time.  Continue to monitor for any diuretic needs.   Chronic kidney disease stage IIIb Creatinine noted to be 1.36 with BUN 29.  Creatinine appears near patient's baseline. -Continue to monitor kidney function.   Macrocytic anemia Chronic.  Hemoglobin 10.6 with MCV 100.6.  Hemoglobin appears stable. -Continue to monitor    Dementia -Delirium precautions -Set bed alarm on  Prediabetes On admission glucose  172.  Last available hemoglobin A1c was 6.1.  Patient is not on any medications for treatment. -Follow-up hemoglobin A1c will add ISS as she will be on a short course of steroids for COPD exacerbation.   CBG (last 3)  Recent Labs    01/27/23 1537 01/27/23 2052 01/28/23 0851  GLUCAP 151* 132* 122*            Condition - Extremely Guarded  Family Communication  :    Called sister Carney Bern [phone]  -voicemail not set up, 01/26/2023 at 8:30 AM.  Called again 01/27/2023 at 8:44 AM, no response, voicemail not set up, DW sister [phone]  in detail on 01/28/2023, if no improvement transition to hospice.  Called niece Selena Batten [phone], voicemail not set up 01/26/2023 at 8:31 AM.  Called again on 01/27/2023 at 8:45 AM, no response, voicemail not set up  Code Status : DNR  Consults  : Neurology, Pall care  PUD Prophylaxis :    Procedures  :     EEG.  Nonacute.    Echocardiogram.  1. Left ventricular ejection fraction, by estimation, is 70 to 75%. The left ventricle has hyperdynamic function. The left ventricle has no regional wall motion abnormalities. Left ventricular diastolic function could not be evaluated.  2. Right ventricular systolic function is normal. The right ventricular size is normal.  3. The mitral valve is grossly normal. No evidence of mitral valve regurgitation.  4. The aortic valve has an indeterminant number of cusps. Aortic valve regurgitation is not visualized. No aortic stenosis is present.  5. The inferior vena cava is normal in size with greater than 50% respiratory variability, suggesting right atrial pressure of 3 mmHg. Comparison(s): The left ventricular function is unchanged.   Vascular duplex.    Right Carotid: Velocities in the right ICA are consistent with a 1-39% stenosis. Non-hemodynamically significant plaque <50% noted in the CCA. The ECA appears >50% stenosed.   Left Carotid: Velocities in the left ICA are consistent with a 1-39% stenosis. The ECA  appears <50% stenosed. Vertebrals:  Bilateral vertebral arteries demonstrate antegrade flow. Subclavians: Normal flow hemodynamics were seen in bilateral subclavian arteries.  MRI -  1.  Acute/subacute infarct in the posterior left MCA territory correlating with areas of hypodensity seen on recent head CT. No hemorrhagic transformation or significant mass effect. 2. Loss of flow related enhancement in the left MCA bifurcation may represent nonocclusive thrombus. 3. 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 4. Diffusely small caliber of the P1 and proximal P2 segment of the fetal left PCA which remains patent. 5. Moderate chronic microvascular ischemic changes of the white matter.      Disposition Plan  :  SNF vs Hospice   DVT Prophylaxis  :    enoxaparin (LOVENOX) injection 40 mg Start: 01/25/23 1900   Lab Results  Component Value Date   PLT 298 01/28/2023    Diet :  Diet Order             Diet full liquid Fluid consistency: Thin  Diet effective now                    Inpatient Medications  Scheduled Meds:  aspirin  81 mg Oral Daily   bisoprolol  5 mg Oral Daily   clopidogrel  75 mg Oral Daily   DULoxetine  60 mg Oral Daily   enoxaparin (LOVENOX) injection  40 mg Subcutaneous Q24H   gabapentin  100 mg Oral BID   insulin aspart  0-5 Units Subcutaneous QHS   insulin aspart  0-9 Units Subcutaneous TID WC   losartan  50 mg Oral Daily   mometasone-formoterol  2 puff Inhalation BID   OLANZapine zydis  5 mg Oral BID   polyethylene glycol  17 g Oral BID   rosuvastatin  10 mg Oral Daily   sodium chloride flush  3 mL Intravenous Q12H   Continuous Infusions:  ampicillin-sulbactam (UNASYN) IV 3 g (01/28/23 0539)   dextrose 75 mL/hr at 01/28/23 0649   PRN Meds:.acetaminophen **OR** acetaminophen, albuterol, hydrALAZINE, [DISCONTINUED] ondansetron **OR** ondansetron (ZOFRAN) IV, traMADol  Antibiotics  :    Anti-infectives (From admission, onward)    Start      Dose/Rate Route Frequency Ordered Stop   01/26/23 1800  Ampicillin-Sulbactam (UNASYN) 3 g in sodium chloride 0.9 % 100 mL IVPB        3 g 200 mL/hr over 30 Minutes Intravenous Every 12 hours 01/26/23 0856 01/31/23 1759   01/25/23 1600  cefTRIAXone (ROCEPHIN) 2 g in sodium chloride 0.9 % 100 mL IVPB  Status:  Discontinued        2 g 200 mL/hr over 30 Minutes Intravenous Every 24 hours 01/25/23 1547 01/26/23 0856   01/25/23 1600  azithromycin (ZITHROMAX) 500 mg in dextrose 5 % 250 mL IVPB  Status:  Discontinued        500 mg 250 mL/hr over 60 Minutes Intravenous Every 24 hours 01/25/23 1547 01/26/23 0856         Objective:   Vitals:   01/28/23 0010 01/28/23 0308 01/28/23 0410 01/28/23 0800  BP: (!) 163/67 (!) 195/63 (!) 138/103 (!) 109/41  Pulse: 87 89 99 72  Resp: (!) 33 (!) 31 (!) 34 20  Temp:  98.6 F (37 C)  99.7 F (37.6 C)  TempSrc:  Axillary  Axillary  SpO2: 100% 100% 99% 100%  Weight:        Wt Readings from Last 3 Encounters:  01/25/23 61.2 kg  01/08/23 68 kg  11/06/22 67 kg     Intake/Output Summary (Last 24 hours) at 01/28/2023 0906 Last data filed at 01/28/2023 0523 Gross per 24 hour  Intake --  Output 200 ml  Net -200 ml     Physical Exam  Awake therapy confused and mumbling, left arm in sling, cannot follow commands, drooling from the left side of her mouth Martha.AT,PERRAL Supple Neck, No JVD,   Symmetrical Chest wall movement, Good air movement bilaterally, CTAB RRR,No Gallops,Rubs or new Murmurs,  +ve B.Sounds, Abd Soft, No tenderness,   No Cyanosis, Clubbing or edema       Data Review:    Recent Labs  Lab 01/24/23 0417 01/25/23 1045 01/25/23 1052 01/26/23 0415 01/27/23 0445 01/28/23 0330  WBC 11.2* 8.6  --  10.2 8.2 8.6  HGB 10.7* 10.6* 10.9* 11.1* 10.8* 10.4*  HCT 33.7* 34.6* 32.0* 34.5* 34.3* 34.2*  PLT 363 337  --  356 305 298  MCV 96.3 100.6*  --  98.0 97.7 98.8  MCH 30.6 30.8  --  31.5 30.8 30.1  MCHC 31.8 30.6  --  32.2 31.5  30.4  RDW 13.6 13.7  --  13.6 13.6 13.9  LYMPHSABS  --  1.3  --   --  0.8 1.2  MONOABS  --  0.7  --   --  0.5 0.5  EOSABS  --  0.1  --   --  0.1 0.1  BASOSABS  --  0.1  --   --  0.0 0.0    Recent Labs  Lab 01/23/23 0512 01/24/23 0417 01/25/23 1045 01/25/23 1052 01/25/23 1648 01/25/23 1911 01/25/23 2325 01/26/23 0415 01/27/23 0445 01/27/23 0708 01/28/23 0330  NA 143 144 146* 145  --   --   --  146* 147*  --  146*  K 3.9 3.9 3.6 3.5  --   --   --  3.5 3.4*  --  3.8  CL 98 99 103 102  --   --   --  103 105  --  106  CO2 34* 33* 30  --   --   --   --  30 28  --  27  ANIONGAP 11 12 13   --   --   --   --  13 14  --  13  GLUCOSE 157* 174* 167* 172*  --   --   --  138* 141*  --  164*  BUN 37* 32* 29* 31*  --   --   --  29* 23  --  26*  CREATININE 1.41* 1.26* 1.36* 1.40*  --   --   --  1.41* 1.19*  --  1.28*  AST  --   --  32  --   --   --   --   --   --   --   --   ALT  --   --  20  --   --   --   --   --   --   --   --   ALKPHOS  --   --  72  --   --   --   --   --   --   --   --   BILITOT  --   --  0.7  --   --   --   --   --   --   --   --   ALBUMIN  --   --  3.2*  --   --   --   --   --   --   --   --   CRP  --   --   --   --   --   --   --   --   --  8.1* 6.1*  PROCALCITON  --   --   --   --  <0.10  --   --   --   --  <0.10 0.10  LATICACIDVEN  --   --   --   --   --  1.3 1.2  --   --   --   --   INR  --   --  1.1  --   --   --   --   --   --   --   --   HGBA1C  --   --   --   --   --   --   --  6.6*  --   --   --   BNP  --   --   --   --   --   --   --   --  689.5*  --  563.2*  MG 2.3  --   --   --   --   --   --   --  1.9  --  2.1  CALCIUM 9.0 8.9 9.0  --   --   --   --  9.3 8.9  --  8.9      Recent Labs  Lab 01/23/23 0512 01/24/23 0417 01/25/23 1045 01/25/23 1648 01/25/23 1911 01/25/23 2325 01/26/23 0415 01/27/23 0445 01/27/23 0708 01/28/23 0330  CRP  --   --   --   --   --   --   --   --  8.1* 6.1*  PROCALCITON  --   --   --  <0.10  --   --   --   --  <0.10  0.10  LATICACIDVEN  --   --   --   --  1.3 1.2  --   --   --   --   INR  --   --  1.1  --   --   --   --   --   --   --   HGBA1C  --   --   --   --   --   --  6.6*  --   --   --   BNP  --   --   --   --   --   --   --  689.5*  --  563.2*  MG 2.3  --   --   --   --   --   --  1.9  --  2.1  CALCIUM 9.0 8.9 9.0  --   --   --  9.3 8.9  --  8.9    --------------------------------------------------------------------------------------------------------------- Lab Results  Component Value Date   CHOL 172 01/26/2023   HDL 66 01/26/2023   LDLCALC 78 01/26/2023   TRIG 138 01/26/2023   CHOLHDL 2.6 01/26/2023    Lab Results  Component Value Date   HGBA1C 6.6 (H) 01/26/2023      Micro Results Recent Results (from the past 240 hour(s))  Respiratory (~20 pathogens) panel by PCR     Status: None   Collection Time: 01/25/23  2:20 PM   Specimen: Anterior Nasal Swab; Respiratory  Result Value Ref Range Status   Adenovirus NOT DETECTED NOT DETECTED Final   Coronavirus 229E NOT DETECTED NOT DETECTED Final    Comment: (NOTE) The Coronavirus on the Respiratory Panel, DOES NOT test for the novel  Coronavirus (2019 nCoV)    Coronavirus HKU1 NOT DETECTED  NOT DETECTED Final   Coronavirus NL63 NOT DETECTED NOT DETECTED Final   Coronavirus OC43 NOT DETECTED NOT DETECTED Final   Metapneumovirus NOT DETECTED NOT DETECTED Final   Rhinovirus / Enterovirus NOT DETECTED NOT DETECTED Final   Influenza A NOT DETECTED NOT DETECTED Final   Influenza B NOT DETECTED NOT DETECTED Final   Parainfluenza Virus 1 NOT DETECTED NOT DETECTED Final   Parainfluenza Virus 2 NOT DETECTED NOT DETECTED Final   Parainfluenza Virus 3 NOT DETECTED NOT DETECTED Final   Parainfluenza Virus 4 NOT DETECTED NOT DETECTED Final   Respiratory Syncytial Virus NOT DETECTED NOT DETECTED Final   Bordetella pertussis NOT DETECTED NOT DETECTED Final   Bordetella Parapertussis NOT DETECTED NOT DETECTED Final   Chlamydophila pneumoniae NOT  DETECTED NOT DETECTED Final   Mycoplasma pneumoniae NOT DETECTED NOT DETECTED Final    Comment: Performed at [facility], [address] [place], [place] [address]  SARS Coronavirus 2 by RT PCR (hospital order, performed in [facility] hospital lab) *cepheid single result test* Anterior Nasal Swab     Status: None   Collection Time: 01/25/23  2:20 PM   Specimen: Anterior Nasal Swab  Result Value Ref Range Status   SARS Coronavirus 2 by RT PCR NEGATIVE NEGATIVE Final    Comment: Performed at [facility], [address] [place], [place] [address]  MRSA Next Gen by PCR, Nasal     Status: Abnormal   Collection Time: 01/25/23  6:15 PM  Result Value Ref Range Status   MRSA by PCR Next Gen DETECTED (A) NOT DETECTED Final    Comment: RESULT CALLED TO, READ BACK BY AND VERIFIED WITH: RN VICTORIA JYEKYE ON 01/25/23 @ 2244 BY DRT (NOTE) The GeneXpert MRSA Assay (FDA approved for NASAL specimens only), is one component of a comprehensive MRSA colonization surveillance program. It is not intended to diagnose MRSA infection nor to guide or monitor treatment for MRSA infections. Test performance is not FDA approved in patients less than 46 years old. Performed at [facility], [address], [place], [place] [address]   Culture, blood (Routine X 2) w Reflex to ID Panel     Status: None (Preliminary result)   Collection Time: 01/25/23  7:11 PM   Specimen: BLOOD  Result Value Ref Range Status   Specimen Description BLOOD SITE NOT SPECIFIED  Final   Special Requests   Final    BOTTLES DRAWN AEROBIC AND ANAEROBIC Blood Culture adequate volume   Culture   Final    NO GROWTH 3 DAYS Performed at [facility], [address], [place], [place] [address]    Report Status PENDING  Incomplete  Culture, blood (Routine X 2) w Reflex to ID Panel     Status: None (Preliminary result)   Collection Time: 01/25/23  7:11 PM   Specimen: BLOOD  Result Value Ref Range Status   Specimen  Description BLOOD SITE NOT SPECIFIED  Final   Special Requests   Final    BOTTLES DRAWN AEROBIC ONLY Blood Culture results may not be optimal due to an inadequate volume of blood received in culture bottles   Culture   Final    NO GROWTH 3 DAYS Performed at [facility], [address], [place], [place] [address]    Report Status PENDING  Incomplete    Radiology Reports VAS [place] LOWER EXTREMITY VENOUS (DVT)  Result Date: 01/27/2023  Lower Venous DVT Study Patient Name:  Samantha Carpenter  Date of Exam:   01/27/2023 Medical Rec #: [id number]      Accession #:    [id number] Date of Birth: 06-08-1940     Patient Gender: F Patient Age:   44 years Exam Location:  [facility] Procedure:      VAS [place] LOWER EXTREMITY VENOUS (DVT) Referring Phys: Scheryl Marten XU --------------------------------------------------------------------------------  Indications: Stroke.  Risk Factors: None identified. Limitations: Poor ultrasound/tissue interface and patient positioning, patient immobility. Comparison Study: No prior studies. Performing Technologist: Chanda Busing RVT  Examination Guidelines: A complete evaluation includes B-mode imaging, spectral Doppler, color Doppler, and power Doppler as needed of all accessible portions of each vessel. Bilateral testing is considered an integral part of a complete examination. Limited examinations for reoccurring indications may be performed as noted. The reflux portion of the exam is performed with the patient in reverse Trendelenburg.  +---------+---------------+---------+-----------+----------+--------------+ RIGHT    CompressibilityPhasicitySpontaneityPropertiesThrombus Aging +---------+---------------+---------+-----------+----------+--------------+ CFV      Full           Yes      Yes                                 +---------+---------------+---------+-----------+----------+--------------+ SFJ      Full                                                         +---------+---------------+---------+-----------+----------+--------------+ FV Prox  Full                                                        +---------+---------------+---------+-----------+----------+--------------+ FV Mid   Full                                                        +---------+---------------+---------+-----------+----------+--------------+ FV DistalFull                                                        +---------+---------------+---------+-----------+----------+--------------+ PFV      Full                                                        +---------+---------------+---------+-----------+----------+--------------+ POP      Full           Yes      Yes                                 +---------+---------------+---------+-----------+----------+--------------+ PTV      Full                                                        +---------+---------------+---------+-----------+----------+--------------+  PERO     Full                                                        +---------+---------------+---------+-----------+----------+--------------+   +---------+---------------+---------+-----------+----------+-------------------+ LEFT     CompressibilityPhasicitySpontaneityPropertiesThrombus Aging      +---------+---------------+---------+-----------+----------+-------------------+ CFV      Full           Yes      Yes                                      +---------+---------------+---------+-----------+----------+-------------------+ SFJ      Full                                                             +---------+---------------+---------+-----------+----------+-------------------+ FV Prox  Full                                                             +---------+---------------+---------+-----------+----------+-------------------+ FV Mid   Full                                                              +---------+---------------+---------+-----------+----------+-------------------+ FV DistalFull                                                             +---------+---------------+---------+-----------+----------+-------------------+ PFV      Full                                                             +---------+---------------+---------+-----------+----------+-------------------+ POP      Full           Yes      Yes                                      +---------+---------------+---------+-----------+----------+-------------------+ PTV      Full                                                             +---------+---------------+---------+-----------+----------+-------------------+  PERO                                                  Not well visualized +---------+---------------+---------+-----------+----------+-------------------+    Summary: RIGHT: - There is no evidence of deep vein thrombosis in the lower extremity. However, portions of this examination were limited- see technologist comments above.  - No cystic structure found in the popliteal fossa.  LEFT: - There is no evidence of deep vein thrombosis in the lower extremity. However, portions of this examination were limited- see technologist comments above.  - No cystic structure found in the popliteal fossa.  *See table(s) above for measurements and observations.    Preliminary    DG Chest Port 1 View  Result Date: 01/27/2023 CLINICAL DATA:  Shortness of breath. EXAM: PORTABLE CHEST 1 VIEW COMPARISON:  January 26, 2023. FINDINGS: The heart size and mediastinal contours are within normal limits. Right lung is clear. Minimal left basilar subsegmental atelectasis or infiltrate is noted. The visualized skeletal structures are unremarkable. IMPRESSION: Minimal left basilar subsegmental atelectasis or infiltrate. Electronically Signed   By: Lupita Raider M.D.   On: 01/27/2023 08:03   VAS US CAROTID  (at [facility] and [facility] only)  Result Date: 01/26/2023 Carotid Arterial Duplex Study Patient Name:  DAYSHIA GOLDSBOROUGH  Date of Exam:   01/25/2023 Medical Rec #: [id number]      Accession #:    [id number] Date of Birth: 1940/11/29     Patient Gender: F Patient Age:   20 years Exam Location:  [facility] Procedure:      VAS US CAROTID Referring Phys: Dewitt Hoes DE LA TORRE --------------------------------------------------------------------------------  Indications:       CVA. Risk Factors:      Hypertension. Limitations        Today's exam was limited due to the patient's respiratory                    variation and patient movement and talking. Comparison Study:  No prior exam. Performing Technologist: Fernande Bras  Examination Guidelines: A complete evaluation includes B-mode imaging, spectral Doppler, color Doppler, and power Doppler as needed of all accessible portions of each vessel. Bilateral testing is considered an integral part of a complete examination. Limited examinations for reoccurring indications may be performed as noted.  Right Carotid Findings: +----------+--------+--------+--------+------------------+--------+           PSV cm/sEDV cm/sStenosisPlaque DescriptionComments +----------+--------+--------+--------+------------------+--------+ CCA Prox  88      18                                         +----------+--------+--------+--------+------------------+--------+ CCA Distal80      12                                         +----------+--------+--------+--------+------------------+--------+ ICA Prox  53      16                                         +----------+--------+--------+--------+------------------+--------+ ICA Mid   55  17                                         +----------+--------+--------+--------+------------------+--------+ ICA Distal80      20                                          +----------+--------+--------+--------+------------------+--------+ ECA       123     12                                         +----------+--------+--------+--------+------------------+--------+ +----------+--------+-------+--------+-------------------+           PSV cm/sEDV cmsDescribeArm Pressure (mmHG) +----------+--------+-------+--------+-------------------+ [id number]      11                                 +----------+--------+-------+--------+-------------------+ +---------+--------+--+--------+-+ VertebralPSV cm/s53EDV cm/s9 +---------+--------+--+--------+-+ Limited view of right vertebral. Left Carotid Findings: +----------+--------+--------+--------+-------------------------+--------+           PSV cm/sEDV cm/sStenosisPlaque Description       Comments +----------+--------+--------+--------+-------------------------+--------+ CCA Prox  122     22                                                +----------+--------+--------+--------+-------------------------+--------+ CCA Distal96      16                                                +----------+--------+--------+--------+-------------------------+--------+ ICA Prox  129     18              heterogenous and calcific         +----------+--------+--------+--------+-------------------------+--------+ ICA Mid   87      22                                                +----------+--------+--------+--------+-------------------------+--------+ ICA Distal103     30                                                +----------+--------+--------+--------+-------------------------+--------+ ECA       395     31                                                +----------+--------+--------+--------+-------------------------+--------+ +----------+--------+--------+--------+-------------------+           PSV cm/sEDV cm/sDescribeArm Pressure (mmHG)  +----------+--------+--------+--------+-------------------+ Subclavian101     21                                  +----------+--------+--------+--------+-------------------+ +---------+--------+--+--------+--+  VertebralPSV cm/s96EDV cm/s16 +---------+--------+--+--------+--+   Summary: Right Carotid: Velocities in the right ICA are consistent with a 1-39% stenosis.                Non-hemodynamically significant plaque <50% noted in the CCA. The                ECA appears >50% stenosed. Left Carotid: Velocities in the left ICA are consistent with a 1-39% stenosis.               The ECA appears <50% stenosed. Vertebrals:  Bilateral vertebral arteries demonstrate antegrade flow. Subclavians: Normal flow hemodynamics were seen in bilateral subclavian              arteries. *See table(s) above for measurements and observations.  Electronically signed by Carolynn Sayers on 01/26/2023 at 11:37:40 AM.    Final    DG Chest Port 1 View  Result Date: 01/26/2023 CLINICAL DATA:  Shortness of breath. EXAM: PORTABLE CHEST 1 VIEW COMPARISON:  01/25/2023 FINDINGS: Lungs are hyperexpanded. Cardiopericardial silhouette is at upper limits of normal for size. Interstitial markings are diffusely coarsened with chronic features. Patchy airspace disease again noted left lung base. Nodular density overlying the right lower lung likely represents focal calcification in anterior costal cartilage seen on recent chest CT. No substantial pleural effusion. Telemetry leads overlie the chest. IMPRESSION: 1. Hyperexpansion with chronic interstitial coarsening. 2. Patchy airspace disease left lung base, similar to prior. Electronically Signed   By: Kennith Center M.D.   On: 01/26/2023 08:00   DG Knee 1-2 Views Left  Result Date: 01/25/2023 CLINICAL DATA:  Left knee bruising. EXAM: LEFT KNEE - 1-2 VIEW COMPARISON:  None Available. FINDINGS: No fracture or dislocation. Mild medial tibiofemoral joint space narrowing. Mild peripheral  spurring. Small quadriceps tendon enthesophyte. No significant knee joint effusion. No erosion or focal bone abnormality. No focal soft tissue abnormalities are seen. IMPRESSION: Mild osteoarthritis. Electronically Signed   By: Narda Rutherford M.D.   On: 01/25/2023 22:58   DG Knee 1-2 Views Right  Result Date: 01/25/2023 CLINICAL DATA:  Bruising on both knees. EXAM: RIGHT KNEE - 1-2 VIEW COMPARISON:  None Available. FINDINGS: No fracture or dislocation. Tricompartmental osteoarthritis most prominent in the medial tibiofemoral compartment. No significant knee joint effusion. No erosions or focal bone abnormality. No focal soft tissue abnormality. 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 Electronically Signed   By: Narda Rutherford M.D.   On: 01/25/2023 22:52   ECHOCARDIOGRAM COMPLETE  Result Date: 01/25/2023    ECHOCARDIOGRAM REPORT   Patient Name:   Bita Wagle Date of Exam: 01/25/2023 Medical Rec #:  [id number]     Height:       64.0 in Accession #:    [id number]    Weight:       134.9 lb Date of Birth:  26-May-1940    BSA:          1.655 m Patient Age:    65 years      BP:           163/59 mmHg Patient Gender: F             HR:           69 bpm. Exam Location:  Inpatient Procedure: 2D Echo, Cardiac Doppler and Color Doppler Indications:    Stroke  History:        Patient has prior history of Echocardiogram examinations, most  recent 04/18/2022. CHF, COPD and CKD, stage 3; Risk                 Factors:Hypertension.  Sonographer:    Lucendia Herrlich RCS Referring Phys: Lennox Solders [place] IMPRESSIONS  1. Left ventricular ejection fraction, by estimation, is 70 to 75%. The left ventricle has hyperdynamic function. The left ventricle has no regional wall motion abnormalities. Left ventricular diastolic function could not be evaluated.  2. Right ventricular systolic function is normal. The right ventricular size is normal.  3. The mitral  valve is grossly normal. No evidence of mitral valve regurgitation.  4. The aortic valve has an indeterminant number of cusps. Aortic valve regurgitation is not visualized. No aortic stenosis is present.  5. The inferior vena cava is normal in size with greater than 50% respiratory variability, suggesting right atrial pressure of 3 mmHg. Comparison(s): The left ventricular function is unchanged. FINDINGS  Left Ventricle: Left ventricular ejection fraction, by estimation, is 70 to 75%. The left ventricle has hyperdynamic function. The left ventricle has no regional wall motion abnormalities. The left ventricular internal cavity size was normal in size. There is no left ventricular hypertrophy. Left ventricular diastolic function could not be evaluated. Right Ventricle: The right ventricular size is normal. Right vetricular wall thickness was not assessed. Right ventricular systolic function is normal. Left Atrium: Left atrial size was normal in size. Right Atrium: Right atrial size was normal in size. Pericardium: There is no evidence of pericardial effusion. Mitral Valve: The mitral valve is grossly normal. No evidence of mitral valve regurgitation. Tricuspid Valve: The tricuspid valve is normal in structure. Tricuspid valve regurgitation is trivial. Aortic Valve: The aortic valve has an indeterminant number of cusps. Aortic valve regurgitation is not visualized. No aortic stenosis is present. Aortic valve peak gradient measures 6.9 mmHg. Pulmonic Valve: The pulmonic valve was normal in structure. Pulmonic valve regurgitation is not visualized. Aorta: The aortic root and ascending aorta are structurally normal, with no evidence of dilitation. Venous: The inferior vena cava is normal in size with greater than 50% respiratory variability, suggesting right atrial pressure of 3 mmHg. IAS/Shunts: No atrial level shunt detected by color flow Doppler.  LEFT VENTRICLE PLAX 2D LVIDd:         4.20 cm   Diastology LVIDs:          2.80 cm   LV e' medial:    4.24 cm/s LV PW:         0.80 cm   LV E/e' medial:  18.5 LV IVS:        0.80 cm   LV e' lateral:   3.92 cm/s LVOT diam:     1.80 cm   LV E/e' lateral: 20.0 LV SV:         49 LV SV Index:   29 LVOT Area:     2.54 cm  RIGHT VENTRICLE             IVC RV S prime:     11.70 cm/s  IVC diam: 1.60 cm TAPSE (M-mode): 2.8 cm LEFT ATRIUM             Index        RIGHT ATRIUM           Index LA diam:        2.70 cm 1.63 cm/m   RA Area:     11.10 cm LA Vol (A2C):   30.0 ml 18.13 ml/m  RA Volume:  22.00 ml  13.29 ml/m LA Vol (A4C):   26.1 ml 15.77 ml/m LA Biplane Vol: 31.2 ml 18.85 ml/m  AORTIC VALVE AV Area (Vmax): 1.96 cm AV Vmax:        131.00 cm/s AV Peak Grad:   6.9 mmHg LVOT Vmax:      101.00 cm/s LVOT Vmean:     64.300 cm/s LVOT VTI:       0.191 m  AORTA Ao Root diam: 3.10 cm Ao Asc diam:  2.80 cm MITRAL VALVE               TRICUSPID VALVE MV Area (PHT): 2.45 cm    TR Peak grad:   4.9 mmHg MV Decel Time: 310 msec    TR Vmax:        111.00 cm/s MV E velocity: 78.40 cm/s MV A velocity: 97.70 cm/s  SHUNTS MV E/A ratio:  0.80        Systemic VTI:  0.19 m                            Systemic Diam: 1.80 cm Dietrich Pates MD Electronically signed by Dietrich Pates MD Signature Date/Time: 01/25/2023/4:52:48 PM    Final    EEG adult  Result Date: 01/25/2023 Charlsie Quest, MD     01/25/2023  4:52 PM Patient Name: Ieisha Mallery MRN: [id number] Epilepsy Attending: Charlsie Quest Referring Physician/Provider: Ernestina Columbia, Lennox Solders, N Date: 01/25/2023 Duration: 25.40 mins Patient history: 82yo F with right sided weakness getting eeg to evaluate for seizure Level of alertness: Awake AEDs during EEG study: None Technical aspects: This EEG study was done with scalp electrodes positioned according to the 10-20 International system of electrode placement. Electrical activity was reviewed with band pass filter of 1-70Hz , sensitivity of 7 uV/mm, display speed of 66mm/sec with a 60Hz  notched filter applied  as appropriate. EEG data were recorded continuously and digitally stored.  Video monitoring was available and reviewed as appropriate. Description: The posterior dominant rhythm consists of 8 Hz activity of moderate voltage (25-35 uV) seen predominantly in posterior head regions, symmetric and reactive to eye opening and eye closing. EEG showed intermittent generalized 3 to 6 Hz theta-delta slowing. Sharp transients were noted in left>right posterior quadrant. Hyperventilation and photic stimulation were not performed.   Patient was noted to have chewing like movements intermittently. Concomitant eeg before, during and after the event didn't show any eeg change to suggest seizure. ABNORMALITY - Intermittent slow, generalized IMPRESSION: This study is suggestive of mild diffuse encephalopathy. No seizures or definite epileptiform discharges were seen throughout the recording. Patient was noted to have chewing like movements intermittently without concomitant eeg change. These events are non-epileptic. Charlsie Quest   MR BRAIN WO CONTRAST  Result Date: 01/25/2023 CLINICAL DATA:  Stroke, follow-up. EXAM: MRI HEAD WITHOUT CONTRAST MRA HEAD WITHOUT CONTRAST TECHNIQUE: Multiplanar, multi-echo pulse sequences of the brain and surrounding structures were acquired without intravenous contrast. Angiographic images of the Circle of Willis were acquired using MRA technique without intravenous contrast. COMPARISON:  Head CT January 25, 2023. FINDINGS: MRI HEAD FINDINGS Brain: Confluent areas of restricted diffusion involving the insula, posterior frontal and parietal lobes, consistent with acute/subacute infarct in the posterior left MCA territory correlating with areas of hypodensity seen on recent head CT. No hemorrhagic transformation or significant mass effect. Scattered confluent foci of T2 hyperintensity are seen within the white matter of the cerebral hemispheres, nonspecific, most  likely related to chronic small  vessel ischemia. Mild parenchymal volume loss. Vascular: Susceptibility artifact is seen in the anterior left sylvian fissure and left parietal sulcus, most likely related to vessel thrombosis. Skull and upper cervical spine: Normal marrow signal. Sinuses/Orbits: Bilateral lens surgery. Paranasal sinuses are essentially clear. Bilateral mastoid effusion. Other: None. MRA HEAD FINDINGS Anterior circulation: Normal caliber and flow related enhancement of the bilateral intracranial ICAs. Focus of loss of flow related enhancement is seen in the left MCA bifurcation (series 3, image 95), may represent nonocclusive thrombus. Loss of flow related enhancement is also seen a proximal left M2/MCA posterior branch with reconstitution of flow at the distal M2 segment and subsequent loss of flow at the M3 level. The right MCA vascular tree remains patent. The azygous ACA has normal caliber and flow related enhancement. Posterior circulation: Hypoplastic right vertebral artery supplying primarily the right PICA with minimal flow to the basilar artery. The dominant left vertebral artery has normal caliber flow related enhancement. Normal caliber and flow related enhancement of the basilar artery and right posterior cerebral artery. Diffusely small caliber of the P1 and proximal P2 segment of the fetal left PCA which remains patent. Anatomic variants: Hypoplastic left A1/ACA with azygous A2/ACA segment. Fetal left PCA. IMPRESSION: 1. Acute/subacute infarct in the posterior left MCA territory correlating with areas of hypodensity seen on recent head CT. No hemorrhagic transformation or significant mass effect. 2. Loss of flow related enhancement in the left MCA bifurcation may represent nonocclusive thrombus. 3. 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 4. Diffusely small caliber of the P1 and proximal P2 segment of the fetal left PCA which remains patent. 5. Moderate chronic microvascular ischemic changes of the white  matter. Electronically Signed   By: Baldemar Lenis M.D.   On: 01/25/2023 13:59   MR ANGIO HEAD WO CONTRAST  Result Date: 01/25/2023 CLINICAL DATA:  Stroke, follow-up. EXAM: MRI HEAD WITHOUT CONTRAST MRA HEAD WITHOUT CONTRAST TECHNIQUE: Multiplanar, multi-echo pulse sequences of the brain and surrounding structures were acquired without intravenous contrast. Angiographic images of the Circle of Willis were acquired using MRA technique without intravenous contrast. COMPARISON:  Head CT January 25, 2023. FINDINGS: MRI HEAD FINDINGS Brain: Confluent areas of restricted diffusion involving the insula, posterior frontal and parietal lobes, consistent with acute/subacute infarct in the posterior left MCA territory correlating with areas of hypodensity seen on recent head CT. No hemorrhagic transformation or significant mass effect. Scattered confluent foci of T2 hyperintensity are seen within the white matter of the cerebral hemispheres, nonspecific, most likely related to chronic small vessel ischemia. Mild parenchymal volume loss. Vascular: Susceptibility artifact is seen in the anterior left sylvian fissure and left parietal sulcus, most likely related to vessel thrombosis. Skull and upper cervical spine: Normal marrow signal. Sinuses/Orbits: Bilateral lens surgery. Paranasal sinuses are essentially clear. Bilateral mastoid effusion. Other: None. MRA HEAD FINDINGS Anterior circulation: Normal caliber and flow related enhancement of the bilateral intracranial ICAs. Focus of loss of flow related enhancement is seen in the left MCA bifurcation (series 3, image 95), may represent nonocclusive thrombus. Loss of flow related enhancement is also seen a proximal left M2/MCA posterior branch with reconstitution of flow at the distal M2 segment and subsequent loss of flow at the M3 level. The right MCA vascular tree remains patent. The azygous ACA has normal caliber and flow related enhancement. Posterior  circulation: Hypoplastic right vertebral artery supplying primarily the right PICA with minimal flow to the basilar artery. The  dominant left vertebral artery has normal caliber flow related enhancement. Normal caliber and flow related enhancement of the basilar artery and right posterior cerebral artery. Diffusely small caliber of the P1 and proximal P2 segment of the fetal left PCA which remains patent. Anatomic variants: Hypoplastic left A1/ACA with azygous A2/ACA segment. Fetal left PCA. IMPRESSION: 1. Acute/subacute infarct in the posterior left MCA territory correlating with areas of hypodensity seen on recent head CT. No hemorrhagic transformation or significant mass effect. 2. Loss of flow related enhancement in the left MCA bifurcation may represent nonocclusive thrombus. 3. 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 4. Diffusely small caliber of the P1 and proximal P2 segment of the fetal left PCA which remains patent. 5. Moderate chronic microvascular ischemic changes of the white matter. Electronically Signed   By: Baldemar Lenis M.D.   On: 01/25/2023 13:59   DG Chest Portable 1 View  Result Date: 01/25/2023 CLINICAL DATA:  Altered mental status. EXAM: PORTABLE CHEST 1 VIEW COMPARISON:  January 10, 2023. FINDINGS: The heart size and mediastinal contours are within normal limits. Both lungs are clear. Moderately displaced distal left clavicular fracture is again noted. IMPRESSION: No acute cardiopulmonary abnormality seen. Moderately displaced distal left clavicular fracture. Electronically Signed   By: Lupita Raider M.D.   On: 01/25/2023 13:30   CT HEAD CODE STROKE WO CONTRAST  Result Date: 01/25/2023 CLINICAL DATA:  Code stroke. Neuro deficit, acute, stroke suspected. Aphasia. EXAM: CT HEAD WITHOUT CONTRAST TECHNIQUE: Contiguous axial images were obtained from the base of the skull through the vertex without intravenous contrast. RADIATION DOSE REDUCTION: This exam was  performed according to the departmental dose-optimization program which includes automated exposure control, adjustment of the mA and/or kV according to patient size and/or use of iterative reconstruction technique. COMPARISON:  Head CT 01/08/2023 FINDINGS: Brain: There are acute to early subacute appearing infarcts in the left MCA territory involving the insula and cortex and white matter of the parietal lobe at the level of the operculum and corona radiata as well as more posterior superiorly in the parietal lobe. No intracranial hemorrhage, mass, midline shift, or extra-axial fluid collection is identified. Patchy hypodensities elsewhere in the cerebral white matter bilaterally are similar to the prior study and are nonspecific but compatible with chronic small vessel ischemic disease which is relatively mild for age. Mild cerebral atrophy is within normal limits for age. Vascular: Calcified atherosclerosis at the skull base. No hyperdense vessel. Skull: No acute fracture or suspicious osseous lesion. Sinuses/Orbits: Visualized paranasal sinuses are clear. Small right mastoid effusion. Bilateral cataract extraction. Other: None. ASPECTS [facility] Stroke Program Early CT Score) - Ganglionic level infarction (caudate, lentiform nuclei, internal capsule, insula, M1-M3 cortex): 5 - Supraganglionic infarction (M4-M6 cortex): 2 Total score (0-10 with 10 being normal): 7 These results were communicated to Dr. Selina Cooley at 11:02 am on 01/25/2023 by text page via the [facility] messaging system. 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 ASPECTS of 7. Electronically Signed   By: Sebastian Ache M.D.   On: 01/25/2023 11:03      Signature  -   Susa Raring M.D on 01/28/2023 at 9:06 AM   -  To page go to [email or web address]

## 2023-01-28 NOTE — Progress Notes (Signed)
Speech Language Pathology Treatment: Dysphagia  Patient Details Name: Jameya Klingberg MRN: [id number] DOB: [birth year] Today's Date: 01/28/2023 Time: 1300-1320 SLP Time Calculation (min) (ACUTE ONLY): 20 min  Assessment / Plan / Recommendation Clinical Impression  Able to visit pt while niece was at bedside. Pt had again reverted to fetal position on left side with pooled and dried secretions on the left. SLP cleaned up pt and repositioned her to visit with her family. Pt nodded yes to a spoon of applesauce. Pt has anterior spillage, oral holding and severe residue. SLP let niece feed the pt and she also observed the difficulty pt has in taking more than just a spoon of nutrition.  Pt refused after a few minutes of attempts.  We discussed the risk of aspiration and failure to thrive. Will sign off at this time as transition to comfort care expected.   HPI HPI: HPI: Bonnielou Steingraber is a 82 y.o. female with medical history significant of hypertension, hyperlipidemia, COPD, chronic diastolic congestive heart failure, CKD stage IIIa, dementia, and frequent falls who presented with complaints of difficulty speaking and right-sided weakness.  At baseline she could talk, walk without assistance although recommended to use walk, and care for herself.  She had just recently been hospitalized from 10/22-11/7 with acute hypoxic and hypercapnic respiratory failure thought secondary to COPD exacerbation and community acquired pneumonia.  There was concern for acute on chronic diastolic heart failure and patient was temporarily treated with Lasix.  Prior to the patient being transferred to the rehab facility her family had question if she had had a stroke because she seemed drastically different from baseline and had been having difficulty feeding herself which was unusual.  They had checked a CT scan of her head which was noted to be negative.  After she got to the facility due to patient having significant difficulty  speaking and weakness on her right side sent her to the hospital for further evaluation due to concern for stroke.     In the emergency department patient was seen as a code stroke.  Noted to be febrile up to 101.1 F with tachypnea.  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  Patient was noted not to be a thrombolytic candidate as unclear time of onset pressures elevated up to 189/58 all other vital signs maintained.  Labs significant for WBC 8.6, hemoglobin 10.6, sodium 146, BUN 29, creatinine 1.36, glucose 167.  Chest x-ray noted no acute cardiopulmonary abnormality but a moderately displaced distal left clavicle fracture. MRI of the brain significant for acute or subacute infarct in the posterior left MCA territory correlating with CT imaging.      SLP Plan  Continue with current plan of care      Recommendations for follow up therapy are one component of a multi-disciplinary discharge planning process, led by the attending physician.  Recommendations may be updated based on patient status, additional functional criteria and insurance authorization.    Recommendations  Diet recommendations: Other(comment) (comfort feeding - thin liquids) Medication Administration: Crushed with puree                  Oral care QID           Continue with current plan of care     Mckynzie Liwanag, Riley Nearing  01/28/2023, 1:23 PM

## 2023-01-29 DIAGNOSIS — Z515 Encounter for palliative care: Secondary | ICD-10-CM | POA: Diagnosis not present

## 2023-01-29 DIAGNOSIS — I63512 Cerebral infarction due to unspecified occlusion or stenosis of left middle cerebral artery: Secondary | ICD-10-CM | POA: Diagnosis not present

## 2023-01-29 LAB — CBC WITH DIFFERENTIAL/PLATELET
Abs Immature Granulocytes: 0.03 10*3/uL (ref 0.00–0.07)
Basophils Absolute: 0 10*3/uL (ref 0.0–0.1)
Basophils Relative: 1 %
Eosinophils Absolute: 0.2 10*3/uL (ref 0.0–0.5)
Eosinophils Relative: 4 %
HCT: 31 % — ABNORMAL LOW (ref 36.0–46.0)
Hemoglobin: 9.6 g/dL — ABNORMAL LOW (ref 12.0–15.0)
Immature Granulocytes: 1 %
Lymphocytes Relative: 18 %
Lymphs Abs: 1.1 10*3/uL (ref 0.7–4.0)
MCH: 30.3 pg (ref 26.0–34.0)
MCHC: 31 g/dL (ref 30.0–36.0)
MCV: 97.8 fL (ref 80.0–100.0)
Monocytes Absolute: 0.4 10*3/uL (ref 0.1–1.0)
Monocytes Relative: 7 %
Neutro Abs: 4.4 10*3/uL (ref 1.7–7.7)
Neutrophils Relative %: 69 %
Platelets: 261 10*3/uL (ref 150–400)
RBC: 3.17 MIL/uL — ABNORMAL LOW (ref 3.87–5.11)
RDW: 13.8 % (ref 11.5–15.5)
WBC: 6.3 10*3/uL (ref 4.0–10.5)
nRBC: 0 % (ref 0.0–0.2)

## 2023-01-29 LAB — BASIC METABOLIC PANEL
Anion gap: 9 (ref 5–15)
BUN: 22 mg/dL (ref 8–23)
CO2: 30 mmol/L (ref 22–32)
Calcium: 8.6 mg/dL — ABNORMAL LOW (ref 8.9–10.3)
Chloride: 108 mmol/L (ref 98–111)
Creatinine, Ser: 1.28 mg/dL — ABNORMAL HIGH (ref 0.44–1.00)
GFR, Estimated: 42 mL/min — ABNORMAL LOW (ref >60)
Glucose, Bld: 180 mg/dL — ABNORMAL HIGH (ref 70–99)
Potassium: 3.3 mmol/L — ABNORMAL LOW (ref 3.5–5.1)
Sodium: 147 mmol/L — ABNORMAL HIGH (ref 135–145)

## 2023-01-29 LAB — BRAIN NATRIURETIC PEPTIDE: B Natriuretic Peptide: 395 pg/mL — ABNORMAL HIGH (ref 0.0–100.0)

## 2023-01-29 LAB — PROCALCITONIN: Procalcitonin: <0.1 ng/mL

## 2023-01-29 LAB — C-REACTIVE PROTEIN: CRP: 4.2 mg/dL — ABNORMAL HIGH (ref <1.0)

## 2023-01-29 LAB — MAGNESIUM: Magnesium: 1.8 mg/dL (ref 1.7–2.4)

## 2023-01-29 LAB — GLUCOSE, CAPILLARY
Glucose-Capillary: 163 mg/dL — ABNORMAL HIGH (ref 70–99)
Glucose-Capillary: 128 mg/dL — ABNORMAL HIGH (ref 70–99)

## 2023-01-29 LAB — PHOSPHORUS: Phosphorus: 3.6 mg/dL (ref 2.5–4.6)

## 2023-01-29 MED ORDER — GLYCOPYRROLATE 0.2 MG/ML IJ SOLN: 0.2000 mg | INTRAMUSCULAR | Status: DC | PRN

## 2023-01-29 MED ORDER — INSULIN ASPART 100 UNIT/ML IJ SOLN
0.0000 [IU] | Freq: Three times a day (TID) | INTRAMUSCULAR | Status: DC
Start: 2023-01-29 — End: 2023-01-29

## 2023-01-29 MED ORDER — HALOPERIDOL 0.5 MG PO TABS: 0.5000 mg | ORAL_TABLET | ORAL | Status: DC | PRN

## 2023-01-29 MED ORDER — MORPHINE SULFATE (PF) 2 MG/ML IV SOLN
1.0000 mg | INTRAVENOUS | Status: DC | PRN
Start: 2023-01-29 — End: 2023-01-30

## 2023-01-29 MED ORDER — HALOPERIDOL LACTATE 5 MG/ML IJ SOLN: 0.5000 mg | INTRAMUSCULAR | Status: DC | PRN

## 2023-01-29 MED ORDER — POLYVINYL ALCOHOL 1.4 % OP SOLN: 1.0000 [drp] | Freq: Four times a day (QID) | OPHTHALMIC | Status: DC | PRN

## 2023-01-29 MED ORDER — BIOTENE DRY MOUTH MT LIQD: 15.0000 mL | OROMUCOSAL | Status: DC | PRN

## 2023-01-29 MED ORDER — GLYCOPYRROLATE 1 MG PO TABS: 1.0000 mg | ORAL_TABLET | ORAL | Status: DC | PRN

## 2023-01-29 MED ORDER — HALOPERIDOL LACTATE 2 MG/ML PO CONC: 0.5000 mg | ORAL | Status: DC | PRN

## 2023-01-29 NOTE — TOC Progression Note (Signed)
Transition of Care [facility]) - Progression Note    Patient Details  Name: Samantha Carpenter MRN: [id number] Date of Birth: Sep 06, 1940  Transition of [facility]) CM/SW Contact  Carley Hammed, LCSW Phone Number: 01/29/2023, 2:31 PM  Clinical Narrative:    Per Efraim Kaufmann with  [facility]) they have a bed and authorization. Family has decided to complete consents tomorrow. Hospice will follow back up when this is completed and pt can DC> TOC will continue to follow.    Expected Discharge Plan: Skilled Nursing Facility Barriers to Discharge: Continued Medical Work up, English as a second language teacher, SNF Pending bed offer  Expected Discharge Plan and Services In-house Referral: Clinical Social Work     Living arrangements for the past 2 months: Assisted Living Facility Expected Discharge Date: 01/29/23                                     Social Determinants of Health (SDOH) Interventions SDOH Screenings   Food Insecurity: Patient Unable To Answer (01/11/2023)  Housing: Patient Unable To Answer (01/11/2023)  Transportation Needs: No Transportation Needs (11/07/2022)  Utilities: Not At Risk (11/07/2022)  Financial Resource Strain: Low Risk  (09/02/2018)  Physical Activity: Sufficiently Active (09/02/2018)  Social Connections: Unknown (09/02/2018)  Stress: No Stress Concern Present (09/02/2018)  Tobacco Use: Medium Risk (01/08/2023)    Readmission Risk Interventions    01/26/2023   12:06 PM 01/11/2022    2:05 PM 01/09/2022    2:23 PM  Readmission Risk Prevention Plan  Transportation Screening Complete  Complete  HRI or Home Care Consult  Complete   Palliative Care Screening   Not Applicable  Medication Review (RN Care Manager) Complete  Complete  PCP or Specialist appointment within 3-5 days of discharge Complete    HRI or Home Care Consult Complete    SW Recovery Care/Counseling Consult Complete    Palliative Care Screening Not Applicable    Skilled Nursing Facility  Complete

## 2023-01-29 NOTE — Progress Notes (Signed)
Palliative Medicine Inpatient Follow Up Note HPI: 82 y.o. female with medical history significant of hypertension, hyperlipidemia, COPD, chronic diastolic congestive heart failure, CKD stage IIIa, dementia, and frequent falls who presented with complaints of difficulty speaking and right-sided weakness.  At baseline she could talk, walk without assistance although recommended to use walk, and care for herself.  She had just recently been hospitalized from 10/22-11/7 with acute hypoxic and hypercapnic respiratory failure thought secondary to COPD exacerbation and community acquired pneumonia.    She now presented to the hospital with high fevers, right-sided weakness, left-sided clavicular fracture, was diagnosed with pneumonia along with acute left MCA stroke.  She also developed severe encephalopathy and was admitted.   Palliative care asked to get involved to further support goals of care conversations.   Today's Discussion 01/29/2023  *Please note that this is a verbal dictation therefore any spelling or grammatical errors are due to the "Dragon [employer] One" system interpretation.  Chart reviewed inclusive of vital signs, progress notes, laboratory results, and diagnostic images.   I met with Samantha Carpenter at bedside this morning. She is able to open her eyes and tries to communicate with me though has expressive aphasia. She is aware of self it appears.   I met with patients RN, Samantha Carpenter who shares that the primary team has spoken to family and the decision has been made towards comfort care and hospice placement.  I called patients sister, Samantha Carpenter this afternoon. She shares with me that her daughter came yesterday and was able to see the severity of Samantha Carpenter's stroke first hand. Samantha Carpenter and I reviewed the plan for comfort care. We talked about transition to comfort measures in house and what that would entail inclusive of medications to control pain, dyspnea, agitation, nausea, itching, and hiccups.  We  discussed stopping all uneccessary measures such as cardiac monitoring, blood draws, needle sticks, and frequent vital signs.   She and I further discussed plan for inpatient hospice at the hospice home in [place] with [facility].   Questions and concerns addressed/Palliative Support Provided.   Objective Assessment: Vital Signs Vitals:   01/29/23 0900 01/29/23 1158  BP: (!) 185/99 (!) 142/54  Pulse:  (!) 104  Resp:  (!) 22  Temp:  99 F (37.2 C)  SpO2:  98%    Intake/Output Summary (Last 24 hours) at 01/29/2023 1502 Last data filed at 01/28/2023 1631 Gross per 24 hour  Intake --  Output 250 ml  Net -250 ml   Last Weight  Most recent update: 01/25/2023 10:46 AM    Weight  61.2 kg (134 lb 14.7 oz)            Gen: Elderly Caucasian female in moderate distress or moaning HEENT: Dry mucous membranes CV: Regular rate and rhythm  PULM: On 2LPM nasal cannula - breathing is even and nonlabored ABD: soft/nontender  EXT: R hemiparesis Neuro: Responds with mumbling  SUMMARY OF RECOMMENDATIONS   DNAR/DNI   Comfort focused care  Medications per [facility]  Plan for transition to inpatient hospice in [facility] tomorrow   PMT will continue to peripherally follow given clarity of goals  Billing based on MDM: High ______________________________________________________________________________________ [name] [place] Palliative Medicine Team Team Cell Phone: [phone] Please utilize secure chat with additional questions, if there is no response within 30 minutes please call the above phone number  Palliative Medicine Team providers are available by phone from 7am to 7pm daily and can be reached through the team cell phone.  Should  this patient require assistance outside of these hours, please call the patient's attending physician.

## 2023-01-29 NOTE — TOC Progression Note (Signed)
Transition of Care [facility]) - Progression Note    Patient Details  Name: Samantha Carpenter MRN: [id number] Date of Birth: Oct 26, 1940  [facility] CM/SW Contact  Mearl Latin, LCSW Phone Number: 01/29/2023, 8:55 AM  Clinical Narrative:    CSW awaiting referral decision and bed availability from [facility]).   Expected Discharge Plan: Skilled Nursing Facility Barriers to Discharge: Continued Medical Work up, English as a second language teacher, SNF Pending bed offer  Expected Discharge Plan and Services In-house Referral: Clinical Social Work     Living arrangements for the past 2 months: Assisted Living Facility                                       Social Determinants of Health (SDOH) Interventions SDOH Screenings   Food Insecurity: Patient Unable To Answer (01/11/2023)  Housing: Patient Unable To Answer (01/11/2023)  Transportation Needs: No Transportation Needs (11/07/2022)  Utilities: Not At Risk (11/07/2022)  Financial Resource Strain: Low Risk  (09/02/2018)  Physical Activity: Sufficiently Active (09/02/2018)  Social Connections: Unknown (09/02/2018)  Stress: No Stress Concern Present (09/02/2018)  Tobacco Use: Medium Risk (01/08/2023)    Readmission Risk Interventions    01/26/2023   12:06 PM 01/11/2022    2:05 PM 01/09/2022    2:23 PM  Readmission Risk Prevention Plan  Transportation Screening Complete  Complete  HRI or Home Care Consult  Complete   Palliative Care Screening   Not Applicable  Medication Review (RN Care Manager) Complete  Complete  PCP or Specialist appointment within 3-5 days of discharge Complete    HRI or Home Care Consult Complete    SW Recovery Care/Counseling Consult Complete    Palliative Care Screening Not Applicable    Skilled Nursing Facility Complete

## 2023-01-29 NOTE — Discharge Instructions (Signed)
Disposition.  Residential hospice °Condition.  Guarded °CODE STATUS.  DNR °Activity.  With assistance as tolerated, full fall precautions. °Diet.  Soft with feeding assistance and aspiration precautions. °Goal of care.  Comfort. ° °

## 2023-01-29 NOTE — Plan of Care (Signed)
Problem: Education: Goal: Knowledge of disease or condition will improve Outcome: Progressing Goal: Knowledge of secondary prevention will improve (MUST DOCUMENT ALL) Outcome: Progressing Goal: Knowledge of patient specific risk factors will improve Samantha Carpenter N/A or DELETE if not current risk factor) Outcome: Progressing   Problem: Ischemic Stroke/TIA Tissue Perfusion: Goal: Complications of ischemic stroke/TIA will be minimized Outcome: Progressing   Problem: Coping: Goal: Will verbalize positive feelings about self Outcome: Progressing Goal: Will identify appropriate support needs Outcome: Progressing   Problem: Health Behavior/Discharge Planning: Goal: Ability to manage health-related needs will improve Outcome: Progressing Goal: Goals will be collaboratively established with patient/family Outcome: Progressing   Problem: Self-Care: Goal: Ability to participate in self-care as condition permits will improve Outcome: Progressing Goal: Verbalization of feelings and concerns over difficulty with self-care will improve Outcome: Progressing Goal: Ability to communicate needs accurately will improve Outcome: Progressing   Problem: Nutrition: Goal: Risk of aspiration will decrease Outcome: Progressing Goal: Dietary intake will improve Outcome: Progressing   Problem: Education: Goal: Knowledge of disease or condition will improve Outcome: Progressing Goal: Knowledge of secondary prevention will improve (MUST DOCUMENT ALL) Outcome: Progressing Goal: Knowledge of patient specific risk factors will improve Samantha Carpenter N/A or DELETE if not current risk factor) Outcome: Progressing   Problem: Ischemic Stroke/TIA Tissue Perfusion: Goal: Complications of ischemic stroke/TIA will be minimized Outcome: Progressing   Problem: Coping: Goal: Will verbalize positive feelings about self Outcome: Progressing Goal: Will identify appropriate support needs Outcome: Progressing   Problem:  Health Behavior/Discharge Planning: Goal: Ability to manage health-related needs will improve Outcome: Progressing Goal: Goals will be collaboratively established with patient/family Outcome: Progressing   Problem: Self-Care: Goal: Ability to participate in self-care as condition permits will improve Outcome: Progressing Goal: Verbalization of feelings and concerns over difficulty with self-care will improve Outcome: Progressing Goal: Ability to communicate needs accurately will improve Outcome: Progressing   Problem: Nutrition: Goal: Risk of aspiration will decrease Outcome: Progressing Goal: Dietary intake will improve Outcome: Progressing   Problem: Education: Goal: Knowledge of General Education information will improve Description: Including pain rating scale, medication(s)/side effects and non-pharmacologic comfort measures Outcome: Progressing   Problem: Health Behavior/Discharge Planning: Goal: Ability to manage health-related needs will improve Outcome: Progressing   Problem: Clinical Measurements: Goal: Ability to maintain clinical measurements within normal limits will improve Outcome: Progressing Goal: Will remain free from infection Outcome: Progressing Goal: Diagnostic test results will improve Outcome: Progressing Goal: Respiratory complications will improve Outcome: Progressing Goal: Cardiovascular complication will be avoided Outcome: Progressing   Problem: Activity: Goal: Risk for activity intolerance will decrease Outcome: Progressing   Problem: Nutrition: Goal: Adequate nutrition will be maintained Outcome: Progressing   Problem: Coping: Goal: Level of anxiety will decrease Outcome: Progressing   Problem: Elimination: Goal: Will not experience complications related to bowel motility Outcome: Progressing Goal: Will not experience complications related to urinary retention Outcome: Progressing   Problem: Pain Management: Goal: General  experience of comfort will improve Outcome: Progressing   Problem: Safety: Goal: Ability to remain free from injury will improve Outcome: Progressing   Problem: Skin Integrity: Goal: Risk for impaired skin integrity will decrease Outcome: Progressing   Problem: Education: Goal: Ability to describe self-care measures that may prevent or decrease complications (Diabetes Survival Skills Education) will improve Outcome: Progressing Goal: Individualized Educational Video(s) Outcome: Progressing   Problem: Coping: Goal: Ability to adjust to condition or change in health will improve Outcome: Progressing   Problem: Fluid Volume: Goal: Ability to maintain a balanced intake and output will  improve Outcome: Progressing   Problem: Health Behavior/Discharge Planning: Goal: Ability to identify and utilize available resources and services will improve Outcome: Progressing Goal: Ability to manage health-related needs will improve Outcome: Progressing   Problem: Metabolic: Goal: Ability to maintain appropriate glucose levels will improve Outcome: Progressing

## 2023-01-29 NOTE — Progress Notes (Signed)
Per Thedore Mins, MD okay to d/c tele with pt going comfort care

## 2023-01-29 NOTE — Discharge Summary (Signed)
Samantha Carpenter NID:[id number] DOB: 1940-09-16 DOA: 01/25/2023  PCP: Pcp, No  Admit date: 01/25/2023  Discharge date: 01/29/2023  Admitted From: Home   Disposition:  Hospice   Recommendations for Outpatient Follow-up:   Follow up with PCP in 1-2 weeks  PCP Please obtain BMP/CBC, 2 view CXR in 1week,  (see Discharge instructions)   PCP Please follow up on the following pending results:    Home Health: None   Equipment/Devices: None  Consultations: Neuro, palliative care Discharge Condition: Guarded  CODE STATUS: DNR Diet Recommendation: For comfort soft diet with feeding assistance and aspiration precautions, very high risk for aspiration.   Chief Complaint  Patient presents with   Code Stroke     Brief history of present illness from the day of admission and additional interim summary    82 y.o. female with medical history significant of hypertension, hyperlipidemia, COPD, chronic diastolic congestive heart failure, CKD stage IIIa, dementia, and frequent falls who presented with complaints of difficulty speaking and right-sided weakness.  At baseline she could talk, walk without assistance although recommended to use walk, and care for herself.  She had just recently been hospitalized from 10/22-11/7 with acute hypoxic and hypercapnic respiratory failure thought secondary to COPD exacerbation and community acquired pneumonia.    She now presented to the hospital with high fevers, right-sided weakness, left-sided clavicular fracture, was diagnosed with pneumonia along with acute left MCA stroke.  She also developed severe encephalopathy and was admitted.                                                                 Hospital Course   Stroke   Subacute.  Patient reportedly had been having difficulty with  speech and right-sided weakness.  Symptoms had possibly been present prior to the patient going to rehab yesterday. 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  Seen by stroke team, stroke workup per neurology, currently on aspirin, will place on low-dose statin for better LDL control, carotid ultrasound noted but patient is not an operative candidate, extremely weak and frail with dementia and severe delirium now.    She continued to have dysphagia and delirium, unable to eat food, poor candidate for tube feeds, had long discussion with patient's sister on 01/28/2023, she understood the poor state patient was then, she was also seen by palliative care.  She be transition to comfort measures with hospice placement, goal of care is comfort only now.  Only comfort directed medications.    Sepsis due to pneumonia present on admission.  High suspicion for aspiration pneumonia due to new stroke, will switch to Unasyn for antibiotics, speech therapy following, aspiration precautions.  This problem has resolved but remains very high risk for repeat aspiration and repeat pneumonia.   Severe metabolic encephalopathy with underlying dementia.  Supportive care.  Minimize narcotics and benzodiazepines.  Involve palliative care for long-term goals of care.  EEG nonacute.   Dysphagia due to combination of stroke and encephalopathy.  Failed speech eval, kindly see above.   Dehydration and hypernatremia.  Hydrated here with IV fluids now on comfort care with hospice   COPD exacerbation No wheezing, supportive care.   Clavicle fracture secondary to fall Bilateral knee contusions Suspect prior to arrival.  On exam patient with bruising of the bilateral knees thought secondary to falls.  left arm placed in sling, not wait bearing in left arm.   Essential hypertension Now on comfort medications.   Chronic diastolic congestive heart failure EF 60% Chronic compensated   Chronic kidney disease stage  IIIb Creatinine noted to be 1.36 with BUN 29.  Creatinine appears near patient's baseline.    Macrocytic anemia Chronic.  Hemoglobin 10.6 with MCV 100.6.  Hemoglobin appears stable.     Dementia -Abortive care directed towards comfort   Prediabetes On admission glucose 172.  Last available hemoglobin A1c was 6.1.  Now on comfort care.  Discharge diagnosis     Principal Problem:   CVA (cerebral vascular accident) (HCC) Active Problems:   SIRS (systemic inflammatory response syndrome) (HCC)   COPD with acute exacerbation (HCC)   Clavicle fracture   Falls   Knee contusion   HTN (hypertension)   Chronic diastolic CHF (congestive heart failure) (HCC)   CKD stage 3a, GFR 45-59 ml/min (HCC)   Prediabetes   Macrocytic anemia    Discharge instructions    Discharge Instructions     Ambulatory referral to Neurology   Complete by: As directed    Follow up with stroke clinic NP (Jessica Vanschaick or Darrol Angel, if both not available, consider Manson Allan, or Ahern) at [facility] in about 4 weeks. Thanks.   Discharge instructions   Complete by: As directed    Disposition.  Residential hospice Condition.  Guarded CODE STATUS.  DNR Activity.  With assistance as tolerated, full fall precautions. Diet.  Soft with feeding assistance and aspiration precautions. Goal of care.  Comfort.   Increase activity slowly   Complete by: As directed    No wound care   Complete by: As directed        Discharge Medications   Allergies as of 01/29/2023       Reactions   Morphine And Codeine    Latex Rash   "I break out"        Medication List     STOP taking these medications    aspirin EC 81 MG tablet   bisoprolol 5 MG tablet Commonly known as: ZEBETA   clonazePAM 0.5 MG tablet Commonly known as: KLONOPIN   DULoxetine 60 MG capsule Commonly known as: CYMBALTA   famotidine 20 MG tablet Commonly known as: Pepcid   furosemide 20 MG tablet Commonly known as:  LASIX   gabapentin 100 MG capsule Commonly known as: NEURONTIN   haloperidol 2 MG tablet Commonly known as: HALDOL   hydrALAZINE 50 MG tablet Commonly known as: APRESOLINE   losartan 100 MG tablet Commonly known as: COZAAR   mouth rinse Liqd solution   Naloxone HCl 4 MG/0.25ML Liqd   OLANZapine 5 MG tablet Commonly known as: ZYPREXA   OXcarbazepine 150 MG tablet Commonly known as: TRILEPTAL       TAKE these medications    Acetaminophen Extra Strength 500 MG Tabs Take 500 mg by mouth every 4 (four) hours as needed (pain).  albuterol 108 (90 Base) MCG/ACT inhaler Commonly known as: VENTOLIN HFA Inhale 2 puffs into the lungs every 6 (six) hours as needed for wheezing or shortness of breath.   fluticasone-salmeterol 250-50 MCG/ACT Aepb Commonly known as: ADVAIR Inhale 1 puff into the lungs in the morning and at bedtime.   ipratropium-albuterol 0.5-2.5 (3) MG/3ML Soln Commonly known as: DUONEB Take 3 mLs by nebulization in the morning, at noon, and at bedtime.   OXYGEN Inhale 2 L into the lungs continuous.   polyethylene glycol 17 g packet Commonly known as: MIRALAX / GLYCOLAX Take 17 g by mouth daily.   rivastigmine 1.5 MG capsule Commonly known as: EXELON Take 1.5 mg by mouth 2 (two) times daily.   Senna Plus 8.6-50 MG tablet Generic drug: senna-docusate Take 1 tablet by mouth at bedtime.   Spiriva Respimat 2.5 MCG/ACT Aers Generic drug: Tiotropium Bromide Monohydrate Inhale 2 puffs into the lungs daily.         Follow-up Information     [facility]. Schedule an appointment as soon as possible for a visit in 1 month(s).   Specialty: Neurology Why: stroke clinic Contact information: [address] [place] [address] [phone]                Major procedures and Radiology Reports - PLEASE review detailed and final reports thoroughly  -      VAS [place] LOWER EXTREMITY VENOUS  (DVT)  Result Date: 01/28/2023  Lower Venous DVT Study Patient Name:  LENOIR LEGG  Date of Exam:   01/27/2023 Medical Rec #: [id number]      Accession #:    [id number] Date of Birth: 04-08-1940     Patient Gender: F Patient Age:   21 years Exam Location:  [facility] Procedure:      VAS [place] LOWER EXTREMITY VENOUS (DVT) Referring Phys: Scheryl Marten XU --------------------------------------------------------------------------------  Indications: Stroke.  Risk Factors: None identified. Limitations: Poor ultrasound/tissue interface and patient positioning, patient immobility. Comparison Study: No prior studies. Performing Technologist: Chanda Busing RVT  Examination Guidelines: A complete evaluation includes B-mode imaging, spectral Doppler, color Doppler, and power Doppler as needed of all accessible portions of each vessel. Bilateral testing is considered an integral part of a complete examination. Limited examinations for reoccurring indications may be performed as noted. The reflux portion of the exam is performed with the patient in reverse Trendelenburg.  +---------+---------------+---------+-----------+----------+--------------+ RIGHT    CompressibilityPhasicitySpontaneityPropertiesThrombus Aging +---------+---------------+---------+-----------+----------+--------------+ CFV      Full           Yes      Yes                                 +---------+---------------+---------+-----------+----------+--------------+ SFJ      Full                                                        +---------+---------------+---------+-----------+----------+--------------+ FV Prox  Full                                                        +---------+---------------+---------+-----------+----------+--------------+  FV Mid   Full                                                        +---------+---------------+---------+-----------+----------+--------------+ FV DistalFull                                                         +---------+---------------+---------+-----------+----------+--------------+ PFV      Full                                                        +---------+---------------+---------+-----------+----------+--------------+ POP      Full           Yes      Yes                                 +---------+---------------+---------+-----------+----------+--------------+ PTV      Full                                                        +---------+---------------+---------+-----------+----------+--------------+ PERO     Full                                                        +---------+---------------+---------+-----------+----------+--------------+   +---------+---------------+---------+-----------+----------+-------------------+ LEFT     CompressibilityPhasicitySpontaneityPropertiesThrombus Aging      +---------+---------------+---------+-----------+----------+-------------------+ CFV      Full           Yes      Yes                                      +---------+---------------+---------+-----------+----------+-------------------+ SFJ      Full                                                             +---------+---------------+---------+-----------+----------+-------------------+ FV Prox  Full                                                             +---------+---------------+---------+-----------+----------+-------------------+ FV Mid   Full                                                             +---------+---------------+---------+-----------+----------+-------------------+  FV DistalFull                                                             +---------+---------------+---------+-----------+----------+-------------------+ PFV      Full                                                             +---------+---------------+---------+-----------+----------+-------------------+ POP       Full           Yes      Yes                                      +---------+---------------+---------+-----------+----------+-------------------+ PTV      Full                                                             +---------+---------------+---------+-----------+----------+-------------------+ PERO                                                  Not well visualized +---------+---------------+---------+-----------+----------+-------------------+     Summary: RIGHT: - There is no evidence of deep vein thrombosis in the lower extremity. However, portions of this examination were limited- see technologist comments above.  - No cystic structure found in the popliteal fossa.  LEFT: - There is no evidence of deep vein thrombosis in the lower extremity. However, portions of this examination were limited- see technologist comments above.  - No cystic structure found in the popliteal fossa.  *See table(s) above for measurements and observations. Electronically signed by Carolynn Sayers on 01/28/2023 at 4:37:14 PM.    Final    DG Chest Port 1 View  Result Date: 01/27/2023 CLINICAL DATA:  Shortness of breath. EXAM: PORTABLE CHEST 1 VIEW COMPARISON:  January 26, 2023. FINDINGS: The heart size and mediastinal contours are within normal limits. Right lung is clear. Minimal left basilar subsegmental atelectasis or infiltrate is noted. The visualized skeletal structures are unremarkable. IMPRESSION: Minimal left basilar subsegmental atelectasis or infiltrate. Electronically Signed   By: Lupita Raider M.D.   On: 01/27/2023 08:03   VAS US CAROTID (at [facility] and [facility] only)  Result Date: 01/26/2023 Carotid Arterial Duplex Study Patient Name:  AGUEDA WINBUSH  Date of Exam:   01/25/2023 Medical Rec #: [id number]      Accession #:    [id number] Date of Birth: 1941-01-19     Patient Gender: F Patient Age:   64 years Exam Location:  [facility] Procedure:      VAS US CAROTID Referring Phys: Dewitt Hoes DE  LA TORRE --------------------------------------------------------------------------------  Indications:       CVA. Risk Factors:      Hypertension. Limitations  Today's exam was limited due to the patient's respiratory                    variation and patient movement and talking. Comparison Study:  No prior exam. Performing Technologist: Fernande Bras  Examination Guidelines: A complete evaluation includes B-mode imaging, spectral Doppler, color Doppler, and power Doppler as needed of all accessible portions of each vessel. Bilateral testing is considered an integral part of a complete examination. Limited examinations for reoccurring indications may be performed as noted.  Right Carotid Findings: +----------+--------+--------+--------+------------------+--------+           PSV cm/sEDV cm/sStenosisPlaque DescriptionComments +----------+--------+--------+--------+------------------+--------+ CCA Prox  88      18                                         +----------+--------+--------+--------+------------------+--------+ CCA Distal80      12                                         +----------+--------+--------+--------+------------------+--------+ ICA Prox  53      16                                         +----------+--------+--------+--------+------------------+--------+ ICA Mid   55      17                                         +----------+--------+--------+--------+------------------+--------+ ICA Distal80      20                                         +----------+--------+--------+--------+------------------+--------+ ECA       123     12                                         +----------+--------+--------+--------+------------------+--------+ +----------+--------+-------+--------+-------------------+           PSV cm/sEDV cmsDescribeArm Pressure (mmHG) +----------+--------+-------+--------+-------------------+ [id number]      11                                  +----------+--------+-------+--------+-------------------+ +---------+--------+--+--------+-+ VertebralPSV cm/s53EDV cm/s9 +---------+--------+--+--------+-+ Limited view of right vertebral. Left Carotid Findings: +----------+--------+--------+--------+-------------------------+--------+           PSV cm/sEDV cm/sStenosisPlaque Description       Comments +----------+--------+--------+--------+-------------------------+--------+ CCA Prox  122     22                                                +----------+--------+--------+--------+-------------------------+--------+ CCA Distal96      16                                                +----------+--------+--------+--------+-------------------------+--------+  ICA Prox  129     18              heterogenous and calcific         +----------+--------+--------+--------+-------------------------+--------+ ICA Mid   87      22                                                +----------+--------+--------+--------+-------------------------+--------+ ICA Distal103     30                                                +----------+--------+--------+--------+-------------------------+--------+ ECA       395     31                                                +----------+--------+--------+--------+-------------------------+--------+ +----------+--------+--------+--------+-------------------+           PSV cm/sEDV cm/sDescribeArm Pressure (mmHG) +----------+--------+--------+--------+-------------------+ Subclavian101     21                                  +----------+--------+--------+--------+-------------------+ +---------+--------+--+--------+--+ VertebralPSV cm/s96EDV cm/s16 +---------+--------+--+--------+--+   Summary: Right Carotid: Velocities in the right ICA are consistent with a 1-39% stenosis.                Non-hemodynamically significant plaque <50% noted in the CCA.  The                ECA appears >50% stenosed. Left Carotid: Velocities in the left ICA are consistent with a 1-39% stenosis.               The ECA appears <50% stenosed. Vertebrals:  Bilateral vertebral arteries demonstrate antegrade flow. Subclavians: Normal flow hemodynamics were seen in bilateral subclavian              arteries. *See table(s) above for measurements and observations.  Electronically signed by Carolynn Sayers on 01/26/2023 at 11:37:40 AM.    Final    DG Chest Port 1 View  Result Date: 01/26/2023 CLINICAL DATA:  Shortness of breath. EXAM: PORTABLE CHEST 1 VIEW COMPARISON:  01/25/2023 FINDINGS: Lungs are hyperexpanded. Cardiopericardial silhouette is at upper limits of normal for size. Interstitial markings are diffusely coarsened with chronic features. Patchy airspace disease again noted left lung base. Nodular density overlying the right lower lung likely represents focal calcification in anterior costal cartilage seen on recent chest CT. No substantial pleural effusion. Telemetry leads overlie the chest. IMPRESSION: 1. Hyperexpansion with chronic interstitial coarsening. 2. Patchy airspace disease left lung base, similar to prior. Electronically Signed   By: Kennith Center M.D.   On: 01/26/2023 08:00   DG Knee 1-2 Views Left  Result Date: 01/25/2023 CLINICAL DATA:  Left knee bruising. EXAM: LEFT KNEE - 1-2 VIEW COMPARISON:  None Available. FINDINGS: No fracture or dislocation. Mild medial tibiofemoral joint space narrowing. Mild peripheral spurring. Small quadriceps tendon enthesophyte. No significant knee joint effusion. No erosion or focal bone abnormality. No focal soft tissue abnormalities are seen. IMPRESSION: Mild osteoarthritis. Electronically Signed  By: Narda Rutherford M.D.   On: 01/25/2023 22:58   DG Knee 1-2 Views Right  Result Date: 01/25/2023 CLINICAL DATA:  Bruising on both knees. EXAM: RIGHT KNEE - 1-2 VIEW COMPARISON:  None Available. FINDINGS: No fracture or  dislocation. Tricompartmental osteoarthritis most prominent in the medial tibiofemoral compartment. No significant knee joint effusion. No erosions or focal bone abnormality. No focal soft tissue abnormality. 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 Electronically Signed   By: Narda Rutherford M.D.   On: 01/25/2023 22:52   ECHOCARDIOGRAM COMPLETE  Result Date: 01/25/2023    ECHOCARDIOGRAM REPORT   Patient Name:   Ofa Issa Date of Exam: 01/25/2023 Medical Rec #:  [id number]     Height:       64.0 in Accession #:    [id number]    Weight:       134.9 lb Date of Birth:  [birth year]    BSA:          1.655 m Patient Age:    11 years      BP:           163/59 mmHg Patient Gender: F             HR:           69 bpm. Exam Location:  Inpatient Procedure: 2D Echo, Cardiac Doppler and Color Doppler Indications:    Stroke  History:        Patient has prior history of Echocardiogram examinations, most                 recent 04/18/2022. CHF, COPD and CKD, stage 3; Risk                 Factors:Hypertension.  Sonographer:    Lucendia Herrlich RCS Referring Phys: Lennox Solders DE LA TORRE IMPRESSIONS  1. Left ventricular ejection fraction, by estimation, is 70 to 75%. The left ventricle has hyperdynamic function. The left ventricle has no regional wall motion abnormalities. Left ventricular diastolic function could not be evaluated.  2. Right ventricular systolic function is normal. The right ventricular size is normal.  3. The mitral valve is grossly normal. No evidence of mitral valve regurgitation.  4. The aortic valve has an indeterminant number of cusps. Aortic valve regurgitation is not visualized. No aortic stenosis is present.  5. The inferior vena cava is normal in size with greater than 50% respiratory variability, suggesting right atrial pressure of 3 mmHg. Comparison(s): The left ventricular function is unchanged. FINDINGS  Left Ventricle: Left ventricular  ejection fraction, by estimation, is 70 to 75%. The left ventricle has hyperdynamic function. The left ventricle has no regional wall motion abnormalities. The left ventricular internal cavity size was normal in size. There is no left ventricular hypertrophy. Left ventricular diastolic function could not be evaluated. Right Ventricle: The right ventricular size is normal. Right vetricular wall thickness was not assessed. Right ventricular systolic function is normal. Left Atrium: Left atrial size was normal in size. Right Atrium: Right atrial size was normal in size. Pericardium: There is no evidence of pericardial effusion. Mitral Valve: The mitral valve is grossly normal. No evidence of mitral valve regurgitation. Tricuspid Valve: The tricuspid valve is normal in structure. Tricuspid valve regurgitation is trivial. Aortic Valve: The aortic valve has an indeterminant number of cusps. Aortic valve regurgitation is not visualized. No aortic stenosis is present. Aortic valve peak gradient measures 6.9 mmHg. Pulmonic Valve: The pulmonic valve was normal in  structure. Pulmonic valve regurgitation is not visualized. Aorta: The aortic root and ascending aorta are structurally normal, with no evidence of dilitation. Venous: The inferior vena cava is normal in size with greater than 50% respiratory variability, suggesting right atrial pressure of 3 mmHg. IAS/Shunts: No atrial level shunt detected by color flow Doppler.  LEFT VENTRICLE PLAX 2D LVIDd:         4.20 cm   Diastology LVIDs:         2.80 cm   LV e' medial:    4.24 cm/s LV PW:         0.80 cm   LV E/e' medial:  18.5 LV IVS:        0.80 cm   LV e' lateral:   3.92 cm/s LVOT diam:     1.80 cm   LV E/e' lateral: 20.0 LV SV:         49 LV SV Index:   29 LVOT Area:     2.54 cm  RIGHT VENTRICLE             IVC RV S prime:     11.70 cm/s  IVC diam: 1.60 cm TAPSE (M-mode): 2.8 cm LEFT ATRIUM             Index        RIGHT ATRIUM           Index LA diam:        2.70 cm 1.63  cm/m   RA Area:     11.10 cm LA Vol (A2C):   30.0 ml 18.13 ml/m  RA Volume:   22.00 ml  13.29 ml/m LA Vol (A4C):   26.1 ml 15.77 ml/m LA Biplane Vol: 31.2 ml 18.85 ml/m  AORTIC VALVE AV Area (Vmax): 1.96 cm AV Vmax:        131.00 cm/s AV Peak Grad:   6.9 mmHg LVOT Vmax:      101.00 cm/s LVOT Vmean:     64.300 cm/s LVOT VTI:       0.191 m  AORTA Ao Root diam: 3.10 cm Ao Asc diam:  2.80 cm MITRAL VALVE               TRICUSPID VALVE MV Area (PHT): 2.45 cm    TR Peak grad:   4.9 mmHg MV Decel Time: 310 msec    TR Vmax:        111.00 cm/s MV E velocity: 78.40 cm/s MV A velocity: 97.70 cm/s  SHUNTS MV E/A ratio:  0.80        Systemic VTI:  0.19 m                            Systemic Diam: 1.80 cm Dietrich Pates MD Electronically signed by Dietrich Pates MD Signature Date/Time: 01/25/2023/4:52:48 PM    Final    EEG adult  Result Date: 01/25/2023 Charlsie Quest, MD     01/25/2023  4:52 PM Patient Name: Rynleigh Staal MRN: [id number] Epilepsy Attending: Charlsie Quest Referring Physician/Provider: Ernestina Columbia, Lennox Solders, N Date: 01/25/2023 Duration: 25.40 mins Patient history: 82yo F with right sided weakness getting eeg to evaluate for seizure Level of alertness: Awake AEDs during EEG study: None Technical aspects: This EEG study was done with scalp electrodes positioned according to the 10-20 International system of electrode placement. Electrical activity was reviewed with band pass filter of 1-70Hz , sensitivity of 7 uV/mm, display speed of  3mm/sec with a 60Hz  notched filter applied as appropriate. EEG data were recorded continuously and digitally stored.  Video monitoring was available and reviewed as appropriate. Description: The posterior dominant rhythm consists of 8 Hz activity of moderate voltage (25-35 uV) seen predominantly in posterior head regions, symmetric and reactive to eye opening and eye closing. EEG showed intermittent generalized 3 to 6 Hz theta-delta slowing. Sharp transients were noted in  left>right posterior quadrant. Hyperventilation and photic stimulation were not performed.   Patient was noted to have chewing like movements intermittently. Concomitant eeg before, during and after the event didn't show any eeg change to suggest seizure. ABNORMALITY - Intermittent slow, generalized IMPRESSION: This study is suggestive of mild diffuse encephalopathy. No seizures or definite epileptiform discharges were seen throughout the recording. Patient was noted to have chewing like movements intermittently without concomitant eeg change. These events are non-epileptic. Charlsie Quest   MR BRAIN WO CONTRAST  Result Date: 01/25/2023 CLINICAL DATA:  Stroke, follow-up. EXAM: MRI HEAD WITHOUT CONTRAST MRA HEAD WITHOUT CONTRAST TECHNIQUE: Multiplanar, multi-echo pulse sequences of the brain and surrounding structures were acquired without intravenous contrast. Angiographic images of the Circle of Willis were acquired using MRA technique without intravenous contrast. COMPARISON:  Head CT January 25, 2023. FINDINGS: MRI HEAD FINDINGS Brain: Confluent areas of restricted diffusion involving the insula, posterior frontal and parietal lobes, consistent with acute/subacute infarct in the posterior left MCA territory correlating with areas of hypodensity seen on recent head CT. No hemorrhagic transformation or significant mass effect. Scattered confluent foci of T2 hyperintensity are seen within the white matter of the cerebral hemispheres, nonspecific, most likely related to chronic small vessel ischemia. Mild parenchymal volume loss. Vascular: Susceptibility artifact is seen in the anterior left sylvian fissure and left parietal sulcus, most likely related to vessel thrombosis. Skull and upper cervical spine: Normal marrow signal. Sinuses/Orbits: Bilateral lens surgery. Paranasal sinuses are essentially clear. Bilateral mastoid effusion. Other: None. MRA HEAD FINDINGS Anterior circulation: Normal caliber and flow  related enhancement of the bilateral intracranial ICAs. Focus of loss of flow related enhancement is seen in the left MCA bifurcation (series 3, image 95), may represent nonocclusive thrombus. Loss of flow related enhancement is also seen a proximal left M2/MCA posterior branch with reconstitution of flow at the distal M2 segment and subsequent loss of flow at the M3 level. The right MCA vascular tree remains patent. The azygous ACA has normal caliber and flow related enhancement. Posterior circulation: Hypoplastic right vertebral artery supplying primarily the right PICA with minimal flow to the basilar artery. The dominant left vertebral artery has normal caliber flow related enhancement. Normal caliber and flow related enhancement of the basilar artery and right posterior cerebral artery. Diffusely small caliber of the P1 and proximal P2 segment of the fetal left PCA which remains patent. Anatomic variants: Hypoplastic left A1/ACA with azygous A2/ACA segment. Fetal left PCA. IMPRESSION: 1. Acute/subacute infarct in the posterior left MCA territory correlating with areas of hypodensity seen on recent head CT. No hemorrhagic transformation or significant mass effect. 2. Loss of flow related enhancement in the left MCA bifurcation may represent nonocclusive thrombus. 3. 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 4. Diffusely small caliber of the P1 and proximal P2 segment of the fetal left PCA which remains patent. 5. Moderate chronic microvascular ischemic changes of the white matter. Electronically Signed   By: Baldemar Lenis M.D.   On: 01/25/2023 13:59   MR ANGIO HEAD WO CONTRAST  Result  Date: 01/25/2023 CLINICAL DATA:  Stroke, follow-up. EXAM: MRI HEAD WITHOUT CONTRAST MRA HEAD WITHOUT CONTRAST TECHNIQUE: Multiplanar, multi-echo pulse sequences of the brain and surrounding structures were acquired without intravenous contrast. Angiographic images of the Circle of Willis were acquired  using MRA technique without intravenous contrast. COMPARISON:  Head CT January 25, 2023. FINDINGS: MRI HEAD FINDINGS Brain: Confluent areas of restricted diffusion involving the insula, posterior frontal and parietal lobes, consistent with acute/subacute infarct in the posterior left MCA territory correlating with areas of hypodensity seen on recent head CT. No hemorrhagic transformation or significant mass effect. Scattered confluent foci of T2 hyperintensity are seen within the white matter of the cerebral hemispheres, nonspecific, most likely related to chronic small vessel ischemia. Mild parenchymal volume loss. Vascular: Susceptibility artifact is seen in the anterior left sylvian fissure and left parietal sulcus, most likely related to vessel thrombosis. Skull and upper cervical spine: Normal marrow signal. Sinuses/Orbits: Bilateral lens surgery. Paranasal sinuses are essentially clear. Bilateral mastoid effusion. Other: None. MRA HEAD FINDINGS Anterior circulation: Normal caliber and flow related enhancement of the bilateral intracranial ICAs. Focus of loss of flow related enhancement is seen in the left MCA bifurcation (series 3, image 95), may represent nonocclusive thrombus. Loss of flow related enhancement is also seen a proximal left M2/MCA posterior branch with reconstitution of flow at the distal M2 segment and subsequent loss of flow at the M3 level. The right MCA vascular tree remains patent. The azygous ACA has normal caliber and flow related enhancement. Posterior circulation: Hypoplastic right vertebral artery supplying primarily the right PICA with minimal flow to the basilar artery. The dominant left vertebral artery has normal caliber flow related enhancement. Normal caliber and flow related enhancement of the basilar artery and right posterior cerebral artery. Diffusely small caliber of the P1 and proximal P2 segment of the fetal left PCA which remains patent. Anatomic variants: Hypoplastic  left A1/ACA with azygous A2/ACA segment. Fetal left PCA. IMPRESSION: 1. Acute/subacute infarct in the posterior left MCA territory correlating with areas of hypodensity seen on recent head CT. No hemorrhagic transformation or significant mass effect. 2. Loss of flow related enhancement in the left MCA bifurcation may represent nonocclusive thrombus. 3. 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 4. Diffusely small caliber of the P1 and proximal P2 segment of the fetal left PCA which remains patent. 5. Moderate chronic microvascular ischemic changes of the white matter. Electronically Signed   By: Baldemar Lenis M.D.   On: 01/25/2023 13:59   DG Chest Portable 1 View  Result Date: 01/25/2023 CLINICAL DATA:  Altered mental status. EXAM: PORTABLE CHEST 1 VIEW COMPARISON:  January 10, 2023. FINDINGS: The heart size and mediastinal contours are within normal limits. Both lungs are clear. Moderately displaced distal left clavicular fracture is again noted. IMPRESSION: No acute cardiopulmonary abnormality seen. Moderately displaced distal left clavicular fracture. Electronically Signed   By: Lupita Raider M.D.   On: 01/25/2023 13:30   CT HEAD CODE STROKE WO CONTRAST  Result Date: 01/25/2023 CLINICAL DATA:  Code stroke. Neuro deficit, acute, stroke suspected. Aphasia. EXAM: CT HEAD WITHOUT CONTRAST TECHNIQUE: Contiguous axial images were obtained from the base of the skull through the vertex without intravenous contrast. RADIATION DOSE REDUCTION: This exam was performed according to the departmental dose-optimization program which includes automated exposure control, adjustment of the mA and/or kV according to patient size and/or use of iterative reconstruction technique. COMPARISON:  Head CT 01/08/2023 FINDINGS: Brain: There are acute to early  subacute appearing infarcts in the left MCA territory involving the insula and cortex and white matter of the parietal lobe at the level of the  operculum and corona radiata as well as more posterior superiorly in the parietal lobe. No intracranial hemorrhage, mass, midline shift, or extra-axial fluid collection is identified. Patchy hypodensities elsewhere in the cerebral white matter bilaterally are similar to the prior study and are nonspecific but compatible with chronic small vessel ischemic disease which is relatively mild for age. Mild cerebral atrophy is within normal limits for age. Vascular: Calcified atherosclerosis at the skull base. No hyperdense vessel. Skull: No acute fracture or suspicious osseous lesion. Sinuses/Orbits: Visualized paranasal sinuses are clear. Small right mastoid effusion. Bilateral cataract extraction. Other: None. ASPECTS [facility] Stroke Program Early CT Score) - Ganglionic level infarction (caudate, lentiform nuclei, internal capsule, insula, M1-M3 cortex): 5 - Supraganglionic infarction (M4-M6 cortex): 2 Total score (0-10 with 10 being normal): 7 These results were communicated to Dr. Selina Cooley at 11:02 am on 01/25/2023 by text page via the [facility] messaging system. 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 ASPECTS of 7. Electronically Signed   By: Sebastian Ache M.D.   On: 01/25/2023 11:03   CT Angio Chest Pulmonary Embolism (PE) W or WO Contrast  Result Date: 01/12/2023 CLINICAL DATA:  Respiratory failure EXAM: CT ANGIOGRAPHY CHEST WITH CONTRAST TECHNIQUE: Multidetector CT imaging of the chest was performed using the standard protocol during bolus administration of intravenous contrast. Multiplanar CT image reconstructions and MIPs were obtained to evaluate the vascular anatomy. RADIATION DOSE REDUCTION: This exam was performed according to the departmental dose-optimization program which includes automated exposure control, adjustment of the mA and/or kV according to patient size and/or use of iterative reconstruction technique. CONTRAST:  75mL OMNIPAQUE IOHEXOL 350 MG/ML SOLN COMPARISON:   11/06/2022 FINDINGS: Cardiovascular: Satisfactory opacification of the pulmonary arteries to the segmental level. No evidence of pulmonary embolism. Normal heart size. Three-vessel coronary artery calcifications. No pericardial effusion. Aortic atherosclerosis. Mediastinum/Nodes: Unchanged enlarged mediastinal and hilar lymph nodes, pretracheal nodes measuring up to 1.8 x 1.5 cm (series 4, image 61). Thyroid gland, trachea, and esophagus demonstrate no significant findings. Lungs/Pleura: Moderate centrilobular and paraseptal emphysema. Diffuse bilateral bronchial wall thickening. Small bilateral pleural effusions and associated atelectasis or consolidation, increased compared to prior examination. Scattered, consolidative airspace opacities throughout the lungs, increased compared to prior examination (series 6, image 89). Upper Abdomen: No acute abnormality. Musculoskeletal: No chest wall abnormality. No acute osseous findings. Review of the MIP images confirms the above findings. IMPRESSION: 1. Negative examination for pulmonary embolism. 2. Small bilateral pleural effusions and associated atelectasis or consolidation, increased compared to prior examination. 3. Scattered, consolidative airspace opacities throughout the lungs, increased compared to prior examination, consistent with worsened multifocal infection. 4. Unchanged enlarged mediastinal and hilar lymph nodes, likely reactive. 5. Emphysema and diffuse bilateral bronchial wall thickening. 6. Coronary artery disease. Aortic Atherosclerosis (ICD10-I70.0) and Emphysema (ICD10-J43.9). Electronically Signed   By: Jearld Lesch M.D.   On: 01/12/2023 18:49   DG Chest Port 1 View  Result Date: 01/10/2023 CLINICAL DATA:  Acute respiratory distress EXAM: PORTABLE CHEST 1 VIEW COMPARISON:  01/08/2023 FINDINGS: Cardiac shadow is stable. Aortic calcifications are noted. Increasing airspace opacities are noted in the bases bilaterally left greater than right when  compare with the prior exam. Mild central vascular congestion is noted. No bony abnormality is noted. IMPRESSION: Mild central vascular congestion. Increasing bibasilar airspace opacity left greater than right. Electronically Signed   By: Loraine Leriche  Lukens M.D.   On: 01/10/2023 18:43   CT Head Wo Contrast  Result Date: 01/08/2023 CLINICAL DATA:  fall EXAM: CT HEAD WITHOUT CONTRAST CT CERVICAL SPINE WITHOUT CONTRAST TECHNIQUE: Multidetector CT imaging of the head and cervical spine was performed following the standard protocol without intravenous contrast. Multiplanar CT image reconstructions of the cervical spine were also generated. RADIATION DOSE REDUCTION: This exam was performed according to the departmental dose-optimization program which includes automated exposure control, adjustment of the mA and/or kV according to patient size and/or use of iterative reconstruction technique. COMPARISON:  CT head and C Spine 10/31/22 FINDINGS: CT HEAD FINDINGS Brain: No hemorrhage. No hydrocephalus. No extra-axial fluid collection. No CT evidence of an acute cortical infarct. No mass effect. No mass lesion. Vascular: No hyperdense vessel or unexpected calcification. Skull: Normal. Negative for fracture or focal lesion. Sinuses/Orbits: Effusion. Trace right mastoid effusion. Bilateral lens replacement. Orbits are otherwise unremarkable. Paranasal sinuses clear. Other: None. CT CERVICAL SPINE FINDINGS Limitations: Motion degraded exam Alignment: Grade 1 anterolisthesis of C3 on C4. Mild retrolisthesis of C4 on C5 and C5 on C6. Skull base and vertebrae: No acute fracture. No primary bone lesion or focal pathologic process. Soft tissues and spinal canal: No prevertebral fluid or swelling. No visible canal hematoma. Disc levels:  Mild spinal canal narrowing at C4-C5 Upper chest: Biapical pleural-parenchymal scarring. Other: None IMPRESSION: 1. No acute intracranial abnormality. 2. No acute fracture or traumatic malalignment of  the cervical spine. 3. Mild spinal canal narrowing at C4-C5. Electronically Signed   By: Lorenza Cambridge M.D.   On: 01/08/2023 17:26   CT Cervical Spine Wo Contrast  Result Date: 01/08/2023 CLINICAL DATA:  fall EXAM: CT HEAD WITHOUT CONTRAST CT CERVICAL SPINE WITHOUT CONTRAST TECHNIQUE: Multidetector CT imaging of the head and cervical spine was performed following the standard protocol without intravenous contrast. Multiplanar CT image reconstructions of the cervical spine were also generated. RADIATION DOSE REDUCTION: This exam was performed according to the departmental dose-optimization program which includes automated exposure control, adjustment of the mA and/or kV according to patient size and/or use of iterative reconstruction technique. COMPARISON:  CT head and C Spine 10/31/22 FINDINGS: CT HEAD FINDINGS Brain: No hemorrhage. No hydrocephalus. No extra-axial fluid collection. No CT evidence of an acute cortical infarct. No mass effect. No mass lesion. Vascular: No hyperdense vessel or unexpected calcification. Skull: Normal. Negative for fracture or focal lesion. Sinuses/Orbits: Effusion. Trace right mastoid effusion. Bilateral lens replacement. Orbits are otherwise unremarkable. Paranasal sinuses clear. Other: None. CT CERVICAL SPINE FINDINGS Limitations: Motion degraded exam Alignment: Grade 1 anterolisthesis of C3 on C4. Mild retrolisthesis of C4 on C5 and C5 on C6. Skull base and vertebrae: No acute fracture. No primary bone lesion or focal pathologic process. Soft tissues and spinal canal: No prevertebral fluid or swelling. No visible canal hematoma. Disc levels:  Mild spinal canal narrowing at C4-C5 Upper chest: Biapical pleural-parenchymal scarring. Other: None IMPRESSION: 1. No acute intracranial abnormality. 2. No acute fracture or traumatic malalignment of the cervical spine. 3. Mild spinal canal narrowing at C4-C5. Electronically Signed   By: Lorenza Cambridge M.D.   On: 01/08/2023 17:26   DG  Chest Port 1 View  Result Date: 01/08/2023 CLINICAL DATA:  Concern for sepsis.  Hypoxia. EXAM: PORTABLE CHEST 1 VIEW COMPARISON:  Chest radiograph dated November 06, 2022. FINDINGS: Patient is rotated to the right. The heart size and mediastinal contours are within normal limits. Aortic calcification. Patchy left retrocardiac opacity. There is blunting of the  left costophrenic angle, which could represent a small effusion or atelectasis. No pneumothorax. Cortical irregularity of the distal left clavicle, appears new compared to the prior exam dated November 06, 2022. Remote healed left anterior rib fractures. IMPRESSION: 1. Patchy left retrocardiac opacity may be secondary to an infectious/inflammatory etiology. 2. Possible small left pleural effusion versus atelectasis. 3. Cortical irregularity of the distal left clavicle appears new compared to the prior exam dated October 29, 2022. A fracture can not be excluded. Recommend correlation with history and physical exam. Consider dedicated radiographs of the left clavicle for further evaluation. Electronically Signed   By: Hart Robinsons M.D.   On: 01/08/2023 15:28    Micro Results    Recent Results (from the past 240 hour(s))  Respiratory (~20 pathogens) panel by PCR     Status: None   Collection Time: 01/25/23  2:20 PM   Specimen: Anterior Nasal Swab; Respiratory  Result Value Ref Range Status   Adenovirus NOT DETECTED NOT DETECTED Final   Coronavirus 229E NOT DETECTED NOT DETECTED Final    Comment: (NOTE) The Coronavirus on the Respiratory Panel, DOES NOT test for the novel  Coronavirus (2019 nCoV)    Coronavirus HKU1 NOT DETECTED NOT DETECTED Final   Coronavirus NL63 NOT DETECTED NOT DETECTED Final   Coronavirus OC43 NOT DETECTED NOT DETECTED Final   Metapneumovirus NOT DETECTED NOT DETECTED Final   Rhinovirus / Enterovirus NOT DETECTED NOT DETECTED Final   Influenza A NOT DETECTED NOT DETECTED Final   Influenza B NOT DETECTED NOT DETECTED  Final   Parainfluenza Virus 1 NOT DETECTED NOT DETECTED Final   Parainfluenza Virus 2 NOT DETECTED NOT DETECTED Final   Parainfluenza Virus 3 NOT DETECTED NOT DETECTED Final   Parainfluenza Virus 4 NOT DETECTED NOT DETECTED Final   Respiratory Syncytial Virus NOT DETECTED NOT DETECTED Final   Bordetella pertussis NOT DETECTED NOT DETECTED Final   Bordetella Parapertussis NOT DETECTED NOT DETECTED Final   Chlamydophila pneumoniae NOT DETECTED NOT DETECTED Final   Mycoplasma pneumoniae NOT DETECTED NOT DETECTED Final    Comment: Performed at [facility], [address], [place], [place] [address]  SARS Coronavirus 2 by RT PCR (hospital order, performed in [facility] hospital lab) *cepheid single result test* Anterior Nasal Swab     Status: None   Collection Time: 01/25/23  2:20 PM   Specimen: Anterior Nasal Swab  Result Value Ref Range Status   SARS Coronavirus 2 by RT PCR NEGATIVE NEGATIVE Final    Comment: Performed at [facility], [address] [place], [place] [address]  MRSA Next Gen by PCR, Nasal     Status: Abnormal   Collection Time: 01/25/23  6:15 PM  Result Value Ref Range Status   MRSA by PCR Next Gen DETECTED (A) NOT DETECTED Final    Comment: RESULT CALLED TO, READ BACK BY AND VERIFIED WITH: RN VICTORIA JYEKYE ON 01/25/23 @ 2244 BY DRT (NOTE) The GeneXpert MRSA Assay (FDA approved for NASAL specimens only), is one component of a comprehensive MRSA colonization surveillance program. It is not intended to diagnose MRSA infection nor to guide or monitor treatment for MRSA infections. Test performance is not FDA approved in patients less than 29 years old. Performed at [facility], [address], [place], [place] [address]   Culture, blood (Routine X 2) w Reflex to ID Panel     Status: None (Preliminary result)   Collection Time: 01/25/23  7:11 PM  Specimen: BLOOD  Result Value Ref Range Status   Specimen Description BLOOD SITE NOT SPECIFIED  Final    Special Requests   Final    BOTTLES DRAWN AEROBIC AND ANAEROBIC Blood Culture adequate volume   Culture   Final    NO GROWTH 4 DAYS Performed at [facility], [address] [place], [place] [address]    Report Status PENDING  Incomplete  Culture, blood (Routine X 2) w Reflex to ID Panel     Status: None (Preliminary result)   Collection Time: 01/25/23  7:11 PM   Specimen: BLOOD  Result Value Ref Range Status   Specimen Description BLOOD SITE NOT SPECIFIED  Final   Special Requests   Final    BOTTLES DRAWN AEROBIC ONLY Blood Culture results may not be optimal due to an inadequate volume of blood received in culture bottles   Culture   Final    NO GROWTH 4 DAYS Performed at [facility], [address], [place], [place] [address]    Report Status PENDING  Incomplete    Today   Subjective    Samantha Carpenter today has no headache,no chest abdominal pain,no new weakness tingling or numbness, feels much better wants to go home today.    Objective   Blood pressure (!) 185/99, pulse 97, temperature 98.7 F (37.1 C), temperature source Oral, resp. rate 20, weight 61.2 kg, SpO2 97%.   Intake/Output Summary (Last 24 hours) at 01/29/2023 0910 Last data filed at 01/28/2023 1631 Gross per 24 hour  Intake --  Output 250 ml  Net -250 ml    Exam  Awake thoroughly confused, unable to answer questions reliably or follow commands, drooling from the side of her mouth, right-sided weakness, left arm in sling [place].AT,PERRAL Supple Neck,   Symmetrical Chest wall movement, Good air movement bilaterally, CTAB RRR,No Gallops,   +ve B.Sounds, Abd Soft, Non tender,  No Cyanosis, Clubbing or edema    Data Review   Recent Labs  Lab 01/25/23 1045 01/25/23 1052 01/26/23 0415 01/27/23 0445 01/28/23 0330 01/29/23 0307  WBC 8.6  --  10.2 8.2 8.6 6.3  HGB 10.6* 10.9* 11.1* 10.8* 10.4* 9.6*  HCT 34.6* 32.0* 34.5* 34.3* 34.2* 31.0*  PLT 337  --  356 305 298 261  MCV 100.6*  --   98.0 97.7 98.8 97.8  MCH 30.8  --  31.5 30.8 30.1 30.3  MCHC 30.6  --  32.2 31.5 30.4 31.0  RDW 13.7  --  13.6 13.6 13.9 13.8  LYMPHSABS 1.3  --   --  0.8 1.2 1.1  MONOABS 0.7  --   --  0.5 0.5 0.4  EOSABS 0.1  --   --  0.1 0.1 0.2  BASOSABS 0.1  --   --  0.0 0.0 0.0    Recent Labs  Lab 01/23/23 0512 01/24/23 0417 01/25/23 1045 01/25/23 1052 01/25/23 1648 01/25/23 1911 01/25/23 2325 01/26/23 0415 01/27/23 0445 01/27/23 0708 01/28/23 0330 01/29/23 0307  NA 143   < > 146* 145  --   --   --  146* 147*  --  146* 147*  K 3.9   < > 3.6 3.5  --   --   --  3.5 3.4*  --  3.8 3.3*  CL 98   < > 103 102  --   --   --  103 105  --  106 108  CO2 34*   < > 30  --   --   --   --  30 28  --  27 30  ANIONGAP 11   < > 13  --   --   --   --  13 14  --  13 9  GLUCOSE 157*   < > 167* 172*  --   --   --  138* 141*  --  164* 180*  BUN 37*   < > 29* 31*  --   --   --  29* 23  --  26* 22  CREATININE 1.41*   < > 1.36* 1.40*  --   --   --  1.41* 1.19*  --  1.28* 1.28*  AST  --   --  32  --   --   --   --   --   --   --   --   --   ALT  --   --  20  --   --   --   --   --   --   --   --   --   ALKPHOS  --   --  72  --   --   --   --   --   --   --   --   --   BILITOT  --   --  0.7  --   --   --   --   --   --   --   --   --   ALBUMIN  --   --  3.2*  --   --   --   --   --   --   --   --   --   CRP  --   --   --   --   --   --   --   --   --  8.1* 6.1* 4.2*  PROCALCITON  --   --   --   --  <0.10  --   --   --   --  <0.10 0.10 <0.10  LATICACIDVEN  --   --   --   --   --  1.3 1.2  --   --   --   --   --   INR  --   --  1.1  --   --   --   --   --   --   --   --   --   HGBA1C  --   --   --   --   --   --   --  6.6*  --   --   --   --   BNP  --   --   --   --   --   --   --   --  689.5*  --  563.2* 395.0*  MG 2.3  --   --   --   --   --   --   --  1.9  --  2.1 1.8  CALCIUM 9.0   < > 9.0  --   --   --   --  9.3 8.9  --  8.9 8.6*   < > = values in this interval not displayed.    Total Time in preparing  paper work, data evaluation and todays exam - 35 minutes  Signature  -    Susa Raring M.D on 01/29/2023 at 9:10 AM   -  To page go to [email or web address]

## 2023-01-30 DIAGNOSIS — I63512 Cerebral infarction due to unspecified occlusion or stenosis of left middle cerebral artery: Secondary | ICD-10-CM | POA: Diagnosis not present

## 2023-01-30 LAB — CULTURE, BLOOD (ROUTINE X 2)
Culture: NO GROWTH
Culture: NO GROWTH
Special Requests: ADEQUATE

## 2023-01-30 NOTE — TOC Transition Note (Signed)
Transition of Care [facility]) - CM/SW Discharge Note   Patient Details  Name: Stefane Grounds MRN: [id number] Date of Birth: 06/05/1940  [facility]) CM/SW Contact:  Mearl Latin, LCSW Phone Number: 01/30/2023, 9:38 AM   Clinical Narrative:    Patient will DC to: [facility] Anticipated DC date: 01/30/23 Family notified: Sister/POA Transport by: Sharin Mons   Per MD patient ready for DC to Hospice. RN to call report prior to discharge [phone]). RN, patient, patient's family, and facility notified of DC. Discharge Summary sent to facility. DC packet on chart including signed DNR. Ambulance transport requested for patient.   CSW will sign off for now as social work intervention is no longer needed. Please consult [place] again if new needs arise.     Final next level of care: Hospice Medical Facility Barriers to Discharge: Barriers Resolved   Patient Goals and CMS Choice      Discharge Placement                Patient chooses bed at:  [facility]) Patient to be transferred to facility by: PTAR Name of family member notified: PTAR Patient and family notified of of transfer: 01/30/23  Discharge Plan and Services Additional resources added to the After Visit Summary for   In-house Referral: Clinical Social Work                                   Social Determinants of Health (SDOH) Interventions SDOH Screenings   Food Insecurity: Patient Unable To Answer (01/11/2023)  Housing: Patient Unable To Answer (01/11/2023)  Transportation Needs: No Transportation Needs (11/07/2022)  Utilities: Not At Risk (11/07/2022)  Financial Resource Strain: Low Risk  (09/02/2018)  Physical Activity: Sufficiently Active (09/02/2018)  Social Connections: Unknown (09/02/2018)  Stress: No Stress Concern Present (09/02/2018)  Tobacco Use: Medium Risk (01/08/2023)     Readmission Risk Interventions    01/26/2023   12:06 PM 01/11/2022    2:05 PM  01/09/2022    2:23 PM  Readmission Risk Prevention Plan  Transportation Screening Complete  Complete  HRI or Home Care Consult  Complete   Palliative Care Screening   Not Applicable  Medication Review (RN Care Manager) Complete  Complete  PCP or Specialist appointment within 3-5 days of discharge Complete    HRI or Home Care Consult Complete    SW Recovery Care/Counseling Consult Complete    Palliative Care Screening Not Applicable    Skilled Nursing Facility Complete

## 2023-01-30 NOTE — Progress Notes (Signed)
PROGRESS NOTE                                                                                                                                                                                                             Patient Demographics:    Samantha Carpenter, is a 82 y.o. female, DOB - 1941-01-29, WJX:[id number]  Outpatient Primary MD for the patient is Pcp, No    LOS - 4  Admit date - 01/25/2023    Chief Complaint  Patient presents with   Code Stroke       Brief Narrative (HPI from H&P)    82 y.o. female with medical history significant of hypertension, hyperlipidemia, COPD, chronic diastolic congestive heart failure, CKD stage IIIa, dementia, and frequent falls who presented with complaints of difficulty speaking and right-sided weakness.  At baseline she could talk, walk without assistance although recommended to use walk, and care for herself.  She had just recently been hospitalized from 10/22-11/7 with acute hypoxic and hypercapnic respiratory failure thought secondary to COPD exacerbation and community acquired pneumonia.    She now presented to the hospital with high fevers, right-sided weakness, left-sided clavicular fracture, was diagnosed with pneumonia along with acute left MCA stroke.  She also developed severe encephalopathy and was admitted, patient is extremely weak, with dementia, very poor oral intake, she has been followed by palliative medicine, transition to full comfort measures   Subjective:    [name] with no significant events overnight as discussed with staff, she is obtundent, unable to provide any complaints.   Assessment  & Plan :     Stroke   Subacute.  Patient reportedly had been having difficulty with speech and right-sided weakness.  Symptoms had possibly been present prior to the patient going to rehab yesterday. 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  Seen by  stroke team, stroke workup per neurology, currently on aspirin, will place on low-dose statin for better LDL control, carotid ultrasound noted but patient is not an operative candidate, extremely weak and frail with dementia and severe delirium now.     She continued to have dysphagia and delirium, unable to eat food, poor candidate for tube feeds, had long discussion with patient's sister on 01/28/2023, she understood the poor state patient was then, she was also seen by palliative care.  She be  transition to comfort measures with hospice placement, goal of care is comfort only now.  Only comfort directed medications.     Sepsis due to pneumonia present on admission.  High suspicion for aspiration pneumonia due to new stroke, will switch to Unasyn for antibiotics, speech therapy following, aspiration precautions.  This problem has resolved but remains very high risk for repeat aspiration and repeat pneumonia.   Severe metabolic encephalopathy with underlying dementia.  Supportive care.  Minimize narcotics and benzodiazepines.  Involve palliative care for long-term goals of care.  EEG nonacute.   Dysphagia due to combination of stroke and encephalopathy.  Failed speech eval, kindly see above.   Dehydration and hypernatremia.  Hydrated here with IV fluids now on comfort care with hospice   COPD exacerbation No wheezing, supportive care.   Clavicle fracture secondary to fall Bilateral knee contusions Suspect prior to arrival.  On exam patient with bruising of the bilateral knees thought secondary to falls.  left arm placed in sling, not wait bearing in left arm.   Essential hypertension Now on comfort medications.   Chronic diastolic congestive heart failure EF 60% Chronic compensated   Chronic kidney disease stage IIIb Creatinine noted to be 1.36 with BUN 29.  Creatinine appears near patient's baseline.     Macrocytic anemia Chronic.  Hemoglobin 10.6 with MCV 100.6.  Hemoglobin appears  stable.      Dementia -Abortive care directed towards comfort   Prediabetes On admission glucose 172.  Last available hemoglobin A1c was 6.1.  Now on comfort care.      Condition -is comfort measures, being discharged to hospice at this morning     Procedures  :     EEG.  Nonacute.    Echocardiogram.  1. Left ventricular ejection fraction, by estimation, is 70 to 75%. The left ventricle has hyperdynamic function. The left ventricle has no regional wall motion abnormalities. Left ventricular diastolic function could not be evaluated.  2. Right ventricular systolic function is normal. The right ventricular size is normal.  3. The mitral valve is grossly normal. No evidence of mitral valve regurgitation.  4. The aortic valve has an indeterminant number of cusps. Aortic valve regurgitation is not visualized. No aortic stenosis is present.  5. The inferior vena cava is normal in size with greater than 50% respiratory variability, suggesting right atrial pressure of 3 mmHg. Comparison(s): The left ventricular function is unchanged.   Vascular duplex.    Right Carotid: Velocities in the right ICA are consistent with a 1-39% stenosis. Non-hemodynamically significant plaque <50% noted in the CCA. The ECA appears >50% stenosed.   Left Carotid: Velocities in the left ICA are consistent with a 1-39% stenosis. The ECA appears <50% stenosed. Vertebrals:  Bilateral vertebral arteries demonstrate antegrade flow. Subclavians: Normal flow hemodynamics were seen in bilateral subclavian arteries.  MRI -  1. Acute/subacute infarct in the posterior left MCA territory correlating with areas of hypodensity seen on recent head CT. No hemorrhagic transformation or significant mass effect. 2. Loss of flow related enhancement in the left MCA bifurcation may represent nonocclusive thrombus. 3. 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 4. Diffusely small caliber of the P1 and proximal P2 segment of the fetal  left PCA which remains patent. 5. Moderate chronic microvascular ischemic changes of the white matter.      Disposition Plan  :  SNF vs Hospice   DVT Prophylaxis  :       Lab Results  Component Value Date  PLT 261 01/29/2023    Diet :  Diet Order             Diet regular Room service appropriate? Yes; Fluid consistency: Thin  Diet effective now                    Inpatient Medications  Scheduled Meds:  mometasone-formoterol  2 puff Inhalation BID   OLANZapine zydis  5 mg Oral BID   polyethylene glycol  17 g Oral BID   Continuous Infusions:   PRN Meds:.acetaminophen **OR** acetaminophen, albuterol, antiseptic oral rinse, glycopyrrolate **OR** glycopyrrolate **OR** glycopyrrolate, haloperidol **OR** haloperidol **OR** haloperidol lactate, morphine injection, [DISCONTINUED] ondansetron **OR** ondansetron (ZOFRAN) IV, polyvinyl alcohol  Antibiotics  :    Anti-infectives (From admission, onward)    Start     Dose/Rate Route Frequency Ordered Stop   01/26/23 1800  Ampicillin-Sulbactam (UNASYN) 3 g in sodium chloride 0.9 % 100 mL IVPB  Status:  Discontinued        3 g 200 mL/hr over 30 Minutes Intravenous Every 12 hours 01/26/23 0856 01/29/23 0909   01/25/23 1600  cefTRIAXone (ROCEPHIN) 2 g in sodium chloride 0.9 % 100 mL IVPB  Status:  Discontinued        2 g 200 mL/hr over 30 Minutes Intravenous Every 24 hours 01/25/23 1547 01/26/23 0856   01/25/23 1600  azithromycin (ZITHROMAX) 500 mg in dextrose 5 % 250 mL IVPB  Status:  Discontinued        500 mg 250 mL/hr over 60 Minutes Intravenous Every 24 hours 01/25/23 1547 01/26/23 0856         Objective:   Vitals:   01/29/23 1600 01/30/23 0744 01/30/23 0839 01/30/23 0840  BP:   (!) 165/51   Pulse: 99  89   Resp:   (!) 28 (!) 22  Temp:   99.5 F (37.5 C)   TempSrc:   Axillary   SpO2: 100% 98% 96%   Weight:        Wt Readings from Last 3 Encounters:  01/25/23 61.2 kg  01/08/23 68 kg  11/06/22 67 kg      Intake/Output Summary (Last 24 hours) at 01/30/2023 0944 Last data filed at 01/29/2023 1525 Gross per 24 hour  Intake --  Output 250 ml  Net -250 ml     Physical Exam  Obtundent, ill-appearing, but in no apparent distress comfortable  Diminished air entry bilaterally  Abdomen soft  Chest with no edema       Data Review:    Recent Labs  Lab 01/25/23 1045 01/25/23 1052 01/26/23 0415 01/27/23 0445 01/28/23 0330 01/29/23 0307  WBC 8.6  --  10.2 8.2 8.6 6.3  HGB 10.6* 10.9* 11.1* 10.8* 10.4* 9.6*  HCT 34.6* 32.0* 34.5* 34.3* 34.2* 31.0*  PLT 337  --  356 305 298 261  MCV 100.6*  --  98.0 97.7 98.8 97.8  MCH 30.8  --  31.5 30.8 30.1 30.3  MCHC 30.6  --  32.2 31.5 30.4 31.0  RDW 13.7  --  13.6 13.6 13.9 13.8  LYMPHSABS 1.3  --   --  0.8 1.2 1.1  MONOABS 0.7  --   --  0.5 0.5 0.4  EOSABS 0.1  --   --  0.1 0.1 0.2  BASOSABS 0.1  --   --  0.0 0.0 0.0    Recent Labs  Lab 01/25/23 1045 01/25/23 1052 01/25/23 1648 01/25/23 1911 01/25/23 2325 01/26/23 0415 01/27/23 0445 01/27/23 0708 01/28/23  0330 01/29/23 0307  NA 146* 145  --   --   --  146* 147*  --  146* 147*  K 3.6 3.5  --   --   --  3.5 3.4*  --  3.8 3.3*  CL 103 102  --   --   --  103 105  --  106 108  CO2 30  --   --   --   --  30 28  --  27 30  ANIONGAP 13  --   --   --   --  13 14  --  13 9  GLUCOSE 167* 172*  --   --   --  138* 141*  --  164* 180*  BUN 29* 31*  --   --   --  29* 23  --  26* 22  CREATININE 1.36* 1.40*  --   --   --  1.41* 1.19*  --  1.28* 1.28*  AST 32  --   --   --   --   --   --   --   --   --   ALT 20  --   --   --   --   --   --   --   --   --   ALKPHOS 72  --   --   --   --   --   --   --   --   --   BILITOT 0.7  --   --   --   --   --   --   --   --   --   ALBUMIN 3.2*  --   --   --   --   --   --   --   --   --   CRP  --   --   --   --   --   --   --  8.1* 6.1* 4.2*  PROCALCITON  --   --  <0.10  --   --   --   --  <0.10 0.10 <0.10  LATICACIDVEN  --   --   --  1.3 1.2  --    --   --   --   --   INR 1.1  --   --   --   --   --   --   --   --   --   HGBA1C  --   --   --   --   --  6.6*  --   --   --   --   BNP  --   --   --   --   --   --  689.5*  --  563.2* 395.0*  MG  --   --   --   --   --   --  1.9  --  2.1 1.8  CALCIUM 9.0  --   --   --   --  9.3 8.9  --  8.9 8.6*      Recent Labs  Lab 01/25/23 1045 01/25/23 1648 01/25/23 1911 01/25/23 2325 01/26/23 0415 01/27/23 0445 01/27/23 0708 01/28/23 0330 01/29/23 0307  CRP  --   --   --   --   --   --  8.1* 6.1* 4.2*  PROCALCITON  --  <0.10  --   --   --   --  <0.10 0.10 <  0.10  LATICACIDVEN  --   --  1.3 1.2  --   --   --   --   --   INR 1.1  --   --   --   --   --   --   --   --   HGBA1C  --   --   --   --  6.6*  --   --   --   --   BNP  --   --   --   --   --  689.5*  --  563.2* 395.0*  MG  --   --   --   --   --  1.9  --  2.1 1.8  CALCIUM 9.0  --   --   --  9.3 8.9  --  8.9 8.6*    --------------------------------------------------------------------------------------------------------------- Lab Results  Component Value Date   CHOL 172 01/26/2023   HDL 66 01/26/2023   LDLCALC 78 01/26/2023   TRIG 138 01/26/2023   CHOLHDL 2.6 01/26/2023    Lab Results  Component Value Date   HGBA1C 6.6 (H) 01/26/2023      Micro Results Recent Results (from the past 240 hour(s))  Respiratory (~20 pathogens) panel by PCR     Status: None   Collection Time: 01/25/23  2:20 PM   Specimen: Anterior Nasal Swab; Respiratory  Result Value Ref Range Status   Adenovirus NOT DETECTED NOT DETECTED Final   Coronavirus 229E NOT DETECTED NOT DETECTED Final    Comment: (NOTE) The Coronavirus on the Respiratory Panel, DOES NOT test for the novel  Coronavirus (2019 nCoV)    Coronavirus HKU1 NOT DETECTED NOT DETECTED Final   Coronavirus NL63 NOT DETECTED NOT DETECTED Final   Coronavirus OC43 NOT DETECTED NOT DETECTED Final   Metapneumovirus NOT DETECTED NOT DETECTED Final   Rhinovirus / Enterovirus NOT DETECTED  NOT DETECTED Final   Influenza A NOT DETECTED NOT DETECTED Final   Influenza B NOT DETECTED NOT DETECTED Final   Parainfluenza Virus 1 NOT DETECTED NOT DETECTED Final   Parainfluenza Virus 2 NOT DETECTED NOT DETECTED Final   Parainfluenza Virus 3 NOT DETECTED NOT DETECTED Final   Parainfluenza Virus 4 NOT DETECTED NOT DETECTED Final   Respiratory Syncytial Virus NOT DETECTED NOT DETECTED Final   Bordetella pertussis NOT DETECTED NOT DETECTED Final   Bordetella Parapertussis NOT DETECTED NOT DETECTED Final   Chlamydophila pneumoniae NOT DETECTED NOT DETECTED Final   Mycoplasma pneumoniae NOT DETECTED NOT DETECTED Final    Comment: Performed at [facility], [address], [place], [place] [address]  SARS Coronavirus 2 by RT PCR (hospital order, performed in [facility] hospital lab) *cepheid single result test* Anterior Nasal Swab     Status: None   Collection Time: 01/25/23  2:20 PM   Specimen: Anterior Nasal Swab  Result Value Ref Range Status   SARS Coronavirus 2 by RT PCR NEGATIVE NEGATIVE Final    Comment: Performed at [facility], [address] [place], [place] [address]  MRSA Next Gen by PCR, Nasal     Status: Abnormal   Collection Time: 01/25/23  6:15 PM  Result Value Ref Range Status   MRSA by PCR Next Gen DETECTED (A) NOT DETECTED Final    Comment: RESULT CALLED TO, READ BACK BY AND VERIFIED WITH: RN VICTORIA JYEKYE ON 01/25/23 @ 2244 BY DRT (NOTE) The GeneXpert MRSA Assay (FDA approved for NASAL specimens only), is one component  of a comprehensive MRSA colonization surveillance program. It is not intended to diagnose MRSA infection nor to guide or monitor treatment for MRSA infections. Test performance is not FDA approved in patients less than 38 years old. Performed at [facility], [address], [place], [place] [address]   Culture, blood (Routine X 2) w Reflex to ID Panel     Status: None   Collection Time: 01/25/23  7:11 PM   Specimen: BLOOD   Result Value Ref Range Status   Specimen Description BLOOD SITE NOT SPECIFIED  Final   Special Requests   Final    BOTTLES DRAWN AEROBIC AND ANAEROBIC Blood Culture adequate volume   Culture   Final    NO GROWTH 5 DAYS Performed at [facility], [address] [place], [place] [address]    Report Status 01/30/2023 FINAL  Final  Culture, blood (Routine X 2) w Reflex to ID Panel     Status: None   Collection Time: 01/25/23  7:11 PM   Specimen: BLOOD  Result Value Ref Range Status   Specimen Description BLOOD SITE NOT SPECIFIED  Final   Special Requests   Final    BOTTLES DRAWN AEROBIC ONLY Blood Culture results may not be optimal due to an inadequate volume of blood received in culture bottles   Culture   Final    NO GROWTH 5 DAYS Performed at [facility], [address], [place], [place] [address]    Report Status 01/30/2023 FINAL  Final    Radiology Reports VAS [place] LOWER EXTREMITY VENOUS (DVT)  Result Date: 01/28/2023  Lower Venous DVT Study Patient Name:  ORETA THORNGREN  Date of Exam:   01/27/2023 Medical Rec #: [id number]      Accession #:    [id number] Date of Birth: 09/23/1940     Patient Gender: F Patient Age:   53 years Exam Location:  [facility] Procedure:      VAS [place] LOWER EXTREMITY VENOUS (DVT) Referring Phys: Scheryl Marten XU --------------------------------------------------------------------------------  Indications: Stroke.  Risk Factors: None identified. Limitations: Poor ultrasound/tissue interface and patient positioning, patient immobility. Comparison Study: No prior studies. Performing Technologist: Chanda Busing RVT  Examination Guidelines: A complete evaluation includes B-mode imaging, spectral Doppler, color Doppler, and power Doppler as needed of all accessible portions of each vessel. Bilateral testing is considered an integral part of a complete examination. Limited examinations for reoccurring indications may be performed as noted. The reflux  portion of the exam is performed with the patient in reverse Trendelenburg.  +---------+---------------+---------+-----------+----------+--------------+ RIGHT    CompressibilityPhasicitySpontaneityPropertiesThrombus Aging +---------+---------------+---------+-----------+----------+--------------+ CFV      Full           Yes      Yes                                 +---------+---------------+---------+-----------+----------+--------------+ SFJ      Full                                                        +---------+---------------+---------+-----------+----------+--------------+ FV Prox  Full                                                        +---------+---------------+---------+-----------+----------+--------------+  FV Mid   Full                                                        +---------+---------------+---------+-----------+----------+--------------+ FV DistalFull                                                        +---------+---------------+---------+-----------+----------+--------------+ PFV      Full                                                        +---------+---------------+---------+-----------+----------+--------------+ POP      Full           Yes      Yes                                 +---------+---------------+---------+-----------+----------+--------------+ PTV      Full                                                        +---------+---------------+---------+-----------+----------+--------------+ PERO     Full                                                        +---------+---------------+---------+-----------+----------+--------------+   +---------+---------------+---------+-----------+----------+-------------------+ LEFT     CompressibilityPhasicitySpontaneityPropertiesThrombus Aging      +---------+---------------+---------+-----------+----------+-------------------+ CFV      Full           Yes       Yes                                      +---------+---------------+---------+-----------+----------+-------------------+ SFJ      Full                                                             +---------+---------------+---------+-----------+----------+-------------------+ FV Prox  Full                                                             +---------+---------------+---------+-----------+----------+-------------------+ FV Mid   Full                                                             +---------+---------------+---------+-----------+----------+-------------------+  FV DistalFull                                                             +---------+---------------+---------+-----------+----------+-------------------+ PFV      Full                                                             +---------+---------------+---------+-----------+----------+-------------------+ POP      Full           Yes      Yes                                      +---------+---------------+---------+-----------+----------+-------------------+ PTV      Full                                                             +---------+---------------+---------+-----------+----------+-------------------+ PERO                                                  Not well visualized +---------+---------------+---------+-----------+----------+-------------------+     Summary: RIGHT: - There is no evidence of deep vein thrombosis in the lower extremity. However, portions of this examination were limited- see technologist comments above.  - No cystic structure found in the popliteal fossa.  LEFT: - There is no evidence of deep vein thrombosis in the lower extremity. However, portions of this examination were limited- see technologist comments above.  - No cystic structure found in the popliteal fossa.  *See table(s) above for measurements and observations. Electronically signed  by Carolynn Sayers on 01/28/2023 at 4:37:14 PM.    Final    DG Chest Port 1 View  Result Date: 01/27/2023 CLINICAL DATA:  Shortness of breath. EXAM: PORTABLE CHEST 1 VIEW COMPARISON:  January 26, 2023. FINDINGS: The heart size and mediastinal contours are within normal limits. Right lung is clear. Minimal left basilar subsegmental atelectasis or infiltrate is noted. The visualized skeletal structures are unremarkable. IMPRESSION: Minimal left basilar subsegmental atelectasis or infiltrate. Electronically Signed   By: Lupita Raider M.D.   On: 01/27/2023 08:03      Signature  -   Huey Bienenstock M.D on 01/30/2023 at 9:44 AM   -  To page go to [email or web address]

## 2023-01-30 NOTE — Progress Notes (Signed)
[facility] [phone] [facility] Liaison Note  Received request from [facility] manager for family interest in Hospice Home. Eligibility confirmed. Spoke with family to confirm interest and explain services. Family is agreeable to transfer today. TOC aware. RN please call report to [phone] prior to patient leaving the unit. Please send signed and completed DNR with patient at discharge.   Thank you for allowing [place] to participate in this patient's care.  Henderson Newcomer [facility] Liaison [phone]

## 2023-02-17 DEATH — deceased
